# Patient Record
Sex: Female | Born: 1955
Health system: Southern US, Community
[De-identification: ages and names within clinical notes are randomized; demographics above are authoritative.]

## PROBLEM LIST (undated history)

## (undated) DIAGNOSIS — K219 Gastro-esophageal reflux disease without esophagitis: Secondary | ICD-10-CM

## (undated) DIAGNOSIS — I10 Essential (primary) hypertension: Secondary | ICD-10-CM

## (undated) DIAGNOSIS — H9193 Unspecified hearing loss, bilateral: Secondary | ICD-10-CM

## (undated) DIAGNOSIS — G629 Polyneuropathy, unspecified: Secondary | ICD-10-CM

## (undated) DIAGNOSIS — H539 Unspecified visual disturbance: Secondary | ICD-10-CM

## (undated) DIAGNOSIS — O24419 Gestational diabetes mellitus in pregnancy, unspecified control: Secondary | ICD-10-CM

## (undated) DIAGNOSIS — M25552 Pain in left hip: Secondary | ICD-10-CM

## (undated) DIAGNOSIS — G8929 Other chronic pain: Secondary | ICD-10-CM

## (undated) HISTORY — DX: Gestational diabetes mellitus in pregnancy, unspecified control: O24.419

## (undated) HISTORY — DX: Unspecified hearing loss, bilateral: H91.93

## (undated) HISTORY — DX: Essential (primary) hypertension: I10

## (undated) HISTORY — DX: Unspecified visual disturbance: H53.9

## (undated) HISTORY — DX: Polyneuropathy, unspecified: G62.9

## (undated) HISTORY — DX: Pain in left hip: M25.552

## (undated) HISTORY — DX: Other chronic pain: G89.29

## (undated) HISTORY — DX: Gastro-esophageal reflux disease without esophagitis: K21.9

---

## 1990-11-15 HISTORY — PX: LAPAROSCOPIC CHOLECYSTECTOMY: SUR755

## 1999-12-01 ENCOUNTER — Encounter: Admission: RE | Admit: 1999-12-01 | Discharge: 1999-12-01 | Payer: Self-pay | Admitting: Obstetrics and Gynecology

## 1999-12-01 ENCOUNTER — Encounter: Payer: Self-pay | Admitting: Obstetrics and Gynecology

## 1999-12-10 ENCOUNTER — Encounter: Payer: Self-pay | Admitting: Obstetrics and Gynecology

## 1999-12-10 ENCOUNTER — Encounter: Admission: RE | Admit: 1999-12-10 | Discharge: 1999-12-10 | Payer: Self-pay | Admitting: Obstetrics and Gynecology

## 2000-06-16 ENCOUNTER — Encounter: Payer: Self-pay | Admitting: Obstetrics and Gynecology

## 2000-06-16 ENCOUNTER — Encounter: Admission: RE | Admit: 2000-06-16 | Discharge: 2000-06-16 | Payer: Self-pay | Admitting: Obstetrics and Gynecology

## 2001-02-15 ENCOUNTER — Encounter: Payer: Self-pay | Admitting: Obstetrics and Gynecology

## 2001-02-15 ENCOUNTER — Encounter: Admission: RE | Admit: 2001-02-15 | Discharge: 2001-02-15 | Payer: Self-pay | Admitting: Obstetrics and Gynecology

## 2002-10-15 ENCOUNTER — Encounter: Payer: Self-pay | Admitting: Emergency Medicine

## 2002-10-15 ENCOUNTER — Emergency Department (HOSPITAL_COMMUNITY): Admission: EM | Admit: 2002-10-15 | Discharge: 2002-10-15 | Payer: Self-pay | Admitting: *Deleted

## 2002-10-19 ENCOUNTER — Encounter: Payer: Self-pay | Admitting: Obstetrics and Gynecology

## 2002-10-19 ENCOUNTER — Encounter: Admission: RE | Admit: 2002-10-19 | Discharge: 2002-10-19 | Payer: Self-pay | Admitting: Obstetrics and Gynecology

## 2002-11-15 HISTORY — PX: REFRACTIVE SURGERY: SHX103

## 2006-06-07 ENCOUNTER — Encounter: Admission: RE | Admit: 2006-06-07 | Discharge: 2006-06-07 | Payer: Self-pay | Admitting: Obstetrics and Gynecology

## 2007-06-26 ENCOUNTER — Encounter: Admission: RE | Admit: 2007-06-26 | Discharge: 2007-06-26 | Payer: Self-pay | Admitting: Orthopaedic Surgery

## 2009-01-30 ENCOUNTER — Other Ambulatory Visit: Admission: RE | Admit: 2009-01-30 | Discharge: 2009-01-30 | Payer: Self-pay | Admitting: Family Medicine

## 2010-12-06 ENCOUNTER — Encounter: Payer: Self-pay | Admitting: Family Medicine

## 2013-01-04 ENCOUNTER — Other Ambulatory Visit (HOSPITAL_COMMUNITY)
Admission: RE | Admit: 2013-01-04 | Discharge: 2013-01-04 | Disposition: A | Discharge status: Home / Self Care | Payer: BC Managed Care – PPO | Source: Ambulatory Visit | Attending: Family Medicine | Admitting: Family Medicine

## 2013-01-04 ENCOUNTER — Other Ambulatory Visit: Payer: Self-pay | Admitting: Family Medicine

## 2013-01-04 DIAGNOSIS — Z0000 Encounter for general adult medical examination without abnormal findings: Secondary | ICD-10-CM | POA: Insufficient documentation

## 2014-07-29 ENCOUNTER — Other Ambulatory Visit: Payer: Self-pay

## 2014-07-29 DIAGNOSIS — Z1231 Encounter for screening mammogram for malignant neoplasm of breast: Secondary | ICD-10-CM

## 2014-08-13 ENCOUNTER — Ambulatory Visit
Admission: RE | Admit: 2014-08-13 | Discharge: 2014-08-13 | Disposition: A | Discharge status: Home / Self Care | Payer: PRIVATE HEALTH INSURANCE | Source: Ambulatory Visit

## 2014-08-13 DIAGNOSIS — Z1231 Encounter for screening mammogram for malignant neoplasm of breast: Secondary | ICD-10-CM

## 2016-05-02 ENCOUNTER — Encounter: Payer: Self-pay | Admitting: Family Medicine

## 2016-08-02 ENCOUNTER — Other Ambulatory Visit (HOSPITAL_COMMUNITY)
Admission: RE | Admit: 2016-08-02 | Discharge: 2016-08-02 | Disposition: A | Discharge status: Home / Self Care | Payer: 59 | Source: Ambulatory Visit | Attending: Family Medicine | Admitting: Family Medicine

## 2016-08-02 ENCOUNTER — Other Ambulatory Visit: Payer: Self-pay | Admitting: Family Medicine

## 2016-08-02 DIAGNOSIS — Z01419 Encounter for gynecological examination (general) (routine) without abnormal findings: Secondary | ICD-10-CM | POA: Diagnosis not present

## 2016-08-03 LAB — CYTOLOGY - PAP

## 2017-03-09 DIAGNOSIS — J019 Acute sinusitis, unspecified: Secondary | ICD-10-CM | POA: Diagnosis not present

## 2017-08-16 ENCOUNTER — Other Ambulatory Visit: Payer: Self-pay | Admitting: Family Medicine

## 2017-08-16 DIAGNOSIS — Z1231 Encounter for screening mammogram for malignant neoplasm of breast: Secondary | ICD-10-CM

## 2017-08-29 ENCOUNTER — Ambulatory Visit
Admission: RE | Admit: 2017-08-29 | Discharge: 2017-08-29 | Disposition: A | Discharge status: Home / Self Care | Payer: 59 | Source: Ambulatory Visit | Attending: Family Medicine | Admitting: Family Medicine

## 2017-08-29 DIAGNOSIS — Z1231 Encounter for screening mammogram for malignant neoplasm of breast: Secondary | ICD-10-CM

## 2017-08-31 DIAGNOSIS — Z0000 Encounter for general adult medical examination without abnormal findings: Secondary | ICD-10-CM | POA: Diagnosis not present

## 2017-08-31 DIAGNOSIS — E782 Mixed hyperlipidemia: Secondary | ICD-10-CM | POA: Diagnosis not present

## 2017-08-31 DIAGNOSIS — K219 Gastro-esophageal reflux disease without esophagitis: Secondary | ICD-10-CM | POA: Diagnosis not present

## 2017-08-31 DIAGNOSIS — I10 Essential (primary) hypertension: Secondary | ICD-10-CM | POA: Diagnosis not present

## 2017-08-31 DIAGNOSIS — Z23 Encounter for immunization: Secondary | ICD-10-CM | POA: Diagnosis not present

## 2017-11-22 DIAGNOSIS — J208 Acute bronchitis due to other specified organisms: Secondary | ICD-10-CM | POA: Diagnosis not present

## 2017-12-11 NOTE — Telephone Encounter (Signed)
This encounter was created in error - please disregard.

## 2017-12-19 DIAGNOSIS — M5416 Radiculopathy, lumbar region: Secondary | ICD-10-CM | POA: Diagnosis not present

## 2017-12-19 DIAGNOSIS — M9905 Segmental and somatic dysfunction of pelvic region: Secondary | ICD-10-CM | POA: Diagnosis not present

## 2017-12-19 DIAGNOSIS — M9903 Segmental and somatic dysfunction of lumbar region: Secondary | ICD-10-CM | POA: Diagnosis not present

## 2017-12-21 DIAGNOSIS — M9905 Segmental and somatic dysfunction of pelvic region: Secondary | ICD-10-CM | POA: Diagnosis not present

## 2017-12-21 DIAGNOSIS — M9903 Segmental and somatic dysfunction of lumbar region: Secondary | ICD-10-CM | POA: Diagnosis not present

## 2017-12-21 DIAGNOSIS — M5416 Radiculopathy, lumbar region: Secondary | ICD-10-CM | POA: Diagnosis not present

## 2018-01-11 DIAGNOSIS — M9905 Segmental and somatic dysfunction of pelvic region: Secondary | ICD-10-CM | POA: Diagnosis not present

## 2018-01-11 DIAGNOSIS — M9903 Segmental and somatic dysfunction of lumbar region: Secondary | ICD-10-CM | POA: Diagnosis not present

## 2018-01-11 DIAGNOSIS — M5416 Radiculopathy, lumbar region: Secondary | ICD-10-CM | POA: Diagnosis not present

## 2018-01-17 DIAGNOSIS — M9905 Segmental and somatic dysfunction of pelvic region: Secondary | ICD-10-CM | POA: Diagnosis not present

## 2018-01-17 DIAGNOSIS — M5416 Radiculopathy, lumbar region: Secondary | ICD-10-CM | POA: Diagnosis not present

## 2018-01-17 DIAGNOSIS — M9903 Segmental and somatic dysfunction of lumbar region: Secondary | ICD-10-CM | POA: Diagnosis not present

## 2018-01-19 DIAGNOSIS — M9903 Segmental and somatic dysfunction of lumbar region: Secondary | ICD-10-CM | POA: Diagnosis not present

## 2018-01-19 DIAGNOSIS — M5416 Radiculopathy, lumbar region: Secondary | ICD-10-CM | POA: Diagnosis not present

## 2018-01-19 DIAGNOSIS — M9905 Segmental and somatic dysfunction of pelvic region: Secondary | ICD-10-CM | POA: Diagnosis not present

## 2018-01-26 DIAGNOSIS — M5416 Radiculopathy, lumbar region: Secondary | ICD-10-CM | POA: Diagnosis not present

## 2018-01-26 DIAGNOSIS — M9905 Segmental and somatic dysfunction of pelvic region: Secondary | ICD-10-CM | POA: Diagnosis not present

## 2018-01-26 DIAGNOSIS — M9903 Segmental and somatic dysfunction of lumbar region: Secondary | ICD-10-CM | POA: Diagnosis not present

## 2018-01-26 DIAGNOSIS — M47816 Spondylosis without myelopathy or radiculopathy, lumbar region: Secondary | ICD-10-CM | POA: Diagnosis not present

## 2018-01-30 DIAGNOSIS — M9903 Segmental and somatic dysfunction of lumbar region: Secondary | ICD-10-CM | POA: Diagnosis not present

## 2018-01-30 DIAGNOSIS — M9905 Segmental and somatic dysfunction of pelvic region: Secondary | ICD-10-CM | POA: Diagnosis not present

## 2018-01-30 DIAGNOSIS — M5416 Radiculopathy, lumbar region: Secondary | ICD-10-CM | POA: Diagnosis not present

## 2018-01-31 DIAGNOSIS — M9905 Segmental and somatic dysfunction of pelvic region: Secondary | ICD-10-CM | POA: Diagnosis not present

## 2018-01-31 DIAGNOSIS — M9903 Segmental and somatic dysfunction of lumbar region: Secondary | ICD-10-CM | POA: Diagnosis not present

## 2018-01-31 DIAGNOSIS — M5416 Radiculopathy, lumbar region: Secondary | ICD-10-CM | POA: Diagnosis not present

## 2018-02-02 DIAGNOSIS — M5416 Radiculopathy, lumbar region: Secondary | ICD-10-CM | POA: Diagnosis not present

## 2018-02-02 DIAGNOSIS — M9903 Segmental and somatic dysfunction of lumbar region: Secondary | ICD-10-CM | POA: Diagnosis not present

## 2018-02-02 DIAGNOSIS — M9905 Segmental and somatic dysfunction of pelvic region: Secondary | ICD-10-CM | POA: Diagnosis not present

## 2018-02-06 DIAGNOSIS — M9903 Segmental and somatic dysfunction of lumbar region: Secondary | ICD-10-CM | POA: Diagnosis not present

## 2018-02-06 DIAGNOSIS — M9905 Segmental and somatic dysfunction of pelvic region: Secondary | ICD-10-CM | POA: Diagnosis not present

## 2018-02-06 DIAGNOSIS — M5416 Radiculopathy, lumbar region: Secondary | ICD-10-CM | POA: Diagnosis not present

## 2018-02-07 DIAGNOSIS — M9905 Segmental and somatic dysfunction of pelvic region: Secondary | ICD-10-CM | POA: Diagnosis not present

## 2018-02-07 DIAGNOSIS — M9903 Segmental and somatic dysfunction of lumbar region: Secondary | ICD-10-CM | POA: Diagnosis not present

## 2018-02-07 DIAGNOSIS — M5416 Radiculopathy, lumbar region: Secondary | ICD-10-CM | POA: Diagnosis not present

## 2018-02-09 DIAGNOSIS — M5416 Radiculopathy, lumbar region: Secondary | ICD-10-CM | POA: Diagnosis not present

## 2018-02-09 DIAGNOSIS — M9905 Segmental and somatic dysfunction of pelvic region: Secondary | ICD-10-CM | POA: Diagnosis not present

## 2018-02-09 DIAGNOSIS — M9903 Segmental and somatic dysfunction of lumbar region: Secondary | ICD-10-CM | POA: Diagnosis not present

## 2018-02-13 DIAGNOSIS — R29898 Other symptoms and signs involving the musculoskeletal system: Secondary | ICD-10-CM | POA: Diagnosis not present

## 2018-02-13 DIAGNOSIS — R202 Paresthesia of skin: Secondary | ICD-10-CM | POA: Diagnosis not present

## 2018-02-15 ENCOUNTER — Ambulatory Visit: Payer: 59 | Admitting: Neurology

## 2018-02-15 ENCOUNTER — Encounter: Payer: Self-pay | Admitting: Neurology

## 2018-02-15 VITALS — BP 131/92 | HR 89 | Resp 18 | Ht 64.0 in | Wt 207.5 lb

## 2018-02-15 DIAGNOSIS — R159 Full incontinence of feces: Secondary | ICD-10-CM | POA: Diagnosis not present

## 2018-02-15 DIAGNOSIS — R29898 Other symptoms and signs involving the musculoskeletal system: Secondary | ICD-10-CM | POA: Diagnosis not present

## 2018-02-15 DIAGNOSIS — R5383 Other fatigue: Secondary | ICD-10-CM | POA: Diagnosis not present

## 2018-02-15 DIAGNOSIS — R260 Ataxic gait: Secondary | ICD-10-CM | POA: Insufficient documentation

## 2018-02-15 DIAGNOSIS — R200 Anesthesia of skin: Secondary | ICD-10-CM | POA: Insufficient documentation

## 2018-02-15 DIAGNOSIS — G3281 Cerebellar ataxia in diseases classified elsewhere: Secondary | ICD-10-CM

## 2018-02-15 DIAGNOSIS — R32 Unspecified urinary incontinence: Secondary | ICD-10-CM | POA: Insufficient documentation

## 2018-02-15 MED ORDER — OXYBUTYNIN CHLORIDE 5 MG PO TABS: 5.0000 mg | ORAL_TABLET | Freq: Two times a day (BID) | ORAL | 5 refills | Status: DC

## 2018-02-15 NOTE — Progress Notes (Signed)
[facility]  PATIENT: Hannah Jefferson DOB: Feb 16, 1956  REFERRING DOCTOR OR PCP:  Carol Ada, MD SOURCE: patient, notes from Dr. Tamala Julian  _________________________________   HISTORICAL  CHIEF COMPLAINT:  Chief Complaint  Patient presents with  . Numbness    Numbess from the waist down, left leg worse than right leg.  Onset November 2018.  Left hip pain with prolonged standing. Has seen a chiropractor (Dr. Jola Baptist).  Sts. Dr. Jimmye Norman did x-rays and told her she has worsening scoliosis which is causing hip pain and numbness.     HISTORY OF PRESENT ILLNESS:  I had the pleasure of seeing your patient, Hannah Jefferson, and [facility] for neurologic consultation regarding her numbness.    She is a 62 year old woman who reports a long history of numbness in her left thigh.  She began to notice numbness lower in the left leg during November 2018.   She also would have pain in the left hip when she stood for long time.  She saw her chiropractor and had x-rays performed and was told that she had worsening scoliosis that was causing her hip pain and numbness.  Symptoms have progressed and she now has numbness from the waist down bilaterally but in the left leg worse than the right leg.   Besides the numbness, she also has noted a heavy sensation in her left leg with some weakness.  She is now having more trouble going up and down the stairs for the past 2 months.    The left has been worse but she has has lesser numbness and weakness in the right leg more recently as well.   She has numbness in the waist as well and feels there is a short transition zone from normal to numb at that level.   She is stumbling and has had a couple falls, once tripping and other times due to poor balance.   She has been using a cane off and on the past couple months.    She can't walk on grass without using the cane.  She notes fluctuating numbness in her hands if she uses  the computer a lot.     She has had hip pain which has been felt due to her scoliosis.   She has had this off/on x many years, since at least 2007  She has had urinary frequency, urgency and some incontinence the past year.    This can fluctuate a lot.   She has had nocturia the past year.     She has a history of diarrhea and has had fecal incontinence.  This started in 1992 after a cholecystectomy.   She denies any change in vision.    She reports a lot more fatigue the past few months.    She feels in a cognitive fog the past few months.    She feels down the last month as she is getting worse.       She denies any significant neurologic symptom lasting a few days to a few weeks in the past.    However, she has felt clumsier the past year.     She has a histoy of a left ankle injury and fracture at age 51 and notes some stiffness there.   She has felt some difficulty climbing stairs since then.     She saw Dr. Tamala Julian last week and had laboratory testing.  She reports that they lab tests were fine that she was able  to see in the portal and that the B12 was low normal.    REVIEW OF SYSTEMS: Constitutional: No fevers, chills, sweats, or change in appetite.   She notes fatigue. Eyes: No visual changes, double vision, eye pain Ear, nose and throat: No hearing loss, ear pain, nasal congestion, sore throat Cardiovascular: No chest pain, palpitations Respiratory: No shortness of breath at rest or with exertion.   No wheezes GastrointestinaI: She has had some diarrhea and occasional fecal incontinence Genitourinary:She notes frequency, nocturia and rare incontinence.. Musculoskeletal: No neck pain, back pain Integumentary: No rash, pruritus, skin lesions Neurological: as above Psychiatric: No depression at this time.  No anxiety Endocrine: No palpitations, diaphoresis, change in appetite, change in weigh or increased thirst Hematologic/Lymphatic: No anemia, purpura,  petechiae. Allergic/Immunologic: No itchy/runny eyes, nasal congestion, recent allergic reactions, rashes  ALLERGIES: No Known Allergies  HOME MEDICATIONS:  Current Outpatient Medications:  .  lisinopril-hydrochlorothiazide (PRINZIDE,ZESTORETIC) 20-12.5 MG tablet, TK 1 T PO QD, Disp: , Rfl: 0 .  omeprazole (PRILOSEC) 20 MG capsule, Take 20 mg by mouth 2 (two) times daily before a meal., Disp: , Rfl:  .  oxybutynin (DITROPAN) 5 MG tablet, Take 1 tablet (5 mg total) by mouth 2 (two) times daily., Disp: 60 tablet, Rfl: 5  PAST MEDICAL HISTORY: Past Medical History:  Diagnosis Date  . Hypertension   . Vision abnormalities     PAST SURGICAL HISTORY: Past Surgical History:  Procedure Laterality Date  . LAPAROSCOPIC CHOLECYSTECTOMY  1992  . REFRACTIVE SURGERY Bilateral 2004    FAMILY HISTORY: Family History  Problem Relation Age of Onset  . Dementia Mother   . COPD Mother   . Heart disease Father   . Thyroid nodules Sister   . Diabetes Mellitus II Sister   . Heart disease Brother   . Diabetes Mellitus II Brother   . Diabetes Mellitus II Brother   . Heart disease Brother     SOCIAL HISTORY:  Social History   Socioeconomic History  . Marital status: Married    Spouse name: Not on file  . Number of children: Not on file  . Years of education: Not on file  . Highest education level: Not on file  Occupational History  . Not on file  Social Needs  . Financial resource strain: Not on file  . Food insecurity:    Worry: Not on file    Inability: Not on file  . Transportation needs:    Medical: Not on file    Non-medical: Not on file  Tobacco Use  . Smoking status: Never Smoker  . Smokeless tobacco: Never Used  Substance and Sexual Activity  . Alcohol use: Never    Frequency: Never  . Drug use: Never  . Sexual activity: Not on file  Lifestyle  . Physical activity:    Days per week: Not on file    Minutes per session: Not on file  . Stress: Not on file   Relationships  . Social connections:    Talks on phone: Not on file    Gets together: Not on file    Attends religious service: Not on file    Active member of club or organization: Not on file    Attends meetings of clubs or organizations: Not on file    Relationship status: Not on file  . Intimate partner violence:    Fear of current or ex partner: Not on file    Emotionally abused: Not on file    Physically  abused: Not on file    Forced sexual activity: Not on file  Other Topics Concern  . Not on file  Social History Narrative  . Not on file     PHYSICAL EXAM  Vitals:   02/15/18 1456  BP: (!) 131/92  Pulse: 89  Resp: 18  Weight: 207 lb 8 oz (94.1 kg)  Height: 5\' 4"  (1.626 m)    Body mass index is 35.62 kg/m.   General: The patient is well-developed and well-nourished and in no acute distress  Eyes:  Funduscopic exam shows normal optic discs and retinal vessels.  Neck: The neck is supple, no carotid bruits are noted.  The neck is nontender.  Cardiovascular: The heart has a regular rate and rhythm with a normal S1 and S2. There were no murmurs, gallops or rubs. Lungs are clear to auscultation.  Skin: Extremities are without significant edema.  Musculoskeletal:  Back is nontender  Neurologic Exam  Mental status: The patient is alert and oriented x 3 at the time of the examination. The patient has apparent normal recent and remote memory, with an apparently normal attention span and concentration ability.   Speech is normal.  Cranial nerves: Extraocular movements are full. Pupils are equal, round, and reactive to light and accomodation.  Visual fields are full.  Facial symmetry is present. There is good facial sensation to soft touch bilaterally.Facial strength is normal.  Trapezius and sternocleidomastoid strength is normal. No dysarthria is noted.  The tongue is midline, and the patient has symmetric elevation of the soft palate. No obvious hearing deficits are  noted.  Motor:  Muscle bulk is normal.   There was slight increased muscle tone in the left leg.  Strength was 4/5 in the toe extensors on the left and 4+/5 in the ankle extensors.  Sensory: She has intact sensation to touch temperature and vibration in the arms.  She has reduced touch sensation in the legs, more so on the left than the right.  She has increased temperature sensation on the left versus the right.  Vibration sensation is slightly reduced at the left knee versus the right and more moderately reduced at the left great toe versus the right.   SHe has a sensory level around T10 -T11 in her back.  Coordination: Cerebellar testing reveals good finger-nose-finger and heel-to-shin bilaterally.  Gait and station: Station is normal.   Gait is mildly wide.  The tandem gait requires her to hold on in order to do.  The Romberg sign is negative..   Reflexes: Deep tendon reflexes slightly increased in the left arm relative to the right arm.  Deep tendon reflexes in the legs are increased bilaterally, left greater than right.  She has spread at the left knee.  She has 2 beats of nonsustained clonus at the left ankle.   Plantar responses are flexor.    DIAGNOSTIC DATA (LABS, IMAGING, TESTING) - I reviewed patient records, labs, notes, testing and imaging myself where available.      ASSESSMENT AND PLAN  Numbness - Plan: Copper, serum  Cerebellar ataxia in diseases classified elsewhere ([place]) - Plan: Copper, serum, MR BRAIN W WO CONTRAST, MR CERVICAL SPINE W WO CONTRAST, MR THORACIC SPINE W WO CONTRAST  Ataxic gait  Left leg weakness  Other fatigue  Urinary incontinence, unspecified type  Incontinence of feces, unspecified fecal incontinence type    In summary, Hannah Jefferson is a 62 year old woman who has had left greater than right leg numbness up to her  waist that has progressed over the past 5-6 months.  She also has had gait disturbance and left leg weakness.  Additionally over  the last year she has had worsening bladder and bowel function.  On her exam, she has a sensory level around T10 or T11, left leg weakness and an ataxic gait with hyperreflexia.  I am concerned about a spinal cord process such as transverse myelitis, multiple sclerosis or compressive myelopathy.  Her symptoms have progressed over time, rather than rapidly progressing over hours to days, we need to also be concerned about tumor.  We will check an MRI of the cervical and thoracic spine as well as the brain to rule out demyelination and compressive myelopathy.   This will also allow [place] to rule out ischemic and neoplastic etiology.  I will also check a copper level as copper deficiency can also lead to numbness and gait issues.  She has had B12 checked within the past week and she tells me that the level was low normal but not low.  Therefore, it is unlikely to be playing a role.  I will add oxybutynin to see if that helps the bladder and bowel issues.  She will return to see me in 2-3 weeks and call sooner if significant new or worsening symptoms.  Further evaluation will be considered based on the results of the imaging and laboratory studies.  Thank you for asking me to see Hannah Jefferson.  Please let me know if I can be of further assistance with her or other patients in the future.  Cira Deyoe A. Felecia Shelling, MD, PhD, FAAN Certified in Neurology, Clinical Neurophysiology, Sleep Medicine, Pain Medicine and Neuroimaging Director, [facility] [address], [place] [employer] [place], [place] [address] [phone]

## 2018-02-16 ENCOUNTER — Telehealth: Payer: Self-pay | Admitting: Neurology

## 2018-02-16 NOTE — Telephone Encounter (Signed)
MRI Brain w/wo MRI Cervical w/wo and Thoracic w/wo Dr. Felecia Shelling [facility] [phone], [phone], and [phone] (04/02/18). Patient is scheduled for 02/21/18 on [facility] mobile unit.

## 2018-02-17 LAB — COPPER, SERUM: Copper: 125 ug/dL (ref 72–166)

## 2018-02-20 ENCOUNTER — Telehealth: Payer: Self-pay | Admitting: *Deleted

## 2018-02-20 NOTE — Telephone Encounter (Signed)
Spoke with Judeen Hammans and reviewed below lab results.  She verbalized understanding of same, aware we will call with MRI results once it is done/fim

## 2018-02-20 NOTE — Telephone Encounter (Signed)
-----   Message from Britt Bottom, MD sent at 02/17/2018  4:58 PM EDT ----- Please let her know that the copper blood test was fine.  We will call with the results of the MRI after they were done.

## 2018-02-21 ENCOUNTER — Ambulatory Visit: Payer: 59

## 2018-02-21 ENCOUNTER — Telehealth: Payer: Self-pay | Admitting: Neurology

## 2018-02-21 DIAGNOSIS — R260 Ataxic gait: Secondary | ICD-10-CM | POA: Diagnosis not present

## 2018-02-21 DIAGNOSIS — G3281 Cerebellar ataxia in diseases classified elsewhere: Secondary | ICD-10-CM

## 2018-02-21 DIAGNOSIS — R200 Anesthesia of skin: Secondary | ICD-10-CM | POA: Diagnosis not present

## 2018-02-21 MED ORDER — DEXAMETHASONE 4 MG PO TABS: ORAL_TABLET | ORAL | 1 refills | Status: DC

## 2018-02-21 MED ORDER — GADOPENTETATE DIMEGLUMINE 469.01 MG/ML IV SOLN
19.0000 mL | Freq: Once | INTRAVENOUS | Status: AC | PRN
  Administered 2018-02-21: 19 mL via INTRAVENOUS

## 2018-02-21 NOTE — Telephone Encounter (Signed)
I spoke with Hannah Jefferson about the MRI of the thoracic spine showing a mass consistent with a meningioma at T7 and T8 causing spinal cord compression consistent with her symptoms.     She feels she is doing the same now as she was couple weeks ago.  I will call in a prescription for Decadron and try to get her into see neurosurgery as soon as they can

## 2018-02-22 NOTE — Telephone Encounter (Signed)
Received a phone call from [place] at [facility].  Pt. is scheduled for NS consult on 03/06/18 at 4pm/fim

## 2018-02-22 NOTE — Telephone Encounter (Signed)
Dr. Felecia Shelling spoke with Dr. Sherley Bounds at [facility] this morning.  Dr. Ronnald Ramp feels pt. can be seen in the next 2 wks.  I spoke with his new pt. coordinator Denny Peon) and faxed pt's. info, including demo and ins. info, ov note and MRI reports for brain, cervical and thoracic spine. to fax# [phone].  Leah sts. she will call pt. to schedule./fim

## 2018-02-22 NOTE — Telephone Encounter (Signed)
Pt. is aware of appt/fim

## 2018-03-01 ENCOUNTER — Ambulatory Visit: Payer: 59 | Admitting: Neurology

## 2018-03-06 DIAGNOSIS — I10 Essential (primary) hypertension: Secondary | ICD-10-CM | POA: Diagnosis not present

## 2018-03-06 DIAGNOSIS — D321 Benign neoplasm of spinal meninges: Secondary | ICD-10-CM | POA: Diagnosis not present

## 2018-03-07 DIAGNOSIS — S52532A Colles' fracture of left radius, initial encounter for closed fracture: Secondary | ICD-10-CM | POA: Diagnosis not present

## 2018-03-08 DIAGNOSIS — S52532A Colles' fracture of left radius, initial encounter for closed fracture: Secondary | ICD-10-CM | POA: Diagnosis not present

## 2018-03-08 DIAGNOSIS — S52572A Other intraarticular fracture of lower end of left radius, initial encounter for closed fracture: Secondary | ICD-10-CM | POA: Diagnosis not present

## 2018-03-08 DIAGNOSIS — M24532 Contracture, left wrist: Secondary | ICD-10-CM | POA: Diagnosis not present

## 2018-03-08 DIAGNOSIS — G8918 Other acute postprocedural pain: Secondary | ICD-10-CM | POA: Diagnosis not present

## 2018-03-09 DIAGNOSIS — I10 Essential (primary) hypertension: Secondary | ICD-10-CM | POA: Diagnosis not present

## 2018-03-09 DIAGNOSIS — D321 Benign neoplasm of spinal meninges: Secondary | ICD-10-CM | POA: Diagnosis not present

## 2018-03-09 DIAGNOSIS — R9431 Abnormal electrocardiogram [ECG] [EKG]: Secondary | ICD-10-CM | POA: Diagnosis not present

## 2018-03-09 DIAGNOSIS — I444 Left anterior fascicular block: Secondary | ICD-10-CM | POA: Diagnosis not present

## 2018-03-09 DIAGNOSIS — Z01818 Encounter for other preprocedural examination: Secondary | ICD-10-CM | POA: Diagnosis not present

## 2018-03-13 DIAGNOSIS — K449 Diaphragmatic hernia without obstruction or gangrene: Secondary | ICD-10-CM | POA: Diagnosis not present

## 2018-03-13 DIAGNOSIS — I82409 Acute embolism and thrombosis of unspecified deep veins of unspecified lower extremity: Secondary | ICD-10-CM | POA: Diagnosis not present

## 2018-03-13 DIAGNOSIS — Z431 Encounter for attention to gastrostomy: Secondary | ICD-10-CM | POA: Diagnosis not present

## 2018-03-13 DIAGNOSIS — M4714 Other spondylosis with myelopathy, thoracic region: Secondary | ICD-10-CM | POA: Diagnosis not present

## 2018-03-13 DIAGNOSIS — E876 Hypokalemia: Secondary | ICD-10-CM | POA: Diagnosis not present

## 2018-03-13 DIAGNOSIS — D321 Benign neoplasm of spinal meninges: Secondary | ICD-10-CM | POA: Diagnosis not present

## 2018-03-13 DIAGNOSIS — N319 Neuromuscular dysfunction of bladder, unspecified: Secondary | ICD-10-CM | POA: Diagnosis not present

## 2018-03-13 DIAGNOSIS — C720 Malignant neoplasm of spinal cord: Secondary | ICD-10-CM | POA: Diagnosis not present

## 2018-03-13 DIAGNOSIS — G9520 Unspecified cord compression: Secondary | ICD-10-CM | POA: Diagnosis not present

## 2018-03-13 DIAGNOSIS — A499 Bacterial infection, unspecified: Secondary | ICD-10-CM | POA: Diagnosis not present

## 2018-03-13 DIAGNOSIS — K9189 Other postprocedural complications and disorders of digestive system: Secondary | ICD-10-CM | POA: Diagnosis not present

## 2018-03-13 DIAGNOSIS — N390 Urinary tract infection, site not specified: Secondary | ICD-10-CM | POA: Diagnosis not present

## 2018-03-13 DIAGNOSIS — R140 Abdominal distension (gaseous): Secondary | ICD-10-CM | POA: Diagnosis not present

## 2018-03-13 DIAGNOSIS — Z743 Need for continuous supervision: Secondary | ICD-10-CM | POA: Diagnosis not present

## 2018-03-13 DIAGNOSIS — I82492 Acute embolism and thrombosis of other specified deep vein of left lower extremity: Secondary | ICD-10-CM | POA: Diagnosis not present

## 2018-03-13 DIAGNOSIS — D62 Acute posthemorrhagic anemia: Secondary | ICD-10-CM | POA: Diagnosis not present

## 2018-03-13 DIAGNOSIS — R2689 Other abnormalities of gait and mobility: Secondary | ICD-10-CM | POA: Diagnosis not present

## 2018-03-13 DIAGNOSIS — K219 Gastro-esophageal reflux disease without esophagitis: Secondary | ICD-10-CM | POA: Diagnosis not present

## 2018-03-13 DIAGNOSIS — G8220 Paraplegia, unspecified: Secondary | ICD-10-CM | POA: Diagnosis not present

## 2018-03-13 DIAGNOSIS — E871 Hypo-osmolality and hyponatremia: Secondary | ICD-10-CM | POA: Diagnosis not present

## 2018-03-13 DIAGNOSIS — R279 Unspecified lack of coordination: Secondary | ICD-10-CM | POA: Diagnosis not present

## 2018-03-13 DIAGNOSIS — K567 Ileus, unspecified: Secondary | ICD-10-CM | POA: Diagnosis not present

## 2018-03-13 DIAGNOSIS — K560 Paralytic ileus: Secondary | ICD-10-CM | POA: Diagnosis not present

## 2018-03-13 DIAGNOSIS — I82491 Acute embolism and thrombosis of other specified deep vein of right lower extremity: Secondary | ICD-10-CM | POA: Diagnosis not present

## 2018-03-13 DIAGNOSIS — G959 Disease of spinal cord, unspecified: Secondary | ICD-10-CM | POA: Diagnosis not present

## 2018-03-13 DIAGNOSIS — K592 Neurogenic bowel, not elsewhere classified: Secondary | ICD-10-CM | POA: Diagnosis not present

## 2018-03-13 DIAGNOSIS — G9519 Other vascular myelopathies: Secondary | ICD-10-CM | POA: Diagnosis not present

## 2018-03-13 DIAGNOSIS — S6292XA Unspecified fracture of left wrist and hand, initial encounter for closed fracture: Secondary | ICD-10-CM | POA: Diagnosis not present

## 2018-03-13 DIAGNOSIS — D497 Neoplasm of unspecified behavior of endocrine glands and other parts of nervous system: Secondary | ICD-10-CM | POA: Diagnosis not present

## 2018-03-13 DIAGNOSIS — I82411 Acute embolism and thrombosis of right femoral vein: Secondary | ICD-10-CM | POA: Diagnosis not present

## 2018-03-13 DIAGNOSIS — I82441 Acute embolism and thrombosis of right tibial vein: Secondary | ICD-10-CM | POA: Diagnosis not present

## 2018-03-13 DIAGNOSIS — S62102S Fracture of unspecified carpal bone, left wrist, sequela: Secondary | ICD-10-CM | POA: Diagnosis not present

## 2018-03-13 DIAGNOSIS — Z86718 Personal history of other venous thrombosis and embolism: Secondary | ICD-10-CM | POA: Diagnosis not present

## 2018-03-20 ENCOUNTER — Encounter: Payer: Self-pay | Admitting: *Deleted

## 2018-03-20 NOTE — PMR Pre-admission (Signed)
[facility] PMR Admission Coordinator Pre-Admission Assessment  Patient: Hannah Jefferson is an 62 y.o., female MRN: [id number] DOB: October 11, 1956 Height: 5' 4" (162.6 cm) Weight: 93.4 kg (206 lb)  Insurance Information HMO:     PPO:       PCP:       IPA:       80/20:       OTHER:  [employer] Plus PRIMARY: UHC      Policy#: [id number]      Subscriber:  [name] CM Name: Vevelyn Royals      Phone#: [phone]     Fax#: [phone] Pre-Cert#: [id number] with update on 7th day     Employer:  Office manager Benefits:  Phone #: [phone]     Name:  On line Eff. Date: 11/15/17     Deduct:  $3500 (met $3500)      Out of Pocket Max: $5000 (met $3900.25)      Life Max: N/A CIR: $500 per admission and then 80%      SNF: 80% with 60 days max Outpatient: 20 visit limit     Co-Pay: $25/visit Home Health: 80%      Co-Pay: 20% DME: 80%     Co-Pay: 20% Providers: in network  Medicaid Application Date:       Case Manager:  Disability Application Date:       Case Worker:   Emergency Contact Information Contact Information    Name Relation Home Work Mobile   [place] J Spouse   Faith, Onley Relative [phone]  [phone]      Current Medical History  Patient Admitting Diagnosis: 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 Sherley Bounds.  Patient reported numbness in lower extremities since 10/18 worse on the left.  She reported that numbness from waist down has gotten worse; legs felt heavy; difficulty walking; balance issues and weakness.  She fell 03/05/18 and broke her wrist; underwent ORIF by Dr. Caralyn Guile.  She underwent T7-8 laminectomies for removal of intradural extramedullary tumor on 03/13/18.  She developed a post op ileus and had an NG tube.  Peroneal DVT was noted on 03/16/18 and Lovenox was  started.  She failed voiding trial and required replacement of foley catheter.  PT/OT evaluations were completed with ongoing therapies and recommendations for inpatient rehab admission.  Patient to be admitted today for acute inpatient rehab program.  Patient's medical record from  [facility] has been reviewed by the rehabilitation admission coordinator and physician.  Past Medical History  Past Medical History:  Diagnosis Date  . Hypertension   . Vision abnormalities     Family History   family history includes COPD in her mother; Dementia in her mother; Diabetes Mellitus II in her brother, brother, and sister; Heart disease in her brother, brother, and father; Thyroid nodules in her sister.  Prior Rehab/Hospitalizations Has the patient had major surgery during 100 days prior to admission? Yes.  Patient had a fall resulting in a left wrist fracture and underwent ORIF to left wrist recently.   Current Medications See MAR from [facility]  Patients Current Diet:  Regular diet, thin liquids  Precautions / Restrictions Precautions Precautions: Fall Precautions/Special Needs: Weight Bearing Status Precaution Comments: LUE NWB in splint Restrictions Weight Bearing Restrictions: Yes LUE Weight Bearing: Non weight bearing  Has the patient had 2 or more falls or a fall with injury in the past year?Yes Reports at least five falls with most recent fall resulting in left wrist fracture.   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  Went out daily.  Prior Functional Level Self Care: Did the patient need help bathing, dressing, using the toilet or eating?  Independent  Indoor Mobility: Did the patient need assistance with walking from room to room (with or without device)? Independent  Stairs: Did the patient need assistance with internal or external stairs (with or without device)? Independent  Functional  Cognition: Did the patient need help planning regular tasks such as shopping or remembering to take medications? [place] Devices/Equipment: Cane (specify quad or straight), Wheelchair, Other (Comment)(Shower chair.)  Prior Device Use: Indicate devices/aids used by the patient prior to current illness, exacerbation or injury? Manual wheelchair and Cane   Prior Functional Level Current Functional Level  Bed Mobility  Independent  Max assist(+2 assist)   Transfers  Independent  Max assist(+2 assist)   Mobility - Walk/Wheelchair  Independent  Max assist(+2 assist.)   Upper Body Dressing  Independent  Mod assist   Lower Body Dressing  Independent  Total assist   Grooming  Independent  Min assist   Eating/Drinking  Independent  Other(Set up)   Toilet Transfer  Independent  Total assist(+2 assist)   Bladder Continence   Incontinence at times  Urinary retention, incontinence, catheter removed 03/20/18   Bowel Management  WDL  Loose stools all night 03/19/18 into am 03/20/18   Stair Climbing     Other(Not tried.)   Communication  WDL  Verbal   Memory  WDL  WDL   Cooking/Meal Prep  Independent      Housework  Independent    Money Management  Independent    Driving         Special needs/care consideration BiPAP/CPAP No CPM No Continuous Drip IV No Dialysis No     Life Vest No Oxygen No Special Bed No Trach Size No Wound Vac (area) No     Skin Has a cast from left hand to above left elbow; incision with dressing posterior thoracic post op area; has 2 skin tears on back with are dressed                             Bowel mgmt: Last BM loose on 03/20/18 Bladder mgmt: Urinary retention, catheter out today, may need in and out caths Diabetic mgmt No  Previous Home Environment Living Arrangements: Spouse/significant other  Lives With: Spouse Available Help at Discharge: Family, Friend(s),  Available 24 hours/day Type of Home: House Home Layout: Two level, Able to live on main level with bedroom/bathroom Alternate Level Stairs-Number of Steps: Flight to lower level apartment where nephew lives Home Access: Ramped entrance [employer] Shower/Tub: Gaffer, Door [employer] Toilet: Standard Bathroom Accessibility: Yes How Accessible: Accessible via walker  Discharge Living Setting Plans for Discharge Living Setting: House, Lives with (comment)(Lives with husband.) Type of Home at Discharge: House Discharge Home Layout: Two level, Able to live on main level with bedroom/bathroom Alternate Level Stairs-Number of Steps: Flight to apartment on lower level where nephew lives. Discharge Home Access: Ramped entrance Discharge Bathroom Shower/Tub: Walk-in shower, Door Discharge Bathroom Toilet: Standard Discharge Bathroom Accessibility: Yes How Accessible: Accessible via walker Does the patient have any  problems obtaining your medications?: No  Social/Family/Support Systems Patient Roles: Spouse, Other (Comment)(Has husband and sister-in-law.) Contact Information: Ulice Brilliant - sister in law Anticipated Caregiver: Husband and sister in law Anticipated Caregiver's Contact Information: [name] - sister in law - (c) [phone] Ability/Limitations of Caregiver: Husband works days.  [name] can assist during the day as she is retired.  Has friends who can also assist while husband works. Caregiver Availability: 24/7 Discharge Plan Discussed with Primary Caregiver: Yes Is Caregiver In Agreement with Plan?: Yes Does Caregiver/Family have Issues with Lodging/Transportation while Pt is in Rehab?: No  Goals/Additional Needs Patient/Family Goal for Rehab: PT/OT supervision to min assist goals Expected length of stay: 15-19 days Cultural Considerations: [place] Dietary Needs: Regular diet, thin liquids Equipment Needs: TBD Pt/Family Agrees to Admission and willing to participate: Yes Program  Orientation Provided & Reviewed with Pt/Caregiver Including Roles  & Responsibilities: Yes  Barriers to Discharge: Decreased caregiver support, Neurogenic Bowel & Bladder, Weight bearing restrictions  Patient Condition: I have reviewed all clinical records [facility] and I have talked with patient by way of her sister-in-law.  Patient can tolerate and benefit from 3 hours of therapy a day.  Patient needs the coordiated approach of the rehab team in order to regain loss of function and regain her independence.  I have shared all information with rehab MD and have approval for acute inpatient rehab admission for today.  Preadmission Screen Completed By:  Retta Diones, 03/20/2018 12:17 PM ______________________________________________________________________   Discussed status with Dr. Naaman Plummer on 03/21/18 at 1210 and received telephone approval for admission today.  Admission Coordinator:  Retta Diones, time 1210/Date 03/21/18   Assessment/Plan: Diagnosis:T7-8 tumor, wrist fracture 1. Does the need for close, 24 hr/day  Medical supervision in concert with the patient's rehab needs make it unreasonable for this patient to be served in a less intensive setting? Yes 2. Co-Morbidities requiring supervision/potential complications: pain, wound care 3. Due to bladder management, bowel management, safety, skin/wound care, disease management, medication administration, pain management and patient education, does the patient require 24 hr/day rehab nursing? Yes 4. Does the patient require coordinated care of a physician, rehab nurse, PT (1-2 hrs/day, 5 days/week) and OT (1-2 hrs/day, 5 days/week) to address physical and functional deficits in the context of the above medical diagnosis(es)? Yes Addressing deficits in the following areas: balance, endurance, locomotion, strength, transferring, bowel/bladder control, bathing, dressing, feeding, grooming, toileting and psychosocial support 5. Can the patient actively  participate in an intensive therapy program of at least 3 hrs of therapy 5 days a week? Yes 6. The potential for patient to make measurable gains while on inpatient rehab is excellent 7. Anticipated functional outcomes upon discharge from inpatients are: supervision and min assist PT, supervision and min assist OT, n/a SLP 8. Estimated rehab length of stay to reach the above functional goals is: 15-19 days 9. Does the patient have adequate social supports to accommodate these discharge functional goals? Yes 10. Anticipated D/C setting: Home 11. Anticipated post D/C treatments: HH therapy and Outpatient therapy 12. Overall Rehab/Functional Prognosis: excellent    RECOMMENDATIONS: This patient's condition is appropriate for continued rehabilitative care in the following setting: CIR Patient has agreed to participate in recommended program. Yes Note that insurance prior authorization may be required for reimbursement for recommended care.  Comment: Admit to inpatient rehab today  Meredith Staggers, MD, [facility] 03/21/2018   Retta Diones 03/20/2018

## 2018-03-21 ENCOUNTER — Inpatient Hospital Stay (HOSPITAL_COMMUNITY): Payer: 59

## 2018-03-21 ENCOUNTER — Other Ambulatory Visit (HOSPITAL_COMMUNITY): Payer: Self-pay | Admitting: Physical Medicine and Rehabilitation

## 2018-03-21 ENCOUNTER — Inpatient Hospital Stay (HOSPITAL_COMMUNITY)
Admission: RE | Admit: 2018-03-21 | Discharge: 2018-04-28 | DRG: 041 | Disposition: A | Discharge status: Skilled Nursing Facility | Payer: 59 | Source: Other Acute Inpatient Hospital | Attending: Physical Medicine & Rehabilitation | Admitting: Physical Medicine & Rehabilitation

## 2018-03-21 ENCOUNTER — Encounter (HOSPITAL_COMMUNITY): Payer: Self-pay | Admitting: Physical Medicine and Rehabilitation

## 2018-03-21 DIAGNOSIS — R1084 Generalized abdominal pain: Secondary | ICD-10-CM | POA: Diagnosis not present

## 2018-03-21 DIAGNOSIS — R112 Nausea with vomiting, unspecified: Secondary | ICD-10-CM | POA: Diagnosis not present

## 2018-03-21 DIAGNOSIS — B9620 Unspecified Escherichia coli [E. coli] as the cause of diseases classified elsewhere: Secondary | ICD-10-CM | POA: Diagnosis present

## 2018-03-21 DIAGNOSIS — K56609 Unspecified intestinal obstruction, unspecified as to partial versus complete obstruction: Secondary | ICD-10-CM

## 2018-03-21 DIAGNOSIS — R933 Abnormal findings on diagnostic imaging of other parts of digestive tract: Secondary | ICD-10-CM | POA: Diagnosis not present

## 2018-03-21 DIAGNOSIS — I82491 Acute embolism and thrombosis of other specified deep vein of right lower extremity: Secondary | ICD-10-CM | POA: Diagnosis present

## 2018-03-21 DIAGNOSIS — D62 Acute posthemorrhagic anemia: Secondary | ICD-10-CM

## 2018-03-21 DIAGNOSIS — K560 Paralytic ileus: Secondary | ICD-10-CM | POA: Diagnosis present

## 2018-03-21 DIAGNOSIS — R5383 Other fatigue: Secondary | ICD-10-CM | POA: Diagnosis present

## 2018-03-21 DIAGNOSIS — H9193 Unspecified hearing loss, bilateral: Secondary | ICD-10-CM | POA: Diagnosis present

## 2018-03-21 DIAGNOSIS — E876 Hypokalemia: Secondary | ICD-10-CM | POA: Diagnosis not present

## 2018-03-21 DIAGNOSIS — R2689 Other abnormalities of gait and mobility: Secondary | ICD-10-CM | POA: Diagnosis not present

## 2018-03-21 DIAGNOSIS — I10 Essential (primary) hypertension: Secondary | ICD-10-CM | POA: Diagnosis present

## 2018-03-21 DIAGNOSIS — M4714 Other spondylosis with myelopathy, thoracic region: Secondary | ICD-10-CM | POA: Diagnosis not present

## 2018-03-21 DIAGNOSIS — K219 Gastro-esophageal reflux disease without esophagitis: Secondary | ICD-10-CM | POA: Diagnosis present

## 2018-03-21 DIAGNOSIS — R279 Unspecified lack of coordination: Secondary | ICD-10-CM | POA: Diagnosis not present

## 2018-03-21 DIAGNOSIS — I82409 Acute embolism and thrombosis of unspecified deep veins of unspecified lower extremity: Secondary | ICD-10-CM | POA: Diagnosis not present

## 2018-03-21 DIAGNOSIS — S62102S Fracture of unspecified carpal bone, left wrist, sequela: Secondary | ICD-10-CM

## 2018-03-21 DIAGNOSIS — R159 Full incontinence of feces: Secondary | ICD-10-CM | POA: Diagnosis not present

## 2018-03-21 DIAGNOSIS — G992 Myelopathy in diseases classified elsewhere: Secondary | ICD-10-CM | POA: Diagnosis not present

## 2018-03-21 DIAGNOSIS — G959 Disease of spinal cord, unspecified: Secondary | ICD-10-CM | POA: Diagnosis present

## 2018-03-21 DIAGNOSIS — M2550 Pain in unspecified joint: Secondary | ICD-10-CM | POA: Diagnosis not present

## 2018-03-21 DIAGNOSIS — S52612K Displaced fracture of left ulna styloid process, subsequent encounter for closed fracture with nonunion: Secondary | ICD-10-CM | POA: Diagnosis not present

## 2018-03-21 DIAGNOSIS — D696 Thrombocytopenia, unspecified: Secondary | ICD-10-CM | POA: Diagnosis not present

## 2018-03-21 DIAGNOSIS — G8220 Paraplegia, unspecified: Secondary | ICD-10-CM | POA: Diagnosis present

## 2018-03-21 DIAGNOSIS — N390 Urinary tract infection, site not specified: Secondary | ICD-10-CM | POA: Diagnosis not present

## 2018-03-21 DIAGNOSIS — Z833 Family history of diabetes mellitus: Secondary | ICD-10-CM | POA: Diagnosis not present

## 2018-03-21 DIAGNOSIS — E871 Hypo-osmolality and hyponatremia: Secondary | ICD-10-CM | POA: Diagnosis not present

## 2018-03-21 DIAGNOSIS — R52 Pain, unspecified: Secondary | ICD-10-CM

## 2018-03-21 DIAGNOSIS — Z86718 Personal history of other venous thrombosis and embolism: Secondary | ICD-10-CM

## 2018-03-21 DIAGNOSIS — Z743 Need for continuous supervision: Secondary | ICD-10-CM | POA: Diagnosis not present

## 2018-03-21 DIAGNOSIS — R079 Chest pain, unspecified: Secondary | ICD-10-CM

## 2018-03-21 DIAGNOSIS — K592 Neurogenic bowel, not elsewhere classified: Secondary | ICD-10-CM

## 2018-03-21 DIAGNOSIS — K449 Diaphragmatic hernia without obstruction or gangrene: Secondary | ICD-10-CM | POA: Diagnosis not present

## 2018-03-21 DIAGNOSIS — N319 Neuromuscular dysfunction of bladder, unspecified: Secondary | ICD-10-CM | POA: Diagnosis not present

## 2018-03-21 DIAGNOSIS — E1142 Type 2 diabetes mellitus with diabetic polyneuropathy: Secondary | ICD-10-CM | POA: Diagnosis present

## 2018-03-21 DIAGNOSIS — K9189 Other postprocedural complications and disorders of digestive system: Secondary | ICD-10-CM | POA: Diagnosis not present

## 2018-03-21 DIAGNOSIS — S52352D Displaced comminuted fracture of shaft of radius, left arm, subsequent encounter for closed fracture with routine healing: Secondary | ICD-10-CM | POA: Diagnosis not present

## 2018-03-21 DIAGNOSIS — I82441 Acute embolism and thrombosis of right tibial vein: Secondary | ICD-10-CM | POA: Diagnosis present

## 2018-03-21 DIAGNOSIS — R140 Abdominal distension (gaseous): Secondary | ICD-10-CM | POA: Diagnosis not present

## 2018-03-21 DIAGNOSIS — Z7901 Long term (current) use of anticoagulants: Secondary | ICD-10-CM

## 2018-03-21 DIAGNOSIS — S52352S Displaced comminuted fracture of shaft of radius, left arm, sequela: Secondary | ICD-10-CM | POA: Diagnosis not present

## 2018-03-21 DIAGNOSIS — K567 Ileus, unspecified: Secondary | ICD-10-CM

## 2018-03-21 DIAGNOSIS — F329 Major depressive disorder, single episode, unspecified: Secondary | ICD-10-CM | POA: Diagnosis present

## 2018-03-21 DIAGNOSIS — R55 Syncope and collapse: Secondary | ICD-10-CM | POA: Diagnosis not present

## 2018-03-21 DIAGNOSIS — I82492 Acute embolism and thrombosis of other specified deep vein of left lower extremity: Secondary | ICD-10-CM

## 2018-03-21 DIAGNOSIS — Z452 Encounter for adjustment and management of vascular access device: Secondary | ICD-10-CM | POA: Diagnosis not present

## 2018-03-21 DIAGNOSIS — Z9049 Acquired absence of other specified parts of digestive tract: Secondary | ICD-10-CM

## 2018-03-21 DIAGNOSIS — Z09 Encounter for follow-up examination after completed treatment for conditions other than malignant neoplasm: Secondary | ICD-10-CM

## 2018-03-21 DIAGNOSIS — I739 Peripheral vascular disease, unspecified: Secondary | ICD-10-CM | POA: Diagnosis not present

## 2018-03-21 DIAGNOSIS — R109 Unspecified abdominal pain: Secondary | ICD-10-CM | POA: Diagnosis not present

## 2018-03-21 DIAGNOSIS — R143 Flatulence: Secondary | ICD-10-CM | POA: Diagnosis not present

## 2018-03-21 DIAGNOSIS — S52502A Unspecified fracture of the lower end of left radius, initial encounter for closed fracture: Secondary | ICD-10-CM

## 2018-03-21 DIAGNOSIS — O2230 Deep phlebothrombosis in pregnancy, unspecified trimester: Secondary | ICD-10-CM

## 2018-03-21 DIAGNOSIS — Z4682 Encounter for fitting and adjustment of non-vascular catheter: Secondary | ICD-10-CM | POA: Diagnosis not present

## 2018-03-21 DIAGNOSIS — S52592A Other fractures of lower end of left radius, initial encounter for closed fracture: Secondary | ICD-10-CM | POA: Diagnosis not present

## 2018-03-21 DIAGNOSIS — Z7401 Bed confinement status: Secondary | ICD-10-CM | POA: Diagnosis not present

## 2018-03-21 DIAGNOSIS — I82411 Acute embolism and thrombosis of right femoral vein: Secondary | ICD-10-CM | POA: Diagnosis not present

## 2018-03-21 DIAGNOSIS — I2699 Other pulmonary embolism without acute cor pulmonale: Secondary | ICD-10-CM | POA: Diagnosis not present

## 2018-03-21 DIAGNOSIS — A499 Bacterial infection, unspecified: Secondary | ICD-10-CM | POA: Diagnosis not present

## 2018-03-21 DIAGNOSIS — F419 Anxiety disorder, unspecified: Secondary | ICD-10-CM | POA: Diagnosis present

## 2018-03-21 LAB — URINALYSIS, ROUTINE W REFLEX MICROSCOPIC
Bilirubin Urine: NEGATIVE
Glucose, UA: NEGATIVE mg/dL
Ketones, ur: NEGATIVE mg/dL
Nitrite: POSITIVE — AB
Protein, ur: NEGATIVE mg/dL
Specific Gravity, Urine: 1.017 (ref 1.005–1.030)
pH: 5 (ref 5.0–8.0)

## 2018-03-21 LAB — MRSA PCR SCREENING: MRSA by PCR: NEGATIVE

## 2018-03-21 MED ORDER — HYDROCODONE-ACETAMINOPHEN 5-325 MG PO TABS
1.00 | ORAL_TABLET | ORAL | Status: DC
Start: ? — End: 2018-03-21

## 2018-03-21 MED ORDER — GENERIC EXTERNAL MEDICATION
Status: DC
Start: 2018-03-22 — End: 2018-03-21

## 2018-03-21 MED ORDER — DIPHENHYDRAMINE HCL 12.5 MG/5ML PO ELIX: 12.5000 mg | ORAL_SOLUTION | Freq: Four times a day (QID) | ORAL | Status: DC | PRN

## 2018-03-21 MED ORDER — ACETAMINOPHEN 325 MG PO TABS
325.0000 mg | ORAL_TABLET | ORAL | Status: DC | PRN
  Administered 2018-03-23 – 2018-04-26 (×15): 650 mg via ORAL
  Filled 2018-03-21 (×21): qty 2
  Filled 2018-03-21: qty 1

## 2018-03-21 MED ORDER — GENERIC EXTERNAL MEDICATION
Status: DC
Start: ? — End: 2018-03-21

## 2018-03-21 MED ORDER — DEXAMETHASONE 2 MG PO TABS
1.0000 mg | ORAL_TABLET | Freq: Two times a day (BID) | ORAL | Status: DC
  Administered 2018-03-24: 1 mg via ORAL
  Filled 2018-03-21: qty 1

## 2018-03-21 MED ORDER — GUAIFENESIN-DM 100-10 MG/5ML PO SYRP: 5.0000 mL | ORAL_SOLUTION | Freq: Four times a day (QID) | ORAL | Status: DC | PRN

## 2018-03-21 MED ORDER — HYDROCHLOROTHIAZIDE 12.5 MG PO CAPS
12.5000 mg | ORAL_CAPSULE | Freq: Every day | ORAL | Status: DC
  Administered 2018-03-23: 12.5 mg via ORAL
  Filled 2018-03-21 (×2): qty 1

## 2018-03-21 MED ORDER — TAMSULOSIN HCL 0.4 MG PO CAPS
0.40 | ORAL_CAPSULE | ORAL | Status: DC
Start: 2018-03-22 — End: 2018-03-21

## 2018-03-21 MED ORDER — PHENOL 1.4 % MT LIQD
1.00 | OROMUCOSAL | Status: DC
Start: ? — End: 2018-03-21

## 2018-03-21 MED ORDER — ALUMINUM-MAGNESIUM-SIMETHICONE 200-200-20 MG/5ML PO SUSP
30.00 | ORAL | Status: DC
Start: ? — End: 2018-03-21

## 2018-03-21 MED ORDER — VITAMIN B-6 100 MG PO TABS
200.0000 mg | ORAL_TABLET | Freq: Every day | ORAL | Status: DC
  Administered 2018-03-22 – 2018-04-20 (×18): 200 mg via ORAL
  Filled 2018-03-21 (×31): qty 2

## 2018-03-21 MED ORDER — ACETAMINOPHEN 650 MG/20.3ML PO SOLN
500.00 | ORAL | Status: DC
Start: ? — End: 2018-03-21

## 2018-03-21 MED ORDER — DEXAMETHASONE 2 MG PO TABS
2.0000 mg | ORAL_TABLET | Freq: Three times a day (TID) | ORAL | Status: AC
  Administered 2018-03-22 (×3): 2 mg via ORAL
  Filled 2018-03-21 (×3): qty 1

## 2018-03-21 MED ORDER — LISINOPRIL 20 MG PO TABS
20.0000 mg | ORAL_TABLET | Freq: Every day | ORAL | Status: DC
  Administered 2018-03-22 – 2018-04-25 (×21): 20 mg via ORAL
  Filled 2018-03-21 (×27): qty 1

## 2018-03-21 MED ORDER — LIDOCAINE HCL URETHRAL/MUCOSAL 2 % EX GEL
CUTANEOUS | Status: DC | PRN
  Filled 2018-03-21: qty 30

## 2018-03-21 MED ORDER — PANTOPRAZOLE SODIUM 20 MG PO TBEC
20.0000 mg | DELAYED_RELEASE_TABLET | Freq: Two times a day (BID) | ORAL | Status: DC
  Administered 2018-03-21 – 2018-03-24 (×6): 20 mg via ORAL
  Filled 2018-03-21 (×6): qty 1

## 2018-03-21 MED ORDER — GABAPENTIN 300 MG PO CAPS
300.00 | ORAL_CAPSULE | ORAL | Status: DC
Start: 2018-03-21 — End: 2018-03-21

## 2018-03-21 MED ORDER — ALUM & MAG HYDROXIDE-SIMETH 200-200-20 MG/5ML PO SUSP
30.0000 mL | ORAL | Status: DC | PRN
  Administered 2018-03-22: 30 mL via ORAL
  Filled 2018-03-21 (×2): qty 30

## 2018-03-21 MED ORDER — ENOXAPARIN SODIUM 40 MG/0.4ML ~~LOC~~ SOLN
40.00 | SUBCUTANEOUS | Status: DC
Start: 2018-03-22 — End: 2018-03-21

## 2018-03-21 MED ORDER — HYDROCODONE-ACETAMINOPHEN 5-325 MG PO TABS
2.00 | ORAL_TABLET | ORAL | Status: DC
Start: ? — End: 2018-03-21

## 2018-03-21 MED ORDER — METHOCARBAMOL 500 MG PO TABS
500.00 | ORAL_TABLET | ORAL | Status: DC
Start: 2018-03-21 — End: 2018-03-21

## 2018-03-21 MED ORDER — DEXAMETHASONE 2 MG PO TABS
3.0000 mg | ORAL_TABLET | Freq: Two times a day (BID) | ORAL | Status: AC
  Administered 2018-03-21: 3 mg via ORAL
  Filled 2018-03-21: qty 2

## 2018-03-21 MED ORDER — DEXTROSE 10 % IV SOLN
125.00 | INTRAVENOUS | Status: DC
Start: ? — End: 2018-03-21

## 2018-03-21 MED ORDER — GENERIC EXTERNAL MEDICATION
500.00 | Status: DC
Start: ? — End: 2018-03-21

## 2018-03-21 MED ORDER — BENZOCAINE-MENTHOL 6-10 MG MT LOZG
1.00 | LOZENGE | OROMUCOSAL | Status: DC
Start: ? — End: 2018-03-21

## 2018-03-21 MED ORDER — POLYETHYLENE GLYCOL 3350 17 G PO PACK
17.0000 g | PACK | Freq: Every day | ORAL | Status: DC | PRN
  Administered 2018-04-13 – 2018-04-27 (×4): 17 g via ORAL
  Filled 2018-03-21 (×3): qty 1

## 2018-03-21 MED ORDER — VITAMIN C 500 MG PO TABS
500.0000 mg | ORAL_TABLET | Freq: Two times a day (BID) | ORAL | Status: DC
  Administered 2018-03-21 – 2018-04-20 (×39): 500 mg via ORAL
  Filled 2018-03-21 (×46): qty 1

## 2018-03-21 MED ORDER — INSULIN LISPRO 100 UNIT/ML ~~LOC~~ SOLN
2.00 | SUBCUTANEOUS | Status: DC
Start: 2018-03-21 — End: 2018-03-21

## 2018-03-21 MED ORDER — BISACODYL 10 MG RE SUPP
10.0000 mg | Freq: Every day | RECTAL | Status: DC | PRN
  Administered 2018-04-14: 10 mg via RECTAL
  Filled 2018-03-21: qty 1

## 2018-03-21 MED ORDER — METHOCARBAMOL 500 MG PO TABS
500.0000 mg | ORAL_TABLET | Freq: Two times a day (BID) | ORAL | Status: DC
  Administered 2018-03-21 – 2018-04-25 (×50): 500 mg via ORAL
  Filled 2018-03-21 (×53): qty 1

## 2018-03-21 MED ORDER — PROCHLORPERAZINE EDISYLATE 10 MG/2ML IJ SOLN
5.0000 mg | Freq: Four times a day (QID) | INTRAMUSCULAR | Status: DC | PRN
  Administered 2018-04-14: 10 mg via INTRAMUSCULAR
  Filled 2018-03-21 (×2): qty 2

## 2018-03-21 MED ORDER — TAMSULOSIN HCL 0.4 MG PO CAPS
0.4000 mg | ORAL_CAPSULE | Freq: Every day | ORAL | Status: DC
  Administered 2018-03-21 – 2018-04-27 (×24): 0.4 mg via ORAL
  Filled 2018-03-21 (×26): qty 1

## 2018-03-21 MED ORDER — DEXAMETHASONE 2 MG PO TABS
1.0000 mg | ORAL_TABLET | Freq: Three times a day (TID) | ORAL | Status: AC
  Administered 2018-03-23 (×3): 1 mg via ORAL
  Filled 2018-03-21 (×3): qty 1

## 2018-03-21 MED ORDER — ENOXAPARIN SODIUM 40 MG/0.4ML ~~LOC~~ SOLN
40.0000 mg | SUBCUTANEOUS | Status: DC
  Administered 2018-03-21 – 2018-03-24 (×4): 40 mg via SUBCUTANEOUS
  Filled 2018-03-21 (×4): qty 0.4

## 2018-03-21 MED ORDER — SENNOSIDES 8.6 MG PO TABS
17.20 | ORAL_TABLET | ORAL | Status: DC
Start: 2018-03-21 — End: 2018-03-21

## 2018-03-21 MED ORDER — PROCHLORPERAZINE 25 MG RE SUPP
12.5000 mg | Freq: Four times a day (QID) | RECTAL | Status: DC | PRN
  Administered 2018-03-30 – 2018-04-16 (×3): 12.5 mg via RECTAL
  Filled 2018-03-21 (×3): qty 1

## 2018-03-21 MED ORDER — PROCHLORPERAZINE MALEATE 5 MG PO TABS
5.0000 mg | ORAL_TABLET | Freq: Four times a day (QID) | ORAL | Status: DC | PRN
  Administered 2018-04-09: 5 mg via ORAL
  Administered 2018-04-20 (×2): 10 mg via ORAL
  Administered 2018-04-28: 5 mg via ORAL
  Filled 2018-03-21 (×3): qty 2
  Filled 2018-03-21: qty 1
  Filled 2018-03-21: qty 2

## 2018-03-21 MED ORDER — TRAZODONE HCL 50 MG PO TABS: 25.0000 mg | ORAL_TABLET | Freq: Every evening | ORAL | Status: DC | PRN

## 2018-03-21 MED ORDER — ONDANSETRON 4 MG PO TBDP
4.00 | ORAL_TABLET | ORAL | Status: DC
Start: ? — End: 2018-03-21

## 2018-03-21 MED ORDER — PANTOPRAZOLE SODIUM 40 MG PO TBEC
40.00 | DELAYED_RELEASE_TABLET | ORAL | Status: DC
Start: 2018-03-22 — End: 2018-03-21

## 2018-03-21 MED ORDER — FLEET ENEMA 7-19 GM/118ML RE ENEM: 1.0000 | ENEMA | Freq: Once | RECTAL | Status: DC | PRN

## 2018-03-21 NOTE — Progress Notes (Signed)
Orthopedic Tech Progress Note Patient Details:  Hannah Jefferson 1955/12/16 [id number]  Ortho Devices Type of Ortho Device: Prafo boot/shoe Ortho Device/Splint Location: bilateral Ortho Device/Splint Interventions: Application   Post Interventions Patient Tolerated: Well Instructions Provided: Care of device   Hildred Priest 03/21/2018, 3:57 PM

## 2018-03-21 NOTE — Progress Notes (Signed)
Discussed bowel management with Algis Liming, PA with no new orders at this time until KUB is complete.

## 2018-03-21 NOTE — Progress Notes (Signed)
Physical Medicine and Rehabilitation Admission H&P    CC: Thoracic myelopathy   HPI: Hannah Jefferson is a 62 year old female with history of hypertension, gait disorder with falls weakness and decreased sensation left lower extremity greater than right lower extremity since October 2018.  She has had decline in mobility requiring cane and was continent of bowel and bladder. Work-up revealed T7-T8 mass with marked cord compression and large enhancing tail emanating from dural based mass extending around the cord.  Mass felt to be a meningioma and she was started on decadron by Dr. Felecia Shelling and referred to neurosurgery for input.  She did sustain a fall prior to her evaluation and required ORIF left wrist on 03/04/2018 by Dr. Caralyn Guile.   She was evaluated by [facility] and underwent T7-T8 tumor resection by Dr. Redmond Pulling.  Postop course significant for abdominal pain due to adynamic ileus requiring NG tube for decompression.  This was removed on 5 /5 and diet advanced to regular's but reported to have a lot of diarrhea last night.  She has also had issues with urinary retention probably requiring foley placement on 5/1 for bladder rest and voiding trial of 5/5 was unsuccessful therefore Foley was replaced.  Hyponatremia treated with normal saline.  Surveillance Doppler showed acute right peroneal DVT on 5 2 and repeat Dopplers recommended in 1 week for follow-up. Currently patient continues to have deficits in mobility and self-care and CIR was recommended due to thoracic myelopathy.    Review of Systems  Constitutional: Positive for malaise/fatigue (two bouts of flu this wiinter). Negative for diaphoresis.  HENT: Negative for hearing loss and tinnitus.   Eyes: Negative for blurred vision and double vision.  Respiratory: Negative for cough and shortness of breath.   Cardiovascular: Negative for chest pain and palpitations.  Gastrointestinal: Positive for diarrhea. Negative for heartburn  and nausea.       Bowel incontinence since surgery  Genitourinary: Negative for dysuria and urgency.  Musculoskeletal: Negative for back pain, joint pain and myalgias.  Skin: Negative for itching and rash.  Neurological: Positive for sensory change and focal weakness. Negative for dizziness and headaches.  Psychiatric/Behavioral: The patient is not nervous/anxious and does not have insomnia.       Past Medical History:  Diagnosis Date  . Chronic left hip pain   . GERD (gastroesophageal reflux disease)   . Gestational diabetes   . Hearing loss of both ears   . Hypertension   . Neuropathy    numbness/tingling from waist down  . Vision abnormalities     Past Surgical History:  Procedure Laterality Date  . LAPAROSCOPIC CHOLECYSTECTOMY  1992  . REFRACTIVE SURGERY Bilateral 2004    Family History  Problem Relation Age of Onset  . Dementia Mother   . COPD Mother   . Heart disease Father   . Thyroid nodules Sister   . Diabetes Mellitus II Sister   . Heart disease Brother   . Diabetes Mellitus II Brother   . Diabetes Mellitus II Brother   . Heart disease Brother     Social History:   Married. Independent and working PTA. Used cane for walking. She does not use alcohol, illicit drugs or tobacco.    Allergies: No Known Allergies    (Not in a hospital admission)  Drug Regimen Review--per records 1. Decadron 4 mg 2. Prinizide 20/12.5 mg daily 3. Robaxin 500 mg bid 4. Omeprezole 20 mg bid 5. Vitamin B6 100 mg one daily. 6. Vitamin  C 500 mg bid. 7. Stool softner 100 mb   Home: Home Living Living Arrangements: Spouse/significant other Available Help at Discharge: Family, Friend(s), Available 24 hours/day Type of Home: House Home Access: Ramped entrance Home Layout: Two level, Able to live on main level with bedroom/bathroom Alternate Level Stairs-Number of Steps: Flight to lower level apartment where nephew lives Bathroom Shower/Tub: Gaffer, Clinical research associate: Standard Bathroom Accessibility: Yes  Lives With: Spouse   Functional History: Prior Function Level of Independence: Independent with assistive device(s) Comments: Was using a cane.  Functional Status:  Mobility: Needs stand by to supervision to sit at EOB     ADL: Set up assist for grooming Max assist to roll in bed to change depends. Total assist for toileting.   Cognition:      Height 5\' 4"  (1.626 m), weight 93.4 kg (206 lb). Physical Exam  Constitutional: She appears well-developed and well-nourished.  Obese female  HENT:  Head: Normocephalic and atraumatic.  Eyes: Pupils are equal, round, and reactive to light. EOM are normal.  Neck: Normal range of motion. No thyromegaly present.  Cardiovascular: Normal rate and regular rhythm. Exam reveals no gallop and no friction rub.  No murmur heard. Respiratory: Effort normal. No respiratory distress. She has no wheezes. She has no rales.  GI: Bowel sounds are normal. She exhibits distension. There is no tenderness.  Abdominal distension with high pitched noises.   Genitourinary:  Genitourinary Comments: Foley in place. Incontinent of liquid stool. Decreased rectal tone.   Musculoskeletal: She exhibits no edema.  Left arm in splint. Fingers bruised  Neurological:  Motor RUE 5/5. LUE limited by splint can lift arm and wiggle all fingers. Decreased sensation to LT and proprioception, left greater than right brelow T8 dermatome. LE motor 2+ to 3/5 HF, KE and 3/5 ADF/PF. DTR's 1+. No resting tone.   Skin:  Back incision CDI. No signfs of skin breakdown.   Psychiatric: She has a normal mood and affect. Her behavior is normal. Judgment and thought content normal.     Recent labs: 03/20/2018 CBC: WBC- 5.9   HGB- 12.8   HCT- 36.7   Platelets_ 200 BMET: Na- 132    K+3.5   CL- 100    CO2- 22     BUN- 22    SCr- 0.46     Medical Problem List and Plan: 1.  Paraplegia and funtional deficits  secondary to T8 meningioma  with myeopathy  -recent left wrist fracture  -admit to inpatient rehab 2.  DVT Prophylaxis/right peroneal DVT: Pharmaceutical: Lovenox   -repeat dopplers next week.  3. Pain Management: Hydrocodone prn 4. Mood: LCSW to follow for evaluation and support.  5. Neuropsych: This patient is  capable of making decisions on her own behalf. 6. Skin/Wound Care: Routine pressure relief measures.  7. Fluids/Electrolytes/Nutrition: Monitor I/O. Check lytes in am.  8. Post op ileus/neurogenic bowel: Will check follow up KUB as still appears distended with high pitched sounds and liquid stools.  Pt states she's had multiple stools on acute however.  9.Neurogenic bladder: Check UA/UCS. Remove foley in am and start bladder training. 10. GERD: managed with  PPI bid.  11. Hyponatremia: Recheck labs in am.  12. Left wrist fracture: NWB.     Post Admission Physician Evaluation: 1. Functional deficits secondary  to T8 myelopathy. 2. Patient is admitted to receive collaborative, interdisciplinary care between the physiatrist, rehab nursing staff, and therapy team. 3. Patient's level of medical complexity and substantial therapy needs  in context of that medical necessity cannot be provided at a lesser intensity of care such as a SNF. 4. Patient has experienced substantial functional loss from his/her baseline which was documented above under the "Functional History" and "Functional Status" headings.  Judging by the patient's diagnosis, physical exam, and functional history, the patient has potential for functional progress which will result in measurable gains while on inpatient rehab.  These gains will be of substantial and practical use upon discharge  in facilitating mobility and self-care at the household level. 5. Physiatrist will provide 24 hour management of medical needs as well as oversight of the therapy plan/treatment and provide guidance as appropriate regarding the interaction of the two. 6. The  Preadmission Screening has been reviewed and patient status is unchanged unless otherwise stated above. 7. 24 hour rehab nursing will assist with bladder management, bowel management, safety, skin/wound care, disease management, medication administration, pain management and patient education  and help integrate therapy concepts, techniques,education, etc. 8. PT will assess and treat for/with: Lower extremity strength, range of motion, stamina, balance, functional mobility, safety, adaptive techniques and equipment, NMR, pain control.   Goals are: supervision to min assist. 9. OT will assess and treat for/with: ADL's, functional mobility, safety, upper extremity strength, adaptive techniques and equipment, ortho precautions, pain control.   Goals are: supervision to min assist. Therapy may proceed with showering this patient. 10. SLP will assess and treat for/with: n/a.  Goals are: n/a. 11. Case Management and Social Worker will assess and treat for psychological issues and discharge planning. 12. Team conference will be held weekly to assess progress toward goals and to determine barriers to discharge. 13. Patient will receive at least 3 hours of therapy per day at least 5 days per week. 14. ELOS: 18-24 days       15. Prognosis:  excellent    I have personally performed a face to face diagnostic evaluation of this patient and formulated the key components of the plan.  Additionally, I have personally reviewed laboratory data, imaging studies, as well as relevant notes and concur with the physician assistant's documentation above.  Meredith Staggers, MD, [place], PA-C 03/21/2018

## 2018-03-21 NOTE — H&P (Signed)
Physical Medicine and Rehabilitation Admission H&P    CC: Thoracic myelopathy   HPI: Hannah Jefferson is a 62 year old female with history of hypertension, gait disorder with falls weakness and decreased sensation left lower extremity greater than right lower extremity since October 2018.  She has had decline in mobility requiring cane and was continent of bowel and bladder. Work-up revealed T7-T8 mass with marked cord compression and large enhancing tail emanating from dural based mass extending around the cord.  Mass felt to be a meningioma and she was started on decadron by Dr. Felecia Shelling and referred to neurosurgery for input.  She did sustain a fall prior to her evaluation and required ORIF left wrist on 03/04/2018 by Dr. Caralyn Guile.   She was evaluated by [facility] and underwent T7-T8 tumor resection by Dr. Redmond Pulling.  Postop course significant for abdominal pain due to adynamic ileus requiring NG tube for decompression.  This was removed on 5 /5 and diet advanced to regular's but reported to have a lot of diarrhea last night.  She has also had issues with urinary retention probably requiring foley placement on 5/1 for bladder rest and voiding trial of 5/5 was unsuccessful therefore Foley was replaced.  Hyponatremia treated with normal saline.  Surveillance Doppler showed acute right peroneal DVT on 5 2 and repeat Dopplers recommended in 1 week for follow-up. Currently patient continues to have deficits in mobility and self-care and CIR was recommended due to thoracic myelopathy.    Review of Systems  Constitutional: Positive for malaise/fatigue (two bouts of flu this wiinter). Negative for diaphoresis.  HENT: Negative for hearing loss and tinnitus.   Eyes: Negative for blurred vision and double vision.  Respiratory: Negative for cough and shortness of breath.   Cardiovascular: Negative for chest pain and palpitations.  Gastrointestinal: Positive for diarrhea. Negative for  heartburn and nausea.       Bowel incontinence since surgery  Genitourinary: Negative for dysuria and urgency.  Musculoskeletal: Negative for back pain, joint pain and myalgias.  Skin: Negative for itching and rash.  Neurological: Positive for sensory change and focal weakness. Negative for dizziness and headaches.  Psychiatric/Behavioral: The patient is not nervous/anxious and does not have insomnia.           Past Medical History:  Diagnosis Date  . Chronic left hip pain   . GERD (gastroesophageal reflux disease)   . Gestational diabetes   . Hearing loss of both ears   . Hypertension   . Neuropathy    numbness/tingling from waist down  . Vision abnormalities          Past Surgical History:  Procedure Laterality Date  . LAPAROSCOPIC CHOLECYSTECTOMY  1992  . REFRACTIVE SURGERY Bilateral 2004         Family History  Problem Relation Age of Onset  . Dementia Mother   . COPD Mother   . Heart disease Father   . Thyroid nodules Sister   . Diabetes Mellitus II Sister   . Heart disease Brother   . Diabetes Mellitus II Brother   . Diabetes Mellitus II Brother   . Heart disease Brother     Social History:   Married. Independent and working PTA. Used cane for walking. She does not use alcohol, illicit drugs or tobacco.    Allergies: No Known Allergies    (Not in a hospital admission)  Drug Regimen Review--per records 1. Decadron 4 mg 2. Prinizide 20/12.5 mg daily 3. Robaxin 500 mg bid  4. Omeprezole 20 mg bid 5. Vitamin B6 100 mg one daily. 6. Vitamin C 500 mg bid. 7. Stool softner 100 mb   Home: Home Living Living Arrangements: Spouse/significant other Available Help at Discharge: Family, Friend(s), Available 24 hours/day Type of Home: House Home Access: Ramped entrance Home Layout: Two level, Able to live on main level with bedroom/bathroom Alternate Level Stairs-Number of Steps: Flight to lower level apartment where nephew  lives Bathroom Shower/Tub: Gaffer, Charity fundraiser: Standard Bathroom Accessibility: Yes  Lives With: Spouse   Functional History: Prior Function Level of Independence: Independent with assistive device(s) Comments: Was using a cane.  Functional Status:  Mobility: Needs stand by to supervision to sit at EOB     ADL: Set up assist for grooming Max assist to roll in bed to change depends. Total assist for toileting.   Cognition:    Height 5\' 4"  (1.626 m), weight 93.4 kg (206 lb). Physical Exam  Constitutional: She appears well-developed and well-nourished.  Obese female  HENT:  Head: Normocephalic and atraumatic.  Eyes: Pupils are equal, round, and reactive to light. EOM are normal.  Neck: Normal range of motion. No thyromegaly present.  Cardiovascular: Normal rate and regular rhythm. Exam reveals no gallop and no friction rub.  No murmur heard. Respiratory: Effort normal. No respiratory distress. She has no wheezes. She has no rales.  GI: Bowel sounds are normal. She exhibits distension. There is no tenderness.  Abdominal distension with high pitched noises.   Genitourinary:  Genitourinary Comments: Foley in place. Incontinent of liquid stool. Decreased rectal tone.   Musculoskeletal: She exhibits no edema.  Left arm in splint. Fingers bruised  Neurological:  Motor RUE 5/5. LUE limited by splint can lift arm and wiggle all fingers. Decreased sensation to LT and proprioception, left greater than right brelow T8 dermatome. LE motor 2+ to 3/5 HF, KE and 3/5 ADF/PF. DTR's 1+. No resting tone.   Skin:  Back incision CDI. No signfs of skin breakdown.   Psychiatric: She has a normal mood and affect. Her behavior is normal. Judgment and thought content normal.     Recent labs: 03/20/2018 CBC: WBC- 5.9   HGB- 12.8   HCT- 36.7   Platelets_ 200 BMET: Na- 132    K+3.5   CL- 100    CO2- 22     BUN- 22    SCr- 0.46     Medical Problem List and  Plan: 1.  Paraplegia and funtional deficits  secondary to T8 meningioma with myeopathy             -recent left wrist fracture             -admit to inpatient rehab 2.  DVT Prophylaxis/right peroneal DVT: Pharmaceutical: Lovenox              -repeat dopplers next week.  3. Pain Management: Hydrocodone prn 4. Mood: LCSW to follow for evaluation and support.  5. Neuropsych: This patient is  capable of making decisions on her own behalf. 6. Skin/Wound Care: Routine pressure relief measures.  7. Fluids/Electrolytes/Nutrition: Monitor I/O. Check lytes in am.  8. Post op ileus/neurogenic bowel: Will check follow up KUB as still appears distended with high pitched sounds and liquid stools.  Pt states she's had multiple stools on acute however.  9.Neurogenic bladder: Check UA/UCS. Remove foley in am and start bladder training. 10. GERD: managed with  PPI bid.  11. Hyponatremia: Recheck labs in am.  12. Left wrist fracture:  NWB.     Post Admission Physician Evaluation: 1. Functional deficits secondary  to T8 myelopathy. 2. Patient is admitted to receive collaborative, interdisciplinary care between the physiatrist, rehab nursing staff, and therapy team. 3. Patient's level of medical complexity and substantial therapy needs in context of that medical necessity cannot be provided at a lesser intensity of care such as a SNF. 4. Patient has experienced substantial functional loss from his/her baseline which was documented above under the "Functional History" and "Functional Status" headings.  Judging by the patient's diagnosis, physical exam, and functional history, the patient has potential for functional progress which will result in measurable gains while on inpatient rehab.  These gains will be of substantial and practical use upon discharge  in facilitating mobility and self-care at the household level. 5. Physiatrist will provide 24 hour management of medical needs as well as oversight of the  therapy plan/treatment and provide guidance as appropriate regarding the interaction of the two. 6. The Preadmission Screening has been reviewed and patient status is unchanged unless otherwise stated above. 7. 24 hour rehab nursing will assist with bladder management, bowel management, safety, skin/wound care, disease management, medication administration, pain management and patient education  and help integrate therapy concepts, techniques,education, etc. 8. PT will assess and treat for/with: Lower extremity strength, range of motion, stamina, balance, functional mobility, safety, adaptive techniques and equipment, NMR, pain control.   Goals are: supervision to min assist. 9. OT will assess and treat for/with: ADL's, functional mobility, safety, upper extremity strength, adaptive techniques and equipment, ortho precautions, pain control.   Goals are: supervision to min assist. Therapy may proceed with showering this patient. 10. SLP will assess and treat for/with: n/a.  Goals are: n/a. 11. Case Management and Social Worker will assess and treat for psychological issues and discharge planning. 12. Team conference will be held weekly to assess progress toward goals and to determine barriers to discharge. 13. Patient will receive at least 3 hours of therapy per day at least 5 days per week. 14. ELOS: 18-24 days       15. Prognosis:  excellent    I have personally performed a face to face diagnostic evaluation of this patient and formulated the key components of the plan.  Additionally, I have personally reviewed laboratory data, imaging studies, as well as relevant notes and concur with the physician assistant's documentation above.  Meredith Staggers, MD, [place], PA-C 03/21/2018

## 2018-03-21 NOTE — Progress Notes (Signed)
Pt admitted to unit at 1340 from [facility] with family friend at bedside. RN educated pt on safety plan and rehab process and what to anticipate with therapies with verbal understanding. RN started education on bowel and bladder programs and management. Rn discussed importance of initiating a bowel program and what to anticipate throughout stay and possible needs/care at home. RN also educated on removal of foley with plan of care in place for bladder. Discussed bladder scans and needs for I&O caths. Bladder program will depend on amount of urine and times to follow for cathing. Pt understanding needs for bowel and bladder management and engaged in conversation. Pt eagar to start rehab process and has a great attitude and outlook on IP Rehab. Call bell with in reach, family friend at bedside.

## 2018-03-22 ENCOUNTER — Inpatient Hospital Stay (HOSPITAL_COMMUNITY): Payer: 59 | Admitting: Occupational Therapy

## 2018-03-22 ENCOUNTER — Inpatient Hospital Stay (HOSPITAL_COMMUNITY): Payer: 59

## 2018-03-22 ENCOUNTER — Inpatient Hospital Stay (HOSPITAL_COMMUNITY): Payer: 59 | Admitting: Physical Therapy

## 2018-03-22 DIAGNOSIS — K567 Ileus, unspecified: Secondary | ICD-10-CM

## 2018-03-22 DIAGNOSIS — K9189 Other postprocedural complications and disorders of digestive system: Secondary | ICD-10-CM

## 2018-03-22 LAB — COMPREHENSIVE METABOLIC PANEL
ALT: 52 U/L (ref 14–54)
AST: 29 U/L (ref 15–41)
Albumin: 2.6 g/dL — ABNORMAL LOW (ref 3.5–5.0)
Alkaline Phosphatase: 45 U/L (ref 38–126)
Anion gap: 6 (ref 5–15)
BUN: 13 mg/dL (ref 6–20)
CO2: 31 mmol/L (ref 22–32)
Calcium: 8.6 mg/dL — ABNORMAL LOW (ref 8.9–10.3)
Chloride: 97 mmol/L — ABNORMAL LOW (ref 101–111)
Creatinine, Ser: 0.66 mg/dL (ref 0.44–1.00)
GFR calc Af Amer: >60 mL/min (ref >60)
GFR calc non Af Amer: >60 mL/min (ref >60)
Glucose, Bld: 105 mg/dL — ABNORMAL HIGH (ref 65–99)
Potassium: 3.1 mmol/L — ABNORMAL LOW (ref 3.5–5.1)
Sodium: 134 mmol/L — ABNORMAL LOW (ref 135–145)
Total Bilirubin: 0.9 mg/dL (ref 0.3–1.2)
Total Protein: 4.6 g/dL — ABNORMAL LOW (ref 6.5–8.1)

## 2018-03-22 LAB — CBC WITH DIFFERENTIAL/PLATELET
Basophils Absolute: 0 10*3/uL (ref 0.0–0.1)
Basophils Relative: 0 %
Eosinophils Absolute: 0 10*3/uL (ref 0.0–0.7)
Eosinophils Relative: 1 %
HCT: 37.1 % (ref 36.0–46.0)
Hemoglobin: 12.8 g/dL (ref 12.0–15.0)
Lymphocytes Relative: 21 %
Lymphs Abs: 1.3 10*3/uL (ref 0.7–4.0)
MCH: 29.3 pg (ref 26.0–34.0)
MCHC: 34.5 g/dL (ref 30.0–36.0)
MCV: 84.9 fL (ref 78.0–100.0)
Monocytes Absolute: 0.4 10*3/uL (ref 0.1–1.0)
Monocytes Relative: 7 %
Neutro Abs: 4.6 10*3/uL (ref 1.7–7.7)
Neutrophils Relative %: 71 %
Platelets: 227 10*3/uL (ref 150–400)
RBC: 4.37 MIL/uL (ref 3.87–5.11)
RDW: 13.4 % (ref 11.5–15.5)
WBC: 6.4 10*3/uL (ref 4.0–10.5)

## 2018-03-22 MED ORDER — GENERIC EXTERNAL MEDICATION
Status: DC
Start: ? — End: 2018-03-22

## 2018-03-22 MED ORDER — METOCLOPRAMIDE HCL 5 MG PO TABS
10.0000 mg | ORAL_TABLET | Freq: Three times a day (TID) | ORAL | Status: DC
  Administered 2018-03-22 – 2018-03-23 (×4): 10 mg via ORAL
  Filled 2018-03-22 (×4): qty 2

## 2018-03-22 MED ORDER — POTASSIUM CHLORIDE CRYS ER 20 MEQ PO TBCR
20.0000 meq | EXTENDED_RELEASE_TABLET | Freq: Every day | ORAL | Status: DC
  Administered 2018-03-22 – 2018-03-23 (×2): 20 meq via ORAL
  Filled 2018-03-22 (×2): qty 1

## 2018-03-22 MED ORDER — METOCLOPRAMIDE HCL 5 MG PO TABS: 5.0000 mg | ORAL_TABLET | Freq: Three times a day (TID) | ORAL | Status: DC

## 2018-03-22 NOTE — Evaluation (Signed)
Occupational Therapy Assessment and Plan  Patient Details  Name: MALALA TRENKAMP MRN: [id number] Date of Birth: 09-27-1956  OT Diagnosis: acute pain, muscle weakness (generalized) and paraparesis at level T7-8 Rehab Potential: Rehab Potential (ACUTE ONLY): Good ELOS: 26-28 days   Today's Date: 03/22/2018 OT Individual Time: 7209-4709 OT Individual Time Calculation (min): 60 min     Problem List:  Patient Active Problem List   Diagnosis Date Noted  . Thoracic myelopathy 03/21/2018  . Neurogenic bowel 03/21/2018  . Neurogenic bladder 03/21/2018  . Left wrist fracture, sequela 03/21/2018  . Numbness 02/15/2018  . Ataxic gait 02/15/2018  . Left leg weakness 02/15/2018  . Other fatigue 02/15/2018  . Urinary incontinence 02/15/2018  . Bowel incontinence 02/15/2018    Past Medical History:  Past Medical History:  Diagnosis Date  . Chronic left hip pain   . GERD (gastroesophageal reflux disease)   . Gestational diabetes   . Hearing loss of both ears   . Hypertension   . Neuropathy    numbness/tingling from waist down  . Vision abnormalities    Past Surgical History:  Past Surgical History:  Procedure Laterality Date  . LAPAROSCOPIC CHOLECYSTECTOMY  1992  . REFRACTIVE SURGERY Bilateral 2004    Assessment & Plan Clinical Impression:  Khila Papp is a 62 year old female with history of hypertension, gait disorder with falls weakness and decreased sensation left lower extremity greater than right lower extremity since October 2018.  She has had decline in mobility requiring cane and was continent of bowel and bladder. Work-up revealed T7-T8 mass with marked cord compression and large enhancing tail emanating from dural based mass extending around the cord.  Mass felt to be a meningioma and she was started on decadron by Dr. Felecia Shelling and referred to neurosurgery for input.  She did sustain a fall prior to her evaluation and required ORIF left wrist on 03/04/2018 by Dr. Caralyn Guile.   She  was evaluated by [facility] and underwent T7-T8 tumor resection by Dr. Redmond Pulling.   Postop course significant for abdominal pain due to adynamic ileus requiring NG tube for decompression.  This was removed on 5 /5 and diet advanced to regular's but reported to have a lot of diarrhea last night.  She has also had issues with urinary retention probably requiring foley placement on 5/1 for bladder rest and voiding trial of 5/5 was unsuccessful therefore Foley was replaced.  Hyponatremia treated with normal saline.  Surveillance Doppler showed acute right peroneal DVT on 5 2 and repeat Dopplers recommended in 1 week for follow-up. Currently patient continues to have deficits in mobility and self-care and CIR was recommended due to thoracic myelopathy.  Patient transferred to CIR on 03/21/2018 .    Patient currently requires total with basic self-care skills secondary to muscle weakness and muscle paralysis, decreased cardiorespiratoy endurance, unbalanced muscle activation and decreased coordination and decreased sitting balance, decreased standing balance, decreased postural control and decreased balance strategies.  Prior to hospitalization, patient could ambulate with a cane, was I with self care and was working full time.   Patient will benefit from skilled intervention to increase independence with basic self-care skills prior to discharge home with care partner.  Anticipate patient will require minimal physical assistance and follow up outpatient.  OT - End of Session Activity Tolerance: Tolerates < 10 min activity with changes in vital signs Endurance Deficit: Yes Endurance Deficit Description: low pressure in semi stand in stedy lift OT Assessment Rehab Potential (ACUTE ONLY): Good  OT Patient demonstrates impairments in the following area(s): Balance;Endurance;Motor;Pain;Sensory OT Basic ADL's Functional Problem(s): Bathing;Dressing;Toileting OT Transfers Functional Problem(s):  Toilet;Tub/Shower OT Additional Impairment(s): None OT Plan OT Intensity: Minimum of 1-2 x/day, 45 to 90 minutes OT Frequency: 5 out of 7 days OT Duration/Estimated Length of Stay: 26-28 days OT Treatment/Interventions: Balance/vestibular training;Discharge planning;DME/adaptive equipment instruction;Functional mobility training;Neuromuscular re-education;Psychosocial support;Patient/family education;Self Care/advanced ADL retraining;Therapeutic Activities;UE/LE Strength taining/ROM;Therapeutic Exercise;UE/LE Coordination activities OT Self Feeding Anticipated Outcome(s): Independent OT Basic Self-Care Anticipated Outcome(s): min A LB self care OT Toileting Anticipated Outcome(s): min A OT Bathroom Transfers Anticipated Outcome(s): min A OT Recommendation Patient destination: Home Follow Up Recommendations: Outpatient OT Equipment Recommended: Tub/shower bench;3 in 1 bedside comode   Skilled Therapeutic Intervention Pt seen for initial evaluation and ADL training with a focus on activity tolerance and trunk control.  Pt received in bed eager to participate, her sister in law who will be her main caregiver was present.  Pt stated she had only been sat on the bed and been transferred via a sliding chair at the previous hospital.  Pt worked on rolling and bed mobility to follow back precautions. LB b/d completed from bed level, pt sat to EOB with max A and then maintained balance with min A as she bathed and dressed UB with mod A. Pt initially fearful of falling.   Pt willing to try [facility] lift and felt safer when she realized she would have knee support. With her sister in law providing steadying A pt rose to stand with MAX A from therapist. She sat in stedy lift for 2-3 min to prepare for a practice transfer to [facility].  Pt suddenly felt nauseated, clammy, less verbal.  Quickly moved pt back to bed with stedy into supine with feet elevated and head down with assist from her sister.  Blood pressure  checked and it was 127/87.  Pt was feeling better. Discussed that it was her first time getting up and her body needed time to adjust, Discussed ELOS, OT goals, pt goals. OT POC.  Pt resting in bed with family in room with pt.   OT Evaluation Precautions/Restrictions  Precautions Precautions: Fall;Back Precaution Comments: LUE NWB in splint Restrictions LUE Weight Bearing: Non weight bearing     Vital Signs Therapy Vitals BP: 128/76 Pain Pain Assessment Pain Score: 4 (mild stomach pain) Home Living/Prior Functioning Home Living Available Help at Discharge: Family, Friend(s), Available 24 hours/day Type of Home: House Home Access: Ramped entrance Home Layout: Two level, Able to live on main level with bedroom/bathroom Alternate Level Stairs-Number of Steps: Flight to lower level apartment where nephew lives Bathroom Shower/Tub: Gaffer, Door, Chiropodist: Standard  Lives With: Spouse Prior Function Level of Independence: Independent with basic ADLs, Independent with homemaking with ambulation, Requires assistive device for independence, Independent with gait, Independent with transfers, Other (comment)(used cane recently)  Able to Take Stairs?: Yes Driving: Yes Vocation: Full time employment ADL ADL ADL Comments: refer to functional navigator Vision Baseline Vision/History: No visual deficits;Wears glasses Wears Glasses: Reading only Patient Visual Report: No change from baseline Vision Assessment?: No apparent visual deficits Perception  Perception: Within Functional Limits Praxis Praxis: Intact Cognition Overall Cognitive Status: Within Functional Limits for tasks assessed Arousal/Alertness: Awake/alert Orientation Level: Person;Place;Situation Person: Oriented Place: Oriented Situation: Oriented Year: 2019 Month: May Day of Week: Correct Memory: Appears intact Immediate Memory Recall: Sock;Blue;Bed Memory Recall:  Sock;Blue;Bed Memory Recall Sock: Without Cue Memory Recall Blue: Without Cue Memory Recall Bed: Without Cue Sensation  Sensation Light Touch: Impaired by gross assessment(LE - impaired light touch and localization, can feel deep pressure) Stereognosis: Appears Intact Hot/Cold: Appears Intact Proprioception: Appears Intact Coordination Gross Motor Movements are Fluid and Coordinated: No Fine Motor Movements are Fluid and Coordinated: Yes Coordination and Movement Description: UE WFL except for L wrist in long cast, very limited coordination of LEs due to impaired AROM Finger Nose Finger Test: not tested Motor  Motor Motor: Paraplegia Motor - Skilled Clinical Observations: T7-8 level Mobility    refer to functional navigator Trunk/Postural Assessment    in cervical collar, steadying A with static balance.  Mod with dynamic trunk lateral leans Balance Static Sitting Balance Static Sitting - Level of Assistance: 5: Stand by assistance Dynamic Sitting Balance Dynamic Sitting - Level of Assistance: 3: Mod assist Extremity/Trunk Assessment RUE Assessment RUE Assessment: Within Functional Limits LUE Assessment LUE Assessment: Within Functional Limits(shoulder WFL, elbow to hand in cast)   See Function Navigator for Current Functional Status.   Refer to Care Plan for Long Term Goals  Recommendations for other services: None    Discharge Criteria: Patient will be discharged from OT if patient refuses treatment 3 consecutive times without medical reason, if treatment goals not met, if there is a change in medical status, if patient makes no progress towards goals or if patient is discharged from hospital.  The above assessment, treatment plan, treatment alternatives and goals were discussed and mutually agreed upon: by patient and by family  Addison 03/22/2018, 12:59 PM

## 2018-03-22 NOTE — Evaluation (Signed)
Physical Therapy Assessment and Plan  Patient Details  Name: Hannah Jefferson MRN: [id number] Date of Birth: 06/02/1956  PT Diagnosis: Abnormality of gait, Impaired sensation, Muscle weakness and paraplegia Rehab Potential: Good ELOS: 21-24 days   Today's Date: 03/22/2018 PT Individual Time: 6269-4854 PT Individual Time Calculation (min): 60 min    Problem List:  Patient Active Problem List   Diagnosis Date Noted  . Thoracic myelopathy 03/21/2018  . Neurogenic bowel 03/21/2018  . Neurogenic bladder 03/21/2018  . Left wrist fracture, sequela 03/21/2018  . Numbness 02/15/2018  . Ataxic gait 02/15/2018  . Left leg weakness 02/15/2018  . Other fatigue 02/15/2018  . Urinary incontinence 02/15/2018  . Bowel incontinence 02/15/2018    Past Medical History:  Past Medical History:  Diagnosis Date  . Chronic left hip pain   . GERD (gastroesophageal reflux disease)   . Gestational diabetes   . Hearing loss of both ears   . Hypertension   . Neuropathy    numbness/tingling from waist down  . Vision abnormalities    Past Surgical History:  Past Surgical History:  Procedure Laterality Date  . LAPAROSCOPIC CHOLECYSTECTOMY  1992  . REFRACTIVE SURGERY Bilateral 2004    Assessment & Plan Clinical Impression: a 62 yo female who presented to [facility] for initial neurosurgical evaluation of a T7-8 meningioma referred by Dr. Sherley Bounds. Patient reported numbness in lower extremities since 10/18 worse on the left. She reported that numbness from waist down has gotten worse; legs felt heavy; difficulty walking; balance issues and weakness. She fell 03/05/18 and broke her wrist; underwent ORIF by Dr. Caralyn Guile. She underwent T7-8 laminectomies for removal of intradural extramedullary tumor on 03/13/18. She developed a post op ileus and had an NG tube. Peroneal DVT was noted on 03/16/18 and Lovenox was started. She failed voiding trial and required replacement of foley catheter. PT/OT  evaluations were completed with ongoing therapies and recommendations for inpatient rehab admission. Patient to be admitted today for acute inpatient rehab program.  Patient transferred to CIR on 03/21/2018 .   Patient currently requires max to total +2 with mobility secondary to muscle weakness and muscle paralysis, decreased cardiorespiratoy endurance, impaired timing and sequencing, abnormal tone, unbalanced muscle activation and decreased coordination and decreased sitting balance, decreased standing balance, decreased postural control, decreased balance strategies and paraplegia.  Prior to hospitalization, patient was independent  with mobility and lived with Spouse in a House home.  Home access is  Ramped entrance.  Patient will benefit from skilled PT intervention to maximize safe functional mobility, minimize fall risk and decrease caregiver burden for planned discharge home with 24 hour assist.  Anticipate patient will benefit from follow up [facility] at discharge.  PT - End of Session Activity Tolerance: Tolerates < 10 min activity, no significant change in vital signs Endurance Deficit: Yes PT Assessment Rehab Potential (ACUTE/IP ONLY): Good PT Patient demonstrates impairments in the following area(s): Balance;Pain;Endurance;Sensory;Motor;Safety PT Transfers Functional Problem(s): Bed Mobility;Bed to Chair;Car;Furniture PT Locomotion Functional Problem(s): Ambulation;Wheelchair Mobility;Stairs PT Plan PT Intensity: Minimum of 1-2 x/day ,45 to 90 minutes PT Frequency: 5 out of 7 days PT Duration Estimated Length of Stay: 21-24 days PT Treatment/Interventions: Ambulation/gait training;Discharge planning;DME/adaptive equipment instruction;Functional mobility training;Pain management;Psychosocial support;Splinting/orthotics;Therapeutic Activities;UE/LE Strength taining/ROM;Wheelchair propulsion/positioning;UE/LE Coordination activities;Therapeutic Exercise;Stair training;Patient/family  education;Neuromuscular re-education;Functional electrical stimulation;Disease management/prevention;Community reintegration;Balance/vestibular training PT Transfers Anticipated Outcome(s): min assist PT Locomotion Anticipated Outcome(s): supervision w/c level, will plan to address ambulation when safe PT Recommendation Recommendations for Other Services: Neuropsych consult;Therapeutic Recreation  consult Therapeutic Recreation Interventions: Pet therapy;Outing/community reintergration Follow Up Recommendations: Home health PT;24 hour supervision/assistance Patient destination: Home  Skilled Therapeutic Intervention No c/o pain at rest.  Session focus on initial PT evaluation and pt education in role of PT, rehab process, ELOS, goals, and plan of care.  PT instructed pt in basic transfers, as below, using stedy, and w/c positioning.  Pt highly motivated throughout session, but fatigued by end and requesting to return to bed.  +2 for sit>supine 2/2 pt fatigue, but pt able to assist with positioning in the bed.  Call bell in reach and needs met.   PT Evaluation Precautions/Restrictions Precautions Precautions: Fall;Back Precaution Comments: LUE NWB in splint Restrictions LUE Weight Bearing: Non weight bearing Home Living/Prior Functioning Home Living Available Help at Discharge: Family;Friend(s);Available 24 hours/day Type of Home: House Home Access: Ramped entrance Home Layout: Two level;Able to live on main level with bedroom/bathroom  Lives With: Spouse Prior Function Level of Independence: Independent with transfers;Independent with gait  Able to Take Stairs?: Yes Driving: Yes Vocation: Full time employment Comments: Was using a cane most immediately PTA Vision/Perception  Perception Perception: Within Functional Limits Praxis Praxis: Intact  Cognition Overall Cognitive Status: Within Functional Limits for tasks assessed Arousal/Alertness: Awake/alert Orientation Level:  Oriented X4 Memory: Appears intact Awareness: Appears intact Sensation Sensation Light Touch: Impaired Detail Light Touch Impaired Details: Impaired LLE(absent sensation to LT on LLE, intact to deep pressure above ankle) Proprioception: Appears Intact Coordination Gross Motor Movements are Fluid and Coordinated: No Fine Motor Movements are Fluid and Coordinated: Yes Heel Shin Test: not tested Motor  Motor Motor: Paraplegia;Abnormal postural alignment and control;Abnormal tone Motor - Skilled Clinical Observations: T7-8 level SCI  Mobility Bed Mobility Bed Mobility: Sit to Supine Sit to Supine: 1: +2 Total assist(pt fatigued at end of session) Sit to Supine: Patient Percentage: 20% Transfers Transfers: Yes Sit to Stand: 1: +2 Total assist;From chair/3-in-1 Sit to Stand Details (indicate cue type and reason): best attempt from elevated stedy seat with min assist Locomotion  Ambulation Ambulation: No Gait Gait: No Stairs / Additional Locomotion Stairs: No Wheelchair Mobility Wheelchair Mobility: No  Trunk/Postural Assessment  Cervical Assessment Cervical Assessment: Within Functional Limits Thoracic Assessment Thoracic Assessment: Exceptions to WFL(back precautions, mild kyphosis) Lumbar Assessment Lumbar Assessment: (preference for posterior pelvic tilt) Postural Control Postural Control: Deficits on evaluation(to be further assessed)  Balance Static Sitting Balance Static Sitting - Level of Assistance: 5: Stand by assistance Extremity Assessment      RLE Assessment RLE Assessment: (to be further assessed) LLE Assessment LLE Assessment: (to be further assessed)   See Function Navigator for Current Functional Status.   Refer to Care Plan for Long Term Goals  Recommendations for other services: Neuropsych and Therapeutic Recreation  Pet therapy, Kitchen group and Outing/community reintegration  Discharge Criteria: Patient will be discharged from PT if patient  refuses treatment 3 consecutive times without medical reason, if treatment goals not met, if there is a change in medical status, if patient makes no progress towards goals or if patient is discharged from hospital.  The above assessment, treatment plan, treatment alternatives and goals were discussed and mutually agreed upon: by patient and by family  Michel Santee 03/22/2018, 7:32 PM

## 2018-03-22 NOTE — Progress Notes (Signed)
[place] PHYSICAL MEDICINE & REHABILITATION     PROGRESS NOTE    Subjective/Complaints: Had a reasonable night. Large amounts of liquid stool last night  ROS: Patient denies fever, rash, sore throat, blurred vision, nausea, vomiting,  cough, shortness of breath or chest pain,   headache, or mood change.   Objective: Vital Signs: Blood pressure 120/76, pulse 77, temperature 98.1 F (36.7 C), temperature source Oral, resp. rate 17, height 5\' 4"  (1.626 m), weight 101.7 kg (224 lb 3.3 oz), SpO2 96 %. Dg Abd Portable 1v  Result Date: 03/21/2018 CLINICAL DATA:  Follow-up examination. Recent ileus with colonic distention. EXAM: PORTABLE ABDOMEN - 1 VIEW COMPARISON:  I have no priors for comparison. FINDINGS: Due to body habitus, the entire abdomen is not visible on the portable supine radiographs. There is colonic distention. Rectal gas is present suggesting ileus. Due to geometric magnification, measurements of cecal diameter are inaccurate. Surgical clips RIGHT upper quadrant status post cholecystectomy. IMPRESSION: Findings most consistent with colonic ileus. No priors are available to assess for interval improvement or worsening. Electronically Signed   By: Staci Righter M.D.   On: 03/21/2018 20:11   Recent Labs    03/22/18 0549  WBC 6.4  HGB 12.8  HCT 37.1  PLT 227   Recent Labs    03/22/18 0549  NA 134*  K 3.1*  CL 97*  GLUCOSE 105*  BUN 13  CREATININE 0.66  CALCIUM 8.6*   CBG (last 3)  No results for input(s): GLUCAP in the last 72 hours.  Wt Readings from Last 3 Encounters:  03/21/18 101.7 kg (224 lb 3.3 oz)  03/20/18 93.4 kg (206 lb)  02/15/18 94.1 kg (207 lb 8 oz)    Physical Exam:  Constitutional: No distress . Vital signs reviewed. HEENT: EOMI, oral membranes moist Neck: supple Cardiovascular: RRR without murmur. No JVD    Respiratory: CTA Bilaterally without wheezes or rales. Normal effort    GI: BS still rare. Less distended.  Genitourinary  Comments:Foley still in place.     Musculoskeletal: She exhibits noedema. Left arm in splint. Fingers bruised, non-tender Neurological: Motor RUE 5/5. LUE limited by splint can lift arm and wiggle all fingers. Decreased sensation to LT and proprioception, left greater than right brelow T8 dermatome. LE motor 2+ to 3/5 HF, KE and 3/5---no changes.  ADF/PF. DTR's 1+. No resting tone. Skin: Back incision CDI.   Psychiatric: She has anormal mood and affect. Herbehavior is normal.Judgmentand thought contentnormal.   Assessment/Plan: 1. Paraplegia and functional deficits secondary to T8 myelopathy, left wrist fracture which require 3+ hours per day of interdisciplinary therapy in a comprehensive inpatient rehab setting. Physiatrist is providing close team supervision and 24 hour management of active medical problems listed below. Physiatrist and rehab team continue to assess barriers to discharge/monitor patient progress toward functional and medical goals.  Function:  Bathing Bathing position      Bathing parts      Bathing assist        Upper Body Dressing/Undressing Upper body dressing                    Upper body assist        Lower Body Dressing/Undressing Lower body dressing                                  Lower body assist        Toileting  Toileting          Toileting assist     Transfers Chair/bed Clinical biochemist          Cognition Comprehension Comprehension assist level: Follows complex conversation/direction with no assist  Expression Expression assist level: Expresses complex ideas: With no assist  Social Interaction Social Interaction assist level: Interacts appropriately with others - No medications needed.  Problem Solving Problem solving assist level: Solves complex problems: Recognizes & self-corrects  Memory Memory assist level: Complete Independence: No  helper   Medical Problem List and Plan: 1.Paraplegia and funtional deficitssecondary to T8 meningioma with myeopathy -recent left wrist fracture -beginning therapies today 2. DVT Prophylaxis/right peroneal URK:[id number]:VOHYWVP -repeat dopplers next week. 3. Pain Management:Hydrocodone prn 4. Mood:LCSW to follow for evaluation and support. 5. Neuropsych: This patientiscapable of making decisions on herown behalf. 6. Skin/Wound Care:Routine pressure relief measures. 7. Fluids/Electrolytes/Nutrition:Monitor I/O.    -I personally reviewed the patient's labs today.    -replace potassium  8. Post op ileus/neurogenic bowel: KUB last night with colonic ileus. Repeat KUB appears improved. Abdomen softer today  -pt will stay with low residue foods  -encourage liquids  -will increase reglan to 10mg  qid ac/hs 9.Neurogenic bladder:foley out today, voiding trial/caths  -ua +, ucx pending---will not begin treatment unless culture + 10. GERD:managed withPPIbid. 11. Hyponatremia: 134 today.  12.Leftwrist fracture: NWB.    LOS (Days) 1 A FACE TO FACE EVALUATION WAS PERFORMED  Meredith Staggers, MD 03/22/2018 8:38 AM

## 2018-03-22 NOTE — Progress Notes (Signed)
Occupational Therapy Note  Patient Details  Name: Hannah Jefferson MRN: [id number] Date of Birth: 1956-02-23  Today's Date: 03/22/2018 OT Individual Time: 1100-1200 OT Individual Time Calculation (min): 60 min   Pt denies pain but c/o abdominal discomfort; RN aware Individual Therapy  OT intervention with focus on bed mobility and sitting balance while washing hair while seated EOB.  Shampoo cap utilized to wash hair.  Pt initiated washing hair but required assistance for completion.  Pt assisted with drying/brushing hair with dryer and hair brush.  Pt tolerated sitting EOB at supervision level for approx 40 mins with no s/s of lightheadness.  Pt required max A for bed mobility tasks. Pt remained in bed with all needs within reach and sister-in-law present.    Leotis Shames [facility] 03/22/2018, 3:27 PM

## 2018-03-22 NOTE — Progress Notes (Signed)
Hannah Diones, RN  Rehab Admission Coordinator  Physical Medicine and Rehabilitation  PMR Pre-admission  Signed  Encounter Date:  03/20/2018       Related encounter: Documentation from 03/20/2018 in [place] PMR Admission Coordinator Pre-Admission Assessment  Patient: Hannah Jefferson is an 62 y.o., female MRN: [id number] DOB: 19-Feb-1956 Height: _0  (162.6 cm) Weight: 93.4 kg (206 lb)  Insurance Information HMO:     PPO:       PCP:       IPA:       80/20:       OTHER:  [employer] Plus PRIMARY: UHC      Policy#: [id number]      Subscriber:  Hannah Jefferson CM Name: Hannah Jefferson      Phone#: [phone]     Fax#: [phone] Pre-Cert#: [id number] with update on 7th day     Employer:  Office manager Benefits:  Phone #: [phone]     Name:  On line Eff. Date: 11/15/17     Deduct:  $3500 (met $3500)      Out of Pocket Max: $5000 (met $3900.25)      Life Max: N/A CIR: $500 per admission and then 80%      SNF: 80% with 60 days max Outpatient: 20 visit limit     Co-Pay: $25/visit Home Health: 80%      Co-Pay: 20% DME: 80%     Co-Pay: 20% Providers: in network  Medicaid Application Date:       Case Manager:  Disability Application Date:       Case Worker:   Emergency Contact Information         Contact Information    Name Relation Home Work Mobile   Hannah Jefferson Spouse   [place], [place] Relative [phone]  [phone]      Current Medical History  Patient Admitting Diagnosis: 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 Sherley Bounds.  Patient reported numbness in lower extremities since 10/18 worse on the left.  She reported that numbness from waist down has gotten worse; legs felt heavy; difficulty  walking; balance issues and weakness.  She fell 03/05/18 and broke her wrist; underwent ORIF by Dr. Caralyn Guile.  She underwent T7-8 laminectomies for removal of intradural extramedullary tumor on 03/13/18.  She developed a post op ileus and had an NG tube.  Peroneal DVT was noted on 03/16/18 and Lovenox was started.  She failed voiding trial and required replacement of foley catheter.  PT/OT evaluations were completed with ongoing therapies and recommendations for inpatient rehab admission.  Patient to be admitted today for acute inpatient rehab program.  Patient's medical record from  [facility] has been reviewed by the rehabilitation admission coordinator and physician.  Past Medical History  Past Medical History:  Diagnosis Date  . Hypertension   . Vision abnormalities     Family History   family history includes COPD in her mother; Dementia in her mother; Diabetes Mellitus II in her brother, brother, and sister; Heart disease in her brother, brother, and father; Thyroid nodules in her sister.  Prior Rehab/Hospitalizations Has the patient had major surgery during 100 days prior to admission? Yes.  Patient  had a fall resulting in a left wrist fracture and underwent ORIF to left wrist recently.       Current Medications See MAR from [facility]  Patients Current Diet:  Regular diet, thin liquids  Precautions / Restrictions Precautions Precautions: Fall Precautions/Special Needs: Weight Bearing Status Precaution Comments: LUE NWB in splint Restrictions Weight Bearing Restrictions: Yes LUE Weight Bearing: Non weight bearing   Has the patient had 2 or more falls or a fall with injury in the past year?Yes Reports at least five falls with most recent fall resulting in left wrist fracture.   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  Went out daily.  Prior Functional Level Self Care: Did the patient need  help bathing, dressing, using the toilet or eating?  Independent  Indoor Mobility: Did the patient need assistance with walking from room to room (with or without device)? Independent  Stairs: Did the patient need assistance with internal or external stairs (with or without device)? Independent  Functional Cognition: Did the patient need help planning regular tasks such as shopping or remembering to take medications? [place] / [place] Devices/Equipment: Cane (specify quad or straight), Wheelchair, Other (Comment)(Shower chair.)  Prior Device Use: Indicate devices/aids used by the patient prior to current illness, exacerbation or injury? Manual wheelchair and Cane   Prior Functional Level Current Functional Level  Bed Mobility  Independent  Max assist(+2 assist)   Transfers  Independent  Max assist(+2 assist)   Mobility - Walk/Wheelchair  Independent  Max assist(+2 assist.)   Upper Body Dressing  Independent  Mod assist   Lower Body Dressing  Independent  Total assist   Grooming  Independent  Min assist   Eating/Drinking  Independent  Other(Set up)   Toilet Transfer  Independent  Total assist(+2 assist)   Bladder Continence   Incontinence at times  Urinary retention, incontinence, catheter removed 03/20/18   Bowel Management  WDL  Loose stools all night 03/19/18 into am 03/20/18   Stair Climbing   Other(Not tried.)   Communication  WDL  Verbal   Memory  WDL  WDL   Cooking/Meal Prep  Independent      Housework  Independent    Money Management  Independent    Driving       Special needs/care consideration BiPAP/CPAP No CPM No Continuous Drip IV No Dialysis No     Life Vest No Oxygen No Special Bed No Trach Size No Wound Vac (area) No     Skin Has a cast from left hand to above left elbow; incision with dressing posterior thoracic post  op area; has 2 skin tears on back with are dressed                             Bowel mgmt: Last BM loose on 03/20/18 Bladder mgmt: Urinary retention, catheter out today, may need in and out caths Diabetic mgmt No  Previous Home Environment Living Arrangements: Spouse/significant other  Lives With: Spouse Available Help at Discharge: Family, Friend(s), Available 24 hours/day Type of Home: House Home Layout: Two level, Able to live on main level with bedroom/bathroom Alternate Level Stairs-Number of Steps: Flight to lower level apartment where nephew lives Home Access: Ramped entrance [employer] Shower/Tub: Gaffer, Door Bathroom Toilet: Standard Bathroom Accessibility: Yes How Accessible: Accessible via walker  Discharge Living Setting Plans for Discharge Living Setting: House, Lives with (comment)(Lives with husband.) Type of Home at Discharge: House Discharge Home Layout: Two level, Able to live on main level with bedroom/bathroom Alternate Level Stairs-Number of Steps: Flight to apartment on lower level where nephew lives. Discharge Home Access: Ramped entrance Discharge Bathroom Shower/Tub: Walk-in shower, Door Discharge Bathroom Toilet: Standard Discharge Bathroom Accessibility: Yes How Accessible: Accessible via walker Does the patient have any problems obtaining your medications?: No  Social/Family/Support Systems Patient Roles: Spouse, Other (Comment)(Has husband and sister-in-law.) Contact Information: Ulice Brilliant - sister in law Anticipated Caregiver: Husband and sister in law Anticipated Caregiver's Contact Information: [name] - sister in law - (c) [phone] Ability/Limitations of Caregiver: Husband works days.  [name] can assist during the day as she is retired.  Has friends who can also assist while husband works. Caregiver Availability: 24/7 Discharge Plan Discussed with Primary Caregiver: Yes Is Caregiver In Agreement with Plan?: Yes Does  Caregiver/Family have Issues with Lodging/Transportation while Pt is in Rehab?: No  Goals/Additional Needs Patient/Family Goal for Rehab: PT/OT supervision to min assist goals Expected length of stay: 15-19 days Cultural Considerations: [employer] Dietary Needs: Regular diet, thin liquids Equipment Needs: TBD Pt/Family Agrees to Admission and willing to participate: Yes Program Orientation Provided & Reviewed with Pt/Caregiver Including Roles  & Responsibilities: Yes  Barriers to Discharge: Decreased caregiver support, Neurogenic Bowel & Bladder, Weight bearing restrictions  Patient Condition: I have reviewed all clinical records [facility] and I have talked with patient by way of her sister-in-law.  Patient can tolerate and benefit from 3 hours of therapy a day.  Patient needs the coordiated approach of the rehab team in order to regain loss of function and regain her independence.  I have shared all information with rehab MD and have approval for acute inpatient rehab admission for today.  Preadmission Screen Completed By:  Hannah Jefferson, 03/20/2018 12:17 PM ______________________________________________________________________   Discussed status with Dr. Naaman Plummer on 03/21/18 at 1210 and received telephone approval for admission today.  Admission Coordinator:  Hannah Jefferson, time 1210/Date 03/21/18   Assessment/Plan: Diagnosis:T7-8 tumor, wrist fracture 1. Does the need for close, 24 hr/day  Medical supervision in concert with the patient's rehab needs make it unreasonable for this patient to be served in a less intensive setting? Yes 2. Co-Morbidities requiring supervision/potential complications: pain, wound care 3. Due to bladder management, bowel management, safety, skin/wound care, disease management, medication administration, pain management and patient education, does the patient require 24 hr/day rehab nursing? Yes 4. Does the patient require coordinated care of a physician, rehab  nurse, PT (1-2 hrs/day, 5 days/week) and OT (1-2 hrs/day, 5 days/week) to address physical and functional deficits in the context of the above medical diagnosis(es)? Yes Addressing deficits in the following areas: balance, endurance, locomotion, strength, transferring, bowel/bladder control, bathing, dressing, feeding, grooming, toileting and psychosocial support 5. Can the patient actively participate in an intensive therapy program of at least 3 hrs of therapy 5 days a week? Yes 6. The potential for patient to make measurable gains while on inpatient rehab is excellent 7. Anticipated functional outcomes upon discharge from inpatients are: supervision and min assist PT, supervision and min assist OT, n/a SLP 8. Estimated rehab length of stay to reach the above functional goals is: 15-19 days 9. Does the patient have adequate social supports to accommodate these discharge functional goals? Yes 10. Anticipated D/C setting: Home 11. Anticipated post D/C treatments: HH therapy and Outpatient therapy 12. Overall  Rehab/Functional Prognosis: excellent    RECOMMENDATIONS: This patient's condition is appropriate for continued rehabilitative care in the following setting: CIR Patient has agreed to participate in recommended program. Yes Note that insurance prior authorization may be required for reimbursement for recommended care.  Comment: Admit to inpatient rehab today  Meredith Staggers, MD, [place] [facility] 03/21/2018   Hannah Jefferson 03/20/2018          Cosigned by: Meredith Staggers, MD at 03/21/2018 3:40 PM  Revision History

## 2018-03-22 NOTE — Progress Notes (Signed)
Occupational Therapy Session Note  Patient Details  Name: Hannah Jefferson MRN: [id number] Date of Birth: 04-17-1956  Today's Date: 03/22/2018 OT Individual Time: 1330-1400 OT Individual Time Calculation (min): 30 min    Short Term Goals: Week 1:  OT Short Term Goal 1 (Week 1): Pt will be able to sit to stand with max A of 1 to prepare for LB self care. OT Short Term Goal 2 (Week 1): Pt will be able to squat pivot to w/c with max A of 1.  OT Short Term Goal 3 (Week 1): Pt will don shirt with min A. OT Short Term Goal 4 (Week 1): Pt will use AE to don pants over feet with mod A.  Skilled Therapeutic Interventions/Progress Updates:    Pt seen for OT session focusing on upright tolerance and functional transfers. Pt received in supine, having just finished being cathed by nursing. Pt agreeable to tx session and denying pain. She transferred to EOB with max A, extensor tone noted when attempting to move LEs off EOB. Upon sitting EOB, BP 124/80, no complaints of dizziness. Required CGA- close supervision for static sitting balance EOB. Stood from elevated EOB into STEDY with +2 assist, perched in STEDY, BP 129/84, no dizziness.  Used STEDY to transfer to [facility] for simulated toilet transfer. Pt able to sit on [facility] with supervision. Upon straining to come into standing from [facility], pt voiced feeling urge for BM. Therefore, removed clothing total A and returned to sitting on BSC. Pt desiring to stay sitting on BSC until next therapist in 15 minutes. Pt's sister-in-law present to provide supervision and use of call bell, RN made aware of pt's position. Education provdied throughout session regarding return of function, WBing pre-cautions, POC, and d/c planning,   Therapy Documentation Precautions:  Restrictions Weight Bearing Restrictions: Yes LUE Weight Bearing: Non weight bearing Pain:   No/denies pain  See Function Navigator for Current Functional Status.   Therapy/Group: Individual  Therapy  Arbadella Kimbler L 03/22/2018, 7:23 AM

## 2018-03-23 ENCOUNTER — Inpatient Hospital Stay (HOSPITAL_COMMUNITY): Payer: 59

## 2018-03-23 ENCOUNTER — Encounter (HOSPITAL_COMMUNITY): Payer: 59

## 2018-03-23 ENCOUNTER — Inpatient Hospital Stay (HOSPITAL_COMMUNITY): Payer: 59 | Admitting: Physical Therapy

## 2018-03-23 LAB — BASIC METABOLIC PANEL
Anion gap: 8 (ref 5–15)
BUN: 12 mg/dL (ref 6–20)
CO2: 29 mmol/L (ref 22–32)
Calcium: 8.6 mg/dL — ABNORMAL LOW (ref 8.9–10.3)
Chloride: 97 mmol/L — ABNORMAL LOW (ref 101–111)
Creatinine, Ser: 0.55 mg/dL (ref 0.44–1.00)
GFR calc Af Amer: >60 mL/min (ref >60)
GFR calc non Af Amer: >60 mL/min (ref >60)
Glucose, Bld: 99 mg/dL (ref 65–99)
Potassium: 2.5 mmol/L — CL (ref 3.5–5.1)
Sodium: 134 mmol/L — ABNORMAL LOW (ref 135–145)

## 2018-03-23 LAB — URINE CULTURE: Culture: >=100000 — AB

## 2018-03-23 MED ORDER — SODIUM CHLORIDE 0.45 % IV SOLN: INTRAVENOUS | Status: DC

## 2018-03-23 MED ORDER — GENERIC EXTERNAL MEDICATION
Status: DC
Start: ? — End: 2018-03-23

## 2018-03-23 MED ORDER — CEPHALEXIN 250 MG/5ML PO SUSR
500.0000 mg | Freq: Three times a day (TID) | ORAL | Status: DC
  Administered 2018-03-23 – 2018-03-24 (×3): 500 mg via ORAL
  Filled 2018-03-23 (×4): qty 10

## 2018-03-23 MED ORDER — POTASSIUM CHLORIDE 2 MEQ/ML IV SOLN
INTRAVENOUS | Status: DC
  Administered 2018-03-23 (×2): via INTRAVENOUS
  Filled 2018-03-23 (×3): qty 1000

## 2018-03-23 MED ORDER — METOCLOPRAMIDE HCL 5 MG/ML IJ SOLN
10.0000 mg | Freq: Four times a day (QID) | INTRAMUSCULAR | Status: DC
  Administered 2018-03-23 – 2018-04-10 (×70): 10 mg via INTRAVENOUS
  Filled 2018-03-23 (×70): qty 2

## 2018-03-23 MED ORDER — POTASSIUM CHLORIDE CRYS ER 20 MEQ PO TBCR
40.0000 meq | EXTENDED_RELEASE_TABLET | Freq: Three times a day (TID) | ORAL | Status: DC
  Administered 2018-03-23 (×3): 40 meq via ORAL
  Filled 2018-03-23 (×3): qty 2

## 2018-03-23 NOTE — Progress Notes (Signed)
Responded to [facility] to assist patient with AD.  Patient was been seem by staff and not available handling personal matters. AD was left for patient review. Will page chaplain when paitent is ready.  Chaplain available as needed.  Jaclynn Major, [place], [facility], Pager [phone]

## 2018-03-23 NOTE — Progress Notes (Signed)
[place] PHYSICAL MEDICINE & REHABILITATION     PROGRESS NOTE    Subjective/Complaints: Had a reasonable night. Large amounts of liquid stool last night  ROS: Patient denies fever, rash, sore throat, blurred vision, nausea, vomiting,  cough, shortness of breath or chest pain,   headache, or mood change.   Objective: Vital Signs: Blood pressure 124/76, pulse 64, temperature 98.1 F (36.7 C), resp. rate 16, height 5\' 4"  (1.626 m), weight 101.7 kg (224 lb 3.3 oz), SpO2 98 %. Dg Abd 1 View  Result Date: 03/22/2018 CLINICAL DATA:  Follow-up generalized ileus.  LOWER abdominal pain. EXAM: ABDOMEN - 1 VIEW COMPARISON:  03/21/2018. FINDINGS: Persistent gaseous distension of the colon and several loops of small bowel throughout the abdomen. The cecum and proximal ascending colon are the most distended segment, with the cecum measuring approximately 13 cm diameter, unchanged. No evidence of free intraperitoneal air on the supine image. Surgical clips in the RIGHT UPPER quadrant from prior cholecystectomy. Phleboliths in the LEFT side of the low pelvis. Degenerative changes involving the symphysis pubis. Thoracolumbar scoliosis with degenerative changes throughout the lumbar spine. IMPRESSION: Stable severe ileus, with marked gaseous distension of the cecum up to approximately 13 cm (unchanged since yesterday). Electronically Signed   By: Evangeline Dakin M.D.   On: 03/22/2018 10:06   Dg Abd Portable 1v  Result Date: 03/21/2018 CLINICAL DATA:  Follow-up examination. Recent ileus with colonic distention. EXAM: PORTABLE ABDOMEN - 1 VIEW COMPARISON:  I have no priors for comparison. FINDINGS: Due to body habitus, the entire abdomen is not visible on the portable supine radiographs. There is colonic distention. Rectal gas is present suggesting ileus. Due to geometric magnification, measurements of cecal diameter are inaccurate. Surgical clips RIGHT upper quadrant status post cholecystectomy. IMPRESSION:  Findings most consistent with colonic ileus. No priors are available to assess for interval improvement or worsening. Electronically Signed   By: Staci Righter M.D.   On: 03/21/2018 20:11   Recent Labs    03/22/18 0549  WBC 6.4  HGB 12.8  HCT 37.1  PLT 227   Recent Labs    03/22/18 0549 03/23/18 0529  NA 134* 134*  K 3.1* 2.5*  CL 97* 97*  GLUCOSE 105* 99  BUN 13 12  CREATININE 0.66 0.55  CALCIUM 8.6* 8.6*   CBG (last 3)  No results for input(s): GLUCAP in the last 72 hours.  Wt Readings from Last 3 Encounters:  03/21/18 101.7 kg (224 lb 3.3 oz)  03/20/18 93.4 kg (206 lb)  02/15/18 94.1 kg (207 lb 8 oz)    Physical Exam:  Constitutional: No distress . Vital signs reviewed. HEENT: EOMI, oral membranes moist Neck: supple Cardiovascular: RRR without murmur. No JVD    Respiratory: CTA Bilaterally without wheezes or rales. Normal effort    GI: BS still rare. Less distended.  Genitourinary Comments:Foley still in place.     Musculoskeletal: She exhibits noedema. Left arm in splint. Fingers bruised, non-tender Neurological: Motor RUE 5/5. LUE limited by splint can lift arm and wiggle all fingers. Decreased sensation to LT and proprioception, left greater than right brelow T8 dermatome. LE motor 2+ to 3/5 HF, KE and 3/5---no changes.  ADF/PF. DTR's 1+. No resting tone. Skin: Back incision CDI.   Psychiatric: She has anormal mood and affect. Herbehavior is normal.Judgmentand thought contentnormal.   Assessment/Plan: 1. Paraplegia and functional deficits secondary to T8 myelopathy, left wrist fracture which require 3+ hours per day of interdisciplinary therapy in a comprehensive inpatient  rehab setting. Physiatrist is providing close team supervision and 24 hour management of active medical problems listed below. Physiatrist and rehab team continue to assess barriers to discharge/monitor patient progress toward functional and medical  goals.  Function:  Bathing Bathing position   Position: Bed(LB, UB EOB)  Bathing parts Body parts bathed by patient: Chest, Abdomen, Left arm Body parts bathed by helper: Right arm, Front perineal area, Buttocks, Right upper leg, Left upper leg, Right lower leg, Left lower leg, Back  Bathing assist        Upper Body Dressing/Undressing Upper body dressing   What is the patient wearing?: Bra, Pull over shirt/dress Bra - Perfomed by patient: Thread/unthread left bra strap, Thread/unthread right bra strap Bra - Perfomed by helper: Hook/unhook bra (pull down sports bra) Pull over shirt/dress - Perfomed by patient: Thread/unthread right sleeve, Thread/unthread left sleeve Pull over shirt/dress - Perfomed by helper: Put head through opening, Pull shirt over trunk        Upper body assist        Lower Body Dressing/Undressing Lower body dressing   What is the patient wearing?: Pants, Non-skid slipper socks       Pants- Performed by helper: Thread/unthread right pants leg, Thread/unthread left pants leg, Pull pants up/down   Non-skid slipper socks- Performed by helper: Don/doff right sock, Don/doff left sock                  Lower body assist        Toileting Toileting     Toileting steps completed by helper: Adjust clothing prior to toileting, Performs perineal hygiene, Adjust clothing after toileting    Toileting assist Assist level: Two helpers   Transfers Chair/bed transfer   Chair/bed transfer method: Other Chair/bed transfer assist level: dependent (Pt equals 0%) Chair/bed transfer assistive device: Mechanical lift Mechanical lift: Stedy   Locomotion Ambulation Ambulation activity did not occur: Safety/medical concerns         Wheelchair   Type: Manual Max wheelchair distance: 150 Assist Level: Dependent (Pt equals 0%)  Cognition Comprehension Comprehension assist level: Follows complex conversation/direction with no assist  Expression Expression  assist level: Expresses complex ideas: With no assist  Social Interaction Social Interaction assist level: Interacts appropriately with others - No medications needed.  Problem Solving Problem solving assist level: Solves complex 90% of the time/cues < 10% of the time  Memory Memory assist level: Complete Independence: No helper   Medical Problem List and Plan: 1.Paraplegia and funtional deficitssecondary to T8 meningioma with myeopathy -recent left wrist fracture -continue PT, OT 2. DVT Prophylaxis/right peroneal ZTI:[id number] -repeat dopplers next week. 3. Pain Management:Hydrocodone prn 4. Mood:LCSW to follow for evaluation and support. 5. Neuropsych: This patientiscapable of making decisions on herown behalf. 6. Skin/Wound Care:Routine pressure relief measures. 7. Fluids/Electrolytes/Nutrition:Monitor I/O.    -continued hypokalemia---aggressive replacement today 8. Post op ileus/neurogenic bowel:  -belly softer, pt states she's feeling better but KUB without much change today   -change to liquid diet  -begin IV reglan  -SSE 9.Neurogenic bladder:foley out today, voiding trial/caths  -ua +, ucx pending---will not begin treatment unless culture + 10. GERD:managed withPPIbid. 11. Hyponatremia: 134 again today.  12.Leftwrist fracture: NWB.    LOS (Days) 2 A FACE TO FACE EVALUATION WAS PERFORMED  Meredith Staggers, MD 03/23/2018 8:14 AM

## 2018-03-23 NOTE — Progress Notes (Signed)
Physical Therapy Session Note  Patient Details  Name: Hannah Jefferson MRN: [id number] Date of Birth: 1956/10/16  Today's Date: 03/23/2018 PT Individual Time: 1100-1200 PT Individual Time Calculation (min): 60 min   Short Term Goals: Week 1:  PT Short Term Goal 1 (Week 1): Pt will verbalize 2 methods of pressure relief in w/c with min cues PT Short Term Goal 2 (Week 1): Pt will tolerate supported standing x1 minute for decreased burden of care with ADLs PT Short Term Goal 3 (Week 1): Pt will initiate w/c propulsion for UE strengthening and cardiovascular endurance PT Short Term Goal 4 (Week 1): Pt will utilize LEs and RUE to assist with bed mobility in 100% of opportunities with min cues  Skilled Therapeutic Interventions/Progress Updates:    no c/o pain.  Session focus on NMR via functional mobility retraining.    Pt completes bridging for lower body dressing at bed level.  Supine>sit via log roll with min assist and tactile cues for maintaining back precautions to roll, and max assist to come to sitting EOB with HOB flat.  Sit<>stand in stedy from elevated bed with mod assist, verbal cues for forward gaze and pushing through LEs.  Pt noted L ankle rolling in stedy (asked for aircast order).  Attempted sit<>stand in // bars but pt unable to power up despite +2 assist.  Transition to focus on forward weight shift and activation of glutes for sit<>squat.  Pt able to complete 5 reps with progressively improving clearance of bottom.  Returned to room at end of session and positioned back to bed for nursing care.  Call bell in reach and needs met.     Therapy Documentation Precautions:  Precautions Precautions: Fall, Back Precaution Comments: LUE NWB in splint Restrictions Weight Bearing Restrictions: Yes LUE Weight Bearing: Non weight bearing   See Function Navigator for Current Functional Status.   Therapy/Group: Individual Therapy  Michel Santee 03/23/2018, 12:31 PM

## 2018-03-23 NOTE — Progress Notes (Signed)
Physical Therapy Note  Patient Details  Name: Hannah Jefferson MRN: [id number] Date of Birth: [birth year] Today's Date: 03/23/2018  [id number], 45 min individual tx Pain: none per pt Deidre Ala, RN with pt putting in EV for low K+. She asked that pt be seen bedside for now.  neuromuscular re-education via multimodal cues and demo for:  Supine: gentle foot/ankle PROM, including instruction for sister in law in this bil glut sets, bil hip internal rotation,  cervical flexion; active assistive hip circles, heel slides, and R/LLE resisted mass extension  Hook lying: bil bridging, bil adductor squeezes against resistance of towel roll, and R isolated hip adduction/abduction  Pt demonstrated activation in all planes of hip, knee and ankle, bil; L>R.   Instructed sister in law in donning PRAFOs  Pt left resting in bed with all needs at hand, and alarm set.  See function navigator for current status.  Aydrian Halpin 03/23/2018, 8:53 AM

## 2018-03-23 NOTE — Progress Notes (Signed)
Occupational Therapy Session Note  Patient Details  Name: Hannah Jefferson MRN: [id number] Date of Birth: [birth year]  Today's Date: 03/23/2018 OT Individual Time: 1401-1502 OT Individual Time Calculation (min): 61 min    Short Term Goals: Week 1:  OT Short Term Goal 1 (Week 1): Pt will be able to sit to stand with max A of 1 to prepare for LB self care. OT Short Term Goal 2 (Week 1): Pt will be able to squat pivot to w/c with max A of 1.  OT Short Term Goal 3 (Week 1): Pt will don shirt with min A. OT Short Term Goal 4 (Week 1): Pt will use AE to don pants over feet with mod A.  Skilled Therapeutic Interventions/Progress Updates:    Pt supine in bed agreeable to therapy. Total A provided to don air splint on LLE and shoes. Mod Instructional cues provided re log rolling technique to transition supine to EOB, with max A required and HOB elevated. Vc provided for adherence to L UE NWB status. Pt sat EOB with CGA and vc provided to self- correct balance. Pt completed sit to stand transfer in stedy with mod A +2 for safety. Mod A required to safely/slowly lower to [facility] from stedy, and max A for standing level brief management. Pt required increased time to attempt and have BM. While sitting on BSC pt completed overhead hair care, shampooing, brushing, and blow drying hair with set up. Pt completed sit to stand from [facility] with max A+2 A. Max A required for peri-hygiene and donning brief in standing. Pt unable to stand for more than ~20 sec. Pt returned to bed with bed alarm set and all needs met.   Therapy Documentation Precautions:  Precautions Precautions: Fall, Back Precaution Comments: LUE NWB in splint Restrictions Weight Bearing Restrictions: Yes LUE Weight Bearing: Non weight bearing   Vital Signs: Therapy Vitals Temp: 98.1 F (36.7 C) Pulse Rate: 85 Resp: 18 BP: 127/85 Patient Position (if appropriate): Lying Oxygen Therapy SpO2: 98 % O2 Device: Room Air Pain: Pain  Assessment Pain Scale: 0-10 Pain Score: 0-No pain ADL: ADL ADL Comments: refer to functional navigator  See Function Navigator for Current Functional Status.   Therapy/Group: Individual Therapy  Curtis Sites 03/23/2018, 3:41 PM

## 2018-03-23 NOTE — Plan of Care (Signed)
Soap suds enema not effective. PA notified, ordered to give time before next intervention, continue IV reglan.

## 2018-03-23 NOTE — Progress Notes (Signed)
Orthopedic Tech Progress Note Patient Details:  Hannah Jefferson 07-17-1956 [id number]  Ortho Devices Type of Ortho Device: Ankle Air splint Ortho Device/Splint Location: bilateral Ortho Device/Splint Interventions: Application   Post Interventions Patient Tolerated: Well Instructions Provided: Care of device   [place] Pink 03/23/2018, 1:03 PM

## 2018-03-23 NOTE — Progress Notes (Signed)
Physical Therapy Session Note  Patient Details  Name: Hannah Jefferson MRN: [id number] Date of Birth: 09-19-1956  Today's Date: 03/23/2018 PT Individual Time: 7741-2878 PT Individual Time Calculation (min): 30 min   Short Term Goals: Week 1:  PT Short Term Goal 1 (Week 1): Pt will verbalize 2 methods of pressure relief in w/c with min cues PT Short Term Goal 2 (Week 1): Pt will tolerate supported standing x1 minute for decreased burden of care with ADLs PT Short Term Goal 3 (Week 1): Pt will initiate w/c propulsion for UE strengthening and cardiovascular endurance PT Short Term Goal 4 (Week 1): Pt will utilize LEs and RUE to assist with bed mobility in 100% of opportunities with min cues  Skilled Therapeutic Interventions/Progress Updates:    Pt presents on bedpan after receiving enema. Functional rolling for removal of bedpan, hygiene, and donning of new brief with min assist for placement of UE and pt using rails for support. Pt requires assist to sustain knee in flexed position to push through during rolling (unable to bridge enough for clearance of bottom). Due to fatigue, pt request to stay in bed at this time. Supine NMR to address motor control and coordination for heel slides, hip abduction/adduction, and adduction squeezes in bridged position (with assist to maintain bridged knee position) with cues for slowed movement and to count during exercises due to pt tendency for holding breath x 10 reps each.  Therapy Documentation Precautions:  Precautions Precautions: Fall, Back Precaution Comments: LUE NWB in splint Restrictions Weight Bearing Restrictions: Yes LUE Weight Bearing: Non weight bearing    Pain: No current complaints except stomach feeling "rolling".  See Function Navigator for Current Functional Status.   Therapy/Group: Individual Therapy  Canary Brim Ivory Broad, PT, DPT  03/23/2018, 2:05 PM

## 2018-03-23 NOTE — Care Management (Signed)
[place] Individual Statement of Services  Patient Name:  Hannah Jefferson  Date:  03/23/2018  Welcome to the [facility].  Our goal is to provide you with an individualized program based on your diagnosis and situation, designed to meet your specific needs.  With this comprehensive rehabilitation program, you will be expected to participate in at least 3 hours of rehabilitation therapies Monday-Friday, with modified therapy programming on the weekends.  Your rehabilitation program will include the following services:  Physical Therapy (PT), Occupational Therapy (OT), 24 hour per day rehabilitation nursing, Therapeutic Recreaction (TR), Neuropsychology, Case Management (Social Worker), Rehabilitation Medicine, Nutrition Services and Pharmacy Services  Weekly team conferences will be held on Tuesdays to discuss your progress.  Your Social Worker will talk with you frequently to get your input and to update you on team discussions.  Team conferences with you and your family in attendance may also be held.  Expected length of stay: 21-24 days    Overall anticipated outcome: minimal assistance  Depending on your progress and recovery, your program may change. Your Social Worker will coordinate services and will keep you informed of any changes. Your Social Worker's name and contact numbers are listed  below.  The following services may also be recommended but are not provided by the [facility] will be made to provide these services after discharge if needed.  Arrangements include referral to agencies that provide these services.  Your insurance has been verified to be:  [employer] Your primary doctor is:  Engineering geologist  Pertinent information will be shared with your doctor and your insurance  company.  Social Worker:  [place], [place] or ([phone]   Information discussed with and copy given to patient by: Lennart Pall, 03/23/2018, 3:33 PM

## 2018-03-24 ENCOUNTER — Inpatient Hospital Stay (HOSPITAL_COMMUNITY): Payer: 59 | Admitting: Occupational Therapy

## 2018-03-24 ENCOUNTER — Inpatient Hospital Stay (HOSPITAL_COMMUNITY): Payer: 59

## 2018-03-24 ENCOUNTER — Ambulatory Visit (HOSPITAL_COMMUNITY): Payer: 59

## 2018-03-24 ENCOUNTER — Inpatient Hospital Stay (HOSPITAL_COMMUNITY): Payer: 59 | Admitting: Physical Therapy

## 2018-03-24 LAB — CBC
HCT: 44.5 % (ref 36.0–46.0)
Hemoglobin: 15.3 g/dL — ABNORMAL HIGH (ref 12.0–15.0)
MCH: 29.8 pg (ref 26.0–34.0)
MCHC: 34.4 g/dL (ref 30.0–36.0)
MCV: 86.6 fL (ref 78.0–100.0)
Platelets: 355 10*3/uL (ref 150–400)
RBC: 5.14 MIL/uL — ABNORMAL HIGH (ref 3.87–5.11)
RDW: 14.2 % (ref 11.5–15.5)
WBC: 15.5 10*3/uL — ABNORMAL HIGH (ref 4.0–10.5)

## 2018-03-24 LAB — BASIC METABOLIC PANEL
Anion gap: 10 (ref 5–15)
Anion gap: 11 (ref 5–15)
BUN: 12 mg/dL (ref 6–20)
BUN: 14 mg/dL (ref 6–20)
CO2: 21 mmol/L — ABNORMAL LOW (ref 22–32)
CO2: 22 mmol/L (ref 22–32)
Calcium: 9 mg/dL (ref 8.9–10.3)
Calcium: 9.1 mg/dL (ref 8.9–10.3)
Chloride: 98 mmol/L — ABNORMAL LOW (ref 101–111)
Chloride: 98 mmol/L — ABNORMAL LOW (ref 101–111)
Creatinine, Ser: 0.62 mg/dL (ref 0.44–1.00)
Creatinine, Ser: 0.66 mg/dL (ref 0.44–1.00)
GFR calc Af Amer: >60 mL/min (ref >60)
GFR calc Af Amer: >60 mL/min (ref >60)
GFR calc non Af Amer: >60 mL/min (ref >60)
GFR calc non Af Amer: >60 mL/min (ref >60)
Glucose, Bld: 111 mg/dL — ABNORMAL HIGH (ref 65–99)
Glucose, Bld: 233 mg/dL — ABNORMAL HIGH (ref 65–99)
Potassium: 4 mmol/L (ref 3.5–5.1)
Potassium: 3.6 mmol/L (ref 3.5–5.1)
Sodium: 129 mmol/L — ABNORMAL LOW (ref 135–145)
Sodium: 131 mmol/L — ABNORMAL LOW (ref 135–145)

## 2018-03-24 LAB — MAGNESIUM: Magnesium: 2 mg/dL (ref 1.7–2.4)

## 2018-03-24 MED ORDER — GENERIC EXTERNAL MEDICATION
Status: DC
Start: ? — End: 2018-03-24

## 2018-03-24 MED ORDER — SODIUM CHLORIDE 0.9 % IV SOLN
1.0000 g | INTRAVENOUS | Status: AC
  Administered 2018-03-24 – 2018-03-30 (×7): 1 g via INTRAVENOUS
  Filled 2018-03-24 (×8): qty 10

## 2018-03-24 MED ORDER — LIDOCAINE VISCOUS 2 % MT SOLN
15.0000 mL | Freq: Once | OROMUCOSAL | Status: AC
  Administered 2018-03-24: 3 mL via OROMUCOSAL
  Filled 2018-03-24: qty 15

## 2018-03-24 MED ORDER — DEXAMETHASONE SODIUM PHOSPHATE 10 MG/ML IJ SOLN
1.0000 mg | Freq: Two times a day (BID) | INTRAMUSCULAR | Status: AC
  Administered 2018-03-24: 1 mg via INTRAVENOUS
  Filled 2018-03-24 (×2): qty 0.1

## 2018-03-24 MED ORDER — LIDOCAINE VISCOUS 2 % MT SOLN
OROMUCOSAL | Status: AC
  Filled 2018-03-24: qty 15

## 2018-03-24 MED ORDER — KCL IN DEXTROSE-NACL 40-5-0.9 MEQ/L-%-% IV SOLN
INTRAVENOUS | Status: DC
  Administered 2018-03-24 – 2018-04-04 (×16): via INTRAVENOUS
  Administered 2018-04-05: 75 mL/h via INTRAVENOUS
  Administered 2018-04-06 – 2018-04-09 (×5): via INTRAVENOUS
  Filled 2018-03-24 (×31): qty 1000

## 2018-03-24 MED ORDER — SORBITOL 70 % SOLN
960.0000 mL | TOPICAL_OIL | Freq: Once | ORAL | Status: AC
  Administered 2018-03-24: 960 mL via RECTAL
  Filled 2018-03-24: qty 473

## 2018-03-24 MED ORDER — FAMOTIDINE IN NACL 20-0.9 MG/50ML-% IV SOLN
20.0000 mg | Freq: Two times a day (BID) | INTRAVENOUS | Status: DC
  Administered 2018-03-24 – 2018-03-29 (×11): 20 mg via INTRAVENOUS
  Filled 2018-03-24 (×11): qty 50

## 2018-03-24 NOTE — Progress Notes (Signed)
Occupational Therapy Session Note  Patient Details  Name: Hannah Jefferson MRN: [id number] Date of Birth: 19-Jan-1956  Today's Date: 03/24/2018 OT Individual Time: 9470-9628 OT Individual Time Calculation (min): 75 min    Short Term Goals: Week 1:  OT Short Term Goal 1 (Week 1): Pt will be able to sit to stand with max A of 1 to prepare for LB self care. OT Short Term Goal 2 (Week 1): Pt will be able to squat pivot to w/c with max A of 1.  OT Short Term Goal 3 (Week 1): Pt will don shirt with min A. OT Short Term Goal 4 (Week 1): Pt will use AE to don pants over feet with mod A.  Skilled Therapeutic Interventions/Progress Updates:    Pt resting in bed upon arrival.  Pt stated she had not been able to rest/sleep all night secondary to abdominal pain.  Pt agreeable to get OOB onto [facility] with Stedy.  Pt c/o n/v throughout time OOB.  Pt with watery stool while seated on BSC and incontinent of bowel when straining to stand from [facility].  Pt completed UB bathing/dressing while seated on BSC. Pt brushed teeth while seated BSC.  Pt returned to bed with Stedy and laid on L side to facilitate nursing care at end of session.  Pt c/o ongoing abdominal pain throughout session.  Focus on activity tolerance, sitting balance, and safety awareness to increase independence with BADLs.   Therapy Documentation Precautions:  Precautions Precautions: Fall, Back Precaution Comments: LUE NWB in splint Restrictions Weight Bearing Restrictions: Yes LUE Weight Bearing: Non weight bearing   Pain:  Pt c/o abdominal pain (10/10; RN and MD aware  See Function Navigator for Current Functional Status.   Therapy/Group: Individual Therapy  [name] 03/24/2018, 12:51 PM

## 2018-03-24 NOTE — Consult Note (Signed)
[facility] Consult Note  Hannah Jefferson 11/15/1956  [id number].    Requesting MD: Alger Simons Chief Complaint/Reason for Consult: ileus  HPI:  Hannah Jefferson is a 62yo female found to have a T7-T8 mass with marked cord compression felt to be a meningioma s/p T7-T8 tumor resection by Dr. Redmond Pulling at [facility]. Postop course complicated by adynamic ileus requiring NG tube. This was removed on 5/5 and diet advanced as tolerated. Patient was admitted to CIR at [facility] on 5/7. Initially doing well and last night she developed increased abdominal pain and bloating. States that that pain is crampy and intermittent.  Worse with any PO intake or thinking about food. She reports some mild nausea, and emesis x1 when trying to replace NG tube. She had a very loose BM earlier today and is passing minimal gas. She received an enema with no benefit. Abdominal xray today showed gaseous distension small bowel and colon with the cecum measuring up to 13 cm. General surgery asked to see.  Abdominal surgical history: laparoscopic cholecystectomy  ROS: Review of Systems  Constitutional: Negative.   HENT: Negative.   Eyes: Negative.   Respiratory: Negative.   Cardiovascular: Negative.   Gastrointestinal: Positive for abdominal pain, constipation, diarrhea, nausea and vomiting.  Genitourinary: Negative.   Musculoskeletal: Negative.   Skin: Negative.   Neurological: Positive for sensory change and weakness.   All systems reviewed and otherwise negative except for as above  Family History  Problem Relation Age of Onset  . Dementia Mother   . COPD Mother   . Heart disease Father   . Thyroid nodules Sister   . Diabetes Mellitus II Sister   . Heart disease Brother   . Diabetes Mellitus II Brother   . Diabetes Mellitus II Brother   . Heart disease Brother     Past Medical History:  Diagnosis Date  . Chronic left hip pain   . GERD (gastroesophageal reflux disease)   . Gestational  diabetes   . Hearing loss of both ears   . Hypertension   . Neuropathy    numbness/tingling from waist down  . Vision abnormalities     Past Surgical History:  Procedure Laterality Date  . LAPAROSCOPIC CHOLECYSTECTOMY  1992  . REFRACTIVE SURGERY Bilateral 2004    Social History:  reports that she has never smoked. She has never used smokeless tobacco. She reports that she does not drink alcohol or use drugs.  Allergies: No Known Allergies  Medications Prior to Admission  Medication Sig Dispense Refill  . acetaminophen (TYLENOL) 500 MG tablet Take 1,000 mg by mouth daily as needed for mild pain.    . bisacodyl (DULCOLAX) 10 MG suppository Place 10 mg rectally as needed for moderate constipation.    . calcium carbonate (TUMS - DOSED IN MG ELEMENTAL CALCIUM) 500 MG chewable tablet Chew 1 tablet by mouth 3 (three) times daily as needed for indigestion or heartburn.    . dexamethasone (DECADRON) 4 MG tablet Take 3 po qd (Patient taking differently: Take 1-3 mg by mouth See admin instructions. Take three tablets three times daily with meals for three days through Tuesday 03-21-18. Then take two tablets three times daily with meals on 03-22-18. Then take one tablet three times daily with meals on 03-23-18. Then take one tablet twice daily with meals on 03-24-18. Then STOP.) 100 tablet 1  . gabapentin (NEURONTIN) 300 MG capsule Take 300 mg by mouth 3 (three) times daily.    Marland Kitchen HYDROcodone-acetaminophen (NORCO/VICODIN)  5-325 MG tablet Take 1-2 tablets by mouth every 4 (four) hours as needed for moderate pain (one tablet for moderate pain and two tablets for severe pain).    . lisinopril-hydrochlorothiazide (PRINZIDE,ZESTORETIC) 20-12.5 MG tablet Take one tablet by mouth once daily.  0  . methocarbamol (ROBAXIN) 500 MG tablet Take 500 mg by mouth 2 (two) times daily. For ten days    . omeprazole (PRILOSEC) 20 MG capsule Take 20 mg by mouth 2 (two) times daily before a meal.    . polyethylene glycol  (MIRALAX / GLYCOLAX) packet Take 17 g by mouth daily as needed for mild constipation.    . pyridoxine (B-6) 200 MG tablet Take 200 mg by mouth daily.    . senna (SENOKOT) 8.6 MG TABS tablet Take 2 tablets by mouth See admin instructions. Twice daily while taking Norco to avoid constipation.    . tamsulosin (FLOMAX) 0.4 MG CAPS capsule Take 0.4 mg by mouth daily after supper.    . vitamin C (ASCORBIC ACID) 500 MG tablet Take 500 mg by mouth 2 (two) times daily.  0  . oxybutynin (DITROPAN) 5 MG tablet Take 1 tablet (5 mg total) by mouth 2 (two) times daily. (Patient not taking: Reported on 03/21/2018) 60 tablet 5    Prior to Admission medications   Medication Sig Start Date End Date Taking? Authorizing Provider  acetaminophen (TYLENOL) 500 MG tablet Take 1,000 mg by mouth daily as needed for mild pain.   Yes [provider]  bisacodyl (DULCOLAX) 10 MG suppository Place 10 mg rectally as needed for moderate constipation.   Yes [provider]  calcium carbonate (TUMS - DOSED IN MG ELEMENTAL CALCIUM) 500 MG chewable tablet Chew 1 tablet by mouth 3 (three) times daily as needed for indigestion or heartburn.   Yes [provider]  dexamethasone (DECADRON) 4 MG tablet Take 3 po qd Patient taking differently: Take 1-3 mg by mouth See admin instructions. Take three tablets three times daily with meals for three days through Tuesday 03-21-18. Then take two tablets three times daily with meals on 03-22-18. Then take one tablet three times daily with meals on 03-23-18. Then take one tablet twice daily with meals on 03-24-18. Then STOP. 02/21/18  Yes Sater, Richard A, MD  gabapentin (NEURONTIN) 300 MG capsule Take 300 mg by mouth 3 (three) times daily.   Yes [provider]  HYDROcodone-acetaminophen (NORCO/VICODIN) 5-325 MG tablet Take 1-2 tablets by mouth every 4 (four) hours as needed for moderate pain (one tablet for moderate pain and two tablets for severe pain).   Yes [provider]  lisinopril-hydrochlorothiazide (PRINZIDE,ZESTORETIC) 20-12.5 MG tablet Take one tablet by mouth once daily. 02/06/18  Yes [provider]  methocarbamol (ROBAXIN) 500 MG tablet Take 500 mg by mouth 2 (two) times daily. For ten days   Yes [provider]  omeprazole (PRILOSEC) 20 MG capsule Take 20 mg by mouth 2 (two) times daily before a meal.   Yes [provider]  polyethylene glycol (MIRALAX / GLYCOLAX) packet Take 17 g by mouth daily as needed for mild constipation.   Yes [provider]  pyridoxine (B-6) 200 MG tablet Take 200 mg by mouth daily.   Yes [provider]  senna (SENOKOT) 8.6 MG TABS tablet Take 2 tablets by mouth See admin instructions. Twice daily while taking Norco to avoid constipation.   Yes [provider]  tamsulosin (FLOMAX) 0.4 MG CAPS capsule Take 0.4 mg by mouth daily   after supper.   Yes [provider]  vitamin C (ASCORBIC ACID) 500 MG tablet Take 500 mg by mouth 2 (two) times daily. 03/08/18  Yes [provider]  oxybutynin (DITROPAN) 5 MG tablet Take 1 tablet (5 mg total) by mouth 2 (two) times daily. Patient not taking: Reported on 03/21/2018 02/15/18   Sater, Richard A, MD    Blood pressure 126/81, pulse 70, temperature 98.1 F (36.7 C), resp. rate 18, height 5' 4" (1.626 m), weight 101.7 kg (224 lb 3.3 oz), SpO2 98 %. Physical Exam: General: pleasant, WD/WN white female who is laying in bed in NAD HEENT: head is normocephalic, atraumatic.  Sclera are noninjected.  Pupils equal and round.  Ears and nose without any masses or lesions.  Mouth is pink and moist. Dentition fair Heart: regular, rate, and rhythm.  No obvious murmurs, gallops, or rubs noted.  Palpable pedal pulses bilaterally Lungs: CTAB, no wheezes, rhonchi, or rales noted.  Respiratory effort nonlabored Abd: multiple well healed lap incisions, distended but soft, hypoactive BS, no masses, hernias, or organomegaly. Mild  TTP lower abdominal quadrants MS: calves soft and nontender Skin: warm and dry with no masses, lesions, or rashes Psych: A&Ox3 with an appropriate affect. Neuro: cranial nerves grossly intact, BLE weakness and sensory deficit, normal speech  Results for orders placed or performed during the hospital encounter of 03/21/18 (from the past 48 hour(s))  Basic metabolic panel     Status: Abnormal   Collection Time: 03/23/18  5:29 AM  Result Value Ref Range   Sodium 134 (L) 135 - 145 mmol/L   Potassium 2.5 (LL) 3.5 - 5.1 mmol/L    Comment: CRITICAL RESULT CALLED TO, READ BACK BY AND VERIFIED WITH: A CHRISTIAN RN [phone] 03/23/2018 BY A BENNETT    Chloride 97 (L) 101 - 111 mmol/L   CO2 29 22 - 32 mmol/L   Glucose, Bld 99 65 - 99 mg/dL   BUN 12 6 - 20 mg/dL   Creatinine, Ser 0.55 0.44 - 1.00 mg/dL   Calcium 8.6 (L) 8.9 - 10.3 mg/dL   GFR calc non Af Amer >60 >60 mL/min   GFR calc Af Amer >60 >60 mL/min    Comment: (NOTE) The eGFR has been calculated using the CKD EPI equation. This calculation has not been validated in all clinical situations. eGFR's persistently <60 mL/min signify possible Chronic Kidney Disease.    Anion gap 8 5 - 15    Comment: Performed at [facility], [address] [place], [place] [address]  Basic metabolic panel     Status: Abnormal   Collection Time: 03/24/18  5:17 AM  Result Value Ref Range   Sodium 129 (L) 135 - 145 mmol/L   Potassium 4.0 3.5 - 5.1 mmol/L   Chloride 98 (L) 101 - 111 mmol/L   CO2 21 (L) 22 - 32 mmol/L   Glucose, Bld 111 (H) 65 - 99 mg/dL   BUN 12 6 - 20 mg/dL   Creatinine, Ser 0.62 0.44 - 1.00 mg/dL   Calcium 9.0 8.9 - 10.3 mg/dL   GFR calc non Af Amer >60 >60 mL/min   GFR calc Af Amer >60 >60 mL/min    Comment: (NOTE) The eGFR has been calculated using the CKD EPI equation. This calculation has not been validated in all clinical situations. eGFR's persistently <60 mL/min signify possible Chronic Kidney Disease.    Anion gap 10  5 - 15    Comment: Performed at [facility], [address] [place], [place] [address]   Dg Abd 1 View  Result Date: 03/24/2018 CLINICAL DATA:  Abdominal distension and pain. EXAM: ABDOMEN - 1 VIEW COMPARISON:  03/23/2018 and prior radiograph FINDINGS: Gaseous distension small bowel and colon again identified with the cecum measuring up to 13 cm. Slightly increased small bowel distension noted. No other changes identified. IMPRESSION: Slightly increased bowel distension/ileus. Electronically Signed   By: Jeffrey  Hu M.D.   On: 03/24/2018 12:09   Dg Abd 1 View  Result Date: 03/23/2018 CLINICAL DATA:  Ileus. EXAM: ABDOMEN - 1 VIEW COMPARISON:  Radiograph of Mar 22, 2018. FINDINGS: Stable dilated large and small bowel loops are noted most consistent with ileus. Cecum currently measures 12.3 cm which is not significantly changed compared to prior exam. Status post cholecystectomy. Phleboliths are noted in the pelvis. IMPRESSION: Stable ileus as described above. Electronically Signed   By: James  Green Jr, M.D.   On: 03/23/2018 10:49    Anti-infectives (From admission, onward)   Start     Dose/Rate Route Frequency Ordered Stop   03/24/18 1000  cefTRIAXone (ROCEPHIN) 1 g in sodium chloride 0.9 % 100 mL IVPB     1 g 200 mL/hr over 30 Minutes Intravenous Every 24 hours 03/24/18 0855     03/23/18 1400  cephALEXin (KEFLEX) 250 MG/5ML suspension 500 mg  Status:  Discontinued     500 mg Oral Every 8 hours 03/23/18 1134 03/24/18 0855       Assessment/Plan Thoracic myelopathy s/p T7-T8 tumor resection by Dr. Wilson at [facility] 03/13/18 Paraplegia Left wrist fracture s/p ORIF  Adynamic ileus of the small bowel and colon - Patient with persistent ileus and cecum measuring 13cm on xray. Clinically stable with no signs of bowel perforation. She does not need any urgent surgical intervention. Recommend GI consult for possible decompression or neostigmine. Check BMP, mag, and CBC. Recommend trying to keep  potassium >4 and magnesium >2. limit narcotics. Patient waiting to go to radiology to retry placing NG tube. Continue NPO. General surgery will follow.  ID - keflex 5/9>>5/10, rocephin 5/10>> VTE - SCDs FEN - IVF, NPO  Brooke A Meuth, PA-C [facility] 03/24/2018, 1:36 PM Pager: [phone] Consults: [phone] Mon-Fri 7:00 am-4:30 pm Sat-Sun 7:00 am-11:30 am  

## 2018-03-24 NOTE — IPOC Note (Signed)
Overall Plan of Care [facility]) Patient Details Name: LANGLEY FLATLEY MRN: [id number] DOB: 08/22/1956  Admitting Diagnosis: Thoracic myelopathy  Hospital Problems: Principal Problem:   Thoracic myelopathy Active Problems:   Neurogenic bowel   Neurogenic bladder   Left wrist fracture, sequela     Functional Problem List: Nursing Bladder, Bowel, Edema, Endurance, Medication Management, Safety, Skin Integrity, Sensory  PT Balance, Pain, Endurance, Sensory, Motor, Safety  OT Balance, Endurance, Motor, Pain, Sensory  SLP    TR         Basic ADL's: OT Bathing, Dressing, Toileting     Advanced  ADL's: OT       Transfers: PT Bed Mobility, Bed to Chair, Car, Manufacturing systems engineer, Metallurgist: PT Ambulation, Emergency planning/management officer, Stairs     Additional Impairments: OT None  SLP        TR      Anticipated Outcomes Item Anticipated Outcome  Self Feeding Independent  Swallowing      Basic self-care  min A LB self care  Toileting  min A   Bathroom Transfers min A  Bowel/Bladder  Mod assist  Transfers  min assist  Locomotion  supervision w/c level, will plan to address ambulation when safe  Communication     Cognition     Pain  3 or less  Safety/Judgment  Min assist   Therapy Plan: PT Intensity: Minimum of 1-2 x/day ,45 to 90 minutes PT Frequency: 5 out of 7 days PT Duration Estimated Length of Stay: 21-24 days OT Intensity: Minimum of 1-2 x/day, 45 to 90 minutes OT Frequency: 5 out of 7 days OT Duration/Estimated Length of Stay: 26-28 days      Team Interventions: Nursing Interventions Disease Management/Prevention, Patient/Family Education, Skin Care/Wound Management, Bladder Management, Bowel Management, Medication Management, Discharge Planning  PT interventions Ambulation/gait training, Discharge planning, DME/adaptive equipment instruction, Functional mobility training, Pain management, Psychosocial support, Splinting/orthotics,  Therapeutic Activities, UE/LE Strength taining/ROM, Wheelchair propulsion/positioning, UE/LE Coordination activities, Therapeutic Exercise, Stair training, Patient/family education, Neuromuscular re-education, Functional electrical stimulation, Disease management/prevention, Academic librarian, Training and development officer  OT Interventions Training and development officer, Discharge planning, DME/adaptive equipment instruction, Functional mobility training, Neuromuscular re-education, Psychosocial support, Patient/family education, Self Care/advanced ADL retraining, Therapeutic Activities, UE/LE Strength taining/ROM, Therapeutic Exercise, UE/LE Coordination activities  SLP Interventions    TR Interventions    SW/CM Interventions Discharge Planning, Psychosocial Support, Patient/Family Education   Barriers to Discharge MD  Medical stability, current ileus  Nursing      PT      OT      SLP      SW       Team Discharge Planning: Destination: PT-Home ,OT- Home , SLP-  Projected Follow-up: PT-Home health PT, 24 hour supervision/assistance, OT-  Outpatient OT, SLP-  Projected Equipment Needs: PT- , OT- Tub/shower bench, 3 in 1 bedside comode, SLP-  Equipment Details: PT- , OT-  Patient/family involved in discharge planning: PT- Patient, Family member/caregiver,  OT-Family member/caregiver, Patient, SLP-   MD ELOS: 22-26 days Medical Rehab Prognosis:  Excellent Assessment: The patient has been admitted for CIR therapies with the diagnosis of thoracic myelopathy. The team will be addressing functional mobility, strength, stamina, balance, safety, adaptive techniques and equipment, self-care, bowel and bladder mgt, patient and caregiver education, NMR, orthotics, pain mgt, back precautions, wound care. Goals have been set at min assist for self-care and transfers, supervision for w/c mobility.      Meredith Staggers, MD, Mellody Drown  See Team Conference Notes for weekly updates to the plan  of care

## 2018-03-24 NOTE — Progress Notes (Signed)
[facility] PHYSICAL MEDICINE & REHABILITATION     PROGRESS NOTE    Subjective/Complaints: Had a rough night. Stomach tight, crampy. Had a small bm last night and during the day.   ROS: Patient denies fever, rash, sore throat, blurred vision,  cough, shortness of breath or chest pain, joint  pain, headache, or mood change.    Objective: Vital Signs: Blood pressure 126/81, pulse 70, temperature 98.1 F (36.7 C), resp. rate 18, height 5\' 4"  (1.626 m), weight 101.7 kg (224 lb 3.3 oz), SpO2 98 %. Dg Abd 1 View  Result Date: 03/23/2018 CLINICAL DATA:  Ileus. EXAM: ABDOMEN - 1 VIEW COMPARISON:  Radiograph of Mar 22, 2018. FINDINGS: Stable dilated large and small bowel loops are noted most consistent with ileus. Cecum currently measures 12.3 cm which is not significantly changed compared to prior exam. Status post cholecystectomy. Phleboliths are noted in the pelvis. IMPRESSION: Stable ileus as described above. Electronically Signed   By: Marijo Conception, M.D.   On: 03/23/2018 10:49   Recent Labs    03/22/18 0549  WBC 6.4  HGB 12.8  HCT 37.1  PLT 227   Recent Labs    03/23/18 0529 03/24/18 0517  NA 134* 129*  K 2.5* 4.0  CL 97* 98*  GLUCOSE 99 111*  BUN 12 12  CREATININE 0.55 0.62  CALCIUM 8.6* 9.0   CBG (last 3)  No results for input(s): GLUCAP in the last 72 hours.  Wt Readings from Last 3 Encounters:  03/21/18 101.7 kg (224 lb 3.3 oz)  03/20/18 93.4 kg (206 lb)  02/15/18 94.1 kg (207 lb 8 oz)    Physical Exam:  Constitutional: No distress . Vital signs reviewed. HEENT: EOMI, oral membranes moist Neck: supple Cardiovascular: RRR without murmur. No JVD    Respiratory: CTA Bilaterally without wheezes or rales. Normal effort    GI: high pitched, scarce bowels sounds, distended. Sl tender Musculoskeletal: She exhibits noedema. Left arm in splint. Fingers bruised, no swelling Neurological: Motor RUE 5/5. LUE limited by splint can lift arm and wiggle all fingers.  Decreased sensation to LT and proprioception, left greater than right brelow T8 dermatome. LE motor 2+ to 3/5 HF, KE and 3/5---motor and sensory exam stable.  ADF/PF. DTR's 1+. No resting tone. Skin: Back incision CDI.   Psychiatric:  Pleasant and cooperative   Assessment/Plan: 1. Paraplegia and functional deficits secondary to T8 myelopathy, left wrist fracture which require 3+ hours per day of interdisciplinary therapy in a comprehensive inpatient rehab setting. Physiatrist is providing close team supervision and 24 hour management of active medical problems listed below. Physiatrist and rehab team continue to assess barriers to discharge/monitor patient progress toward functional and medical goals.  Function:  Bathing Bathing position Bathing activity did not occur: N/A Position: Bed(LB, UB EOB)  Bathing parts Body parts bathed by patient: Chest, Abdomen, Left arm Body parts bathed by helper: Right arm, Front perineal area, Buttocks, Right upper leg, Left upper leg, Right lower leg, Left lower leg, Back  Bathing assist        Upper Body Dressing/Undressing Upper body dressing   What is the patient wearing?: Bra, Pull over shirt/dress Bra - Perfomed by patient: Thread/unthread left bra strap, Thread/unthread right bra strap Bra - Perfomed by helper: Hook/unhook bra (pull down sports bra) Pull over shirt/dress - Perfomed by patient: Thread/unthread right sleeve, Thread/unthread left sleeve Pull over shirt/dress - Perfomed by helper: Put head through opening, Pull shirt over trunk  Upper body assist        Lower Body Dressing/Undressing Lower body dressing   What is the patient wearing?: Pants, Non-skid slipper socks       Pants- Performed by helper: Thread/unthread right pants leg, Thread/unthread left pants leg, Pull pants up/down   Non-skid slipper socks- Performed by helper: Don/doff right sock, Don/doff left sock                  Lower body assist         Toileting Toileting     Toileting steps completed by helper: Adjust clothing prior to toileting    Toileting assist Assist level: Two helpers   Transfers Chair/bed transfer   Chair/bed transfer method: Other Chair/bed transfer assist level: dependent (Pt equals 0%) Chair/bed transfer assistive device: Mechanical lift Mechanical lift: Stedy   Locomotion Ambulation Ambulation activity did not occur: Safety/medical concerns         Wheelchair   Type: Manual Max wheelchair distance: 150 Assist Level: Dependent (Pt equals 0%)  Cognition Comprehension Comprehension assist level: Follows complex conversation/direction with no assist  Expression Expression assist level: Expresses complex ideas: With no assist  Social Interaction Social Interaction assist level: Interacts appropriately with others - No medications needed.  Problem Solving Problem solving assist level: Solves complex 90% of the time/cues < 10% of the time  Memory Memory assist level: Complete Independence: No helper   Medical Problem List and Plan: 1.Paraplegia and funtional deficitssecondary to T8 meningioma with myeopathy -recent left wrist fracture -continue PT, OT 2. DVT Prophylaxis/right peroneal QIW:LNLGXQJJHERDEY:CXKGYJE -repeat dopplers next week. 3. Pain Management:Hydrocodone prn 4. Mood:LCSW to follow for evaluation and support. 5. Neuropsych: This patientiscapable of making decisions on herown behalf. 6. Skin/Wound Care:Routine pressure relief measures. 7. Fluids/Electrolytes/Nutrition:Monitor I/O.    -potassium 4 today  -continue k+ in IVF, dc po k+ 8. Post op ileus/neurogenic bowel:  -kub without substantial change from yesterday  -belly appears more distended today   -make NPO  -begin IV reglan  -SMOG enema today 9.Neurogenic bladder:foley out today, voiding trial/caths  -E coli UTI--change to ctx until po advanced.  10.  GERD:managed withPPIbid. 11. Hyponatremia: down to 129 today.   -hctz stopped  -begin D5NS IV  12.Leftwrist fracture: NWB.    LOS (Days) 3 A FACE TO FACE EVALUATION WAS PERFORMED  Meredith Staggers, MD 03/24/2018 8:47 AM

## 2018-03-24 NOTE — Plan of Care (Signed)
  Problem: Consults Goal: RH SPINAL CORD INJURY PATIENT EDUCATION Description:  See Patient Education module for education specifics.  Outcome: Progressing   

## 2018-03-24 NOTE — Progress Notes (Signed)
Subjective:    16 days PO left distal radius ORIF  Patient reports pain as 3 on 0-10 scale.   Hannah Jefferson is doing well. She is laying in her bed after having the NG tube placed about an hour ago. The wrist is causing some discomfort but she states that it is tolerable. The pain is alleviated with elevation and ice. She has swelling and ecchymosis throughout the hand and wrist.  She is denies numbness, tingling, chest pain, or shortness of breath.   Objective: Vital signs in last 24 hours: Temp:  [99.7 F (37.6 C)] 99.7 F (37.6 C) (05/10 1524) Pulse Rate:  [70-96] 96 (05/10 1524) Resp:  [20] 20 (05/10 1524) BP: (109-129)/(78-81) 109/80 (05/10 1524) SpO2:  [95 %] 95 % (05/10 1524)  Intake/Output from previous day: 05/09 0701 - 05/10 0700 In: 903.8 [P.O.:360; I.V.:543.8] Out: 80 [Urine:80] Intake/Output this shift: Total I/O In: -  Out: 330 [Emesis/NG output:300; Other:30]  Recent Labs    03/22/18 0549 03/24/18 1415  HGB 12.8 15.3*   Recent Labs    03/22/18 0549 03/24/18 1415  WBC 6.4 15.5*  RBC 4.37 5.14*  HCT 37.1 44.5  PLT 227 355   Recent Labs    03/24/18 0517 03/24/18 1313  NA 129* 131*  K 4.0 3.6  CL 98* 98*  CO2 21* 22  BUN 12 14  CREATININE 0.62 0.66  GLUCOSE 111* 233*  CALCIUM 9.0 9.1   No results for input(s): LABPT, INR in the last 72 hours.   A&O x 4 Sugar tong splint C/D/I Neurovascular intact Sensation intact distally Intact pulses distally  Moderate ecchymosis and swelling throughout the hand and wrist.  Sutures intact and incision is appropriately aligned. No erythema, warmth, or purulent drainage. Able to make a partial fist, cross fingers, and abduct thumb.     Assessment/Plan:    Left distal radius open reduction and internal fixation  The sugar tong splint was removed. Sutures removed and steri-strips were applied.  Hannah Jefferson was put into a short arm cast today. Cast care protocol was reviewed.  Encouraged her to use her fingers  and work on making a fist and touching her thumb to each of her finger tips.  Continue with pain management as needed.  PA/Lat views of the left wrist to be done in the cast ordered.  We will plan on seeing the patient back in about 2 or 3 weeks for reassessment. If she is out of the hospital at that point, then she will follow-up in our office.     Hannah Jefferson 03/24/2018, 4:25 PM

## 2018-03-24 NOTE — Progress Notes (Signed)
Social Work  Social Work Assessment and Plan  Patient Details  Name: Hannah Jefferson MRN: [id number] Date of Birth: 11/24/1955  Today's Date: 03/24/2018  Problem List:  Patient Active Problem List   Diagnosis Date Noted  . Thoracic myelopathy 03/21/2018  . Neurogenic bowel 03/21/2018  . Neurogenic bladder 03/21/2018  . Left wrist fracture, sequela 03/21/2018  . Numbness 02/15/2018  . Ataxic gait 02/15/2018  . Left leg weakness 02/15/2018  . Other fatigue 02/15/2018  . Urinary incontinence 02/15/2018  . Bowel incontinence 02/15/2018   Past Medical History:  Past Medical History:  Diagnosis Date  . Chronic left hip pain   . GERD (gastroesophageal reflux disease)   . Gestational diabetes   . Hearing loss of both ears   . Hypertension   . Neuropathy    numbness/tingling from waist down  . Vision abnormalities    Past Surgical History:  Past Surgical History:  Procedure Laterality Date  . LAPAROSCOPIC CHOLECYSTECTOMY  1992  . REFRACTIVE SURGERY Bilateral 2004   Social History:  reports that she has never smoked. She has never used smokeless tobacco. She reports that she does not drink alcohol or use drugs.  Family / Support Systems Marital Status: Married How Long?: 35 yrs Patient Roles: Spouse, Parent Spouse/Significant Other: spouse, Caela Huot @ (C) [phone] Children: son, [name] and son, Thedore Mins both living locally and involved Other Supports: sister-in-law, Ulice Brilliant @ (H) [phone] or [phone] Anticipated Caregiver: Husband and sister in law Ability/Limitations of Caregiver: Husband works days.  [name] can assist during the day as she is retired.  Has friends who can also assist while husband works. Caregiver Availability: 24/7 Family Dynamics: Pt describes multiple family members who are all very supportive.  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   Abuse/Neglect Abuse/Neglect Assessment Can Be Completed: Yes Physical Abuse: Denies Verbal Abuse: Denies Sexual Abuse: Denies Exploitation of patient/patient's resources: Denies Self-Neglect: Denies  Emotional Status Pt's affect, behavior adn adjustment status: Pt lying in bed and notes ongoing discomfort with her belly but is extremely pleasant and completes interview without any difficulty.  She denies any significant emotional distress and sister-in-law describes her as "the most positive person you'll ever meet." Pt does acknowledge that she has "a lot to deal with right now" but she is hopeful to make good progress on CIR.  May benefit from neuropsychology support - will monitor. Recent Psychosocial Issues: Has been losing strength and function since October. Pyschiatric History: None Substance Abuse History: None  Patient / Family Perceptions, Expectations & Goals Pt/Family understanding of illness & functional limitations: Pt and family with good understanding of her meningioma and hopeful that path will confirm it is benign as suspected by MD.    Good understanding of her current functional limitations/ need for CIR. Premorbid pt/family roles/activities: As noted, pt has been losing strength/ sensation in LE since October. Anticipated changes in roles/activities/participation: Per goals of min/ mod assist, spouse and family are prepared to provide all caregiver support needed. Pt/family expectations/goals: "I would really like to be able to get around in my  home."  [employer]: None Premorbid Home Care/DME Agencies: None Transportation available at discharge: yes Resource referrals recommended: Neuropsychology  Discharge Planning Living Arrangements: Spouse/significant other Support Systems: Spouse/significant other, Children, Other relatives, Water engineer, Social worker community Type of Residence: Private residence Insurance Resources: Multimedia programmer (specify)([employer]) Financial Resources: Employment Financial Screen Referred: No Living Expenses: Higher education careers adviser Management: Spouse Does the patient have any problems obtaining your medications?: No Home Management: pt and spouse Patient/Family Preliminary Plans: Pt to return to her own home which is already ramped. Social Work Anticipated Follow Up Needs: HH/OP Expected length of stay: 21-24 days  Clinical Impression Very pleasant, optimistic woman here following surgery for thoracic meningioma and recent wrist fx.  She denies any significant emotional distress and family notes she is "the most positive person you'll ever meet."  Family prepared and able to provide 24/7 assistance.  Will follow for support and d/c planning needs.  Will monitor emotional adjustment and refer for neuropsychology if indicated.  [name] 03/24/2018, 11:30 AM

## 2018-03-24 NOTE — Progress Notes (Signed)
Physical Therapy Note  Patient Details  Name: NAVY ROTHSCHILD MRN: [id number] Date of Birth: 10-27-1956 Today's Date: 03/24/2018    Pt preparing to go off unit to IR for NG tube placement.  Has been (+) for nausea/vomitting throughout the day. Will f/u per POC.   Shann Medal, PT, DPT    03/24/2018, 1:57 PM

## 2018-03-24 NOTE — Progress Notes (Signed)
Physical Therapy Note  Patient Details  Name: Hannah Jefferson MRN: [id number] Date of Birth: April 30, 1956 Today's Date: 03/24/2018   Attempted to see pt for scheduled therapy session, pt declines to participate secondary to pain and stomach cramping from ongoing constipation. RN and medical team aware of pt's constipation and are addressing it. Continue per POC. Pt missed 60 minutes of scheduled therapy time due to pain and illness.   Excell Seltzer, PT, DPT  03/24/2018, 10:48 AM

## 2018-03-24 NOTE — Progress Notes (Addendum)
Addendum:   Pt with persistent ileus. No results with SMOG enema. KUB unchanged. Pt uncomfortable. Will pursue placement of NGT to suction. Have made her completely NPO. Needed meds changed to IV form for now.    Update: Nursing unable to pass NGT as pt vomited up what appears to be stool during insertion. Will request NGT to be placed by radiology. Have contacted general surgery as well.  Meredith Staggers, MD, Mellody Drown

## 2018-03-24 NOTE — Consult Note (Signed)
Referring Provider:  Dr. Naaman Plummer Primary Care Physician:  Carol Ada, MD Primary Gastroenterologist:  unassigned  Reason for Consultation:  Ileus  HPI: Hannah Jefferson is a 62 y.o. female With past medical history of T7 -T 8  Mass with marked cord compression. possible meningioma. Underwent tumor resection at [facility]. Patient developed adynamic ileus postoperatively. treated conservatively with NG suction which was removed on 05/05/  Patient was subsequently transferred to [facility] for rehabilitation on 03/21/2018. Patient continues to have a abdominal distention.repeat x-ray showed distention of the small bowel as well as colon with cecum measuring up to 13 cm.surgery and GI consulted for further management.  Patient seen and examined at bedside. She just came back from interventional radiology after NG tube placement. She is already feeling better. Abdominal distention is improving. Abdominal pain is also better. Discussed with the nursing staff. She is currently having loose stools with last bowel movement this morning.Patient did had an episode of vomiting while she was getting an NG tube placement. No evidence of bleeding.     Past Medical History:  Diagnosis Date  . Chronic left hip pain   . GERD (gastroesophageal reflux disease)   . Gestational diabetes   . Hearing loss of both ears   . Hypertension   . Neuropathy    numbness/tingling from waist down  . Vision abnormalities     Past Surgical History:  Procedure Laterality Date  . LAPAROSCOPIC CHOLECYSTECTOMY  1992  . REFRACTIVE SURGERY Bilateral 2004    Prior to Admission medications   Medication Sig Start Date End Date Taking? Authorizing Provider  acetaminophen (TYLENOL) 500 MG tablet Take 1,000 mg by mouth daily as needed for mild pain.   Yes [provider]  bisacodyl (DULCOLAX) 10 MG suppository Place 10 mg rectally as needed for moderate constipation.   Yes [provider]  calcium carbonate  (TUMS - DOSED IN MG ELEMENTAL CALCIUM) 500 MG chewable tablet Chew 1 tablet by mouth 3 (three) times daily as needed for indigestion or heartburn.   Yes [provider]  dexamethasone (DECADRON) 4 MG tablet Take 3 po qd Patient taking differently: Take 1-3 mg by mouth See admin instructions. Take three tablets three times daily with meals for three days through Tuesday 03-21-18. Then take two tablets three times daily with meals on 03-22-18. Then take one tablet three times daily with meals on 03-23-18. Then take one tablet twice daily with meals on 03-24-18. Then STOP. 02/21/18  Yes Sater, Nanine Means, MD  gabapentin (NEURONTIN) 300 MG capsule Take 300 mg by mouth 3 (three) times daily.   Yes [provider]  HYDROcodone-acetaminophen (NORCO/VICODIN) 5-325 MG tablet Take 1-2 tablets by mouth every 4 (four) hours as needed for moderate pain (one tablet for moderate pain and two tablets for severe pain).   Yes [provider]  lisinopril-hydrochlorothiazide (PRINZIDE,ZESTORETIC) 20-12.5 MG tablet Take one tablet by mouth once daily. 02/06/18  Yes [provider]  methocarbamol (ROBAXIN) 500 MG tablet Take 500 mg by mouth 2 (two) times daily. For ten days   Yes [provider]  omeprazole (PRILOSEC) 20 MG capsule Take 20 mg by mouth 2 (two) times daily before a meal.   Yes [provider]  polyethylene glycol (MIRALAX / GLYCOLAX) packet Take 17 g by mouth daily as needed for mild constipation.   Yes [provider]  pyridoxine (B-6) 200 MG tablet Take 200 mg by mouth daily.   Yes [provider]  senna (  SENOKOT) 8.6 MG TABS tablet Take 2 tablets by mouth See admin instructions. Twice daily while taking Norco to avoid constipation.   Yes [provider]  tamsulosin (FLOMAX) 0.4 MG CAPS capsule Take 0.4 mg by mouth daily after supper.   Yes [provider]  vitamin C (ASCORBIC ACID) 500 MG tablet Take 500 mg by mouth 2 (two)  times daily. 03/08/18  Yes [provider]  oxybutynin (DITROPAN) 5 MG tablet Take 1 tablet (5 mg total) by mouth 2 (two) times daily. Patient not taking: Reported on 03/21/2018 02/15/18   Britt Bottom, MD    Scheduled Meds: . dexamethasone  1 mg Intravenous BID  . enoxaparin (LOVENOX) injection  40 mg Subcutaneous Q24H  . lidocaine      . lisinopril  20 mg Oral Daily  . methocarbamol  500 mg Oral BID  . metoCLOPramide (REGLAN) injection  10 mg Intravenous Q6H  . pantoprazole  20 mg Oral BID  . vitamin B-6  200 mg Oral Daily  . tamsulosin  0.4 mg Oral QPC supper  . vitamin C  500 mg Oral BID   Continuous Infusions: . cefTRIAXone (ROCEPHIN)  IV Stopped (03/24/18 1200)  . dextrose 5 % and 0.9 % NaCl with KCl 40 mEq/L 75 mL/hr at 03/24/18 1022   PRN Meds:.acetaminophen, alum & mag hydroxide-simeth, bisacodyl, diphenhydrAMINE, guaiFENesin-dextromethorphan, lidocaine, polyethylene glycol, prochlorperazine **OR** prochlorperazine **OR** prochlorperazine, traZODone  Allergies as of 03/21/2018  . (No Known Allergies)    Family History  Problem Relation Age of Onset  . Dementia Mother   . COPD Mother   . Heart disease Father   . Thyroid nodules Sister   . Diabetes Mellitus II Sister   . Heart disease Brother   . Diabetes Mellitus II Brother   . Diabetes Mellitus II Brother   . Heart disease Brother     Social History   Socioeconomic History  . Marital status: Married    Spouse name: Not on file  . Number of children: Not on file  . Years of education: Not on file  . Highest education level: Not on file  Occupational History  . Not on file  Social Needs  . Financial resource strain: Not on file  . Food insecurity:    Worry: Not on file    Inability: Not on file  . Transportation needs:    Medical: Not on file    Non-medical: Not on file  Tobacco Use  . Smoking status: Never Smoker  . Smokeless tobacco: Never Used  Substance and Sexual Activity  . Alcohol  use: Never    Frequency: Never  . Drug use: Never  . Sexual activity: Not on file  Lifestyle  . Physical activity:    Days per week: Not on file    Minutes per session: Not on file  . Stress: Not on file  Relationships  . Social connections:    Talks on phone: Not on file    Gets together: Not on file    Attends religious service: Not on file    Active member of club or organization: Not on file    Attends meetings of clubs or organizations: Not on file    Relationship status: Not on file  . Intimate partner violence:    Fear of current or ex partner: Not on file    Emotionally abused: Not on file    Physically abused: Not on file    Forced sexual activity: Not on file  Other Topics Concern  . Not on file  Social History Narrative  . Not on file    Review of Systems: limited ROS per HPI  Physical Exam: Vital signs: Vitals:   03/23/18 2051 03/24/18 0612  BP: 129/78 126/81  Pulse: 90 70  Resp:    Temp:    SpO2:     Last BM Date: 03/24/18 General:   Alert oriented 3. Not in acute distress. NG tube in place. HEENT : NS, AT, EOMI Oral mucosa moist Heart : RRR Lungs: CTAB ABD : hyperactive bowel sounds, abdomen distended but soft, generalized discomfort without any tenderness. No peritoneal signs. LE: no edema Psych : mood and affect normal.  GI:  Lab Results: Recent Labs    03/22/18 0549 03/24/18 1415  WBC 6.4 15.5*  HGB 12.8 15.3*  HCT 37.1 44.5  PLT 227 355   BMET Recent Labs    03/23/18 0529 03/24/18 0517 03/24/18 1313  NA 134* 129* 131*  K 2.5* 4.0 3.6  CL 97* 98* 98*  CO2 29 21* 22  GLUCOSE 99 111* 233*  BUN 12 12 14   CREATININE 0.55 0.62 0.66  CALCIUM 8.6* 9.0 9.1   LFT Recent Labs    03/22/18 0549  PROT 4.6*  ALBUMIN 2.6*  AST 29  ALT 52  ALKPHOS 45  BILITOT 0.9   PT/INR No results for input(s): LABPROT, INR in the last 72 hours.   Studies/Results: Dg Abd 1 View  Result Date: 03/24/2018 CLINICAL DATA:  Abdominal  distension and pain. EXAM: ABDOMEN - 1 VIEW COMPARISON:  03/23/2018 and prior radiograph FINDINGS: Gaseous distension small bowel and colon again identified with the cecum measuring up to 13 cm. Slightly increased small bowel distension noted. No other changes identified. IMPRESSION: Slightly increased bowel distension/ileus. Electronically Signed   By: Margarette Canada M.D.   On: 03/24/2018 12:09   Dg Abd 1 View  Result Date: 03/23/2018 CLINICAL DATA:  Ileus. EXAM: ABDOMEN - 1 VIEW COMPARISON:  Radiograph of Mar 22, 2018. FINDINGS: Stable dilated large and small bowel loops are noted most consistent with ileus. Cecum currently measures 12.3 cm which is not significantly changed compared to prior exam. Status post cholecystectomy. Phleboliths are noted in the pelvis. IMPRESSION: Stable ileus as described above. Electronically Signed   By: Marijo Conception, M.D.   On: 03/23/2018 10:49    Impression/Plan: - postoperative ileus. Symptoms improving after NG tube placement.  Recommendations -------------------------- - Continue supportive care for now. Keep potassium more than 4. Patient is already feeling better with NG tube placement. Repeat x-ray in the morning. If continues to have significant cecal distention, consider flexible sigmoidoscopy for decompression. Keep nothing by mouth for now. GI will follow.    LOS: 3 days   Otis Brace  MD, FACP 03/24/2018, 3:09 PM  Contact #  [phone]

## 2018-03-24 NOTE — Progress Notes (Signed)
Attempted to place NGT twice. Pt had episode of emesis, vomiting up bowel movement and vomited first NGT. Second NGT was coughed through pt's mouth. MD notified, radiology notified.

## 2018-03-25 ENCOUNTER — Inpatient Hospital Stay: Payer: Self-pay

## 2018-03-25 ENCOUNTER — Inpatient Hospital Stay (HOSPITAL_COMMUNITY): Payer: 59

## 2018-03-25 ENCOUNTER — Inpatient Hospital Stay (HOSPITAL_COMMUNITY): Payer: 59 | Admitting: Occupational Therapy

## 2018-03-25 ENCOUNTER — Inpatient Hospital Stay (HOSPITAL_COMMUNITY): Payer: 59 | Admitting: Physical Therapy

## 2018-03-25 DIAGNOSIS — Z86718 Personal history of other venous thrombosis and embolism: Secondary | ICD-10-CM

## 2018-03-25 LAB — BASIC METABOLIC PANEL
Anion gap: 6 (ref 5–15)
BUN: 13 mg/dL (ref 6–20)
CO2: 23 mmol/L (ref 22–32)
Calcium: 8.7 mg/dL — ABNORMAL LOW (ref 8.9–10.3)
Chloride: 106 mmol/L (ref 101–111)
Creatinine, Ser: 0.55 mg/dL (ref 0.44–1.00)
GFR calc Af Amer: >60 mL/min (ref >60)
GFR calc non Af Amer: >60 mL/min (ref >60)
Glucose, Bld: 147 mg/dL — ABNORMAL HIGH (ref 65–99)
Potassium: 3.3 mmol/L — ABNORMAL LOW (ref 3.5–5.1)
Sodium: 135 mmol/L (ref 135–145)

## 2018-03-25 LAB — MAGNESIUM: Magnesium: 2.1 mg/dL (ref 1.7–2.4)

## 2018-03-25 LAB — CBC
HCT: 40.9 % (ref 36.0–46.0)
Hemoglobin: 13.8 g/dL (ref 12.0–15.0)
MCH: 29.5 pg (ref 26.0–34.0)
MCHC: 33.7 g/dL (ref 30.0–36.0)
MCV: 87.4 fL (ref 78.0–100.0)
Platelets: 290 10*3/uL (ref 150–400)
RBC: 4.68 MIL/uL (ref 3.87–5.11)
RDW: 14.6 % (ref 11.5–15.5)
WBC: 10.4 10*3/uL (ref 4.0–10.5)

## 2018-03-25 MED ORDER — GENERIC EXTERNAL MEDICATION
Status: DC
Start: ? — End: 2018-03-25

## 2018-03-25 MED ORDER — POTASSIUM CHLORIDE 10 MEQ/100ML IV SOLN
10.0000 meq | INTRAVENOUS | Status: AC
  Administered 2018-03-25 (×2): 10 meq via INTRAVENOUS
  Filled 2018-03-25 (×3): qty 100

## 2018-03-25 MED ORDER — HEPARIN BOLUS VIA INFUSION
3900.0000 [IU] | Freq: Once | INTRAVENOUS | Status: AC
  Administered 2018-03-25: 3900 [IU] via INTRAVENOUS
  Filled 2018-03-25: qty 3900

## 2018-03-25 MED ORDER — HEPARIN (PORCINE) IN NACL 100-0.45 UNIT/ML-% IJ SOLN
1200.0000 [IU]/h | INTRAMUSCULAR | Status: DC
  Administered 2018-03-25: 1200 [IU]/h via INTRAVENOUS
  Filled 2018-03-25: qty 250

## 2018-03-25 MED ORDER — POTASSIUM CHLORIDE 10 MEQ/100ML IV SOLN
10.0000 meq | Freq: Once | INTRAVENOUS | Status: AC
  Administered 2018-03-25: 10 meq via INTRAVENOUS
  Filled 2018-03-25: qty 100

## 2018-03-25 MED ORDER — SODIUM CHLORIDE 0.9% FLUSH
10.0000 mL | INTRAVENOUS | Status: DC | PRN
  Administered 2018-03-28: 10 mL
  Filled 2018-03-25: qty 40

## 2018-03-25 MED ORDER — SODIUM CHLORIDE 0.9% FLUSH
10.0000 mL | Freq: Two times a day (BID) | INTRAVENOUS | Status: DC
  Administered 2018-03-27 – 2018-04-17 (×7): 10 mL

## 2018-03-25 NOTE — Progress Notes (Signed)
Received report from radiology regarding highly mobile thrombus to R groin, calf, perineal. Radiology to notify MD, patient is not to be OOB or receive therapy until provider approval. Dr. Naaman Plummer MD aware.

## 2018-03-25 NOTE — Progress Notes (Signed)
PHYSICAL MEDICINE & REHABILITATION     PROGRESS NOTE    Subjective/Complaints: Feels much better today. Abdomen still distended. Able to sleep.   ROS: Patient denies fever, rash, sore throat, blurred vision,  diarrhea, cough, shortness of breath or chest pain, joint or back pain, headache, or mood change.    Objective: Vital Signs: Blood pressure 113/77, pulse 97, temperature 99.4 F (37.4 C), temperature source Oral, resp. rate 18, height 5\' 4"  (1.626 m), weight 101.7 kg (224 lb 3.3 oz), SpO2 97 %. Dg Wrist 2 Views Left  Result Date: 03/24/2018 CLINICAL DATA:  Left wrist fracture. EXAM: LEFT WRIST - 2 VIEW COMPARISON:  None. FINDINGS: Frontal and lateral views of the left wrist were obtained with the wrist in a fiberglass cast. Screw and plate fixation of the distal radius, bridging a poorly visualized probable fracture of the proximal metaphysis. There is also a probable fracture of the ulnar styloid with distal end radial displacement of distal fragments. IMPRESSION: Hardware fixation of a distal radius fracture with anatomic position and alignment and poorly visualized ulnar styloid fracture. Electronically Signed   By: Claudie Revering M.D.   On: 03/24/2018 20:13   Dg Abd 1 View  Result Date: 03/24/2018 CLINICAL DATA:  Ileus. EXAM: ABDOMEN - 1 VIEW; DG NASO G TUBE PLC W/FL-NO RAD CONTRAST:  None. FLUOROSCOPY TIME:  Fluoroscopy Time:  6 seconds. Radiation Exposure Index (if provided by the fluoroscopic device): 2.3 mGy. Number of Acquired Spot Images: 0 COMPARISON:  Abdominal x-ray from same day at 7:44 a.m. FINDINGS: Nasogastric tube within a large hiatal hernia. Diffuse gaseous distention of small bowel and colon is unchanged. IMPRESSION: Nasogastric tube placed within a large hiatal hernia. Electronically Signed   By: Titus Dubin M.D.   On: 03/24/2018 15:40   Dg Abd 1 View  Result Date: 03/24/2018 CLINICAL DATA:  Abdominal distension and pain. EXAM: ABDOMEN - 1 VIEW  COMPARISON:  03/23/2018 and prior radiograph FINDINGS: Gaseous distension small bowel and colon again identified with the cecum measuring up to 13 cm. Slightly increased small bowel distension noted. No other changes identified. IMPRESSION: Slightly increased bowel distension/ileus. Electronically Signed   By: Margarette Canada M.D.   On: 03/24/2018 12:09   Dg Addison Bailey G Tube Plc W/fl-no Rad  Result Date: 03/24/2018 CLINICAL DATA:  Ileus. EXAM: ABDOMEN - 1 VIEW; DG NASO G TUBE PLC W/FL-NO RAD CONTRAST:  None. FLUOROSCOPY TIME:  Fluoroscopy Time:  6 seconds. Radiation Exposure Index (if provided by the fluoroscopic device): 2.3 mGy. Number of Acquired Spot Images: 0 COMPARISON:  Abdominal x-ray from same day at 7:44 a.m. FINDINGS: Nasogastric tube within a large hiatal hernia. Diffuse gaseous distention of small bowel and colon is unchanged. IMPRESSION: Nasogastric tube placed within a large hiatal hernia. Electronically Signed   By: Titus Dubin M.D.   On: 03/24/2018 15:40   Recent Labs    03/24/18 1415 03/25/18 0502  WBC 15.5* 10.4  HGB 15.3* 13.8  HCT 44.5 40.9  PLT 355 290   Recent Labs    03/24/18 1313 03/25/18 0502  NA 131* 135  K 3.6 3.3*  CL 98* 106  GLUCOSE 233* 147*  BUN 14 13  CREATININE 0.66 0.55  CALCIUM 9.1 8.7*   CBG (last 3)  No results for input(s): GLUCAP in the last 72 hours.  Wt Readings from Last 3 Encounters:  03/21/18 101.7 kg (224 lb 3.3 oz)  03/20/18 93.4 kg (206 lb)  02/15/18 94.1 kg (207 lb 8  oz)    Physical Exam:  Constitutional: No distress . Vital signs reviewed. HEENT: EOMI, oral membranes moist Neck: supple Cardiovascular: RRR without murmur. No JVD    Respiratory: CTA Bilaterally without wheezes or rales. Normal effort    GI: minimal bowel sounds. Sl less distended. Non-tender.  Musculoskeletal: She exhibits noedema. Left arm in [facility]. Full movement of fingers Neurological: Motor RUE 5/5. LUE limited by splint can lift arm and wiggle all  fingers. Decreased sensation to LT and proprioception, left greater than right brelow T8 dermatome. LE motor 2+ to 3/5 HF, KE and 3/5---motor and sensory exam stable.  ADF/PF. DTR's 1+. No resting tone. Skin: Back incision CDI.   Psychiatric:  Pleasant and cooperative   Assessment/Plan: 1. Paraplegia and functional deficits secondary to T8 myelopathy, left wrist fracture which require 3+ hours per day of interdisciplinary therapy in a comprehensive inpatient rehab setting. Physiatrist is providing close team supervision and 24 hour management of active medical problems listed below. Physiatrist and rehab team continue to assess barriers to discharge/monitor patient progress toward functional and medical goals.  Function:  Bathing Bathing position Bathing activity did not occur: N/A Position: (sitting BSC-UB only)  Bathing parts Body parts bathed by patient: Right arm, Left arm, Chest, Abdomen Body parts bathed by helper: Right arm, Front perineal area, Buttocks, Right upper leg, Left upper leg, Right lower leg, Left lower leg, Back  Bathing assist        Upper Body Dressing/Undressing Upper body dressing   What is the patient wearing?: Pull over shirt/dress Bra - Perfomed by patient: Thread/unthread left bra strap, Thread/unthread right bra strap Bra - Perfomed by helper: Hook/unhook bra (pull down sports bra) Pull over shirt/dress - Perfomed by patient: Thread/unthread right sleeve, Thread/unthread left sleeve, Put head through opening Pull over shirt/dress - Perfomed by helper: Put head through opening, Pull shirt over trunk        Upper body assist        Lower Body Dressing/Undressing Lower body dressing Lower body dressing/undressing activity did not occur: N/A What is the patient wearing?: Pants, Non-skid slipper socks       Pants- Performed by helper: Thread/unthread right pants leg, Thread/unthread left pants leg, Pull pants up/down   Non-skid slipper socks-  Performed by helper: Don/doff right sock, Don/doff left sock                  Lower body assist        Toileting Toileting     Toileting steps completed by helper: Performs perineal hygiene(wears bried)    Toileting assist Assist level: Touching or steadying assistance (Pt.75%)   Transfers Chair/bed transfer   Chair/bed transfer method: Other Chair/bed transfer assist level: dependent (Pt equals 0%) Chair/bed transfer assistive device: Mechanical lift Mechanical lift: Stedy   Locomotion Ambulation Ambulation activity did not occur: Safety/medical concerns         Wheelchair   Type: Manual Max wheelchair distance: 150 Assist Level: Dependent (Pt equals 0%)  Cognition Comprehension Comprehension assist level: Follows complex conversation/direction with no assist  Expression Expression assist level: Expresses complex ideas: With no assist  Social Interaction Social Interaction assist level: Interacts appropriately with others - No medications needed.  Problem Solving Problem solving assist level: Solves complex 90% of the time/cues < 10% of the time  Memory Memory assist level: Complete Independence: No helper   Medical Problem List and Plan: 1.Paraplegia and funtional deficitssecondary to T8 meningioma with myeopathy -recent left wrist fracture -continue  PT, OT 2. DVT Prophylaxis/right peroneal RXY:[id number]:OTRRNHA -repeat dopplers next week. 3. Pain Management:Hydrocodone prn 4. Mood:LCSW to follow for evaluation and support. 5. Neuropsych: This patientiscapable of making decisions on herown behalf. 6. Skin/Wound Care:Routine pressure relief measures. 7. Fluids/Electrolytes/Nutrition:Monitor I/O.    -potassium 3.3 today  -continue k+ in IVF  -add runs of vitamin k x 3 8. Post op ileus/neurogenic bowel:  -pt feeling better  -not much drainage from NGT at present (only 150cc total?)  -KUB with  perhaps some improvement  -belly appears less distended today   -continue NPO  -continue IV reglan  -await GI recs. May need to be decompressed 9.Neurogenic bladder:foley out today, voiding trial/caths  -E coli UTI--change to ctx until po advanced.  10. GERD:managed withPPIbid. 11. Hyponatremia: back to 135 today  -hctz stopped  -continue D5NS IV  12.Leftwrist fracture: NWB. [place]   LOS (Days) [place] EVALUATION WAS PERFORMED  Meredith Staggers, MD 03/25/2018 8:44 AM

## 2018-03-25 NOTE — Progress Notes (Signed)
Subjective/Chief Complaint: Feels less distended since NG was placed Plain films - look better than yesterday; final read pending Small amount of diarrhea this morning   Objective: Vital signs in last 24 hours: Temp:  [97.6 F (36.4 C)-99.7 F (37.6 C)] 99.4 F (37.4 C) (05/11 0515) Pulse Rate:  [95-97] 97 (05/11 0515) Resp:  [18-20] 18 (05/11 0515) BP: (109-119)/(77-80) 113/77 (05/11 0515) SpO2:  [95 %-97 %] 97 % (05/11 0515) Last BM Date: 03/24/18  Intake/Output from previous day: 05/10 0701 - 05/11 0700 In: 1478.8 [I.V.:1428.8; IV Piggyback:50] Out: 1180 [Urine:700; Emesis/NG output:450] Intake/Output this shift: No intake/output data recorded.  Abd - mildly distended; non-tender  Lab Results:  Recent Labs    03/24/18 1415 03/25/18 0502  WBC 15.5* 10.4  HGB 15.3* 13.8  HCT 44.5 40.9  PLT 355 290   BMET Recent Labs    03/24/18 1313 03/25/18 0502  NA 131* 135  K 3.6 3.3*  CL 98* 106  CO2 22 23  GLUCOSE 233* 147*  BUN 14 13  CREATININE 0.66 0.55  CALCIUM 9.1 8.7*   PT/INR No results for input(s): LABPROT, INR in the last 72 hours. ABG No results for input(s): PHART, HCO3 in the last 72 hours.  Invalid input(s): PCO2, PO2  Studies/Results: Dg Wrist 2 Views Left  Result Date: 03/24/2018 CLINICAL DATA:  Left wrist fracture. EXAM: LEFT WRIST - 2 VIEW COMPARISON:  None. FINDINGS: Frontal and lateral views of the left wrist were obtained with the wrist in a fiberglass cast. Screw and plate fixation of the distal radius, bridging a poorly visualized probable fracture of the proximal metaphysis. There is also a probable fracture of the ulnar styloid with distal end radial displacement of distal fragments. IMPRESSION: Hardware fixation of a distal radius fracture with anatomic position and alignment and poorly visualized ulnar styloid fracture. Electronically Signed   By: Claudie Revering M.D.   On: 03/24/2018 20:13   Dg Abd 1 View  Result Date:  03/24/2018 CLINICAL DATA:  Ileus. EXAM: ABDOMEN - 1 VIEW; DG NASO G TUBE PLC W/FL-NO RAD CONTRAST:  None. FLUOROSCOPY TIME:  Fluoroscopy Time:  6 seconds. Radiation Exposure Index (if provided by the fluoroscopic device): 2.3 mGy. Number of Acquired Spot Images: 0 COMPARISON:  Abdominal x-ray from same day at 7:44 a.m. FINDINGS: Nasogastric tube within a large hiatal hernia. Diffuse gaseous distention of small bowel and colon is unchanged. IMPRESSION: Nasogastric tube placed within a large hiatal hernia. Electronically Signed   By: Titus Dubin M.D.   On: 03/24/2018 15:40   Dg Abd 1 View  Result Date: 03/24/2018 CLINICAL DATA:  Abdominal distension and pain. EXAM: ABDOMEN - 1 VIEW COMPARISON:  03/23/2018 and prior radiograph FINDINGS: Gaseous distension small bowel and colon again identified with the cecum measuring up to 13 cm. Slightly increased small bowel distension noted. No other changes identified. IMPRESSION: Slightly increased bowel distension/ileus. Electronically Signed   By: Margarette Canada M.D.   On: 03/24/2018 12:09   Dg Addison Bailey G Tube Plc W/fl-no Rad  Result Date: 03/24/2018 CLINICAL DATA:  Ileus. EXAM: ABDOMEN - 1 VIEW; DG NASO G TUBE PLC W/FL-NO RAD CONTRAST:  None. FLUOROSCOPY TIME:  Fluoroscopy Time:  6 seconds. Radiation Exposure Index (if provided by the fluoroscopic device): 2.3 mGy. Number of Acquired Spot Images: 0 COMPARISON:  Abdominal x-ray from same day at 7:44 a.m. FINDINGS: Nasogastric tube within a large hiatal hernia. Diffuse gaseous distention of small bowel and colon is unchanged. IMPRESSION: Nasogastric tube placed  within a large hiatal hernia. Electronically Signed   By: Titus Dubin M.D.   On: 03/24/2018 15:40    Anti-infectives: Anti-infectives (From admission, onward)   Start     Dose/Rate Route Frequency Ordered Stop   03/24/18 1000  cefTRIAXone (ROCEPHIN) 1 g in sodium chloride 0.9 % 100 mL IVPB     1 g 200 mL/hr over 30 Minutes Intravenous Every 24 hours  03/24/18 0855     03/23/18 1400  cephALEXin (KEFLEX) 250 MG/5ML suspension 500 mg  Status:  Discontinued     500 mg Oral Every 8 hours 03/23/18 1134 03/24/18 0855      Assessment/Plan: Thoracic myelopathy s/p T7-T8 tumor resection by Dr. Redmond Pulling [facility] 03/13/18 Paraplegia Left wrist fracture s/p ORIF  Adynamic ileus of the small bowel and colon - Patient with persistent ileus and cecum.  Clinically stable with no signs of bowel perforation. No indications for any urgent surgical intervention. Recommend trying to keep potassium >4 and magnesium >2. limit narcotics.Continue NPO. General surgery will follow.  Replete K  ID - keflex 5/9>>5/10, rocephin 5/10>> VTE - SCDs FEN - IVF, NPO     LOS: 4 days    Maia Petties 03/25/2018

## 2018-03-25 NOTE — Progress Notes (Signed)
ANTICOAGULATION CONSULT NOTE - Initial Consult  Pharmacy Consult for heparin Indication: acute mobile DVT  No Known Allergies  Patient Measurements: Height: 5\' 4"  (162.6 cm) Weight: 224 lb 3.3 oz (101.7 kg) IBW/kg (Calculated) : 54.7 Heparin Dosing Weight: 78 kg  Vital Signs: Temp: 99.4 F (37.4 C) (05/11 0515) Temp Source: Oral (05/11 0515) BP: 113/77 (05/11 0515) Pulse Rate: 97 (05/11 0515)  Labs: Recent Labs    03/24/18 0517 03/24/18 1313 03/24/18 1415 03/25/18 0502  HGB  --   --  15.3* 13.8  HCT  --   --  44.5 40.9  PLT  --   --  355 290  CREATININE 0.62 0.66  --  0.55    Estimated Creatinine Clearance: 87.9 mL/min (by C-G formula based on SCr of 0.55 mg/dL).   Medical History: Past Medical History:  Diagnosis Date  . Chronic left hip pain   . GERD (gastroesophageal reflux disease)   . Gestational diabetes   . Hearing loss of both ears   . Hypertension   . Neuropathy    numbness/tingling from waist down  . Vision abnormalities     Medications:  Scheduled:  . dexamethasone  1 mg Intravenous BID  . heparin  3,900 Units Intravenous Once  . lisinopril  20 mg Oral Daily  . methocarbamol  500 mg Oral BID  . metoCLOPramide (REGLAN) injection  10 mg Intravenous Q6H  . vitamin B-6  200 mg Oral Daily  . tamsulosin  0.4 mg Oral QPC supper  . vitamin C  500 mg Oral BID   Infusions:  . cefTRIAXone (ROCEPHIN)  IV Stopped (03/24/18 1200)  . dextrose 5 % and 0.9 % NaCl with KCl 40 mEq/L 75 mL/hr at 03/25/18 0214  . famotidine (PEPCID) IV 20 mg (03/25/18 1041)  . heparin      Assessment: 29 YOF admitted with thoracic myelopathy s/p T7-T8 tumor resection, currently NPO due to persistent ileus and cecum. RLE dopplers today revealed acute mobile DVT of R femoral, posterior and peroneal veins, pharmacy consulted to dose heparin for treatment of VTE. Of note patient was receiving prophylactic lovenox, last dose charted 5/10 at 1817. CBC is stable, no bleeding issues  noted.  Goal of Therapy:  Heparin level 0.3-0.7 units/ml Monitor platelets by anticoagulation protocol: Yes   Plan:  D/C lovenox  Heparin bolus 3900 units followed by heparin gtt @ 1200 units/hr 6-hour heparin level Daily heparin level and CBC  Monitor s/sx of bleeding  Consider switch to therapeutic Lovenox if no further procedures planned   Charlene Brooke, PharmD PGY1 Pharmacy Resident Phone: [phone] After 3:30PM please call [place] [phone] 03/25/2018,12:09 PM

## 2018-03-25 NOTE — Progress Notes (Signed)
Occupational Therapy Session Note  Patient Details  Name: Hannah Jefferson MRN: [id number] Date of Birth: 1956/02/14  Today's Date: 03/25/2018 OT Individual Time: 4967-5916 OT Individual Time Calculation (min): 72 min    Short Term Goals: Week 1:  OT Short Term Goal 1 (Week 1): Pt will be able to sit to stand with max A of 1 to prepare for LB self care. OT Short Term Goal 2 (Week 1): Pt will be able to squat pivot to w/c with max A of 1.  OT Short Term Goal 3 (Week 1): Pt will don shirt with min A. OT Short Term Goal 4 (Week 1): Pt will use AE to don pants over feet with mod A.  Skilled Therapeutic Interventions/Progress Updates:    Treatment session with focus on ADL retraining with sit <> stand and education on AE to increase independence with bathing and dressing tasks.  Pt received supine in bed reporting sleeping well overnight.  Completed bed mobility with mod-max assist to come to sitting EOB.  Utilized Stedy +2 for transfer to [facility].  Pt required mod assist +2 for sit > stand in [place] from bed and BSC surfaces, however was able to complete sit > stand from [place] seat pads with min assist/tactile cues for weight shift of one person.  Engaged in bathing while seated on toilet.  Educated on use of long handled sponge and reacher when washing lower legs and attempting to thread pant legs.  Pt required assistance with reacher due to lack of sensation in feet and difficulty lifting legs.  Pt motivated by use of AE to continue to increase independence. Pt donned button up shirt with assist to pull shirt around back.  Pt transferred [facility] to recliner with use of Stedy and completed grooming tasks from recliner.  Pt left in recliner to increase OOB tolerance while awaiting PT session.    Therapy Documentation Precautions:  Precautions Precautions: Fall, Back Precaution Comments: LUE NWB in splint Restrictions Weight Bearing Restrictions: Yes LUE Weight Bearing: Non weight bearing General:    Vital Signs: Therapy Vitals Temp: 99.4 F (37.4 C) Temp Source: Oral Pulse Rate: 97 Resp: 18 BP: 113/77 Patient Position (if appropriate): Lying Oxygen Therapy SpO2: 97 % O2 Device: Room Air Pain:  Pt with no c/o pain  See Function Navigator for Current Functional Status.   Therapy/Group: Individual Therapy  Simonne Come 03/25/2018, 9:06 AM

## 2018-03-25 NOTE — Progress Notes (Signed)
Occupational Therapy Note  Patient Details  Name: OMAR ORREGO MRN: [id number] Date of Birth: 10/01/1956  Today's Date: 03/25/2018 OT Missed Time: 13 Minutes Missed Time Reason: MD hold (comment)  PT currently on hold from out of bed / therapies.  Missed 75 min.    Willeen Cass [facility] 03/25/2018, 2:31 PM

## 2018-03-25 NOTE — Progress Notes (Signed)
Pt now has a mobile DVT in the right common femoral vein per dopplers today. Put a call in to GI to discuss anticoagulation given possibility of a colonoscopy but never received a cal back.   Plan: 1. IV heparin which can be stopped quickly if any bleeding complications or need for procedure. 2. Pt placed on strict bedrest 3. Follow up KUB, labs in the AM 4. PICC line 6. Plan has been reviewed with nursing. 7. General surgery is following. GI did not see patient today    Meredith Staggers, MD, [facility] 03/25/2018

## 2018-03-25 NOTE — Progress Notes (Signed)
Physical Therapy Session Note  Patient Details  Name: Hannah Jefferson MRN: [id number] Date of Birth: Nov 17, 1955  Today's Date: 03/25/2018 PT Individual Time: 0915-1010 PT Individual Time Calculation (min): 55 min   Short Term Goals: Week 1:  PT Short Term Goal 1 (Week 1): Pt will verbalize 2 methods of pressure relief in w/c with min cues PT Short Term Goal 2 (Week 1): Pt will tolerate supported standing x1 minute for decreased burden of care with ADLs PT Short Term Goal 3 (Week 1): Pt will initiate w/c propulsion for UE strengthening and cardiovascular endurance PT Short Term Goal 4 (Week 1): Pt will utilize LEs and RUE to assist with bed mobility in 100% of opportunities with min cues  Skilled Therapeutic Interventions/Progress Updates:   Pt in recliner and agreeable to therapy, denies pain at rest but intermittent pain w/ stomach cramps that come and go. She reports she feels much better today than yesterday. Session focused on functional transfers and sit<>stands in stedy. Performed 6-7 stands in stedy total ranging max assist +1 to max assist +2 w/ fatigue. Verbal and tactile cues for technique. Able to tolerate very brief periods of standing 2/2 increased L ankle pain in WB. Performed 2 bed<>chair transfers w/ stedy, max assist +2. Increased time required for all mobility 2/2 occasionally making multiple attempts at standing and 2/2 intermittent stomach cramps. Ended session in supine, call bell within reach and all needs met.   Therapy Documentation Precautions:  Precautions Precautions: Fall, Back Precaution Comments: LUE NWB in splint Restrictions Weight Bearing Restrictions: Yes LUE Weight Bearing: Non weight bearing  See Function Navigator for Current Functional Status.   Therapy/Group: Individual Therapy  Kostantinos Tallman K Arnette 03/25/2018, 10:10 AM

## 2018-03-25 NOTE — Progress Notes (Signed)
Patient not in room at time when I came to see her; per surgery patient is improving; will revisit tomorrow.

## 2018-03-25 NOTE — Progress Notes (Signed)
*  Preliminary Results* Right lower extremity venous duplex completed. Right lower extremity is positive for acute mobile deep vein thrombosis involving the right common femoral vein. Additionally, there is acute deep vein thrombosis involving the right posterior tibial and peroneal veins. There is no evidence of right Baker's cyst.  Preliminary results discussed with Chrys Racer, RN, and Dr. Naaman Plummer.  03/25/2018 11:24 AM  Maudry Mayhew, BS, RVT, RDCS, RDMS

## 2018-03-25 NOTE — Progress Notes (Signed)
Attempt to place PICC in right arm.  Left restricted due to cast.  Gained access to cephalic vein without difficulty.  Unable to thread PICC centrally meeting some type of obstruction preventing PICC from dropping down into SVC.   Multiple attempts with various methods  including using a radiology wire.  Attempted by two PICC nurses without success.  Midline place for now so to be able to start heparin.  Recommend referral to Interventional Radiology to place PICC if still desired.

## 2018-03-26 ENCOUNTER — Inpatient Hospital Stay (HOSPITAL_COMMUNITY): Payer: 59

## 2018-03-26 ENCOUNTER — Inpatient Hospital Stay (HOSPITAL_COMMUNITY): Payer: 59 | Admitting: Occupational Therapy

## 2018-03-26 LAB — BASIC METABOLIC PANEL
Anion gap: 8 (ref 5–15)
BUN: 17 mg/dL (ref 6–20)
CO2: 23 mmol/L (ref 22–32)
Calcium: 8.7 mg/dL — ABNORMAL LOW (ref 8.9–10.3)
Chloride: 106 mmol/L (ref 101–111)
Creatinine, Ser: 0.56 mg/dL (ref 0.44–1.00)
GFR calc Af Amer: >60 mL/min (ref >60)
GFR calc non Af Amer: >60 mL/min (ref >60)
Glucose, Bld: 126 mg/dL — ABNORMAL HIGH (ref 65–99)
Potassium: 3.6 mmol/L (ref 3.5–5.1)
Sodium: 137 mmol/L (ref 135–145)

## 2018-03-26 LAB — HEPARIN LEVEL (UNFRACTIONATED)
Heparin Unfractionated: 1.1 IU/mL — ABNORMAL HIGH (ref 0.30–0.70)
Heparin Unfractionated: 0.85 IU/mL — ABNORMAL HIGH (ref 0.30–0.70)
Heparin Unfractionated: 0.67 IU/mL (ref 0.30–0.70)

## 2018-03-26 LAB — MAGNESIUM: Magnesium: 2.1 mg/dL (ref 1.7–2.4)

## 2018-03-26 MED ORDER — HEPARIN (PORCINE) IN NACL 100-0.45 UNIT/ML-% IJ SOLN
800.0000 [IU]/h | INTRAMUSCULAR | Status: DC
  Administered 2018-03-26 – 2018-03-27 (×2): 850 [IU]/h via INTRAVENOUS
  Administered 2018-03-29 – 2018-04-03 (×9): 1050 [IU]/h via INTRAVENOUS
  Administered 2018-04-04: 900 [IU]/h via INTRAVENOUS
  Filled 2018-03-26 (×10): qty 250

## 2018-03-26 MED ORDER — POTASSIUM CHLORIDE 10 MEQ/100ML IV SOLN
10.0000 meq | INTRAVENOUS | Status: AC
  Administered 2018-03-26 (×3): 10 meq via INTRAVENOUS
  Filled 2018-03-26 (×3): qty 100

## 2018-03-26 MED ORDER — POTASSIUM CHLORIDE 10 MEQ/100ML IV SOLN
10.0000 meq | INTRAVENOUS | Status: DC
  Filled 2018-03-26 (×4): qty 100

## 2018-03-26 NOTE — Progress Notes (Signed)
ANTICOAGULATION CONSULT NOTE - Initial Consult  Pharmacy Consult for heparin Indication: acute mobile DVT  No Known Allergies  Patient Measurements: Height: 5\' 4"  (162.6 cm) Weight: 224 lb 3.3 oz (101.7 kg) IBW/kg (Calculated) : 54.7 Heparin Dosing Weight: 78 kg  Vital Signs: Temp: 97.7 F (36.5 C) (05/12 1502) Temp Source: Oral (05/12 1502) BP: 132/78 (05/12 1502) Pulse Rate: 79 (05/12 1502)  Labs: Recent Labs    03/24/18 1313 03/24/18 1415 03/25/18 0502 03/26/18 0236 03/26/18 1147 03/26/18 2010  HGB  --  15.3* 13.8  --   --   --   HCT  --  44.5 40.9  --   --   --   PLT  --  355 290  --   --   --   HEPARINUNFRC  --   --   --  1.10* 0.85* 0.67  CREATININE 0.66  --  0.55 0.56  --   --     Estimated Creatinine Clearance: 87.9 mL/min (by C-G formula based on SCr of 0.56 mg/dL).   Medical History: Past Medical History:  Diagnosis Date  . Chronic left hip pain   . GERD (gastroesophageal reflux disease)   . Gestational diabetes   . Hearing loss of both ears   . Hypertension   . Neuropathy    numbness/tingling from waist down  . Vision abnormalities     Medications:  Scheduled:  . lisinopril  20 mg Oral Daily  . methocarbamol  500 mg Oral BID  . metoCLOPramide (REGLAN) injection  10 mg Intravenous Q6H  . vitamin B-6  200 mg Oral Daily  . sodium chloride flush  10-40 mL Intracatheter Q12H  . tamsulosin  0.4 mg Oral QPC supper  . vitamin C  500 mg Oral BID   Infusions:  . cefTRIAXone (ROCEPHIN)  IV Stopped (03/26/18 1524)  . dextrose 5 % and 0.9 % NaCl with KCl 40 mEq/L 75 mL/hr at 03/26/18 1930  . famotidine (PEPCID) IV Stopped (03/26/18 5909)  . heparin 850 Units/hr (03/26/18 1823)    Assessment: 59 YOF admitted with thoracic myelopathy s/p T7-T8 tumor resection, currently NPO due to persistent ileus and cecum, found with acute DVT now on IV heparin in case of eventual GI procedure. Of note patient was receiving prophylactic lovenox, last dose charted  5/10 at 1817.   Heparin level now therapeutic at 0.67 after rate reduction. CBC is stable, no bleeding issues documented.   Goal of Therapy:  Heparin level 0.3-0.7 units/ml Monitor platelets by anticoagulation protocol: Yes   Plan:  Continue heparin at 850 units/hr Check 6-hour heparin level to confirm Daily heparin level and CBC  Monitor s/sx of bleeding  Elicia Lamp, PharmD, BCPS Clinical Pharmacist 03/26/2018 9:37 PM

## 2018-03-26 NOTE — Progress Notes (Signed)
Subjective: Feeling a little better. Some flatus.  Objective: Vital signs in last 24 hours: Temp:  [97.6 F (36.4 C)-98.5 F (36.9 C)] 97.7 F (36.5 C) (05/12 0422) Pulse Rate:  [78-90] 78 (05/12 0422) Resp:  [16-20] 20 (05/12 0422) BP: (112-130)/(77-93) 130/91 (05/12 0422) SpO2:  [97 %-100 %] 98 % (05/12 0422) Weight change:  Last BM Date: 03/25/18  PE: GEN:  NAD, NGT in place ABD:  Distended and protuberant/tympanic; non-tender; no peritonitis  Lab Results: CBC    Component Value Date/Time   WBC 10.4 03/25/2018 0502   RBC 4.68 03/25/2018 0502   HGB 13.8 03/25/2018 0502   HCT 40.9 03/25/2018 0502   PLT 290 03/25/2018 0502   MCV 87.4 03/25/2018 0502   MCH 29.5 03/25/2018 0502   MCHC 33.7 03/25/2018 0502   RDW 14.6 03/25/2018 0502   LYMPHSABS 1.3 03/22/2018 0549   MONOABS 0.4 03/22/2018 0549   EOSABS 0.0 03/22/2018 0549   BASOSABS 0.0 03/22/2018 0549   CMP     Component Value Date/Time   NA 137 03/26/2018 0236   K 3.6 03/26/2018 0236   CL 106 03/26/2018 0236   CO2 23 03/26/2018 0236   GLUCOSE 126 (H) 03/26/2018 0236   BUN 17 03/26/2018 0236   CREATININE 0.56 03/26/2018 0236   CALCIUM 8.7 (L) 03/26/2018 0236   PROT 4.6 (L) 03/22/2018 0549   ALBUMIN 2.6 (L) 03/22/2018 0549   AST 29 03/22/2018 0549   ALT 52 03/22/2018 0549   ALKPHOS 45 03/22/2018 0549   BILITOT 0.9 03/22/2018 0549   GFRNONAA >60 03/26/2018 0236   GFRAA >60 03/26/2018 0236   Studies/Results: Xray:  Improvement of colonic distention; slight interval worsening of small bowel distention  Assessment:  1.  Ileus, small bowel and colon, post-operative spinal surgery. 2.  New DVT.  Plan:  1.  Continue NGT. 2.  Follow daily abdominal xrays; today's xray was improved; if worsening in colonic distention, would consider rectal foley as next step in management. 3.  On heparin for DVT; no obvious contraindication for this, and could be stopped with short half-life should endoscopic decompression  be ultimately necessary. 4.  Continue to monitor and treat any hypokalemia and hypomagnesemia. 5.  Minimize narcotics as possible. 6.  Mobility is now limited due to new diagnosis of DVT, unfortunately this may prolong recovery from her ileus. 7.  [facility] will follow-up tomorrow.   As a point of clarification:  I went to see patient yesterday and she was out of the room; this was documented in an Epic note.  I received a message from [facility] answering service yesterday 11:45 am with a message to call Dr. Naaman Plummer; phone records can confirm that I called him back at 11:47 am; there was no answer after letting the phone ring multiple times; ultimately, there was a message tone, and I then left a message.  I was never called back, either directly by Dr. Naaman Plummer or by the answering service, after this phone call.   Landry Dyke 03/26/2018, 11:22 AM

## 2018-03-26 NOTE — Progress Notes (Signed)
  Progress Note: General Surgery Service   Assessment/Plan: Patient Active Problem List   Diagnosis Date Noted  . Thoracic myelopathy 03/21/2018  . Neurogenic bowel 03/21/2018  . Neurogenic bladder 03/21/2018  . Left wrist fracture, sequela 03/21/2018  . Numbness 02/15/2018  . Ataxic gait 02/15/2018  . Left leg weakness 02/15/2018  . Other fatigue 02/15/2018  . Urinary incontinence 02/15/2018  . Bowel incontinence 02/15/2018   Ileus at rehabilitation from back surgery -having flatus -XR with profound dilation, cecum less dilated than yesterday -continue NG tube, continue bowel rest -would encourage ambulation but will defer to primary    LOS: 5 days  Chief Complaint/Subjective: Pain improved, no nausea, some flatus overnight  Objective: Vital signs in last 24 hours: Temp:  [97.6 F (36.4 C)-98.5 F (36.9 C)] 97.7 F (36.5 C) (05/12 0422) Pulse Rate:  [78-90] 78 (05/12 0422) Resp:  [16-20] 20 (05/12 0422) BP: (112-130)/(77-93) 130/91 (05/12 0422) SpO2:  [97 %-100 %] 98 % (05/12 0422) Last BM Date: 03/25/18  Intake/Output from previous day: 05/11 0701 - 05/12 0700 In: 904.1 [I.V.:604.1; IV Piggyback:300] Out: 750 [Urine:475; Emesis/NG output:275] Intake/Output this shift: No intake/output data recorded.  Lungs: CTAb  Cardiovascular: RRR  Abd: soft, NT, moderate distension  Neuro: AOx4  Lab Results: CBC  Recent Labs    03/24/18 1415 03/25/18 0502  WBC 15.5* 10.4  HGB 15.3* 13.8  HCT 44.5 40.9  PLT 355 290   BMET Recent Labs    03/25/18 0502 03/26/18 0236  NA 135 137  K 3.3* 3.6  CL 106 106  CO2 23 23  GLUCOSE 147* 126*  BUN 13 17  CREATININE 0.55 0.56  CALCIUM 8.7* 8.7*   PT/INR No results for input(s): LABPROT, INR in the last 72 hours. ABG No results for input(s): PHART, HCO3 in the last 72 hours.  Invalid input(s): PCO2, PO2  Studies/Results:  Anti-infectives: Anti-infectives (From admission, onward)   Start     Dose/Rate Route  Frequency Ordered Stop   03/24/18 1000  cefTRIAXone (ROCEPHIN) 1 g in sodium chloride 0.9 % 100 mL IVPB     1 g 200 mL/hr over 30 Minutes Intravenous Every 24 hours 03/24/18 0855     03/23/18 1400  cephALEXin (KEFLEX) 250 MG/5ML suspension 500 mg  Status:  Discontinued     500 mg Oral Every 8 hours 03/23/18 1134 03/24/18 0855      Medications: Scheduled Meds: . lisinopril  20 mg Oral Daily  . methocarbamol  500 mg Oral BID  . metoCLOPramide (REGLAN) injection  10 mg Intravenous Q6H  . vitamin B-6  200 mg Oral Daily  . sodium chloride flush  10-40 mL Intracatheter Q12H  . tamsulosin  0.4 mg Oral QPC supper  . vitamin C  500 mg Oral BID   Continuous Infusions: . cefTRIAXone (ROCEPHIN)  IV Stopped (03/25/18 2130)  . dextrose 5 % and 0.9 % NaCl with KCl 40 mEq/L 75 mL/hr at 03/25/18 0214  . famotidine (PEPCID) IV Stopped (03/26/18 3734)  . heparin 1,000 Units/hr (03/26/18 0416)   PRN Meds:.acetaminophen, alum & mag hydroxide-simeth, bisacodyl, diphenhydrAMINE, guaiFENesin-dextromethorphan, lidocaine, polyethylene glycol, prochlorperazine **OR** prochlorperazine **OR** prochlorperazine, sodium chloride flush, traZODone  Mickeal Skinner, MD Pg# [phone] [facility]

## 2018-03-26 NOTE — Progress Notes (Addendum)
[facility]     PROGRESS NOTE    Subjective/Complaints: Feeling somewhat better today.  More results with suction after NG tube was moved.  Would like something to drink  ROS: Patient denies fever, rash, sore throat, blurred vision, nausea, vomiting, diarrhea, cough, shortness of breath or chest pain, joint or back pain, headache, or mood change.   Objective: Vital Signs: Blood pressure (!) 130/91, pulse 78, temperature 97.7 F (36.5 C), temperature source Oral, resp. rate 20, height 5\' 4"  (1.626 m), weight 101.7 kg (224 lb 3.3 oz), SpO2 98 %. Dg Wrist 2 Views Left  Result Date: 03/24/2018 CLINICAL DATA:  Left wrist fracture. EXAM: LEFT WRIST - 2 VIEW COMPARISON:  None. FINDINGS: Frontal and lateral views of the left wrist were obtained with the wrist in a fiberglass cast. Screw and plate fixation of the distal radius, bridging a poorly visualized probable fracture of the proximal metaphysis. There is also a probable fracture of the ulnar styloid with distal end radial displacement of distal fragments. IMPRESSION: Hardware fixation of a distal radius fracture with anatomic position and alignment and poorly visualized ulnar styloid fracture. Electronically Signed   By: Claudie Revering M.D.   On: 03/24/2018 20:13   Dg Abd 1 View  Result Date: 03/24/2018 CLINICAL DATA:  Ileus. EXAM: ABDOMEN - 1 VIEW; DG NASO G TUBE PLC W/FL-NO RAD CONTRAST:  None. FLUOROSCOPY TIME:  Fluoroscopy Time:  6 seconds. Radiation Exposure Index (if provided by the fluoroscopic device): 2.3 mGy. Number of Acquired Spot Images: 0 COMPARISON:  Abdominal x-ray from same day at 7:44 a.m. FINDINGS: Nasogastric tube within a large hiatal hernia. Diffuse gaseous distention of small bowel and colon is unchanged. IMPRESSION: Nasogastric tube placed within a large hiatal hernia. Electronically Signed   By: Titus Dubin M.D.   On: 03/24/2018 15:40   Dg Abd Portable 1v  Result Date:  03/25/2018 CLINICAL DATA:  Ileus EXAM: PORTABLE ABDOMEN - 1 VIEW COMPARISON:  Portable exam 0643 hours compared to 03/24/2018 FINDINGS: Gaseous distention of colon and small bowel loops. Cecum remains distended, up to 14.8 cm diameter, unchanged. Degree of bowel distention appears grossly similar to prior exam. No definite bowel wall thickening. Lung bases clear. Tip of nasogastric tube at distal esophagus, recommend advancing tube 12 cm. Lung bases clear. No gastric distention identified. IMPRESSION: Persistent gaseous distension of large and small bowel loops similar to previous exam question ileus. Recommend advancing nasogastric tube 12 cm into stomach. Electronically Signed   By: Lavonia Dana M.D.   On: 03/25/2018 09:20   Dg Addison Bailey G Tube Plc W/fl-no Rad  Result Date: 03/24/2018 CLINICAL DATA:  Ileus. EXAM: ABDOMEN - 1 VIEW; DG NASO G TUBE PLC W/FL-NO RAD CONTRAST:  None. FLUOROSCOPY TIME:  Fluoroscopy Time:  6 seconds. Radiation Exposure Index (if provided by the fluoroscopic device): 2.3 mGy. Number of Acquired Spot Images: 0 COMPARISON:  Abdominal x-ray from same day at 7:44 a.m. FINDINGS: Nasogastric tube within a large hiatal hernia. Diffuse gaseous distention of small bowel and colon is unchanged. IMPRESSION: Nasogastric tube placed within a large hiatal hernia. Electronically Signed   By: Titus Dubin M.D.   On: 03/24/2018 15:40   [place] Ekg Site Rite  Result Date: 03/25/2018 If Site Rite image not attached, placement could not be confirmed due to current cardiac rhythm.  Recent Labs    03/24/18 1415 03/25/18 0502  WBC 15.5* 10.4  HGB 15.3* 13.8  HCT 44.5 40.9  PLT 355  290   Recent Labs    03/25/18 0502 03/26/18 0236  NA 135 137  K 3.3* 3.6  CL 106 106  GLUCOSE 147* 126*  BUN 13 17  CREATININE 0.55 0.56  CALCIUM 8.7* 8.7*   CBG (last 3)  No results for input(s): GLUCAP in the last 72 hours.  Wt Readings from Last 3 Encounters:  03/21/18 101.7 kg (224 lb 3.3 oz)  03/20/18  93.4 kg (206 lb)  02/15/18 94.1 kg (207 lb 8 oz)    Physical Exam:  Constitutional: No distress . Vital signs reviewed. HEENT: EOMI, oral membranes moist. NGT to suction. Container half full, brown colored fluids Neck: supple Cardiovascular: RRR without murmur. No JVD    Respiratory: CTA Bilaterally without wheezes or rales. Normal effort    GI: a few scattered high pitched bowel sounds. Less tense, non-tender  Musculoskeletal: She exhibits noedema. Left arm in [facility]. Full movement of fingers Neurological: Motor RUE 5/5. LUE limited by splint can lift arm and wiggle all fingers. Decreased sensation to LT and proprioception, left greater than right brelow T8 dermatome. LE motor 2+ to 3/5 HF, KE and 3/5---motor and sensory exam stable.  ADF/PF. DTR's 1+. No resting tone.neuro exam stable Skin: Back incision CDI.   Psychiatric:  Pleasant and cooperative   Assessment/Plan: 1. Paraplegia and functional deficits secondary to T8 myelopathy, left wrist fracture which require 3+ hours per day of interdisciplinary therapy in a comprehensive inpatient rehab setting. Physiatrist is providing close team supervision and 24 hour management of active medical problems listed below. Physiatrist and rehab team continue to assess barriers to discharge/monitor patient progress toward functional and medical goals.  Function:  Bathing Bathing position Bathing activity did not occur: N/A Position: (sitting on BSC)  Bathing parts Body parts bathed by patient: Right arm, Left arm, Chest, Abdomen, Right upper leg, Left upper leg Body parts bathed by helper: Front perineal area, Buttocks, Back  Bathing assist Assist Level: Assistive device, 2 helpers Assistive Device Comment: Geologist, engineering Dressing/Undressing Upper body dressing   What is the patient wearing?: Button up shirt Bra - Perfomed by patient: Thread/unthread left bra strap, Thread/unthread right bra strap Bra - Perfomed by helper:  Hook/unhook bra (pull down sports bra) Pull over shirt/dress - Perfomed by patient: Thread/unthread right sleeve, Thread/unthread left sleeve, Put head through opening Pull over shirt/dress - Perfomed by helper: Put head through opening, Pull shirt over trunk Button up shirt - Perfomed by patient: Thread/unthread right sleeve, Thread/unthread left sleeve, Button/unbutton shirt Button up shirt - Perfomed by helper: Pull shirt around back    Upper body assist Assist Level: Touching or steadying assistance(Pt > 75%)      Lower Body Dressing/Undressing Lower body dressing Lower body dressing/undressing activity did not occur: N/A What is the patient wearing?: Pants, Non-skid slipper socks       Pants- Performed by helper: Thread/unthread right pants leg, Thread/unthread left pants leg, Pull pants up/down   Non-skid slipper socks- Performed by helper: Don/doff right sock, Don/doff left sock                  Lower body assist Assist for lower body dressing: 2 Helpers      Toileting Toileting     Toileting steps completed by helper: Performs perineal hygiene(wears brief)    Toileting assist Assist level: Touching or steadying assistance (Pt.75%)   Transfers Chair/bed transfer   Chair/bed transfer method: Other Chair/bed transfer assist level: 2 helpers Chair/bed transfer  assistive device: Mechanical lift Mechanical lift: Stedy   Locomotion Ambulation Ambulation activity did not occur: Safety/medical concerns         Wheelchair   Type: Manual Max wheelchair distance: 150 Assist Level: Touching or steadying assistance (Pt > 75%)  Cognition Comprehension Comprehension assist level: Follows complex conversation/direction with no assist  Expression Expression assist level: Expresses complex ideas: With no assist  Social Interaction Social Interaction assist level: Interacts appropriately with others - No medications needed.  Problem Solving Problem solving assist level:  Solves complex 90% of the time/cues < 10% of the time  Memory Memory assist level: Complete Independence: No helper   Medical Problem List and Plan: 1.Paraplegia and funtional deficitssecondary to T8 meningioma with myeopathy -recent left wrist fracture -continue PT, OT 2. DVT  Right common femoral vein (mobile), right posterior tibial and peroneal vein thrombi:  -IV heparin  -pt continues on strict bedrest 3. Pain Management:Hydrocodone prn 4. Mood:LCSW to follow for evaluation and support. 5. Neuropsych: This patientiscapable of making decisions on herown behalf. 6. Skin/Wound Care:Routine pressure relief measures. 7. Fluids/Electrolytes/Nutrition:Monitor I/O.    -potassium 3.6 today  -continue k+ in IVF whether this is they are pink  -repeat runs of vitamin k x 3 today 8. Post op ileus/neurogenic bowel:  -feels better.  -275 cc outpt over night, stool/fluids  -KUB showing gradual improvement  -small pieces of stool noted by RN per rectum----will repeat SSE today to see if we can further aid decompression from below   -continue NPO  -continue IV reglan  -GI has not seen the patient this weekend.   -surgery following  -IR unable to place PICC today. Will use midline for now 9.Neurogenic bladder:foley out today, voiding trial/caths  -E coli UTI--continue rocephin q24 10. GERD:managed withPPIbid. 11. Hyponatremia: 137 today  -hctz stopped  -continue D5NS IV  12.Leftwrist fracture: NWB. [place]   LOS (Days) [place] EVALUATION WAS PERFORMED  Meredith Staggers, MD 03/26/2018 8:09 AM

## 2018-03-26 NOTE — Progress Notes (Signed)
   Patient Status: [facility] - In-pt  Assessment and Plan: Patient in need of venous access.   Peripherally inserted central catheter placement   ______________________________________________________________________   History of Present Illness: Hannah Jefferson is a 62 y.o. female    Post op spinal surgery Now with new mobile peroneal vein dvt Being treated with Heparin drip Need for additional lumens for Potassium; fluids; meds; heparin IV team unsuccessful with bedside PICC Allergies and medications reviewed.   Review of Systems: A 12 point ROS discussed and pertinent positives are indicated in the HPI above.  All other systems are negative.   Vital Signs: BP (!) 130/91 (BP Location: Right Arm)   Pulse 78   Temp 97.7 F (36.5 C) (Oral)   Resp 20   Ht 5\' 4"  (1.626 m)   Wt 224 lb 3.3 oz (101.7 kg)   SpO2 98%   BMI 38.49 kg/m   Physical Exam  Constitutional: She is oriented to person, place, and time.  Pulmonary/Chest: Effort normal. She has wheezes.  Abdominal: She exhibits distension.  Neurological: She is alert and oriented to person, place, and time.  Skin: Skin is warm and dry.  Psychiatric: She has a normal mood and affect. Her behavior is normal. Judgment and thought content normal.  Vitals reviewed.    Imaging reviewed.   Labs:  COAGS: No results for input(s): INR, APTT in the last 8760 hours.  BMP: Recent Labs    03/24/18 0517 03/24/18 1313 03/25/18 0502 03/26/18 0236  NA 129* 131* 135 137  K 4.0 3.6 3.3* 3.6  CL 98* 98* 106 106  CO2 21* 22 23 23   GLUCOSE 111* 233* 147* 126*  BUN 12 14 13 17   CALCIUM 9.0 9.1 8.7* 8.7*  CREATININE 0.62 0.66 0.55 0.56  GFRNONAA >60 >60 >60 >60  GFRAA >60 >60 >60 >60       Electronically Signed: Nandi Tonnesen A, PA-C 03/26/2018, 11:49 AM   I spent a total of 15 minutes in face to face in clinical consultation, greater than 50% of which was counseling/coordinating care for venous access. Patient  ID: Hannah Jefferson, female   DOB: February 13, 1956, 62 y.o.   MRN: [id number]

## 2018-03-26 NOTE — Progress Notes (Signed)
Occupational Therapy Session Note  Patient Details  Name: Hannah Jefferson MRN: [id number] Date of Birth: [birth year]  Today's Date: 03/26/2018 OT Missed Time: 67 Minutes Missed Time Reason: MD hold (comment)(mobile DVT)  Short Term Goals: Week 1:  OT Short Term Goal 1 (Week 1): Pt will be able to sit to stand with max A of 1 to prepare for LB self care. OT Short Term Goal 2 (Week 1): Pt will be able to squat pivot to w/c with max A of 1.  OT Short Term Goal 3 (Week 1): Pt will don shirt with min A. OT Short Term Goal 4 (Week 1): Pt will use AE to don pants over feet with mod A.  Skilled Therapeutic Interventions/Progress Updates:    Per RN staff, pt not to be seen for therapies due to mobile DVT. Will make up therapy time once pt is cleared by medical team to participate.   Therapy Documentation Precautions:  Precautions Precautions: Fall, Back Precaution Comments: LUE NWB in splint Restrictions Weight Bearing Restrictions: Yes LUE Weight Bearing: Non weight bearing General: General OT Amount of Missed Time: 120 Minutes ADL: ADL ADL Comments: refer to functional navigator     See Function Navigator for Current Functional Status.   Therapy/Group: Individual Therapy  Vasilios Ottaway A Sena Hoopingarner 03/26/2018, 12:32 PM

## 2018-03-26 NOTE — Progress Notes (Signed)
Resting in bed with prafo boots on, reports feeling much better but also reports increased frequency in stomach cramping. No results produced with soap suds enema, encouraged pt to use incentive spirometer and lay on left side, alternating between left side and supine q2.

## 2018-03-26 NOTE — Progress Notes (Signed)
ANTICOAGULATION CONSULT NOTE - Initial Consult  Pharmacy Consult for heparin Indication: acute mobile DVT  No Known Allergies  Patient Measurements: Height: 5\' 4"  (162.6 cm) Weight: 224 lb 3.3 oz (101.7 kg) IBW/kg (Calculated) : 54.7 Heparin Dosing Weight: 78 kg  Vital Signs: Temp: 97.7 F (36.5 C) (05/12 0422) Temp Source: Oral (05/12 0422) BP: 130/91 (05/12 0422) Pulse Rate: 78 (05/12 0422)  Labs: Recent Labs    03/24/18 1313 03/24/18 1415 03/25/18 0502 03/26/18 0236 03/26/18 1147  HGB  --  15.3* 13.8  --   --   HCT  --  44.5 40.9  --   --   PLT  --  355 290  --   --   HEPARINUNFRC  --   --   --  1.10* 0.85*  CREATININE 0.66  --  0.55 0.56  --     Estimated Creatinine Clearance: 87.9 mL/min (by C-G formula based on SCr of 0.56 mg/dL).   Medical History: Past Medical History:  Diagnosis Date  . Chronic left hip pain   . GERD (gastroesophageal reflux disease)   . Gestational diabetes   . Hearing loss of both ears   . Hypertension   . Neuropathy    numbness/tingling from waist down  . Vision abnormalities     Medications:  Scheduled:  . lisinopril  20 mg Oral Daily  . methocarbamol  500 mg Oral BID  . metoCLOPramide (REGLAN) injection  10 mg Intravenous Q6H  . vitamin B-6  200 mg Oral Daily  . sodium chloride flush  10-40 mL Intracatheter Q12H  . tamsulosin  0.4 mg Oral QPC supper  . vitamin C  500 mg Oral BID   Infusions:  . cefTRIAXone (ROCEPHIN)  IV Stopped (03/25/18 2130)  . dextrose 5 % and 0.9 % NaCl with KCl 40 mEq/L 75 mL/hr at 03/25/18 0214  . famotidine (PEPCID) IV Stopped (03/26/18 9675)  . heparin 1,000 Units/hr (03/26/18 0416)    Assessment: 41 YOF admitted with thoracic myelopathy s/p T7-T8 tumor resection, currently NPO due to persistent ileus and cecum, found with acute DVT now on IV heparin in case of eventual GI procedure. Of note patient was receiving prophylactic lovenox, last dose charted 5/10 at 1817.   Heparin level is still  above goal at 0.85 after holding gtt x 30 min and reducing rate overnight. CBC is stable, no bleeding issues noted.   Goal of Therapy:  Heparin level 0.3-0.7 units/ml Monitor platelets by anticoagulation protocol: Yes   Plan:  Decrease heparin to 850 units/hr Check 6-hour heparin level Daily heparin level and CBC  Monitor s/sx of bleeding   Charlene Brooke, PharmD PGY1 Pharmacy Resident Phone: [phone] After 3:30PM please call [place] [phone] 03/26/2018,1:02 PM

## 2018-03-26 NOTE — Progress Notes (Signed)
ANTICOAGULATION CONSULT NOTE   Pharmacy Consult for Heparin Indication: DVT  No Known Allergies  Patient Measurements: Height: 5\' 4"  (162.6 cm) Weight: 224 lb 3.3 oz (101.7 kg) IBW/kg (Calculated) : 54.7 Heparin Dosing Weight: 78 kg   Labs: Recent Labs    03/24/18 1313 03/24/18 1415 03/25/18 0502 03/26/18 0236  HGB  --  15.3* 13.8  --   HCT  --  44.5 40.9  --   PLT  --  355 290  --   HEPARINUNFRC  --   --   --  1.10*  CREATININE 0.66  --  0.55 0.56    Estimated Creatinine Clearance: 87.9 mL/min (by C-G formula based on SCr of 0.56 mg/dL).   Medical History: Past Medical History:  Diagnosis Date  . Chronic left hip pain   . GERD (gastroesophageal reflux disease)   . Gestational diabetes   . Hearing loss of both ears   . Hypertension   . Neuropathy    numbness/tingling from waist down  . Vision abnormalities     Medications:  Scheduled:  . lisinopril  20 mg Oral Daily  . methocarbamol  500 mg Oral BID  . metoCLOPramide (REGLAN) injection  10 mg Intravenous Q6H  . vitamin B-6  200 mg Oral Daily  . sodium chloride flush  10-40 mL Intracatheter Q12H  . tamsulosin  0.4 mg Oral QPC supper  . vitamin C  500 mg Oral BID   Infusions:  . cefTRIAXone (ROCEPHIN)  IV Stopped (03/25/18 2130)  . dextrose 5 % and 0.9 % NaCl with KCl 40 mEq/L 75 mL/hr at 03/25/18 0214  . famotidine (PEPCID) IV Stopped (03/25/18 2208)  . heparin      Assessment: 71 YOF admitted with thoracic myelopathy s/p T7-T8 tumor resection, currently NPO due to persistent ileus and cecum. RLE dopplers today revealed acute mobile DVT of R femoral, posterior and peroneal veins, pharmacy consulted to dose heparin for treatment of VTE. Of note patient was receiving prophylactic lovenox, last dose charted 5/10 at 1817. CBC is stable, no bleeding issues noted.  5/12 AM update: initial heparin level is elevated, heparin running through midline and drawn peripherally, no issues per RN.   Goal of Therapy:   Heparin level 0.3-0.7 units/ml Monitor platelets by anticoagulation protocol: Yes   Plan:  Hold heparin x 30 mins Re-start heparin at 1000 units/hr at 0415 1200 HL Monitor for bleeding  Narda Bonds, PharmD, BCPS Clinical Pharmacist Phone: [phone]

## 2018-03-27 ENCOUNTER — Inpatient Hospital Stay (HOSPITAL_COMMUNITY): Payer: 59 | Admitting: Occupational Therapy

## 2018-03-27 ENCOUNTER — Inpatient Hospital Stay (HOSPITAL_COMMUNITY): Payer: 59 | Admitting: Physical Therapy

## 2018-03-27 ENCOUNTER — Inpatient Hospital Stay (HOSPITAL_COMMUNITY): Payer: 59

## 2018-03-27 LAB — BASIC METABOLIC PANEL
Anion gap: 6 (ref 5–15)
Anion gap: 8 (ref 5–15)
BUN: 16 mg/dL (ref 6–20)
BUN: 15 mg/dL (ref 6–20)
CO2: 24 mmol/L (ref 22–32)
CO2: 22 mmol/L (ref 22–32)
Calcium: 8.7 mg/dL — ABNORMAL LOW (ref 8.9–10.3)
Calcium: 9.1 mg/dL (ref 8.9–10.3)
Chloride: 107 mmol/L (ref 101–111)
Chloride: 106 mmol/L (ref 101–111)
Creatinine, Ser: 0.54 mg/dL (ref 0.44–1.00)
Creatinine, Ser: 0.59 mg/dL (ref 0.44–1.00)
GFR calc Af Amer: >60 mL/min (ref >60)
GFR calc Af Amer: >60 mL/min (ref >60)
GFR calc non Af Amer: >60 mL/min (ref >60)
GFR calc non Af Amer: >60 mL/min (ref >60)
Glucose, Bld: 112 mg/dL — ABNORMAL HIGH (ref 65–99)
Glucose, Bld: 91 mg/dL (ref 65–99)
Potassium: 3.7 mmol/L (ref 3.5–5.1)
Potassium: 3.7 mmol/L (ref 3.5–5.1)
Sodium: 137 mmol/L (ref 135–145)
Sodium: 136 mmol/L (ref 135–145)

## 2018-03-27 LAB — CBC
HCT: 39.2 % (ref 36.0–46.0)
Hemoglobin: 13.1 g/dL (ref 12.0–15.0)
MCH: 29.4 pg (ref 26.0–34.0)
MCHC: 33.4 g/dL (ref 30.0–36.0)
MCV: 88.1 fL (ref 78.0–100.0)
Platelets: 227 10*3/uL (ref 150–400)
RBC: 4.45 MIL/uL (ref 3.87–5.11)
RDW: 14.4 % (ref 11.5–15.5)
WBC: 7 10*3/uL (ref 4.0–10.5)

## 2018-03-27 LAB — HEPARIN LEVEL (UNFRACTIONATED): Heparin Unfractionated: 0.5 IU/mL (ref 0.30–0.70)

## 2018-03-27 MED ORDER — POTASSIUM CHLORIDE 10 MEQ/100ML IV SOLN
10.0000 meq | INTRAVENOUS | Status: AC
  Administered 2018-03-27 (×3): 10 meq via INTRAVENOUS
  Filled 2018-03-27 (×3): qty 100

## 2018-03-27 MED ORDER — GENERIC EXTERNAL MEDICATION
Status: DC
Start: ? — End: 2018-03-27

## 2018-03-27 MED ORDER — IOPAMIDOL (ISOVUE-300) INJECTION 61%
INTRAVENOUS | Status: AC
  Administered 2018-03-27: 15:00:00
  Filled 2018-03-27: qty 30

## 2018-03-27 NOTE — Progress Notes (Signed)
   Subjective/Chief Complaint: Unsure about flatus, had some bm in sleep last night, crampy abd pain   Objective: Vital signs in last 24 hours: Temp:  [97.7 F (36.5 C)-98.2 F (36.8 C)] 98.2 F (36.8 C) (05/13 0529) Pulse Rate:  [76-84] 84 (05/13 0529) Resp:  [16-17] 17 (05/13 0529) BP: (131-132)/(75-82) 131/82 (05/13 0529) SpO2:  [96 %-99 %] 96 % (05/13 0529) Last BM Date: 03/25/18  Intake/Output from previous day: 05/12 0701 - 05/13 0700 In: -  Out: 350 [Emesis/NG output:350] Intake/Output this shift: Total I/O In: -  Out: 100 [Emesis/NG output:100]  GI: few bs, nontender, distended  Lab Results:  Recent Labs    03/25/18 0502 03/27/18 0233  WBC 10.4 7.0  HGB 13.8 13.1  HCT 40.9 39.2  PLT 290 227   BMET Recent Labs    03/26/18 0236 03/27/18 0233  NA 137 137  K 3.6 3.7  CL 106 107  CO2 23 24  GLUCOSE 126* 112*  BUN 17 16  CREATININE 0.56 0.54  CALCIUM 8.7* 8.7*   PT/INR No results for input(s): LABPROT, INR in the last 72 hours. ABG No results for input(s): PHART, HCO3 in the last 72 hours.  Invalid input(s): PCO2, PO2  Studies/Results: Dg Abd 1 View  Result Date: 03/27/2018 CLINICAL DATA:  Ileus EXAM: ABDOMEN - 1 VIEW COMPARISON:  03/26/2018 FINDINGS: NG in the proximal stomach near the GE junction. Recommend advancing into the stomach. Surgical clips in the gallbladder fossa Progressive dilatation of large and small bowel. Lumbar scoliosis.  No renal calculi. IMPRESSION: Interval progression of large and small bowel dilatation most likely ileus versus distal small bowel obstruction. NG tube in the proximal stomach, recommend advancing NG tube. Electronically Signed   By: Franchot Gallo M.D.   On: 03/27/2018 07:17   Dg Abd 1 View  Result Date: 03/26/2018 CLINICAL DATA:  Ileus EXAM: ABDOMEN - 1 VIEW COMPARISON:  03/25/2018 FINDINGS: Tip of nasogastric tube projects over proximal stomach. Persisting gaseous distention of large and small bowel loops  throughout abdomen. No bowel wall thickening or free air. Decreased cecal distention and increased small bowel distention since previous exam. Lung bases clear. Bones demineralized with levoconvex thoracolumbar scoliosis. IMPRESSION: Persistent gaseous distention of large and small bowel loops in abdomen consistent with ileus, slightly increased. Electronically Signed   By: Lavonia Dana M.D.   On: 03/26/2018 08:21   [place] Ekg Site Rite  Result Date: 03/25/2018 If Site Rite image not attached, placement could not be confirmed due to current cardiac rhythm.   Anti-infectives: Anti-infectives (From admission, onward)   Start     Dose/Rate Route Frequency Ordered Stop   03/24/18 1000  cefTRIAXone (ROCEPHIN) 1 g in sodium chloride 0.9 % 100 mL IVPB     1 g 200 mL/hr over 30 Minutes Intravenous Every 24 hours 03/24/18 0855     03/23/18 1400  cephALEXin (KEFLEX) 250 MG/5ML suspension 500 mg  Status:  Discontinued     500 mg Oral Every 8 hours 03/23/18 1134 03/24/18 0855      Assessment/Plan: Ileus  -xray no better today, neither is exam -probably reasonable to get ct a/p if primary team in agreement  -GI to follow up, there is no surgical indication but after this timeframe might begin to consider tx -will continue to follow  Rolm Bookbinder 03/27/2018

## 2018-03-27 NOTE — Progress Notes (Signed)
ANTICOAGULATION CONSULT NOTE - Follow up [place] for heparin Indication: acute mobile DVT  No Known Allergies  Patient Measurements: Height: 5\' 4"  (162.6 cm) Weight: 224 lb 3.3 oz (101.7 kg) IBW/kg (Calculated) : 54.7 Heparin Dosing Weight: 78 kg  Vital Signs: Temp: 98.2 F (36.8 C) (05/13 0529) Temp Source: Oral (05/13 0529) BP: 131/82 (05/13 0529) Pulse Rate: 84 (05/13 0529)  Labs: Recent Labs    03/24/18 1415 03/25/18 0502  03/26/18 0236 03/26/18 1147 03/26/18 2010 03/27/18 0233  HGB 15.3* 13.8  --   --   --   --  13.1  HCT 44.5 40.9  --   --   --   --  39.2  PLT 355 290  --   --   --   --  227  HEPARINUNFRC  --   --    < > 1.10* 0.85* 0.67 0.50  CREATININE  --  0.55  --  0.56  --   --  0.54   < > = values in this interval not displayed.    Estimated Creatinine Clearance: 87.9 mL/min (by C-G formula based on SCr of 0.54 mg/dL).   Medical History: Past Medical History:  Diagnosis Date  . Chronic left hip pain   . GERD (gastroesophageal reflux disease)   . Gestational diabetes   . Hearing loss of both ears   . Hypertension   . Neuropathy    numbness/tingling from waist down  . Vision abnormalities     Medications:  Scheduled:  . lisinopril  20 mg Oral Daily  . methocarbamol  500 mg Oral BID  . metoCLOPramide (REGLAN) injection  10 mg Intravenous Q6H  . vitamin B-6  200 mg Oral Daily  . sodium chloride flush  10-40 mL Intracatheter Q12H  . tamsulosin  0.4 mg Oral QPC supper  . vitamin C  500 mg Oral BID   Infusions:  . cefTRIAXone (ROCEPHIN)  IV Stopped (03/27/18 1034)  . dextrose 5 % and 0.9 % NaCl with KCl 40 mEq/L 75 mL/hr at 03/27/18 1153  . famotidine (PEPCID) IV Stopped (03/27/18 0933)  . heparin 850 Units/hr (03/26/18 1823)    Assessment: 10 YOF admitted with thoracic myelopathy s/p T7-T8 tumor resection, currently NPO due to persistent ileus and cecum, found with acute DVT now on IV heparin in case of eventual GI  procedure. Of note patient was receiving prophylactic lovenox, last dose charted 5/10 at 1817.   Heparin level remains therapeutic at 0.5. CBC is stable and wnl, no bleeding issues documented.   Goal of Therapy:  Heparin level 0.3-0.7 units/ml Monitor platelets by anticoagulation protocol: Yes   Plan:  Continue heparin at 850 units/hr Daily heparin level and CBC  Monitor s/sx of bleeding   Thank you for allowing [place] to participate in this patients care.  Jens Som, PharmD Clinical phone for 03/27/2018 from 7a-3:30p: [phone] If after 3:30p, please call main pharmacy at: [phone] 03/27/2018 12:58 PM

## 2018-03-27 NOTE — Progress Notes (Signed)
Physical Therapy Note  Patient Details  Name: Hannah Jefferson MRN: [id number] Date of Birth: January 23, 1956 Today's Date: 03/27/2018   Pt on bedrest for acute DVTs and ileus.  Will continue to follow per POC.    Michel Santee 03/27/2018, 3:28 PM

## 2018-03-27 NOTE — Plan of Care (Signed)
  Problem: Consults Goal: RH SPINAL CORD INJURY PATIENT EDUCATION Description  See Patient Education module for education specifics.  Outcome: Progressing   Problem: RH SAFETY Goal: RH STG ADHERE TO SAFETY PRECAUTIONS W/ASSISTANCE/DEVICE Description STG Adhere to Safety Precautions With min Assistance/Device.  Outcome: Progressing Flowsheets (Taken 03/27/2018 1552) STG:Pt will adhere to safety precautions with assistance/device: 6-Modified independent   Problem: RH PAIN MANAGEMENT Goal: RH STG PAIN MANAGED AT OR BELOW PT'S PAIN GOAL Description 2 or less  Outcome: Progressing

## 2018-03-27 NOTE — Progress Notes (Addendum)
Occupational Therapy Session Note  Patient Details  Name: Hannah Jefferson MRN: [id number] Date of Birth: 06-Apr-1956  Today's Date: 03/27/2018 OT Missed Time: 75 Minutes Missed Time Reason: Patient on bedrest   Short Term Goals: Week 1:  OT Short Term Goal 1 (Week 1): Pt will be able to sit to stand with max Jefferson of 1 to prepare for LB self care. OT Short Term Goal 2 (Week 1): Pt will be able to squat pivot to w/c with max Jefferson of 1.  OT Short Term Goal 3 (Week 1): Pt will don shirt with min Jefferson. OT Short Term Goal 4 (Week 1): Pt will use AE to don pants over feet with mod Jefferson.  Skilled Therapeutic Interventions/Progress Updates:     Per RN staff, pt still on bedrest/MD hold due to mobile DVT. Will see pt for therapy when she is cleared by medical team to participate.   Therapy Documentation Precautions:  Precautions Precautions: Fall, Back Precaution Comments: LUE NWB in splint Restrictions Weight Bearing Restrictions: Yes LUE Weight Bearing: Non weight bearing General: General OT Amount of Missed Time: 75 Minutes Vital Signs: Therapy Vitals Temp: 98.2 F (36.8 C) Temp Source: Oral Pulse Rate: 84 Resp: 17 BP: 131/82 Patient Position (if appropriate): Lying Oxygen Therapy SpO2: 96 % O2 Device: Room Air ADL: ADL ADL Comments: refer to functional navigator     See Function Navigator for Current Functional Status.   Therapy/Group: Individual Therapy  Hannah Jefferson [name] 03/27/2018, 9:27 AM

## 2018-03-27 NOTE — Progress Notes (Signed)
[facility] PHYSICAL MEDICINE & REHABILITATION     PROGRESS NOTE    Subjective/Complaints: Had a reasonable night. Had a few soft bm's overnight including one large bm at 0125. Feeling better.   ROS: Patient denies fever, rash, sore throat, blurred vision,  cough, shortness of breath or chest pain, joint or back pain, headache, or mood change.    Objective: Vital Signs: Blood pressure 131/82, pulse 84, temperature 98.2 F (36.8 C), temperature source Oral, resp. rate 17, height 5\' 4"  (1.626 m), weight 101.7 kg (224 lb 3.3 oz), SpO2 96 %. Dg Abd 1 View  Result Date: 03/27/2018 CLINICAL DATA:  Ileus EXAM: ABDOMEN - 1 VIEW COMPARISON:  03/26/2018 FINDINGS: NG in the proximal stomach near the GE junction. Recommend advancing into the stomach. Surgical clips in the gallbladder fossa Progressive dilatation of large and small bowel. Lumbar scoliosis.  No renal calculi. IMPRESSION: Interval progression of large and small bowel dilatation most likely ileus versus distal small bowel obstruction. NG tube in the proximal stomach, recommend advancing NG tube. Electronically Signed   By: Franchot Gallo M.D.   On: 03/27/2018 07:17   Dg Abd 1 View  Result Date: 03/26/2018 CLINICAL DATA:  Ileus EXAM: ABDOMEN - 1 VIEW COMPARISON:  03/25/2018 FINDINGS: Tip of nasogastric tube projects over proximal stomach. Persisting gaseous distention of large and small bowel loops throughout abdomen. No bowel wall thickening or free air. Decreased cecal distention and increased small bowel distention since previous exam. Lung bases clear. Bones demineralized with levoconvex thoracolumbar scoliosis. IMPRESSION: Persistent gaseous distention of large and small bowel loops in abdomen consistent with ileus, slightly increased. Electronically Signed   By: Lavonia Dana M.D.   On: 03/26/2018 08:21   [place] Ekg Site Rite  Result Date: 03/25/2018 If Site Rite image not attached, placement could not be confirmed due to current cardiac  rhythm.  Recent Labs    03/25/18 0502 03/27/18 0233  WBC 10.4 7.0  HGB 13.8 13.1  HCT 40.9 39.2  PLT 290 227   Recent Labs    03/26/18 0236 03/27/18 0233  NA 137 137  K 3.6 3.7  CL 106 107  GLUCOSE 126* 112*  BUN 17 16  CREATININE 0.56 0.54  CALCIUM 8.7* 8.7*   CBG (last 3)  No results for input(s): GLUCAP in the last 72 hours.  Wt Readings from Last 3 Encounters:  03/21/18 101.7 kg (224 lb 3.3 oz)  03/20/18 93.4 kg (206 lb)  02/15/18 94.1 kg (207 lb 8 oz)    Physical Exam:  Constitutional: No distress . Vital signs reviewed. HEENT: EOMI, oral membranes moist Neck: supple Cardiovascular: RRR without murmur. No JVD    Respiratory: CTA Bilaterally without wheezes or rales. Normal effort    GI: infrequent BS. Abdomen less distended. Non-tender.  Musculoskeletal: She exhibits noedema. Left arm in [facility]. Full movement of fingers. Some surrounding bruising Neurological: Motor RUE 5/5. LUE limited by splint can lift arm and wiggle all fingers. Decreased sensation to LT and proprioception, left greater than right brelow T8 dermatome. LE motor 2+ to 3/5 HF, KE and 3/5---motor and sensory exam stable.  ADF/PF. DTR's 1+. No resting tone.motor/sensory exam stable.  Skin: Back incision CDI. Midline LUE Psychiatric:  Pleasant and in good spirits.    Assessment/Plan: 1. Paraplegia and functional deficits secondary to T8 myelopathy, left wrist fracture which require 3+ hours per day of interdisciplinary therapy in a comprehensive inpatient rehab setting. Physiatrist is providing close team supervision and 24 hour management  of active medical problems listed below. Physiatrist and rehab team continue to assess barriers to discharge/monitor patient progress toward functional and medical goals.  Function:  Bathing Bathing position Bathing activity did not occur: N/A Position: (sitting on BSC)  Bathing parts Body parts bathed by patient: Right arm, Left arm, Chest, Abdomen,  Right upper leg, Left upper leg Body parts bathed by helper: Front perineal area, Buttocks, Back  Bathing assist Assist Level: Assistive device, 2 helpers Assistive Device Comment: Geologist, engineering Dressing/Undressing Upper body dressing   What is the patient wearing?: Button up shirt Bra - Perfomed by patient: Thread/unthread left bra strap, Thread/unthread right bra strap Bra - Perfomed by helper: Hook/unhook bra (pull down sports bra) Pull over shirt/dress - Perfomed by patient: Thread/unthread right sleeve, Thread/unthread left sleeve, Put head through opening Pull over shirt/dress - Perfomed by helper: Put head through opening, Pull shirt over trunk Button up shirt - Perfomed by patient: Thread/unthread right sleeve, Thread/unthread left sleeve, Button/unbutton shirt Button up shirt - Perfomed by helper: Pull shirt around back    Upper body assist Assist Level: Touching or steadying assistance(Pt > 75%)      Lower Body Dressing/Undressing Lower body dressing Lower body dressing/undressing activity did not occur: N/A What is the patient wearing?: Pants, Non-skid slipper socks       Pants- Performed by helper: Thread/unthread right pants leg, Thread/unthread left pants leg, Pull pants up/down   Non-skid slipper socks- Performed by helper: Don/doff right sock, Don/doff left sock                  Lower body assist Assist for lower body dressing: 2 Helpers      Toileting Toileting     Toileting steps completed by helper: Performs perineal hygiene(wears brief)    Toileting assist Assist level: Touching or steadying assistance (Pt.75%)   Transfers Chair/bed transfer   Chair/bed transfer method: Other Chair/bed transfer assist level: 2 helpers Chair/bed transfer assistive device: Mechanical lift Mechanical lift: Ecologist Ambulation activity did not occur: Safety/medical concerns         Wheelchair   Type: Manual Max wheelchair  distance: 150 Assist Level: Touching or steadying assistance (Pt > 75%)  Cognition Comprehension Comprehension assist level: Follows complex conversation/direction with no assist  Expression Expression assist level: Expresses complex ideas: With no assist  Social Interaction Social Interaction assist level: Interacts appropriately with others - No medications needed.  Problem Solving Problem solving assist level: Solves complex 90% of the time/cues < 10% of the time  Memory Memory assist level: Complete Independence: No helper   Medical Problem List and Plan: 1.Paraplegia and funtional deficitssecondary to T8 meningioma with myeopathy -recent left wrist fracture -continue PT, OT 2. DVT  Right common femoral vein (mobile), right posterior tibial and peroneal vein thrombi:  -continue IV heparin  -given that little heparin was given on 5/11, I will keep her on bedrest again today.  3. Pain Management:Hydrocodone prn 4. Mood:LCSW to follow for evaluation and support. 5. Neuropsych: This patientiscapable of making decisions on herown behalf. 6. Skin/Wound Care:Routine pressure relief measures. 7. Fluids/Electrolytes/Nutrition:Monitor I/O.    -potassium 3.7 today  -continue k+ in IVF   -repeat runs of vitamin k x 3 today  -daily labs 8. Post op ileus/neurogenic bowel:  -bowels beginning to move.  -450cc out via suction over 24hr  -KUB still shows substantial cecal distention  - continue NPO  -continue IV reglan  -GI and surgery  following  -  place PICC today in place of midline 9.Neurogenic bladder:foley out today, voiding trial/caths  -E coli UTI--continue rocephin q24 10. GERD:managed withPPIbid. 11. Hyponatremia: 137 today  -hctz stopped  -continue D5NS IV  12.Leftwrist fracture: NWB. [place]   LOS (Days) [place] EVALUATION WAS PERFORMED  Meredith Staggers, MD 03/27/2018 8:43 AM

## 2018-03-27 NOTE — Progress Notes (Addendum)
Dr. Cristal Generous note reviewed, and I agree.  Pt does not seem to be making much net progress.  K on the low side at 3.7--being replaced, but especially w/ NG suction, I think she is going to need a lot more.  NG tube has been advanced (per pt), as advised on today's KUB.  From my review of KUB, it does NOT appear that she has fecal obstipation.  On exam, pt lying flat in bed, in intermittent pain b/o abd discomfort and thoracic spasms.  Sparse tinkling bowel sounds are present.  There is moderate distension (equiv to about 8 mos of pregnancy) and diffuse tympany, no overt tenderness.  IMPR:  I remain unclear as to whether this pt's bowel dysfunction is on an anatomic basis (doubt), a post-anesthetic ileus as is sometimes seen (esp after orthopedic procedures), or some sort of autonomic reflex as a consequence of her spine surgery.  RECOMMENDATIONS:  1. Agree w/ Dr. Chase Picket of abd/pelvis would be helpful if primary team ok w/ it 2. Would check K+ level TWICE daily and continue aggressive IV supplementation to try to keep level at least at 4.0, preferably around 4.5.  Call if questions; will follow.  Cleotis Nipper, M.D. Pager [phone] If no answer or after 5 PM call [phone]  Addendum:  Spoke w/ Dr. Percell Boston DOES see impairment of intestinal peristalsis w/ low thoracic spinal cord injuries, which makes me even more inclined to think that her ileus is functional rather than anatomic in character.  Going along with that is some evidence of some element of neurogenic bladder, with some urinary retention since her surgery (now voiding spontaneously).--RB

## 2018-03-27 NOTE — Progress Notes (Signed)
Occupational Therapy Session Note  Patient Details  Name: LAWSON ISABELL MRN: [id number] Date of Birth: 1956-11-14  Today's Date: 03/27/2018 OT Missed Time: 105 Minutes Missed Time Reason: Patient on bedrest   Pt cont to be on bed rest at this time. Will cont POC as pt able to return to activity with medical stability.    Owens Hara L 03/27/2018, 7:14 AM

## 2018-03-28 ENCOUNTER — Inpatient Hospital Stay (HOSPITAL_COMMUNITY): Payer: 59

## 2018-03-28 ENCOUNTER — Inpatient Hospital Stay (HOSPITAL_COMMUNITY): Payer: 59 | Admitting: Occupational Therapy

## 2018-03-28 ENCOUNTER — Inpatient Hospital Stay (HOSPITAL_COMMUNITY): Payer: 59 | Admitting: Physical Therapy

## 2018-03-28 LAB — BASIC METABOLIC PANEL
Anion gap: 9 (ref 5–15)
Anion gap: 9 (ref 5–15)
BUN: 11 mg/dL (ref 6–20)
BUN: 11 mg/dL (ref 6–20)
CO2: 19 mmol/L — ABNORMAL LOW (ref 22–32)
CO2: 20 mmol/L — ABNORMAL LOW (ref 22–32)
Calcium: 9 mg/dL (ref 8.9–10.3)
Calcium: 9.3 mg/dL (ref 8.9–10.3)
Chloride: 105 mmol/L (ref 101–111)
Chloride: 105 mmol/L (ref 101–111)
Creatinine, Ser: 0.55 mg/dL (ref 0.44–1.00)
Creatinine, Ser: 0.55 mg/dL (ref 0.44–1.00)
GFR calc Af Amer: >60 mL/min (ref >60)
GFR calc Af Amer: >60 mL/min (ref >60)
GFR calc non Af Amer: >60 mL/min (ref >60)
GFR calc non Af Amer: >60 mL/min (ref >60)
Glucose, Bld: 113 mg/dL — ABNORMAL HIGH (ref 65–99)
Glucose, Bld: 115 mg/dL — ABNORMAL HIGH (ref 65–99)
Potassium: 3.8 mmol/L (ref 3.5–5.1)
Potassium: 3.5 mmol/L (ref 3.5–5.1)
Sodium: 133 mmol/L — ABNORMAL LOW (ref 135–145)
Sodium: 134 mmol/L — ABNORMAL LOW (ref 135–145)

## 2018-03-28 LAB — CBC
HCT: 42.2 % (ref 36.0–46.0)
Hemoglobin: 14.3 g/dL (ref 12.0–15.0)
MCH: 29.5 pg (ref 26.0–34.0)
MCHC: 33.9 g/dL (ref 30.0–36.0)
MCV: 87 fL (ref 78.0–100.0)
Platelets: 266 10*3/uL (ref 150–400)
RBC: 4.85 MIL/uL (ref 3.87–5.11)
RDW: 14.2 % (ref 11.5–15.5)
WBC: 9.1 10*3/uL (ref 4.0–10.5)

## 2018-03-28 LAB — HEPARIN LEVEL (UNFRACTIONATED)
Heparin Unfractionated: 0.22 IU/mL — ABNORMAL LOW (ref 0.30–0.70)
Heparin Unfractionated: 0.4 IU/mL (ref 0.30–0.70)

## 2018-03-28 MED ORDER — LIDOCAINE HCL (PF) 1 % IJ SOLN
INTRAMUSCULAR | Status: AC | PRN
  Administered 2018-03-28: 2 mL

## 2018-03-28 MED ORDER — GENERIC EXTERNAL MEDICATION
Status: DC
Start: ? — End: 2018-03-28

## 2018-03-28 MED ORDER — LIDOCAINE HCL 1 % IJ SOLN
INTRAMUSCULAR | Status: AC
  Filled 2018-03-28: qty 20

## 2018-03-28 MED ORDER — POTASSIUM CHLORIDE 10 MEQ/100ML IV SOLN
10.0000 meq | INTRAVENOUS | Status: AC
  Administered 2018-03-28 (×3): 10 meq via INTRAVENOUS
  Filled 2018-03-28 (×3): qty 100

## 2018-03-28 NOTE — Procedures (Signed)
Poor peripheral access  S/p LUE DL POWER PICC  No comp Stable Tip svcra Ready for use Full report in pacs

## 2018-03-28 NOTE — Progress Notes (Signed)
[place] PHYSICAL MEDICINE & REHABILITATION     PROGRESS NOTE    Subjective/Complaints: Did fairly well overnight. Belly feels better. Denies pain. Still no PICC  ROS: Patient denies fever, rash, sore throat, blurred vision, nausea, vomiting, diarrhea, cough, shortness of breath or chest pain, joint or back pain, headache, or mood change.   Objective: Vital Signs: Blood pressure (!) 136/97, pulse 74, temperature 98.4 F (36.9 C), temperature source Oral, resp. rate 16, height 5\' 4"  (1.626 m), weight 101.7 kg (224 lb 3.3 oz), SpO2 97 %. Ct Abdomen Pelvis Wo Contrast  Result Date: 03/27/2018 CLINICAL DATA:  Chronic ileus postop back surgery. Abdominal discomfort and thoracic spasms. Moderate abdominal distension EXAM: CT ABDOMEN AND PELVIS WITHOUT CONTRAST TECHNIQUE: Multidetector CT imaging of the abdomen and pelvis was performed following the standard protocol without IV contrast. COMPARISON:  Plain film of the abdomen from earlier same day. FINDINGS: Lower chest: No acute abnormality.  Small hiatal hernia. Hepatobiliary: No focal liver abnormality is seen. Status post cholecystectomy. No biliary dilatation. Pancreas: Unremarkable. No pancreatic ductal dilatation or surrounding inflammatory changes. Spleen: Normal in size without focal abnormality. Adrenals/Urinary Tract: Adrenal glands appear normal. Kidneys are unremarkable without mass, stone or hydronephrosis. No ureteral or bladder calculi identified. Bladder is unremarkable. Small amount of air within the bladder is presumably related to recent catheterization. Stomach/Bowel: Fluid and gas throughout the colon, mildly to moderately distended throughout. Distal small bowel is upper normal in caliber, oral contrast present to the level of the ileum. Perhaps a small amount of oral contrast layering within the cecum. Enteric tube in place with tip adequately positioned at the level of the proximal duodenum. Vascular/Lymphatic: No significant  vascular findings are present. No enlarged abdominal or pelvic lymph nodes. Reproductive: Uterus and bilateral adnexa are unremarkable. Other: No free fluid or abscess collection. No free intraperitoneal air. Musculoskeletal: No acute appearing osseous abnormality. Moderate scoliosis of the thoracolumbar spine. Chronic bilateral pars interarticularis defects at L5-S1 with grade 1/2 spondylolisthesis of L5. IMPRESSION: 1. Findings are most compatible with postoperative ileus. No evidence of a mechanical bowel obstruction identified. Recommend follow-up plain film examinations to ensure resolution, and to visualize the eventual passage of the oral contrast to the large bowel (there is likely a small amount of layering oral contrast within the cecum). Enteric tube adequately positioned with tip at the level of the duodenal bulb. 2. Small hiatal hernia. Electronically Signed   By: Franki Cabot M.D.   On: 03/27/2018 19:29   Dg Abd 1 View  Result Date: 03/27/2018 CLINICAL DATA:  Ileus EXAM: ABDOMEN - 1 VIEW COMPARISON:  03/26/2018 FINDINGS: NG in the proximal stomach near the GE junction. Recommend advancing into the stomach. Surgical clips in the gallbladder fossa Progressive dilatation of large and small bowel. Lumbar scoliosis.  No renal calculi. IMPRESSION: Interval progression of large and small bowel dilatation most likely ileus versus distal small bowel obstruction. NG tube in the proximal stomach, recommend advancing NG tube. Electronically Signed   By: Franchot Gallo M.D.   On: 03/27/2018 07:17   Recent Labs    03/27/18 0233 03/28/18 0543  WBC 7.0 9.1  HGB 13.1 14.3  HCT 39.2 42.2  PLT 227 266   Recent Labs    03/27/18 1431 03/28/18 0543  NA 136 133*  K 3.7 3.8  CL 106 105  GLUCOSE 91 113*  BUN 15 11  CREATININE 0.59 0.55  CALCIUM 9.1 9.0   CBG (last 3)  No results for input(s):  GLUCAP in the last 72 hours.  Wt Readings from Last 3 Encounters:  03/21/18 101.7 kg (224 lb 3.3 oz)   03/20/18 93.4 kg (206 lb)  02/15/18 94.1 kg (207 lb 8 oz)    Physical Exam:  Constitutional: No distress . Vital signs reviewed. HEENT: EOMI, oral membranes moist Neck: supple Cardiovascular: RRR without murmur. No JVD    Respiratory: CTA Bilaterally without wheezes or rales. Normal effort    GI: scarce bowel sounds. Still distended. Non tender Musculoskeletal: She exhibits noedema. Left arm in [facility]. Full movement of fingers. Some surrounding bruising Neurological: Motor RUE 5/5. LUE limited by splint can lift arm and wiggle all fingers. Decreased sensation to LT and proprioception, left greater than right brelow T8 dermatome. LE motor 2+ to 3/5 HF, KE and 3/5---motor and sensory exam stable.  ADF/PF. DTR's 1+. No resting tone.motor stable.  Skin: Back incision CDI. Midline LUE Psychiatric:  Remains in good spirits.    Assessment/Plan: 1. Paraplegia and functional deficits secondary to T8 myelopathy, left wrist fracture which require 3+ hours per day of interdisciplinary therapy in a comprehensive inpatient rehab setting. Physiatrist is providing close team supervision and 24 hour management of active medical problems listed below. Physiatrist and rehab team continue to assess barriers to discharge/monitor patient progress toward functional and medical goals.  Function:  Bathing Bathing position Bathing activity did not occur: N/A Position: (sitting on BSC)  Bathing parts Body parts bathed by patient: Right arm, Left arm, Chest, Abdomen, Right upper leg, Left upper leg Body parts bathed by helper: Front perineal area, Buttocks, Back  Bathing assist Assist Level: Assistive device, 2 helpers Assistive Device Comment: Geologist, engineering Dressing/Undressing Upper body dressing   What is the patient wearing?: Button up shirt Bra - Perfomed by patient: Thread/unthread left bra strap, Thread/unthread right bra strap Bra - Perfomed by helper: Hook/unhook bra (pull down sports  bra) Pull over shirt/dress - Perfomed by patient: Thread/unthread right sleeve, Thread/unthread left sleeve, Put head through opening Pull over shirt/dress - Perfomed by helper: Put head through opening, Pull shirt over trunk Button up shirt - Perfomed by patient: Thread/unthread right sleeve, Thread/unthread left sleeve, Button/unbutton shirt Button up shirt - Perfomed by helper: Pull shirt around back    Upper body assist Assist Level: Touching or steadying assistance(Pt > 75%)      Lower Body Dressing/Undressing Lower body dressing Lower body dressing/undressing activity did not occur: N/A What is the patient wearing?: Pants, Non-skid slipper socks       Pants- Performed by helper: Thread/unthread right pants leg, Thread/unthread left pants leg, Pull pants up/down   Non-skid slipper socks- Performed by helper: Don/doff right sock, Don/doff left sock                  Lower body assist Assist for lower body dressing: 2 Helpers      Toileting Toileting     Toileting steps completed by helper: Performs perineal hygiene    Toileting assist Assist level: Touching or steadying assistance (Pt.75%)   Transfers Chair/bed transfer   Chair/bed transfer method: Other Chair/bed transfer assist level: 2 helpers Chair/bed transfer assistive device: Mechanical lift Mechanical lift: Ecologist Ambulation activity did not occur: Safety/medical concerns         Wheelchair   Type: Manual Max wheelchair distance: 150 Assist Level: Touching or steadying assistance (Pt > 75%)  Cognition Comprehension Comprehension assist level: Follows complex conversation/direction with no assist  Expression Expression  assist level: Expresses complex ideas: With no assist  Social Interaction Social Interaction assist level: Interacts appropriately with others - No medications needed.  Problem Solving Problem solving assist level: Solves complex 90% of the time/cues < 10% of the  time  Memory Memory assist level: Complete Independence: No helper   Medical Problem List and Plan: 1.Paraplegia and funtional deficitssecondary to T8 meningioma with myeopathy -recent left wrist fracture -continue PT, OT 2. DVT  Right common femoral vein (mobile), right posterior tibial and peroneal vein thrombi:  -continue IV heparin  -OOB today.  3. Pain Management:Hydrocodone prn 4. Mood:LCSW to follow for evaluation and support. 5. Neuropsych: This patientiscapable of making decisions on herown behalf. 6. Skin/Wound Care:Routine pressure relief measures. 7. Fluids/Electrolytes/Nutrition:Monitor I/O.    -potassium 3.8 today  -continue k+ in IVF   -repeat runs of vitamin k x 3 today  -recheck labs this afternoon. 8. Post op ileus/neurogenic bowel:  -several BM's this morning  -clamp NGT  -KUB beginning to show improvement. CT demonstrates ileus without obstruction.  - continue NPO  -continue IV reglan  -GI and surgery following---appreciate their help 9.Neurogenic bladder:foley out today, voiding trial/caths  -E coli UTI--continue rocephin q24 10. GERD:managed withPPIbid. 11. Hyponatremia: 137 today  -hctz stopped  -continue D5NS IV  12.Leftwrist fracture: NWB. [place]   LOS (Days) [place] EVALUATION WAS PERFORMED  Meredith Staggers, MD 03/28/2018 8:57 AM

## 2018-03-28 NOTE — Progress Notes (Signed)
Subjective/Chief Complaint: Some flatus and stool   Objective: Vital signs in last 24 hours: Temp:  [97.6 F (36.4 C)-98.4 F (36.9 C)] 98.4 F (36.9 C) (05/14 0551) Pulse Rate:  [74-85] 74 (05/14 0551) Resp:  [16-17] 16 (05/14 0551) BP: (132-141)/(83-97) 136/97 (05/14 0551) SpO2:  [97 %-99 %] 97 % (05/14 0551) Last BM Date: 03/28/18  Intake/Output from previous day: 05/13 0701 - 05/14 0700 In: 100 [NG/GT:100] Out: 700 [Emesis/NG output:700] Intake/Output this shift: Total I/O In: -  Out: 600 [Urine:600]  GI: some bs less distended nontender  Lab Results:  Recent Labs    03/27/18 0233 03/28/18 0543  WBC 7.0 9.1  HGB 13.1 14.3  HCT 39.2 42.2  PLT 227 266   BMET Recent Labs    03/27/18 1431 03/28/18 0543  NA 136 133*  K 3.7 3.8  CL 106 105  CO2 22 19*  GLUCOSE 91 113*  BUN 15 11  CREATININE 0.59 0.55  CALCIUM 9.1 9.0   PT/INR No results for input(s): LABPROT, INR in the last 72 hours. ABG No results for input(s): PHART, HCO3 in the last 72 hours.  Invalid input(s): PCO2, PO2  Studies/Results: Ct Abdomen Pelvis Wo Contrast  Result Date: 03/27/2018 CLINICAL DATA:  Chronic ileus postop back surgery. Abdominal discomfort and thoracic spasms. Moderate abdominal distension EXAM: CT ABDOMEN AND PELVIS WITHOUT CONTRAST TECHNIQUE: Multidetector CT imaging of the abdomen and pelvis was performed following the standard protocol without IV contrast. COMPARISON:  Plain film of the abdomen from earlier same day. FINDINGS: Lower chest: No acute abnormality.  Small hiatal hernia. Hepatobiliary: No focal liver abnormality is seen. Status post cholecystectomy. No biliary dilatation. Pancreas: Unremarkable. No pancreatic ductal dilatation or surrounding inflammatory changes. Spleen: Normal in size without focal abnormality. Adrenals/Urinary Tract: Adrenal glands appear normal. Kidneys are unremarkable without mass, stone or hydronephrosis. No ureteral or bladder calculi  identified. Bladder is unremarkable. Small amount of air within the bladder is presumably related to recent catheterization. Stomach/Bowel: Fluid and gas throughout the colon, mildly to moderately distended throughout. Distal small bowel is upper normal in caliber, oral contrast present to the level of the ileum. Perhaps a small amount of oral contrast layering within the cecum. Enteric tube in place with tip adequately positioned at the level of the proximal duodenum. Vascular/Lymphatic: No significant vascular findings are present. No enlarged abdominal or pelvic lymph nodes. Reproductive: Uterus and bilateral adnexa are unremarkable. Other: No free fluid or abscess collection. No free intraperitoneal air. Musculoskeletal: No acute appearing osseous abnormality. Moderate scoliosis of the thoracolumbar spine. Chronic bilateral pars interarticularis defects at L5-S1 with grade 1/2 spondylolisthesis of L5. IMPRESSION: 1. Findings are most compatible with postoperative ileus. No evidence of a mechanical bowel obstruction identified. Recommend follow-up plain film examinations to ensure resolution, and to visualize the eventual passage of the oral contrast to the large bowel (there is likely a small amount of layering oral contrast within the cecum). Enteric tube adequately positioned with tip at the level of the duodenal bulb. 2. Small hiatal hernia. Electronically Signed   By: Franki Cabot M.D.   On: 03/27/2018 19:29   Dg Abd 1 View  Result Date: 03/27/2018 CLINICAL DATA:  Ileus EXAM: ABDOMEN - 1 VIEW COMPARISON:  03/26/2018 FINDINGS: NG in the proximal stomach near the GE junction. Recommend advancing into the stomach. Surgical clips in the gallbladder fossa Progressive dilatation of large and small bowel. Lumbar scoliosis.  No renal calculi. IMPRESSION: Interval progression of large and small bowel  dilatation most likely ileus versus distal small bowel obstruction. NG tube in the proximal stomach, recommend  advancing NG tube. Electronically Signed   By: Franchot Gallo M.D.   On: 03/27/2018 07:17    Anti-infectives: Anti-infectives (From admission, onward)   Start     Dose/Rate Route Frequency Ordered Stop   03/24/18 1000  cefTRIAXone (ROCEPHIN) 1 g in sodium chloride 0.9 % 100 mL IVPB     1 g 200 mL/hr over 30 Minutes Intravenous Every 24 hours 03/24/18 0855     03/23/18 1400  cephALEXin (KEFLEX) 250 MG/5ML suspension 500 mg  Status:  Discontinued     500 mg Oral Every 8 hours 03/23/18 1134 03/24/18 0855      Assessment/Plan: Ileus  -ct appears to have ileus -clinically better today -clamp ng and see how she does  Rolm Bookbinder 03/28/2018

## 2018-03-28 NOTE — Progress Notes (Signed)
Occupational Therapy Session Note  Patient Details  Name: Hannah Jefferson MRN: [id number] Date of Birth: [birth year]  Today's Date: 03/28/2018 OT Individual Time: 1735-6701 OT Individual Time Calculation (min): 70 min    Short Term Goals: Week 1:  OT Short Term Goal 1 (Week 1): Pt will be able to sit to stand with max A of 1 to prepare for LB self care. OT Short Term Goal 2 (Week 1): Pt will be able to squat pivot to w/c with max A of 1.  OT Short Term Goal 3 (Week 1): Pt will don shirt with min A. OT Short Term Goal 4 (Week 1): Pt will use AE to don pants over feet with mod A. Week 2:     Skilled Therapeutic Interventions/Progress Updates:    1:1 Focus on slide board transfers with instructions for head hip relationship. Lateral leans for board placement. Max A transfers in and out of tilt in space w/c. On the mat continued to focus on sit to stands with SARA with walking sling for support. Focus on hip//core control and sustained hip and knee extension. Tolerate Sit to stands 4 times. Positioning in tilt and space with education to family member to perform pressure relief.   Therapy Documentation Precautions:  Precautions Precautions: Fall, Back Precaution Comments: LUE NWB in splint Restrictions Weight Bearing Restrictions: Yes LUE Weight Bearing: Non weight bearing   Pain:  soreness but able to continue; allowed for rest break as needed ADL: ADL ADL Comments: refer to functional navigator  See Function Navigator for Current Functional Status.   Therapy/Group: Individual Therapy  Willeen Cass [facility] 03/28/2018, 3:37 PM

## 2018-03-28 NOTE — Progress Notes (Signed)
Recreational Therapy Session Note  Patient Details  Name: Hannah Jefferson MRN: [id number] Date of Birth: 03/29/1956 Today's Date: 03/28/2018  TR eval deferred as pt is not yet appropriate for TR services.  Will continue to monitor through team for future participation. [place] 03/28/2018, 11:43 AM

## 2018-03-28 NOTE — Progress Notes (Signed)
Doing better: feels better.  No nausea, even w/ NG clamped.   CT yest c/w colonic ileus.  K+ this morning stable at 3.8.  KUB this morning (reviewed):  Mild/mod diffuse colonic distension, w/ elim of most contrast from yest's CT (small amount in rectum?)  On exam, appears more comfortable.  Mild distension but much less tympany than yesterday.  BS's quiet.  IMPR:  Improving intestinal ileus, now primarily colonic in location.  PLAN:  Continue aggressive K+ supplementation. Consider rectal tube if she doesn't continue to improve. Would have to get significantly more colonic distension to justify decompressive colonoscopy.  Cleotis Nipper, M.D. Pager [phone] If no answer or after 5 PM call [phone]

## 2018-03-28 NOTE — Progress Notes (Signed)
Physical Therapy Session Note  Patient Details  Name: Hannah Jefferson MRN: [id number] Date of Birth: 1956-01-29  Today's Date: 03/28/2018 PT Individual Time: 0900-1000 PT Individual Time Calculation (min): 60 min   Short Term Goals: Week 1:  PT Short Term Goal 1 (Week 1): Pt will verbalize 2 methods of pressure relief in w/c with min cues PT Short Term Goal 2 (Week 1): Pt will tolerate supported standing x1 minute for decreased burden of care with ADLs PT Short Term Goal 3 (Week 1): Pt will initiate w/c propulsion for UE strengthening and cardiovascular endurance PT Short Term Goal 4 (Week 1): Pt will utilize LEs and RUE to assist with bed mobility in 100% of opportunities with min cues  Skilled Therapeutic Interventions/Progress Updates:    Pt cleared for OOB today and off of bedrest. RN notified to clamp NG to allow for increased mobility. Educated pt and sister in law on slowing increasing tolerance to OOB today and awareness to possible symptoms of orthostasis from being on bedrest last several days. Pt able to tolerate well with time to adjust to each position. Pt requires encouragement as frustrated with increased difficulty with transfers today but does well with positive reinforcement. Mod to max assist to come to EOB using log roll technique to adhere to back precautions. Requires max assist at trunk. Reports mild dizziness with initial EOB but resolves in a few minutes. PT donned shoes and aircast with total assist while pt maintained functional dynamic sitting balance with min progressing to supervision. Multiple attempts with [facility] for sit <> stand from elevated bed height but then able to complete with max +2 assist and transferred to [facility] as pt reports need for BM. On BSC, pt able to have continent (liquid) BM, requires total +2 for hygiene and max/total +2 for sit <> stand from low surface, max assist from perched position. Transferred to TIS w/c to allow for ability for sister in law  to assist with pressure relief (demonstrated technique and recommendation for tilt every 30 min up in w/c) and increase tolerance for BLE elevated and ability to quickly change positions if pt unable to tolerate full upright position. Pt and sister in law receptive to all education provided and left set up at sink to finish hair and oral hygiene.   Therapy Documentation Precautions:  Precautions Precautions: Fall, Back Precaution Comments: LUE NWB in splint Restrictions Weight Bearing Restrictions: Yes LUE Weight Bearing: Non weight bearing   Pain:  premedicated for pain mainly in abdomen. Overall pt denies complaints.    See Function Navigator for Current Functional Status.   Therapy/Group: Individual Therapy  Canary Brim Ivory Broad, PT, DPT  03/28/2018, 11:12 AM

## 2018-03-28 NOTE — Progress Notes (Signed)
ANTICOAGULATION CONSULT NOTE - Follow up [place] for heparin Indication: acute mobile DVT  No Known Allergies  Patient Measurements: Height: 5\' 4"  (162.6 cm) Weight: 224 lb 3.3 oz (101.7 kg) IBW/kg (Calculated) : 54.7 Heparin Dosing Weight: 78 kg  Vital Signs: Temp: 98.4 F (36.9 C) (05/14 0551) Temp Source: Oral (05/14 0551) BP: 136/97 (05/14 0551) Pulse Rate: 74 (05/14 0551)  Labs: Recent Labs    03/27/18 0233 03/27/18 1431 03/28/18 0543 03/28/18 1243  HGB 13.1  --  14.3  --   HCT 39.2  --  42.2  --   PLT 227  --  266  --   HEPARINUNFRC 0.50  --  0.22* 0.40  CREATININE 0.54 0.59 0.55  --     Estimated Creatinine Clearance: 87.9 mL/min (by C-G formula based on SCr of 0.55 mg/dL).   Medical History: Past Medical History:  Diagnosis Date  . Chronic left hip pain   . GERD (gastroesophageal reflux disease)   . Gestational diabetes   . Hearing loss of both ears   . Hypertension   . Neuropathy    numbness/tingling from waist down  . Vision abnormalities     Medications:  Scheduled:  . lisinopril  20 mg Oral Daily  . methocarbamol  500 mg Oral BID  . metoCLOPramide (REGLAN) injection  10 mg Intravenous Q6H  . vitamin B-6  200 mg Oral Daily  . sodium chloride flush  10-40 mL Intracatheter Q12H  . tamsulosin  0.4 mg Oral QPC supper  . vitamin C  500 mg Oral BID   Infusions:  . cefTRIAXone (ROCEPHIN)  IV Stopped (03/28/18 1004)  . dextrose 5 % and 0.9 % NaCl with KCl 40 mEq/L 75 mL/hr at 03/28/18 0837  . famotidine (PEPCID) IV Stopped (03/28/18 0910)  . heparin 1,050 Units/hr (03/28/18 0720)  . potassium chloride 10 mEq (03/28/18 1238)    Assessment: 30 YOF admitted with thoracic myelopathy s/p T7-T8 tumor resection, currently NPO due to persistent ileus and cecum, found with acute DVT now on IV heparin in case of eventual GI procedure.   Heparin level now therapeutic at 0.4 after rate increase this morning. No bleeding noted, CBC  stable  Goal of Therapy:  Heparin level 0.3-0.7 units/ml Monitor platelets by anticoagulation protocol: Yes   Plan:  Continue heparin gtt at 1050 units/hr Confirmatory heparin level in 6 hours Daily heparin level and CBC  Monitor s/sx of bleeding   Thank you for allowing [place] to participate in this patients care.  Jens Som, PharmD Clinical phone for 03/28/2018 from 7a-3:30p: [phone] If after 3:30p, please call main pharmacy at: [phone] 03/28/2018 1:31 PM

## 2018-03-28 NOTE — Progress Notes (Signed)
Physical Therapy Session Note  Patient Details  Name: [name] MRN: [id number] Date of Birth: [birth year]  Today's Date: 03/28/2018 PT Individual Time: 1300-1353 PT Individual Time Calculation (min): 53 min   Short Term Goals: Week 1:  PT Short Term Goal 1 (Week 1): Pt will verbalize 2 methods of pressure relief in w/c with min cues PT Short Term Goal 2 (Week 1): Pt will tolerate supported standing x1 minute for decreased burden of care with ADLs PT Short Term Goal 3 (Week 1): Pt will initiate w/c propulsion for UE strengthening and cardiovascular endurance PT Short Term Goal 4 (Week 1): Pt will utilize LEs and RUE to assist with bed mobility in 100% of opportunities with min cues  Skilled Therapeutic Interventions/Progress Updates:    Pt up in TIS w/c upon arrival and agreeable to PT session. Reports that her backside is getting sore. Changed tilt for pressure relief. Transfers: sit<>stand performed from w/c using stedy and max assist at pelvis. Repeated X4 during session from [facility] with focus on posture and endurance. Sitting in stedy with hands in lap for increased trunk and balance control. Multiple breaks needed due to fatigue. Pt returned to bed after session and transport arriving to take pt to procedure. Pt left with all needs in reach and family present.   Therapy Documentation Precautions:  Precautions Precautions: Fall, Back Precaution Comments: LUE NWB in splint Restrictions Weight Bearing Restrictions: Yes LUE Weight Bearing: Non weight bearing General: PT Amount of Missed Time (min): 7 Minutes PT Missed Treatment Reason: Unavailable (Comment)(transport for procedure)  Pain:  Reports no pain currently.   See Function Navigator for Current Functional Status.   Therapy/Group: Individual Therapy  Linard Millers, PT 03/28/2018, 2:04 PM

## 2018-03-28 NOTE — Progress Notes (Addendum)
ANTICOAGULATION CONSULT NOTE - Follow up [place] for heparin Indication: acute mobile DVT  No Known Allergies  Patient Measurements: Height: 5\' 4"  (162.6 cm) Weight: 224 lb 3.3 oz (101.7 kg) IBW/kg (Calculated) : 54.7 Heparin Dosing Weight: 78 kg  Vital Signs: Temp: 98.4 F (36.9 C) (05/14 0551) Temp Source: Oral (05/14 0551) BP: 136/97 (05/14 0551) Pulse Rate: 74 (05/14 0551)  Labs: Recent Labs    03/26/18 2010 03/27/18 0233 03/27/18 1431 03/28/18 0543  HGB  --  13.1  --  14.3  HCT  --  39.2  --  42.2  PLT  --  227  --  266  HEPARINUNFRC 0.67 0.50  --  0.22*  CREATININE  --  0.54 0.59 0.55    Estimated Creatinine Clearance: 87.9 mL/min (by C-G formula based on SCr of 0.55 mg/dL).   Medical History: Past Medical History:  Diagnosis Date  . Chronic left hip pain   . GERD (gastroesophageal reflux disease)   . Gestational diabetes   . Hearing loss of both ears   . Hypertension   . Neuropathy    numbness/tingling from waist down  . Vision abnormalities     Medications:  Scheduled:  . lisinopril  20 mg Oral Daily  . methocarbamol  500 mg Oral BID  . metoCLOPramide (REGLAN) injection  10 mg Intravenous Q6H  . vitamin B-6  200 mg Oral Daily  . sodium chloride flush  10-40 mL Intracatheter Q12H  . tamsulosin  0.4 mg Oral QPC supper  . vitamin C  500 mg Oral BID   Infusions:  . cefTRIAXone (ROCEPHIN)  IV Stopped (03/27/18 1034)  . dextrose 5 % and 0.9 % NaCl with KCl 40 mEq/L 75 mL/hr at 03/27/18 1153  . famotidine (PEPCID) IV Stopped (03/27/18 2356)  . heparin 850 Units/hr (03/27/18 2123)    Assessment: 49 YOF admitted with thoracic myelopathy s/p T7-T8 tumor resection, currently NPO due to persistent ileus and cecum, found with acute DVT now on IV heparin in case of eventual GI procedure.   Heparin level subtherapeutic: 0.22, RN confirmed there was no issues with the infusion overnight. No overt bleeding, CBC stable  Goal of Therapy:   Heparin level 0.3-0.7 units/ml Monitor platelets by anticoagulation protocol: Yes   Plan:  Increase heparin gtt to 1050 units/hr (increase ~2 units/kg/hr) Heparin level in 6 hours Daily heparin level and CBC  Monitor s/sx of bleeding   Thank you for allowing [place] to participate in this patients care.  Georga Bora, PharmD Clinical Pharmacist 03/28/2018 6:36 AM

## 2018-03-29 ENCOUNTER — Inpatient Hospital Stay (HOSPITAL_COMMUNITY): Payer: 59

## 2018-03-29 ENCOUNTER — Inpatient Hospital Stay (HOSPITAL_COMMUNITY): Payer: 59 | Admitting: *Deleted

## 2018-03-29 ENCOUNTER — Inpatient Hospital Stay (HOSPITAL_COMMUNITY): Payer: 59 | Admitting: Physical Therapy

## 2018-03-29 ENCOUNTER — Inpatient Hospital Stay (HOSPITAL_COMMUNITY): Payer: 59 | Admitting: Occupational Therapy

## 2018-03-29 LAB — CBC
HCT: 41.5 % (ref 36.0–46.0)
Hemoglobin: 13.8 g/dL (ref 12.0–15.0)
MCH: 29.1 pg (ref 26.0–34.0)
MCHC: 33.3 g/dL (ref 30.0–36.0)
MCV: 87.6 fL (ref 78.0–100.0)
Platelets: 243 10*3/uL (ref 150–400)
RBC: 4.74 MIL/uL (ref 3.87–5.11)
RDW: 13.9 % (ref 11.5–15.5)
WBC: 6.6 10*3/uL (ref 4.0–10.5)

## 2018-03-29 LAB — BASIC METABOLIC PANEL
Anion gap: 8 (ref 5–15)
Anion gap: 10 (ref 5–15)
BUN: 9 mg/dL (ref 6–20)
BUN: 9 mg/dL (ref 6–20)
CO2: 18 mmol/L — ABNORMAL LOW (ref 22–32)
CO2: 18 mmol/L — ABNORMAL LOW (ref 22–32)
Calcium: 8.9 mg/dL (ref 8.9–10.3)
Calcium: 9.1 mg/dL (ref 8.9–10.3)
Chloride: 108 mmol/L (ref 101–111)
Chloride: 108 mmol/L (ref 101–111)
Creatinine, Ser: 0.52 mg/dL (ref 0.44–1.00)
Creatinine, Ser: 0.59 mg/dL (ref 0.44–1.00)
GFR calc Af Amer: >60 mL/min (ref >60)
GFR calc Af Amer: >60 mL/min (ref >60)
GFR calc non Af Amer: >60 mL/min (ref >60)
GFR calc non Af Amer: >60 mL/min (ref >60)
Glucose, Bld: 110 mg/dL — ABNORMAL HIGH (ref 65–99)
Glucose, Bld: 104 mg/dL — ABNORMAL HIGH (ref 65–99)
Potassium: 3.4 mmol/L — ABNORMAL LOW (ref 3.5–5.1)
Potassium: 3.5 mmol/L (ref 3.5–5.1)
Sodium: 134 mmol/L — ABNORMAL LOW (ref 135–145)
Sodium: 136 mmol/L (ref 135–145)

## 2018-03-29 LAB — HEPARIN LEVEL (UNFRACTIONATED): Heparin Unfractionated: 0.6 IU/mL (ref 0.30–0.70)

## 2018-03-29 MED ORDER — POTASSIUM CHLORIDE 20 MEQ/15ML (10%) PO SOLN: 40.0000 meq | Freq: Three times a day (TID) | ORAL | Status: DC

## 2018-03-29 MED ORDER — POTASSIUM CHLORIDE CRYS ER 20 MEQ PO TBCR
20.0000 meq | EXTENDED_RELEASE_TABLET | Freq: Two times a day (BID) | ORAL | Status: DC
  Administered 2018-03-29 – 2018-03-30 (×2): 20 meq via ORAL
  Filled 2018-03-29 (×2): qty 1

## 2018-03-29 MED ORDER — POTASSIUM CHLORIDE 10 MEQ/100ML IV SOLN
10.0000 meq | INTRAVENOUS | Status: AC
  Administered 2018-03-29: 10 meq via INTRAVENOUS
  Filled 2018-03-29 (×4): qty 100

## 2018-03-29 MED ORDER — POTASSIUM CHLORIDE 10 MEQ/100ML IV SOLN
10.0000 meq | INTRAVENOUS | Status: AC
  Administered 2018-03-29 (×2): 10 meq via INTRAVENOUS
  Filled 2018-03-29 (×3): qty 100

## 2018-03-29 MED ORDER — POTASSIUM CHLORIDE 20 MEQ/15ML (10%) PO SOLN
20.0000 meq | Freq: Two times a day (BID) | ORAL | Status: DC
  Administered 2018-03-29 (×2): 20 meq via ORAL
  Filled 2018-03-29 (×2): qty 15

## 2018-03-29 MED ORDER — BOOST / RESOURCE BREEZE PO LIQD CUSTOM
1.0000 | Freq: Three times a day (TID) | ORAL | Status: DC
  Administered 2018-03-29 – 2018-04-06 (×5): 1 via ORAL

## 2018-03-29 NOTE — Progress Notes (Signed)
Initial Nutrition Assessment  DOCUMENTATION CODES:   Obesity unspecified  INTERVENTION:  Provide Boost Breeze po TID, each supplement provides 250 kcal and 9 grams of protein.  NUTRITION DIAGNOSIS:   Inadequate oral intake related to altered GI function as evidenced by (post op ileus, liquid diet).  GOAL:   Patient will meet greater than or equal to 90% of their needs  MONITOR:   PO intake, Diet advancement, Weight trends, Supplement acceptance, Labs, Skin, I & O's  REASON FOR ASSESSMENT:   Low Braden    ASSESSMENT:   62 year old female with history of hypertension, gait disorder with falls weakness and decreased sensation left lower extremity greater than right lower extremity since October 2018. Work-up revealed T7-T8 mass with marked cord compression and large enhancing tail emanating from dural based mass extending around the cord.  She did sustain a fall prior to her evaluation and required ORIF left wrist on 03/04/2018 by Dr. Caralyn Guile.   She was evaluated by [facility] and underwent T7-T8 tumor resection   Pt with post op ileus/neurogenic bowel. KUB thi AM with some improvement. NGT removed this AM. Abdomen distended. Diet has been advanced to a clear liquid diet. Pt able to consume an [place] ice with no difficulties. Pt reports eating fine PTA with no other difficulties. Noted no new recent weight measurement recorded. Recommend obtaining new weight to full assess weight trends. RD to order Boost Breeze to aid in caloric and protein needs as pt on liquid diet.   Labs and medications reviewed.   NUTRITION - FOCUSED PHYSICAL EXAM:    Most Recent Value  Orbital Region  No depletion  Upper Arm Region  No depletion  Thoracic and Lumbar Region  No depletion  Buccal Region  No depletion  Temple Region  No depletion  Clavicle Bone Region  No depletion  Clavicle and Acromion Bone Region  No depletion  Scapular Bone Region  No depletion  Dorsal Hand  No  depletion  Patellar Region  No depletion  Anterior Thigh Region  No depletion  Posterior Calf Region  No depletion  Edema (RD Assessment)  Mild  Hair  Reviewed  Eyes  Reviewed  Mouth  Reviewed  Skin  Reviewed  Nails  Reviewed       Diet Order:   Diet Order           Diet clear liquid Room service appropriate? Yes; Fluid consistency: Thin  Diet effective now          EDUCATION NEEDS:   Not appropriate for education at this time  Skin:  Skin Assessment: Skin Integrity Issues: Skin Integrity Issues:: Incisions Incisions: back  Last BM:  5/15  Height:   Ht Readings from Last 1 Encounters:  03/21/18 5\' 4"  (1.626 m)    Weight:   Wt Readings from Last 1 Encounters:  03/21/18 224 lb 3.3 oz (101.7 kg)    Ideal Body Weight:  54.5 kg  BMI:  Body mass index is 38.49 kg/m.  Estimated Nutritional Needs:   Kcal:  1850-2000  Protein:  85-100 grams  Fluid:  1.8 - 2 L/day    Corrin Parker, MS, RD, LDN Pager # [phone] After hours/ weekend pager # [phone]

## 2018-03-29 NOTE — Progress Notes (Signed)
Patient ID: Hannah Jefferson, female   DOB: 1956-03-27, 62 y.o.   MRN: [id number]       Subjective: Patient feels well with NGT clamped.  Still having flatus and stools.  Feels slightly less bloated today.  Objective: Vital signs in last 24 hours: Temp:  [97.7 F (36.5 C)] 97.7 F (36.5 C) (05/15 0416) Pulse Rate:  [79-92] 79 (05/15 0416) Resp:  [17-20] 17 (05/15 0416) BP: (142-144)/(87-93) 144/87 (05/15 0416) SpO2:  [98 %-99 %] 98 % (05/15 0416) Last BM Date: 03/29/18  Intake/Output from previous day: 05/14 0701 - 05/15 0700 In: 854.4 [I.V.:554.4; IV Piggyback:300] Out: 950 [Urine:950] Intake/Output this shift: No intake/output data recorded.  PE: Abd: soft, still distended, some BS, NT  Lab Results:  Recent Labs    03/28/18 0543 03/29/18 0459  WBC 9.1 6.6  HGB 14.3 13.8  HCT 42.2 41.5  PLT 266 243   BMET Recent Labs    03/28/18 1640 03/29/18 0459  NA 134* 134*  K 3.5 3.4*  CL 105 108  CO2 20* 18*  GLUCOSE 115* 110*  BUN 11 9  CREATININE 0.55 0.52  CALCIUM 9.3 8.9   PT/INR No results for input(s): LABPROT, INR in the last 72 hours. CMP     Component Value Date/Time   NA 134 (L) 03/29/2018 0459   K 3.4 (L) 03/29/2018 0459   CL 108 03/29/2018 0459   CO2 18 (L) 03/29/2018 0459   GLUCOSE 110 (H) 03/29/2018 0459   BUN 9 03/29/2018 0459   CREATININE 0.52 03/29/2018 0459   CALCIUM 8.9 03/29/2018 0459   PROT 4.6 (L) 03/22/2018 0549   ALBUMIN 2.6 (L) 03/22/2018 0549   AST 29 03/22/2018 0549   ALT 52 03/22/2018 0549   ALKPHOS 45 03/22/2018 0549   BILITOT 0.9 03/22/2018 0549   GFRNONAA >60 03/29/2018 0459   GFRAA >60 03/29/2018 0459   Lipase  No results found for: LIPASE     Studies/Results: Ct Abdomen Pelvis Wo Contrast  Result Date: 03/27/2018 CLINICAL DATA:  Chronic ileus postop back surgery. Abdominal discomfort and thoracic spasms. Moderate abdominal distension EXAM: CT ABDOMEN AND PELVIS WITHOUT CONTRAST TECHNIQUE: Multidetector CT imaging  of the abdomen and pelvis was performed following the standard protocol without IV contrast. COMPARISON:  Plain film of the abdomen from earlier same day. FINDINGS: Lower chest: No acute abnormality.  Small hiatal hernia. Hepatobiliary: No focal liver abnormality is seen. Status post cholecystectomy. No biliary dilatation. Pancreas: Unremarkable. No pancreatic ductal dilatation or surrounding inflammatory changes. Spleen: Normal in size without focal abnormality. Adrenals/Urinary Tract: Adrenal glands appear normal. Kidneys are unremarkable without mass, stone or hydronephrosis. No ureteral or bladder calculi identified. Bladder is unremarkable. Small amount of air within the bladder is presumably related to recent catheterization. Stomach/Bowel: Fluid and gas throughout the colon, mildly to moderately distended throughout. Distal small bowel is upper normal in caliber, oral contrast present to the level of the ileum. Perhaps a small amount of oral contrast layering within the cecum. Enteric tube in place with tip adequately positioned at the level of the proximal duodenum. Vascular/Lymphatic: No significant vascular findings are present. No enlarged abdominal or pelvic lymph nodes. Reproductive: Uterus and bilateral adnexa are unremarkable. Other: No free fluid or abscess collection. No free intraperitoneal air. Musculoskeletal: No acute appearing osseous abnormality. Moderate scoliosis of the thoracolumbar spine. Chronic bilateral pars interarticularis defects at L5-S1 with grade 1/2 spondylolisthesis of L5. IMPRESSION: 1. Findings are most compatible with postoperative ileus. No evidence of a  mechanical bowel obstruction identified. Recommend follow-up plain film examinations to ensure resolution, and to visualize the eventual passage of the oral contrast to the large bowel (there is likely a small amount of layering oral contrast within the cecum). Enteric tube adequately positioned with tip at the level of the  duodenal bulb. 2. Small hiatal hernia. Electronically Signed   By: Franki Cabot M.D.   On: 03/27/2018 19:29   Dg Abd 1 View  Result Date: 03/28/2018 CLINICAL DATA:  Ileus, abdominal distention EXAM: ABDOMEN - 1 VIEW COMPARISON:  03/27/2018 FINDINGS: NG tube tip in the distal stomach or proximal duodenum. Prior cholecystectomy. Diffuse gaseous distention of bowel is stable compatible with ileus. No free air or organomegaly. IMPRESSION: Stable diffuse gaseous distention of bowel most compatible with ileus. Electronically Signed   By: Rolm Baptise M.D.   On: 03/28/2018 08:59   Ir Picc Placement Right >5 Yrs Inc Img Guide  Result Date: 03/28/2018 INDICATION: Right femoral DVT, thoracic myelopathy, poor peripheral access EXAM: ULTRASOUND AND FLUOROSCOPIC GUIDED PICC LINE INSERTION MEDICATIONS: 1% lidocaine local CONTRAST:  None FLUOROSCOPY TIME:  Twelve seconds (1 mGy) COMPLICATIONS: None immediate. TECHNIQUE: The procedure, risks, benefits, and alternatives were explained to the patient and informed written consent was obtained. A timeout was performed prior to the initiation of the procedure. The left upper extremity was prepped with chlorhexidine in a sterile fashion, and a sterile drape was applied covering the operative field. Maximum barrier sterile technique with sterile gowns and gloves were used for the procedure. A timeout was performed prior to the initiation of the procedure. Local anesthesia was provided with 1% lidocaine. Under direct ultrasound guidance, the patent left brachial vein was accessed with a micropuncture kit after the overlying soft tissues were anesthetized with 1% lidocaine. An ultrasound image was saved for documentation purposes. A guidewire was advanced to the level of the superior caval-atrial junction for measurement purposes and the PICC line was cut to length. A peel-away sheath was placed and a 46 cm, 5 [place], dual lumen was inserted to level of the superior caval-atrial  junction. A post procedure spot fluoroscopic was obtained. The catheter easily aspirated and flushed and was sutured in place. A dressing was placed. The patient tolerated the procedure well without immediate post procedural complication. FINDINGS: After catheter placement, the tip lies within the superior cavoatrial junction. The catheter aspirates and flushes normally and is ready for immediate use. IMPRESSION: Successful ultrasound and fluoroscopic guided placement of a left brachial vein approach, 46 cm, 5 French, dual lumen PICC with tip at the superior caval-atrial junction. The PICC line is ready for immediate use. Electronically Signed   By: Jerilynn Mages.  Shick M.D.   On: 03/28/2018 15:36    Anti-infectives: Anti-infectives (From admission, onward)   Start     Dose/Rate Route Frequency Ordered Stop   03/24/18 1000  cefTRIAXone (ROCEPHIN) 1 g in sodium chloride 0.9 % 100 mL IVPB     1 g 200 mL/hr over 30 Minutes Intravenous Every 24 hours 03/24/18 0855     03/23/18 1400  cephALEXin (KEFLEX) 250 MG/5ML suspension 500 mg  Status:  Discontinued     500 mg Oral Every 8 hours 03/23/18 1134 03/24/18 0855       Assessment/Plan  Thoracic myelopathy s/p T7-T8 tumor resection by Dr. Redmond Pulling at [facility] 03/13/18 Paraplegia Left wrist fracture s/p ORIF  Adynamic ileus of the small bowel and colon -DC NGT and adv to clear liquids today -cont mobilization to help with guy  Motility -K is 3.4.  This needs to be replaced to help with guy motility as well.   FEN - DC NGT, clear liquids VTE - heparin ID - Rocephin - UTI   LOS: 8 days    Henreitta Cea , [facility] 03/29/2018, 7:49 AM Pager: [phone]

## 2018-03-29 NOTE — Progress Notes (Signed)
Occupational Therapy Weekly Progress Note  Patient Details  Name: Hannah Jefferson MRN: [id number] Date of Birth: [birth year]  Beginning of progress report period: Mar 22, 2018 End of progress report period: Mar 29, 2018  Today's Date: 03/29/2018 OT Individual Time: 6720-9470 OT Individual Time Calculation (min): 60 min    Patient has met 1 of 4 short term goals.  Pt is progressing in all areas but has not fully met her goals. Pt had some medical complications and needed to be on bed rest, have a procedure for placement and removal of an NG tube so there have been interruptions in her ability to fully participate.  Pt missed 3 days of therapies so essentially she only was able to participate 5 out of 8 days.  She has improved with her sitting balance, ability to put weight through her legs and activity tolerance.    Patient continues to demonstrate the following deficits: muscle weakness, decreased cardiorespiratoy endurance, unbalanced muscle activation and decreased coordination and decreased sitting balance and decreased standing balance and therefore will continue to benefit from skilled OT intervention to enhance overall performance with BADL.  Patient progressing toward long term goals..  Continue plan of care.  OT Short Term Goals Week 1:  OT Short Term Goal 1 (Week 1): Pt will be able to sit to stand with max A of 1 to prepare for LB self care. OT Short Term Goal 1 - Progress (Week 1): Progressing toward goal OT Short Term Goal 2 (Week 1): Pt will be able to squat pivot to w/c with max A of 1.  OT Short Term Goal 2 - Progress (Week 1): Progressing toward goal OT Short Term Goal 3 (Week 1): Pt will Jefferson shirt with min A. OT Short Term Goal 3 - Progress (Week 1): Met OT Short Term Goal 4 (Week 1): Pt will use AE to Jefferson pants over feet with mod A. OT Short Term Goal 4 - Progress (Week 1): Progressing toward goal Week 2:  OT Short Term Goal 1 (Week 2): Pt will be able to sit to stand with  max A of 1 to prepare for LB self care. OT Short Term Goal 2 (Week 2): Pt will be able to squat pivot to w/c with max A of 1.  OT Short Term Goal 3 (Week 2): Pt will use AE to Jefferson pants over feet with mod A.  Skilled Therapeutic Interventions/Progress Updates:    Pt seen this session for therapy with a focus on balance, transfers and sit to stand. Pt received in w/c with c/o discomfort from the head rest in [place] chair.  Spent several minutes trying to adjust head rest and it would not go back far enough so eventually decided to remove it completely. When pt is tilted back a pillow can be used to support her head.  Pt placed at sink for bathing with min A for lower legs (bottom and perineal area bathed earlier).  Pt could only Jefferson a gown due to continuous IVs.   Pt worked on sit to stand at sink with support to L leg to maintain leg in neutral and support to knee. Max A of 2 (supported under arms) to rise to sink with pt using R arm. Pt able to push through legs and stood for 30 seconds.  Pt requested to return to bed to rest.  Used bed pad on slide board as pt did not have pants on.  Focused on scoots vs slide on board  to work towards a squat pivot.  Max A with scooting with cues for forward lean and using R arm for support. Pt adjusted in bed with max A. Pt in bed with sister in room and all needs met.  Therapy Documentation Precautions:  Precautions Precautions: Fall, Back Precaution Comments: LUE NWB in splint Restrictions Weight Bearing Restrictions: Yes LUE Weight Bearing: Non weight bearing  Pain: Pain Assessment Pain Scale: 0-10 Pain Score: 0-No pain ADL: ADL ADL Comments: refer to functional navigator  See Function Navigator for Current Functional Status.   Therapy/Group: Individual Therapy  [place] 03/29/2018, 1:07 PM

## 2018-03-29 NOTE — Progress Notes (Signed)
Social Work Patient ID: Hannah Jefferson, female   DOB: 22-Feb-1956, 62 y.o.   MRN: [id number]   Met with pt and sister-in-law this afternoon to review team conference.  Both very pleased with therapies today and pt feels "like I'm finally starting to get somewhere."  Pt remains very optimistic overall and family very encouraging.  They are aware and agreeable with targeted d/c date of 6/4 and min assist goals.  Will continue to follow.  Christoph Copelan, LCSW

## 2018-03-29 NOTE — Progress Notes (Signed)
Pt feels subjectively much better.  KUB this morning slightly improved.  On exam, abd appears to me to be more distended, but is not significantly tympanitic.  K+ level a bit lower at 3.4--NG is out, oral supplementation is ordered.  IMPR:  Stable to perhaps slightly improved colonic ileus.  RECOMM:  Pt may need more than 40 mEq of KCl per day to replete deficits and get her level up into the 4.5 range.   Cleotis Nipper, M.D. Pager [phone] If no answer or after 5 PM call [phone]

## 2018-03-29 NOTE — Progress Notes (Signed)
Physical Therapy Session Note  Patient Details  Name: Hannah Jefferson MRN: [id number] Date of Birth: [birth year]  Today's Date: 03/29/2018 PT Individual Time: 1600-1700 PT Individual Time Calculation (min): 60 min   Short Term Goals: Week 1:  PT Short Term Goal 1 (Week 1): Pt will verbalize 2 methods of pressure relief in w/c with min cues PT Short Term Goal 2 (Week 1): Pt will tolerate supported standing x1 minute for decreased burden of care with ADLs PT Short Term Goal 3 (Week 1): Pt will initiate w/c propulsion for UE strengthening and cardiovascular endurance PT Short Term Goal 4 (Week 1): Pt will utilize LEs and RUE to assist with bed mobility in 100% of opportunities with min cues  Skilled Therapeutic Interventions/Progress Updates:    Pt supine in bed, agreeable to PT. No complaints of pain. Pt requests to use the bathroom. Supine to sit mod A with v/c for log-rolling technique and use of bedrails. Sit to stand max A to stedy with bed elevated. Stedy transfer bed to elevated commode over toilet. Sit to stand from toilet with total A x 2 to stedy. Pt is dependent for pericare and clothing management and tolerates standing x 2 min at most during toileting tasks. Sit to stand with assist x 2 from stedy seat due to onset of pt fatigue. Stedy transfer back to bed. Sit to supine total assist for LE management and trunk control. Pt left semi-reclined in bed with needs in reach, BPRAFO boots in place, family present. Pt's sister present during therapy session and assists with toileting tasks.  Therapy Documentation Precautions:  Precautions Precautions: Fall, Back Precaution Comments: LUE NWB in splint Restrictions Weight Bearing Restrictions: Yes LUE Weight Bearing: Non weight bearing  See Function Navigator for Current Functional Status.   Therapy/Group: Individual Therapy  Excell Seltzer, PT, DPT  03/29/2018, 5:26 PM

## 2018-03-29 NOTE — Progress Notes (Signed)
Physical Therapy Session Note  Patient Details  Name: Hannah Jefferson MRN: [id number] Date of Birth: 16-Nov-1955  Today's Date: 03/29/2018 PT Individual Time: 0800-0900 PT Individual Time Calculation (min): 60 min   Short Term Goals: Week 1:  PT Short Term Goal 1 (Week 1): Pt will verbalize 2 methods of pressure relief in w/c with min cues PT Short Term Goal 2 (Week 1): Pt will tolerate supported standing x1 minute for decreased burden of care with ADLs PT Short Term Goal 3 (Week 1): Pt will initiate w/c propulsion for UE strengthening and cardiovascular endurance PT Short Term Goal 4 (Week 1): Pt will utilize LEs and RUE to assist with bed mobility in 100% of opportunities with min cues  Skilled Therapeutic Interventions/Progress Updates: Pt presented in bed with SIL present agreeable to therapy. Pt indicating need for urinary void, agreeable to attempt on toilet. Pt performed supine to sit with HOB elevated mod/maxA with assist for BLE placement and truncal support for log roll technique. PTA donned shoes and aircast total assist. Pt able to maintain fair seated balance for set up and use of Stedy. Performed sit to stand in stedy mon to maxA x 2 with manual facilitation of shifting hips forward. Pt transported to toilet with BSC placed over standard toilet (+void/BM). Pt performed sit to stand from toilet maxA x 2 and maintaining standing in Stedy approx 2 min to allow PTA to perform peri hygiene. Pt returned to [place] and practiced scooting back in [place]. Pt able to initiate and scoot partially back but requiring total assist for full scoot back in chair and repositioning. Pt brushed teeth and sink and left in TIS with call bell within reach and needs met.      Therapy Documentation Precautions:  Precautions Precautions: Fall, Back Precaution Comments: LUE NWB in splint Restrictions Weight Bearing Restrictions: Yes LUE Weight Bearing: Non weight bearing General:   Vital Signs: Therapy  Vitals Temp: 97.7 F (36.5 C) Temp Source: Oral Pulse Rate: 95 Resp: 17 BP: (!) 127/92 Patient Position (if appropriate): Sitting Oxygen Therapy SpO2: 97 % O2 Device: Room Air Pain:     See Function Navigator for Current Functional Status.   Therapy/Group: Individual Therapy  Judah Carchi  Mily Malecki, PTA  03/29/2018, 3:52 PM

## 2018-03-29 NOTE — Progress Notes (Signed)
Physical Therapy Note  Patient Details  Name: Hannah Jefferson MRN: [id number] Date of Birth: Feb 02, 1956 Today's Date: 03/29/2018  1300-1420, 80 min individual tx Pain: none per pt  Bed mobility for hygiene change. Rolling R after set-up in hook lying position, with RUE reaching and turning head L to facilitate activation of abdominals.   Pt sat EOB with close supervision.  Sit> stand in [place] for pulling pants up.  Stedy transfer to w/c, +2.  Pt is able to sit back further in her w/c from standing in the [place] if the lift legs are positioned in the narrow position, under w/c; PT added this to Safety Plan.  neuromuscular re-education via mulitmodal cues and positioning via strap around thighs, seated in wc for use of [place] at level 50 cm/sec x 20 cycles x 2, and bil hip adduction against towel roll x 25.  Sit> stand in //, with pt pulling up on R bar, +2.  Pt stood x 30 seconds, x 1 minute, with cues for r knee extension, upright trunk.  Pt left resting in w/c with alarm set, and all needs within reach. [name] with pt. PT urged pt to sit up in w/c another 20 minutes before getting back to bed, to address activity tolerance.  PT informed [name], RN that pt would call to get back in bed, and informed her of optimum use of [name] as above.  See function navigator for current status.   [name] 03/29/2018, 10:42 AM

## 2018-03-29 NOTE — Progress Notes (Addendum)
[facility] PHYSICAL MEDICINE & REHABILITATION     PROGRESS NOTE    Subjective/Complaints: Feeling better. Had numerous bm's yesterday and this morning. Having flatus  ROS: Patient denies fever, rash, sore throat, blurred vision, , cough, shortness of breath or chest pain,  headache, or mood change.   Objective: Vital Signs: Blood pressure (!) 144/87, pulse 79, temperature 97.7 F (36.5 C), temperature source Oral, resp. rate 17, height '5\' 4"'$  (1.626 m), weight 101.7 kg (224 lb 3.3 oz), SpO2 98 %. Ct Abdomen Pelvis Wo Contrast  Result Date: 03/27/2018 CLINICAL DATA:  Chronic ileus postop back surgery. Abdominal discomfort and thoracic spasms. Moderate abdominal distension EXAM: CT ABDOMEN AND PELVIS WITHOUT CONTRAST TECHNIQUE: Multidetector CT imaging of the abdomen and pelvis was performed following the standard protocol without IV contrast. COMPARISON:  Plain film of the abdomen from earlier same day. FINDINGS: Lower chest: No acute abnormality.  Small hiatal hernia. Hepatobiliary: No focal liver abnormality is seen. Status post cholecystectomy. No biliary dilatation. Pancreas: Unremarkable. No pancreatic ductal dilatation or surrounding inflammatory changes. Spleen: Normal in size without focal abnormality. Adrenals/Urinary Tract: Adrenal glands appear normal. Kidneys are unremarkable without mass, stone or hydronephrosis. No ureteral or bladder calculi identified. Bladder is unremarkable. Small amount of air within the bladder is presumably related to recent catheterization. Stomach/Bowel: Fluid and gas throughout the colon, mildly to moderately distended throughout. Distal small bowel is upper normal in caliber, oral contrast present to the level of the ileum. Perhaps a small amount of oral contrast layering within the cecum. Enteric tube in place with tip adequately positioned at the level of the proximal duodenum. Vascular/Lymphatic: No significant vascular findings are present. No enlarged  abdominal or pelvic lymph nodes. Reproductive: Uterus and bilateral adnexa are unremarkable. Other: No free fluid or abscess collection. No free intraperitoneal air. Musculoskeletal: No acute appearing osseous abnormality. Moderate scoliosis of the thoracolumbar spine. Chronic bilateral pars interarticularis defects at L5-S1 with grade 1/2 spondylolisthesis of L5. IMPRESSION: 1. Findings are most compatible with postoperative ileus. No evidence of a mechanical bowel obstruction identified. Recommend follow-up plain film examinations to ensure resolution, and to visualize the eventual passage of the oral contrast to the large bowel (there is likely a small amount of layering oral contrast within the cecum). Enteric tube adequately positioned with tip at the level of the duodenal bulb. 2. Small hiatal hernia. Electronically Signed   By: Franki Cabot M.D.   On: 03/27/2018 19:29   Dg Abd 1 View  Result Date: 03/28/2018 CLINICAL DATA:  Ileus, abdominal distention EXAM: ABDOMEN - 1 VIEW COMPARISON:  03/27/2018 FINDINGS: NG tube tip in the distal stomach or proximal duodenum. Prior cholecystectomy. Diffuse gaseous distention of bowel is stable compatible with ileus. No free air or organomegaly. IMPRESSION: Stable diffuse gaseous distention of bowel most compatible with ileus. Electronically Signed   By: Rolm Baptise M.D.   On: 03/28/2018 08:59   Ir Picc Placement Right >5 Yrs Inc Img Guide  Result Date: 03/28/2018 INDICATION: Right femoral DVT, thoracic myelopathy, poor peripheral access EXAM: ULTRASOUND AND FLUOROSCOPIC GUIDED PICC LINE INSERTION MEDICATIONS: 1% lidocaine local CONTRAST:  None FLUOROSCOPY TIME:  Twelve seconds (1 mGy) COMPLICATIONS: None immediate. TECHNIQUE: The procedure, risks, benefits, and alternatives were explained to the patient and informed written consent was obtained. A timeout was performed prior to the initiation of the procedure. The left upper extremity was prepped with  chlorhexidine in a sterile fashion, and a sterile drape was applied covering the operative field. Maximum  barrier sterile technique with sterile gowns and gloves were used for the procedure. A timeout was performed prior to the initiation of the procedure. Local anesthesia was provided with 1% lidocaine. Under direct ultrasound guidance, the patent left brachial vein was accessed with a micropuncture kit after the overlying soft tissues were anesthetized with 1% lidocaine. An ultrasound image was saved for documentation purposes. A guidewire was advanced to the level of the superior caval-atrial junction for measurement purposes and the PICC line was cut to length. A peel-away sheath was placed and a 46 cm, 5 [place], dual lumen was inserted to level of the superior caval-atrial junction. A post procedure spot fluoroscopic was obtained. The catheter easily aspirated and flushed and was sutured in place. A dressing was placed. The patient tolerated the procedure well without immediate post procedural complication. FINDINGS: After catheter placement, the tip lies within the superior cavoatrial junction. The catheter aspirates and flushes normally and is ready for immediate use. IMPRESSION: Successful ultrasound and fluoroscopic guided placement of a left brachial vein approach, 46 cm, 5 French, dual lumen PICC with tip at the superior caval-atrial junction. The PICC line is ready for immediate use. Electronically Signed   By: Jerilynn Mages.  Shick M.D.   On: 03/28/2018 15:36   Recent Labs    03/28/18 0543 03/29/18 0459  WBC 9.1 6.6  HGB 14.3 13.8  HCT 42.2 41.5  PLT 266 243   Recent Labs    03/28/18 1640 03/29/18 0459  NA 134* 134*  K 3.5 3.4*  CL 105 108  GLUCOSE 115* 110*  BUN 11 9  CREATININE 0.55 0.52  CALCIUM 9.3 8.9   CBG (last 3)  No results for input(s): GLUCAP in the last 72 hours.  Wt Readings from Last 3 Encounters:  03/21/18 101.7 kg (224 lb 3.3 oz)  03/20/18 93.4 kg (206 lb)  02/15/18 94.1  kg (207 lb 8 oz)    Physical Exam:  Constitutional: No distress . Vital signs reviewed. HEENT: EOMI, oral membranes moist Neck: supple Cardiovascular: RRR without murmur. No JVD    Respiratory: CTA Bilaterally without wheezes or rales. Normal effort    GI: scarce bowel sounds. Still distended but less  Musculoskeletal: She exhibits noedema. Left arm in [facility]. Full movement of fingers.   Neurological: Motor RUE 5/5. LUE limited by splint can lift arm and wiggle all fingers. Decreased sensation to LT and proprioception, left greater than right brelow T8 dermatome. LE motor 2+ to 3/5 HF, KE and 3/5---motor/sensory stable.  ADF/PF. DTR's 1+. No resting tone.motor stable.  Skin: Back incision CDI. Midline RUE, PICC LUE Psychiatric:  Remains in good spirits.    Assessment/Plan: 1. Paraplegia and functional deficits secondary to T8 myelopathy, left wrist fracture which require 3+ hours per day of interdisciplinary therapy in a comprehensive inpatient rehab setting. Physiatrist is providing close team supervision and 24 hour management of active medical problems listed below. Physiatrist and rehab team continue to assess barriers to discharge/monitor patient progress toward functional and medical goals.  Function:  Bathing Bathing position Bathing activity did not occur: N/A Position: (sitting on BSC)  Bathing parts Body parts bathed by patient: Right arm, Left arm, Chest, Abdomen, Right upper leg, Left upper leg Body parts bathed by helper: Front perineal area, Buttocks, Back  Bathing assist Assist Level: Assistive device, 2 helpers Assistive Device Comment: reacher    Upper Body Dressing/Undressing Upper body dressing   What is the patient wearing?: Button up shirt Bra - Perfomed by patient: Thread/unthread  left bra strap, Thread/unthread right bra strap Bra - Perfomed by helper: Hook/unhook bra (pull down sports bra) Pull over shirt/dress - Perfomed by patient: Thread/unthread  right sleeve, Thread/unthread left sleeve, Put head through opening Pull over shirt/dress - Perfomed by helper: Put head through opening, Pull shirt over trunk Button up shirt - Perfomed by patient: Thread/unthread right sleeve, Thread/unthread left sleeve, Button/unbutton shirt Button up shirt - Perfomed by helper: Pull shirt around back    Upper body assist Assist Level: Touching or steadying assistance(Pt > 75%)      Lower Body Dressing/Undressing Lower body dressing Lower body dressing/undressing activity did not occur: N/A What is the patient wearing?: Pants, Non-skid slipper socks       Pants- Performed by helper: Thread/unthread right pants leg, Thread/unthread left pants leg, Pull pants up/down   Non-skid slipper socks- Performed by helper: Don/doff right sock, Don/doff left sock                  Lower body assist Assist for lower body dressing: 2 Helpers      Toileting Toileting     Toileting steps completed by helper: Adjust clothing prior to toileting, Performs perineal hygiene, Adjust clothing after toileting    Toileting assist Assist level: Two helpers   Transfers Chair/bed transfer   Chair/bed transfer method: Other Chair/bed transfer assist level: Total assist (Pt < 25%) Chair/bed transfer assistive device: Mechanical lift(STEDY) Mechanical lift: Stedy   Locomotion Ambulation Ambulation activity did not occur: Safety/medical concerns         Wheelchair   Type: Manual Max wheelchair distance: 150 Assist Level: Touching or steadying assistance (Pt > 75%)  Cognition Comprehension Comprehension assist level: Follows complex conversation/direction with no assist  Expression Expression assist level: Expresses complex ideas: With no assist  Social Interaction Social Interaction assist level: Interacts appropriately with others - No medications needed.  Problem Solving Problem solving assist level: Solves complex problems: Recognizes & self-corrects   Memory Memory assist level: Complete Independence: No helper   Medical Problem List and Plan: 1.Paraplegia and funtional deficitssecondary to T8 meningioma with myeopathy -recent left wrist fracture -continue PT, OT 2. DVT  Right common femoral vein (mobile), right posterior tibial and peroneal vein thrombi:  -continue IV heparin until ileus improved further  -OOB today.  3. Pain Management:Hydrocodone prn 4. Mood:LCSW to follow for evaluation and support. 5. Neuropsych: This patientiscapable of making decisions on herown behalf. 6. Skin/Wound Care:Routine pressure relief measures. 7. Fluids/Electrolytes/Nutrition:Monitor I/O.    -potassium 3.4 today  -continue k+ in IVF   -begin oral k+ supplement in addition to IV supp  -recheck labs this afternoon. 8. Post op ileus/neurogenic bowel:  -moving bowels, having flatus  -KUB showing gradual improvement  - spoke with surgery---dc NGT, start liquid diet  -continue IV reglan 9.Neurogenic bladder:foley out today, voiding trial/caths  -E coli UTI--continue rocephin q24 10. GERD:managed withPPIbid. 11. Hyponatremia: 134 today  -hctz stopped  -continue D5NS IV  12.Leftwrist fracture: NWB. [place]   LOS (Days) [place] EVALUATION WAS PERFORMED  Meredith Staggers, MD 03/29/2018 8:55 AM

## 2018-03-29 NOTE — Progress Notes (Signed)
ANTICOAGULATION CONSULT NOTE - Follow up [facility] for heparin Indication: acute mobile DVT  No Known Allergies  Patient Measurements: Height: 5\' 4"  (162.6 cm) Weight: 224 lb 3.3 oz (101.7 kg) IBW/kg (Calculated) : 54.7 Heparin Dosing Weight: 78 kg  Vital Signs: Temp: 97.7 F (36.5 C) (05/15 0416) Temp Source: Oral (05/15 0416) BP: 144/87 (05/15 0416) Pulse Rate: 79 (05/15 0416)  Labs: Recent Labs    03/27/18 0233  03/28/18 0543 03/28/18 1243 03/28/18 1640 03/29/18 0459  HGB 13.1  --  14.3  --   --  13.8  HCT 39.2  --  42.2  --   --  41.5  PLT 227  --  266  --   --  243  HEPARINUNFRC 0.50  --  0.22* 0.40  --  0.60  CREATININE 0.54   < > 0.55  --  0.55 0.52   < > = values in this interval not displayed.    Estimated Creatinine Clearance: 87.9 mL/min (by C-G formula based on SCr of 0.52 mg/dL).   Medical History: Past Medical History:  Diagnosis Date  . Chronic left hip pain   . GERD (gastroesophageal reflux disease)   . Gestational diabetes   . Hearing loss of both ears   . Hypertension   . Neuropathy    numbness/tingling from waist down  . Vision abnormalities     Medications:  Scheduled:  . lisinopril  20 mg Oral Daily  . methocarbamol  500 mg Oral BID  . metoCLOPramide (REGLAN) injection  10 mg Intravenous Q6H  . vitamin B-6  200 mg Oral Daily  . sodium chloride flush  10-40 mL Intracatheter Q12H  . tamsulosin  0.4 mg Oral QPC supper  . vitamin C  500 mg Oral BID   Infusions:  . cefTRIAXone (ROCEPHIN)  IV Stopped (03/28/18 1004)  . dextrose 5 % and 0.9 % NaCl with KCl 40 mEq/L 75 mL/hr at 03/28/18 2148  . famotidine (PEPCID) IV Stopped (03/28/18 2034)  . heparin 1,050 Units/hr (03/29/18 0318)  . potassium chloride      Assessment: Hannah Jefferson admitted with thoracic myelopathy s/p T7-T8 tumor resection, currently NPO due to persistent ileus and cecum, found with acute DVT now on IV heparin in case of eventual GI procedure. Heparin  was held for IR PICC insertion 5/14 and was restarted at 1630.  Plan to attempt to advance diet to clear liquids today.  Heparin level remains therapeutic this morning at 0.6. No bleeding noted, CBC stable.  Goal of Therapy:  Heparin level 0.3-0.7 units/ml Monitor platelets by anticoagulation protocol: Yes   Plan:  Continue heparin gtt at 1050 units/hr Confirmatory heparin level in 6 hours Daily heparin level and CBC  Monitor s/sx of bleeding   Thank you for allowing [place] to participate in this patients care.  Jens Som, PharmD Clinical phone for 03/29/2018 from 7a-3:30p: [phone] If after 3:30p, please call main pharmacy at: [phone] 03/29/2018 8:22 AM

## 2018-03-29 NOTE — Patient Care Conference (Signed)
Inpatient RehabilitationTeam Conference and Plan of Care Update Date: 03/28/2018   Time: 2:15 PM    Patient Name: Hannah Jefferson      Medical Record Number: [id number]  Date of Birth: September 24, 1956 Sex: Female         Room/Bed: 4W11C/4W11C-01 Payor Info: Payor: Theme park manager / Plan: Theme park manager OTHER / Product Type: *No Product type* /    Admitting Diagnosis: T7 8 MENOGIONE  RESECTION  Admit Date/Time:  03/21/2018  1:32 PM Admission Comments: No comment available   Primary Diagnosis:  Thoracic myelopathy Principal Problem: Thoracic myelopathy  Patient Active Problem List   Diagnosis Date Noted  . Thoracic myelopathy 03/21/2018  . Neurogenic bowel 03/21/2018  . Neurogenic bladder 03/21/2018  . Left wrist fracture, sequela 03/21/2018  . Numbness 02/15/2018  . Ataxic gait 02/15/2018  . Left leg weakness 02/15/2018  . Other fatigue 02/15/2018  . Urinary incontinence 02/15/2018  . Bowel incontinence 02/15/2018    Expected Discharge Date: Expected Discharge Date: 04/18/18  Team Members Present: Physician leading conference: Dr. Alger Simons Social Worker Present: Lennart Pall, LCSW Nurse Present: Dorien Chihuahua, RN PT Present: Canary Brim, PT;Caitlin Penven-Crew, PT OT Present: Napoleon Form, OT PPS Coordinator present : Ileana Ladd, PT     Current Status/Progress Goal Weekly Team Focus  Medical   T8 myelopathy due to meningioma s/p resection. post-op severe ileus and right CFV dvt.   treat DVT ileus  dvt, ileus   Bowel/Bladder   incont b/b; PVRs; lbm 5/14; ileus  bowel program; bedpan  assess q shift and prn   Swallow/Nutrition/ Hydration             ADL's   Bed level total A due to medical instability this week  min A overall with LB self care and transfers, s/u UB self care  Activity tolerance, functional mobility, sit<> stand with lift ADL re-training   Mobility   resumed therapies 5/14 after being on bedrest several days; mod to max assist bed mobility; mod to  max +2 with stedy for transfers  min assist w/c level transfers; mod assist controlled environment gait goals; mod I w/c mobility  increasing OOB tolerance/endurance now that off of bedrest, functional transfers, strengthening, NMR, SCI education   Communication             Safety/Cognition/ Behavioral Observations            Pain   no c/o pain  <3  assess q shift and prn   Skin   see chart  free from further breakdown and from infection  assess q shift and prn    Rehab Goals Patient on target to meet rehab goals: Yes *See Care Plan and progress notes for long and short-term goals.     Barriers to Discharge  Current Status/Progress Possible Resolutions Date Resolved   Physician    Medical stability        see chart      Nursing                  PT                    OT                  SLP                SW                Discharge Planning/Teaching Needs:  Pt  to d/c home with spouse and sister-in-law providing 24/7 assistance.  Sister-in-law present for some sessions - teaching continues.   Team Discussion:  Unfortunately ileus and DVT worsened right at time of CIR which has affected what pt can do with therapies.  Today she was allowed OOB;  Picc placed finally.  Has sensory deficits due to thoracic injury and still req canth q 6hrs.  Max assist +2 overall with PT/OT and hoping for min assist goals.  Gait goals TBD.  Revisions to Treatment Plan:  None    Continued Need for Acute Rehabilitation Level of Care: The patient requires daily medical management by a physician with specialized training in physical medicine and rehabilitation for the following conditions: Daily direction of a multidisciplinary physical rehabilitation program to ensure safe treatment while eliciting the highest outcome that is of practical value to the patient.: Yes Daily medical management of patient stability for increased activity during participation in an intensive rehabilitation regime.:  Yes Daily analysis of laboratory values and/or radiology reports with any subsequent need for medication adjustment of medical intervention for : Post surgical problems;Other  Jahaziel Francois 03/29/2018, 4:42 PM

## 2018-03-30 ENCOUNTER — Inpatient Hospital Stay (HOSPITAL_COMMUNITY): Payer: 59 | Admitting: Physical Therapy

## 2018-03-30 ENCOUNTER — Inpatient Hospital Stay (HOSPITAL_COMMUNITY): Payer: 59

## 2018-03-30 ENCOUNTER — Inpatient Hospital Stay (HOSPITAL_COMMUNITY): Payer: 59 | Admitting: Occupational Therapy

## 2018-03-30 LAB — BASIC METABOLIC PANEL
Anion gap: 8 (ref 5–15)
Anion gap: 5 (ref 5–15)
BUN: 6 mg/dL (ref 6–20)
BUN: <5 mg/dL — ABNORMAL LOW (ref 6–20)
CO2: 19 mmol/L — ABNORMAL LOW (ref 22–32)
CO2: 20 mmol/L — ABNORMAL LOW (ref 22–32)
Calcium: 8.9 mg/dL (ref 8.9–10.3)
Calcium: 9.1 mg/dL (ref 8.9–10.3)
Chloride: 107 mmol/L (ref 101–111)
Chloride: 110 mmol/L (ref 101–111)
Creatinine, Ser: 0.53 mg/dL (ref 0.44–1.00)
Creatinine, Ser: 0.5 mg/dL (ref 0.44–1.00)
GFR calc Af Amer: >60 mL/min (ref >60)
GFR calc Af Amer: >60 mL/min (ref >60)
GFR calc non Af Amer: >60 mL/min (ref >60)
GFR calc non Af Amer: >60 mL/min (ref >60)
Glucose, Bld: 109 mg/dL — ABNORMAL HIGH (ref 65–99)
Glucose, Bld: 127 mg/dL — ABNORMAL HIGH (ref 65–99)
Potassium: 3.4 mmol/L — ABNORMAL LOW (ref 3.5–5.1)
Potassium: 3.6 mmol/L (ref 3.5–5.1)
Sodium: 134 mmol/L — ABNORMAL LOW (ref 135–145)
Sodium: 135 mmol/L (ref 135–145)

## 2018-03-30 LAB — CBC
HCT: 38.7 % (ref 36.0–46.0)
Hemoglobin: 12.9 g/dL (ref 12.0–15.0)
MCH: 29.1 pg (ref 26.0–34.0)
MCHC: 33.3 g/dL (ref 30.0–36.0)
MCV: 87.4 fL (ref 78.0–100.0)
Platelets: 221 10*3/uL (ref 150–400)
RBC: 4.43 MIL/uL (ref 3.87–5.11)
RDW: 14.2 % (ref 11.5–15.5)
WBC: 5.8 10*3/uL (ref 4.0–10.5)

## 2018-03-30 LAB — HEPARIN LEVEL (UNFRACTIONATED): Heparin Unfractionated: 0.57 IU/mL (ref 0.30–0.70)

## 2018-03-30 MED ORDER — GENERIC EXTERNAL MEDICATION
Status: DC
Start: ? — End: 2018-03-30

## 2018-03-30 MED ORDER — FAMOTIDINE 20 MG PO TABS
20.0000 mg | ORAL_TABLET | Freq: Two times a day (BID) | ORAL | Status: DC
Start: 2018-03-30 — End: 2018-03-31
  Administered 2018-03-30 (×2): 20 mg via ORAL
  Filled 2018-03-30 (×2): qty 1

## 2018-03-30 MED ORDER — SIMETHICONE 80 MG PO CHEW
80.0000 mg | CHEWABLE_TABLET | Freq: Four times a day (QID) | ORAL | Status: DC | PRN
  Administered 2018-03-30 – 2018-04-09 (×3): 80 mg via ORAL
  Filled 2018-03-30 (×4): qty 1

## 2018-03-30 MED ORDER — POTASSIUM CHLORIDE CRYS ER 20 MEQ PO TBCR
40.0000 meq | EXTENDED_RELEASE_TABLET | Freq: Two times a day (BID) | ORAL | Status: DC
  Administered 2018-03-30 – 2018-04-05 (×2): 40 meq via ORAL
  Filled 2018-03-30 (×5): qty 2

## 2018-03-30 MED ORDER — GERHARDT'S BUTT CREAM
TOPICAL_CREAM | Freq: Three times a day (TID) | CUTANEOUS | Status: DC | PRN
  Administered 2018-03-30 – 2018-04-23 (×8): via TOPICAL
  Filled 2018-03-30 (×4): qty 1

## 2018-03-30 MED ORDER — CALCIUM CARBONATE ANTACID 500 MG PO CHEW
1.0000 | CHEWABLE_TABLET | Freq: Three times a day (TID) | ORAL | Status: DC
  Administered 2018-03-30 – 2018-04-28 (×73): 200 mg via ORAL
  Filled 2018-03-30 (×75): qty 1

## 2018-03-30 MED ORDER — POTASSIUM CHLORIDE 10 MEQ/100ML IV SOLN
10.0000 meq | INTRAVENOUS | Status: AC
  Administered 2018-03-30 (×3): 10 meq via INTRAVENOUS
  Filled 2018-03-30 (×3): qty 100

## 2018-03-30 NOTE — Progress Notes (Signed)
Feels better. K+ remains a little low despite KCl 87mEq daily--I expect this will improve. On exam, no overt distension and no tympany.  No new suggestions.   Suspect that pt might be able to be advanced to solid food tomorrow if still doing well.  May also be able to stop Reglan in a day or two.  Cleotis Nipper, M.D. Pager [phone] If no answer or after 5 PM call [phone]

## 2018-03-30 NOTE — Progress Notes (Signed)
ANTICOAGULATION CONSULT NOTE - Follow up [facility] for heparin Indication: acute mobile DVT  No Known Allergies  Patient Measurements: Height: 5\' 4"  (162.6 cm) Weight: 224 lb 3.3 oz (101.7 kg) IBW/kg (Calculated) : 54.7 Heparin Dosing Weight: 78 kg  Vital Signs: Temp: 97.3 F (36.3 C) (05/16 0441) Temp Source: Oral (05/16 0441) BP: 91/52 (05/16 0441) Pulse Rate: 93 (05/16 0441)  Labs: Recent Labs    03/28/18 0543 03/28/18 1243  03/29/18 0459 03/29/18 1601 03/30/18 0514  HGB 14.3  --   --  13.8  --  12.9  HCT 42.2  --   --  41.5  --  38.7  PLT 266  --   --  243  --  221  HEPARINUNFRC 0.22* 0.40  --  0.60  --  0.57  CREATININE 0.55  --    < > 0.52 0.59 0.53   < > = values in this interval not displayed.    Estimated Creatinine Clearance: 87.9 mL/min (by C-G formula based on SCr of 0.53 mg/dL).   Medical History: Past Medical History:  Diagnosis Date  . Chronic left hip pain   . GERD (gastroesophageal reflux disease)   . Gestational diabetes   . Hearing loss of both ears   . Hypertension   . Neuropathy    numbness/tingling from waist down  . Vision abnormalities     Medications:  Scheduled:  . feeding supplement  1 Container Oral TID BM  . lisinopril  20 mg Oral Daily  . methocarbamol  500 mg Oral BID  . metoCLOPramide (REGLAN) injection  10 mg Intravenous Q6H  . potassium chloride  40 mEq Oral BID  . vitamin B-6  200 mg Oral Daily  . sodium chloride flush  10-40 mL Intracatheter Q12H  . tamsulosin  0.4 mg Oral QPC supper  . vitamin C  500 mg Oral BID   Infusions:  . dextrose 5 % and 0.9 % NaCl with KCl 40 mEq/L 75 mL/hr at 03/30/18 0947  . famotidine (PEPCID) IV Stopped (03/29/18 2231)  . heparin 1,050 Units/hr (03/30/18 0951)    Assessment: 72 YOF admitted with thoracic myelopathy s/p T7-T8 tumor resection, currently NPO due to persistent ileus and cecum, found with acute DVT now on IV heparin in case of eventual GI  procedure.  Heparin level (0.57) remains therapeutic this morning on 1050 units/hr Hgb 12.9, Pltc 221K. No bleeding noted. Post-op ileus has been improving, Xray still showed some distension, surgery signed off. Pending KUB, Advance diet as tolerated. Hopefully can transition to PO anticoagulation soon.   Goal of Therapy:  Heparin level 0.3-0.7 units/ml Monitor platelets by anticoagulation protocol: Yes   Plan:  Continue heparin gtt at 1050 units/hr Daily heparin level and CBC  Monitor s/sx of bleeding F/u plans for oral anticoagulation   Thank you for allowing [place] to participate in this patients care.  Maryanna Shape, PharmD, BCPS  Clinical Pharmacist  Pager: [phone]   03/30/2018 11:42 AM

## 2018-03-30 NOTE — Progress Notes (Signed)
Patient restful throughout shift, pleasant and cooperative, denies pain. Continue medical regime per MD's orders, IV Heparin and IVF continue, Consumed po liquids well. Redness to buttocks secondary to loose stools as noted , barrier cream applied. VSS,,made as comfortable as possible, monitor closely

## 2018-03-30 NOTE — Progress Notes (Signed)
Patient ID: Hannah Jefferson, female   DOB: 1956/09/25, 62 y.o.   MRN: [id number]     Subjective: States she feels dramatically better.  Has had multiple bowel movements.  Denies abdominal pain.  Tolerating full liquids.  No current abdominal or GI complaints.  Objective: Vital signs in last 24 hours: Temp:  [97.3 F (36.3 C)-97.7 F (36.5 C)] 97.3 F (36.3 C) (05/16 0441) Pulse Rate:  [82-95] 93 (05/16 0441) Resp:  [16-18] 16 (05/16 0441) BP: (91-127)/(52-92) 91/52 (05/16 0441) SpO2:  [97 %-99 %] 99 % (05/16 0441) Last BM Date: 03/29/18  Intake/Output from previous day: 05/15 0701 - 05/16 0700 In: 766 [P.O.:540; I.V.:126; IV Piggyback:100] Out: -  Intake/Output this shift: No intake/output data recorded.  General appearance: alert, cooperative and no distress GI: Mild to moderate distention and tympany but soft and nontender.  Lab Results:  Recent Labs    03/29/18 0459 03/30/18 0514  WBC 6.6 5.8  HGB 13.8 12.9  HCT 41.5 38.7  PLT 243 221   BMET Recent Labs    03/29/18 1601 03/30/18 0514  NA 136 134*  K 3.5 3.4*  CL 108 107  CO2 18* 19*  GLUCOSE 104* 109*  BUN 9 6  CREATININE 0.59 0.53  CALCIUM 9.1 8.9     Studies/Results: Dg Abd Portable 1v  Result Date: 03/29/2018 CLINICAL DATA:  Generalized abdominal pain. EXAM: PORTABLE ABDOMEN - 1 VIEW COMPARISON:  Radiographs of Mar 28, 2018. FINDINGS: Nasogastric tube tip is in expected position of distal stomach and unchanged. Status post cholecystectomy. Slightly improved diffuse large and small bowel dilatation is noted suggesting improving ileus. IMPRESSION: Slightly decreased diffuse large and small bowel dilatation is noted suggesting improving ileus. Continued radiographic follow-up is recommended. Electronically Signed   By: Marijo Conception, M.D.   On: 03/29/2018 09:54   Ir Picc Placement Right >5 Yrs Inc Img Guide  Result Date: 03/28/2018 INDICATION: Right femoral DVT, thoracic myelopathy, poor peripheral  access EXAM: ULTRASOUND AND FLUOROSCOPIC GUIDED PICC LINE INSERTION MEDICATIONS: 1% lidocaine local CONTRAST:  None FLUOROSCOPY TIME:  Twelve seconds (1 mGy) COMPLICATIONS: None immediate. TECHNIQUE: The procedure, risks, benefits, and alternatives were explained to the patient and informed written consent was obtained. A timeout was performed prior to the initiation of the procedure. The left upper extremity was prepped with chlorhexidine in a sterile fashion, and a sterile drape was applied covering the operative field. Maximum barrier sterile technique with sterile gowns and gloves were used for the procedure. A timeout was performed prior to the initiation of the procedure. Local anesthesia was provided with 1% lidocaine. Under direct ultrasound guidance, the patent left brachial vein was accessed with a micropuncture kit after the overlying soft tissues were anesthetized with 1% lidocaine. An ultrasound image was saved for documentation purposes. A guidewire was advanced to the level of the superior caval-atrial junction for measurement purposes and the PICC line was cut to length. A peel-away sheath was placed and a 46 cm, 5 [place], dual lumen was inserted to level of the superior caval-atrial junction. A post procedure spot fluoroscopic was obtained. The catheter easily aspirated and flushed and was sutured in place. A dressing was placed. The patient tolerated the procedure well without immediate post procedural complication. FINDINGS: After catheter placement, the tip lies within the superior cavoatrial junction. The catheter aspirates and flushes normally and is ready for immediate use. IMPRESSION: Successful ultrasound and fluoroscopic guided placement of a left brachial vein approach, 46 cm, 5 [place], dual  lumen PICC with tip at the superior caval-atrial junction. The PICC line is ready for immediate use. Electronically Signed   By: Jerilynn Mages.  Shick M.D.   On: 03/28/2018 15:36     Anti-infectives: Anti-infectives (From admission, onward)   Start     Dose/Rate Route Frequency Ordered Stop   03/24/18 1000  cefTRIAXone (ROCEPHIN) 1 g in sodium chloride 0.9 % 100 mL IVPB     1 g 200 mL/hr over 30 Minutes Intravenous Every 24 hours 03/24/18 0855     03/23/18 1400  cephALEXin (KEFLEX) 250 MG/5ML suspension 500 mg  Status:  Discontinued     500 mg Oral Every 8 hours 03/23/18 1134 03/24/18 0855      Assessment/Plan: Thoracic myelopathy s/pT7-T8 tumor resection by Dr. Gypsy Lore [facility]4/29/19 Paraplegia Left wrist fracture s/p ORIF  Adynamic ileus of the small bowel and colon Clinically this appears to be resolving quickly.  X-ray yesterday showed still some distention but improvement.  She has been more mobile with PT. Continue therapies and advance diet as tolerated.  We will sign off, please call if needed.      LOS: 9 days    Edward Jolly 03/30/2018

## 2018-03-30 NOTE — Progress Notes (Signed)
[place] PHYSICAL MEDICINE & REHABILITATION     PROGRESS NOTE    Subjective/Complaints: Feeling better. Had numerous bm's yesterday and this morning. Having flatus  ROS: Patient denies fever, rash, sore throat, blurred vision, , cough, shortness of breath or chest pain,  headache, or mood change.   Objective: Vital Signs: Blood pressure (!) 91/52, pulse 93, temperature (!) 97.3 F (36.3 C), temperature source Oral, resp. rate 16, height 5' 4" (1.626 m), weight 101.7 kg (224 lb 3.3 oz), SpO2 99 %. Dg Abd Portable 1v  Result Date: 03/29/2018 CLINICAL DATA:  Generalized abdominal pain. EXAM: PORTABLE ABDOMEN - 1 VIEW COMPARISON:  Radiographs of Mar 28, 2018. FINDINGS: Nasogastric tube tip is in expected position of distal stomach and unchanged. Status post cholecystectomy. Slightly improved diffuse large and small bowel dilatation is noted suggesting improving ileus. IMPRESSION: Slightly decreased diffuse large and small bowel dilatation is noted suggesting improving ileus. Continued radiographic follow-up is recommended. Electronically Signed   By: Marijo Conception, M.D.   On: 03/29/2018 09:54   Ir Picc Placement Right >5 Yrs Inc Img Guide  Result Date: 03/28/2018 INDICATION: Right femoral DVT, thoracic myelopathy, poor peripheral access EXAM: ULTRASOUND AND FLUOROSCOPIC GUIDED PICC LINE INSERTION MEDICATIONS: 1% lidocaine local CONTRAST:  None FLUOROSCOPY TIME:  Twelve seconds (1 mGy) COMPLICATIONS: None immediate. TECHNIQUE: The procedure, risks, benefits, and alternatives were explained to the patient and informed written consent was obtained. A timeout was performed prior to the initiation of the procedure. The left upper extremity was prepped with chlorhexidine in a sterile fashion, and a sterile drape was applied covering the operative field. Maximum barrier sterile technique with sterile gowns and gloves were used for the procedure. A timeout was performed prior to the initiation of the  procedure. Local anesthesia was provided with 1% lidocaine. Under direct ultrasound guidance, the patent left brachial vein was accessed with a micropuncture kit after the overlying soft tissues were anesthetized with 1% lidocaine. An ultrasound image was saved for documentation purposes. A guidewire was advanced to the level of the superior caval-atrial junction for measurement purposes and the PICC line was cut to length. A peel-away sheath was placed and a 46 cm, 5 [place], dual lumen was inserted to level of the superior caval-atrial junction. A post procedure spot fluoroscopic was obtained. The catheter easily aspirated and flushed and was sutured in place. A dressing was placed. The patient tolerated the procedure well without immediate post procedural complication. FINDINGS: After catheter placement, the tip lies within the superior cavoatrial junction. The catheter aspirates and flushes normally and is ready for immediate use. IMPRESSION: Successful ultrasound and fluoroscopic guided placement of a left brachial vein approach, 46 cm, 5 [place], dual lumen PICC with tip at the superior caval-atrial junction. The PICC line is ready for immediate use. Electronically Signed   By: Jerilynn Mages.  Shick M.D.   On: 03/28/2018 15:36   Recent Labs    03/29/18 0459 03/30/18 0514  WBC 6.6 5.8  HGB 13.8 12.9  HCT 41.5 38.7  PLT 243 221   Recent Labs    03/29/18 1601 03/30/18 0514  NA 136 134*  K 3.5 3.4*  CL 108 107  GLUCOSE 104* 109*  BUN 9 6  CREATININE 0.59 0.53  CALCIUM 9.1 8.9   CBG (last 3)  No results for input(s): GLUCAP in the last 72 hours.  Wt Readings from Last 3 Encounters:  03/21/18 101.7 kg (224 lb 3.3 oz)  03/20/18 93.4 kg (206 lb)  02/15/18 94.1 kg (207 lb 8 oz)    Physical Exam:  Constitutional: No distress . Vital signs reviewed. HEENT: EOMI, oral membranes moist Neck: supple Cardiovascular: RRR without murmur. No JVD    Respiratory: CTA Bilaterally without wheezes or rales.  Normal effort    GI: scarce bowel sounds. Still distended but less  Musculoskeletal: She exhibits noedema. Left arm in [facility]. Full movement of fingers.   Neurological: Motor RUE 5/5. LUE limited by splint can lift arm and wiggle all fingers. Decreased sensation to LT and proprioception, left greater than right brelow T8 dermatome. LE motor 2+ to 3/5 HF, KE and 3/5---motor/sensory stable.  ADF/PF. DTR's 1+. No resting tone.motor stable.  Skin: Back incision CDI. Midline RUE, PICC LUE Psychiatric:  Remains in good spirits.    Assessment/Plan: 1. Paraplegia and functional deficits secondary to T8 myelopathy, left wrist fracture which require 3+ hours per day of interdisciplinary therapy in a comprehensive inpatient rehab setting. Physiatrist is providing close team supervision and 24 hour management of active medical problems listed below. Physiatrist and rehab team continue to assess barriers to discharge/monitor patient progress toward functional and medical goals.  Function:  Bathing Bathing position Bathing activity did not occur: N/A Position: Wheelchair/chair at sink  Bathing parts Body parts bathed by patient: Right arm, Left arm, Chest, Abdomen, Right upper leg, Left upper leg Body parts bathed by helper: Back, Left lower leg, Right lower leg  Bathing assist Assist Level: Assistive device, 2 helpers Assistive Device Comment: reacher    Upper Body Dressing/Undressing Upper body dressing   What is the patient wearing?: Hospital gown(due to IV ) Bra - Perfomed by patient: Thread/unthread left bra strap, Thread/unthread right bra strap Bra - Perfomed by helper: Hook/unhook bra (pull down sports bra) Pull over shirt/dress - Perfomed by patient: Thread/unthread right sleeve, Thread/unthread left sleeve, Put head through opening Pull over shirt/dress - Perfomed by helper: Put head through opening, Pull shirt over trunk Button up shirt - Perfomed by patient: Thread/unthread right  sleeve, Thread/unthread left sleeve, Button/unbutton shirt Button up shirt - Perfomed by helper: Pull shirt around back    Upper body assist Assist Level: Touching or steadying assistance(Pt > 75%)      Lower Body Dressing/Undressing Lower body dressing Lower body dressing/undressing activity did not occur: N/A What is the patient wearing?: Hospital Gown, Shoes       Pants- Performed by helper: Thread/unthread right pants leg, Thread/unthread left pants leg, Pull pants up/down   Non-skid slipper socks- Performed by helper: Don/doff right sock, Don/doff left sock       Shoes - Performed by helper: Don/doff right shoe, Don/doff left shoe, Fasten right, Fasten left          Lower body assist Assist for lower body dressing: 2 Medical sales representative     Toileting steps completed by helper: Adjust clothing prior to toileting, Performs perineal hygiene, Adjust clothing after toileting Toileting Assistive Devices: Other (comment)(stedy)  Toileting assist Assist level: Two helpers   Transfers Chair/bed transfer   Chair/bed transfer method: Other Chair/bed transfer assist level: 2 helpers Chair/bed transfer assistive device: Mechanical lift Mechanical lift: Ecologist Ambulation activity did not occur: Safety/medical concerns         Wheelchair   Type: Manual Max wheelchair distance: 150 Assist Level: Touching or steadying assistance (Pt > 75%)  Cognition Comprehension Comprehension assist level: Follows complex conversation/direction with no assist  Expression Expression assist level: Expresses complex  ideas: With no assist  Social Interaction Social Interaction assist level: Interacts appropriately with others - No medications needed.  Problem Solving Problem solving assist level: Solves basic 75 - 89% of the time/requires cueing 10 - 24% of the time  Memory Memory assist level: Recognizes or recalls 50 - 74% of the time/requires cueing 25 -  49% of the time   Medical Problem List and Plan: 1.Paraplegia and funtional deficitssecondary to T8 meningioma/resection with myeopathy -recent left wrist fracture -continue PT, OT 2. DVT  Right common femoral vein (mobile), right posterior tibial and peroneal vein thrombi:  -continue IV heparin until ileus improves further  -OOB without issue.  3. Pain Management:Hydrocodone prn 4. Mood:LCSW to follow for evaluation and support. 5. Neuropsych: This patientiscapable of making decisions on herown behalf. 6. Skin/Wound Care:Routine pressure relief measures. 7. Fluids/Electrolytes/Nutrition:Monitor I/O.    -potassium 3.4 again today. Convert to oral potassium alone  --follow up pm labs.   -if potassium drops this afternoon, can give more IV K+  -KCl in IVF also 8. Post op ileus/neurogenic bowel:  -moving bowels, having flatus  -KUB showing gradual improvement. Today's KUB pending  -tolerating liquid diet. continue  -continue IV reglan  -encourage OOB 9.Neurogenic bladder:foley out today, voiding trial/caths  -E coli UTI--continue rocephin q24 x one more dose then stop 10. GERD:managed withPPIbid. 11. Hyponatremia: 134 today  -hctz stopped  -continue D5NS IV  12.Leftwrist fracture: NWB. [place]   LOS (Days) [place] EVALUATION WAS PERFORMED  Meredith Staggers, MD 03/30/2018 8:28 AM

## 2018-03-30 NOTE — Progress Notes (Signed)
Occupational Therapy Session Note  Patient Details  Name: Hannah Jefferson MRN: [id number] Date of Birth: 09-05-1956  Today's Date: 03/30/2018 OT Individual Time: 1030-1145 OT Individual Time Calculation (min): 75 min (missed last 15 min due to extreme fatigue)   Short Term Goals: Week 2:  OT Short Term Goal 1 (Week 2): Pt will be able to sit to stand with max A of 1 to prepare for LB self care. OT Short Term Goal 2 (Week 2): Pt will be able to squat pivot to w/c with max A of 1.  OT Short Term Goal 3 (Week 2): Pt will use AE to don pants over feet with mod A.  Skilled Therapeutic Interventions/Progress Updates:    Pt received in TIS w/c reclined back. Pt and sister stated she had already had UB bathed and perineal area.  Pt bathed upper legs, had A for lower legs.  Pt began use of a reacher to don pants over feet with max A.  Pt's wc adjacent to bed so bed rail raised to allow pt to use R hand to pull up on rail.  With +2 A pt assisted into stand with mod A and pt pulling on rail and support at L leg.  Pt stood up to adjust pants when she stated she felt she was having a BM.  Loose liquidy stool was leaking out.  Pt stood in this manner 2 more times for clean up but was continuing to leak stool. On the 3rd stand, her sister provided a 3rd person support as pt was getting tired. Pt's legs completely buckled and total A to sit in w/c. No fall. Pt was becoming pale. Tilted chair back to allow pt to rest, drink.  Once pt feeling better, switched to use of Stedy lift.  Pt stood up in lift but more bowel leaking before brief could be pulled up. Stood again to get brief up.  Pt very fatigued at this point so transferred back to bed.  Pt's feet elevated.  Pt feeling better and color coming back to her face but she was totally exhausted. Pt opting to only use hospital gowns until her bowel issues become resolved.  Pt in bed with sister in the room with her.   Therapy Documentation Precautions:   Precautions Precautions: Fall, Back Precaution Comments: LUE NWB in splint Restrictions Weight Bearing Restrictions: Yes LUE Weight Bearing: Non weight bearing General: General OT Amount of Missed Time: 15 Minutes   Pain: no c/o pain   ADL: ADL ADL Comments: refer to functional navigator  See Function Navigator for Current Functional Status.   Therapy/Group: Individual Therapy  [place] 03/30/2018, 12:35 PM

## 2018-03-30 NOTE — Progress Notes (Signed)
Physical Therapy Weekly Progress Note  Patient Details  Name: Hannah Jefferson MRN: [id number] Date of Birth: 1956/02/23  Beginning of progress report period: Mar 22, 2018 End of progress report period: Mar 30, 2018  Today's Date: 03/30/2018 PT Individual Time: 1300-1400 PT Individual Time Calculation (min): 60 min   Patient has met 2 of 4 short term goals.  Pt made some progress towards goals this week despite being on bedrest for 4 days.  She continues to require heavy +2 assist for all mobility, but is motivated to progress and demonstrates carryover from education session to session.  Patient continues to demonstrate the following deficits muscle weakness and muscle paralysis, impaired timing and sequencing, abnormal tone, unbalanced muscle activation and decreased coordination and decreased sitting balance, decreased standing balance, decreased postural control, decreased balance strategies and paraplegia and therefore will continue to benefit from skilled PT intervention to increase functional independence with mobility.  Patient progressing toward long term goals..  Continue plan of care.  PT Short Term Goals Week 1:  PT Short Term Goal 1 (Week 1): Pt will verbalize 2 methods of pressure relief in w/c with min cues PT Short Term Goal 1 - Progress (Week 1): Not met PT Short Term Goal 2 (Week 1): Pt will tolerate supported standing x1 minute for decreased burden of care with ADLs PT Short Term Goal 2 - Progress (Week 1): Not met PT Short Term Goal 3 (Week 1): Pt will initiate w/c propulsion for UE strengthening and cardiovascular endurance PT Short Term Goal 3 - Progress (Week 1): Met PT Short Term Goal 4 (Week 1): Pt will utilize LEs and RUE to assist with bed mobility in 100% of opportunities with min cues PT Short Term Goal 4 - Progress (Week 1): Met Week 2:  PT Short Term Goal 1 (Week 2): Pt will demonstrate 1 method of pressure relief in w/c with min cues. PT Short Term Goal 2 (Week  2): Pt will propel w/c 25' with supervision. PT Short Term Goal 3 (Week 2): Pt will transition to standing with +1 assist in 25% of opportunities with LRAD.   PT Short Term Goal 4 (Week 2): Pt will tolerate supported standing x1 minute for decreased burden of care with ADLs PT Short Term Goal 5 (Week 2): Pt will self direct use of bed features for decreased burden of care with bed mobility.    Skilled Therapeutic Interventions/Progress Updates:    pt with no c/o pain on arrival, but reports fatigue from earlier sessions and feeling sick to her stomach.  Session focus on taping for edema/ecchymosis management, w/c adjustment for improved sitting tolerance, and education.    PT and pt discussed current level of function, barriers towards progress, and plan of care throughout session.  PT applied kinesiotape to L elbow in fluid dynamic pattern to reduce edema, eccymosis, and pain to improve functional use within limits of weight bearing restrictions to reduce burden of care.  Discussed precautions for kinesiotape and educated on signs of intolerance; both pt and sister verbalized understanding.  PT also adjusted pt's w/c headrest to improve overall out of bed, sitting, and activity tolerance.    Pt continues to be limited in out of bed tolerance by frequent/liquid stools when up.  Remains positioned in bed at end of session, RN aware of request for something for nausea.  Call bell in reach and needs met   Therapy Documentation Precautions:  Precautions Precautions: Fall, Back Precaution Comments: LUE NWB in splint Restrictions  Weight Bearing Restrictions: Yes LUE Weight Bearing: Non weight bearing General: PT Amount of Missed Time (min): 15 Minutes PT Missed Treatment Reason: Patient fatigue;Patient ill (Comment)   See Function Navigator for Current Functional Status.  Therapy/Group: Individual Therapy   E  03/30/2018, 4:50 PM  

## 2018-03-30 NOTE — Progress Notes (Signed)
Physical Therapy Session Note  Patient Details  Name: Hannah Jefferson MRN: [id number] Date of Birth: 04/20/1956  Today's Date: 03/30/2018 PT Individual Time: 0830-0900 PT Individual Time Calculation (min): 30 min   Short Term Goals: Week 1:  PT Short Term Goal 1 (Week 1): Pt will verbalize 2 methods of pressure relief in w/c with min cues PT Short Term Goal 2 (Week 1): Pt will tolerate supported standing x1 minute for decreased burden of care with ADLs PT Short Term Goal 3 (Week 1): Pt will initiate w/c propulsion for UE strengthening and cardiovascular endurance PT Short Term Goal 4 (Week 1): Pt will utilize LEs and RUE to assist with bed mobility in 100% of opportunities with min cues  Skilled Therapeutic Interventions/Progress Updates:  Skilled co-treat with PT clinical specialist. Pt received seated in bed, denies pain and agreeable to treatment. Rolling to L minA; sidelying>sit modA. Sit >stand +2 from elevated bed with stedy for powering up through LEs. MinA sit>stand from stedy seat, minA/min guard for static standing balance while second helper managed brief prior to toileting. Required several minutes on toilet to void. Discussed options for tilt in space vs standard chair; educated pt and sister in law in various methods for performing pressure relief in standard chair, however due to 1.5 hr break until next session, pt and therapists decided to transfer to tilt in space chair. Transferred to w/c with stedy as above +2. Scooting back in chair modA +2 with cues for technique, weight shifting. Remained seated in w/c at sink with sister in law present, all needs in reach.      Therapy Documentation Precautions:  Precautions Precautions: Fall, Back Precaution Comments: LUE NWB in splint Restrictions Weight Bearing Restrictions: Yes LUE Weight Bearing: Non weight bearing   See Function Navigator for Current Functional Status.   Therapy/Group: Individual Therapy  Luberta Mutter 03/30/2018, 3:59 PM

## 2018-03-31 ENCOUNTER — Inpatient Hospital Stay (HOSPITAL_COMMUNITY): Payer: 59 | Admitting: Physical Therapy

## 2018-03-31 ENCOUNTER — Inpatient Hospital Stay (HOSPITAL_COMMUNITY): Payer: 59 | Admitting: Occupational Therapy

## 2018-03-31 ENCOUNTER — Inpatient Hospital Stay (HOSPITAL_COMMUNITY): Payer: 59

## 2018-03-31 LAB — BASIC METABOLIC PANEL
Anion gap: 8 (ref 5–15)
Anion gap: 3 — ABNORMAL LOW (ref 5–15)
Anion gap: 10 (ref 5–15)
BUN: <5 mg/dL — ABNORMAL LOW (ref 6–20)
BUN: <5 mg/dL — ABNORMAL LOW (ref 6–20)
BUN: 5 mg/dL — ABNORMAL LOW (ref 6–20)
CO2: 19 mmol/L — ABNORMAL LOW (ref 22–32)
CO2: 19 mmol/L — ABNORMAL LOW (ref 22–32)
CO2: 23 mmol/L (ref 22–32)
Calcium: 9.3 mg/dL (ref 8.9–10.3)
Calcium: 8.6 mg/dL — ABNORMAL LOW (ref 8.9–10.3)
Calcium: 9.4 mg/dL (ref 8.9–10.3)
Chloride: 106 mmol/L (ref 101–111)
Chloride: 102 mmol/L (ref 101–111)
Chloride: 101 mmol/L (ref 101–111)
Creatinine, Ser: 0.52 mg/dL (ref 0.44–1.00)
Creatinine, Ser: 0.51 mg/dL (ref 0.44–1.00)
Creatinine, Ser: 0.51 mg/dL (ref 0.44–1.00)
GFR calc Af Amer: >60 mL/min (ref >60)
GFR calc Af Amer: >60 mL/min (ref >60)
GFR calc Af Amer: >60 mL/min (ref >60)
GFR calc non Af Amer: >60 mL/min (ref >60)
GFR calc non Af Amer: >60 mL/min (ref >60)
GFR calc non Af Amer: >60 mL/min (ref >60)
Glucose, Bld: 144 mg/dL — ABNORMAL HIGH (ref 65–99)
Glucose, Bld: 126 mg/dL — ABNORMAL HIGH (ref 65–99)
Glucose, Bld: 148 mg/dL — ABNORMAL HIGH (ref 65–99)
Potassium: 3.9 mmol/L (ref 3.5–5.1)
Potassium: >7.5 mmol/L (ref 3.5–5.1)
Potassium: 3.6 mmol/L (ref 3.5–5.1)
Sodium: 133 mmol/L — ABNORMAL LOW (ref 135–145)
Sodium: 124 mmol/L — ABNORMAL LOW (ref 135–145)
Sodium: 134 mmol/L — ABNORMAL LOW (ref 135–145)

## 2018-03-31 LAB — CBC
HCT: 41.9 % (ref 36.0–46.0)
Hemoglobin: 14.1 g/dL (ref 12.0–15.0)
MCH: 29.1 pg (ref 26.0–34.0)
MCHC: 33.7 g/dL (ref 30.0–36.0)
MCV: 86.6 fL (ref 78.0–100.0)
Platelets: 248 10*3/uL (ref 150–400)
RBC: 4.84 MIL/uL (ref 3.87–5.11)
RDW: 14.4 % (ref 11.5–15.5)
WBC: 8 10*3/uL (ref 4.0–10.5)

## 2018-03-31 LAB — CBC WITH DIFFERENTIAL/PLATELET
Abs Immature Granulocytes: 0.1 10*3/uL (ref 0.0–0.1)
Basophils Absolute: 0 10*3/uL (ref 0.0–0.1)
Basophils Relative: 0 %
Eosinophils Absolute: 0 10*3/uL (ref 0.0–0.7)
Eosinophils Relative: 0 %
HCT: 40.6 % (ref 36.0–46.0)
Hemoglobin: 14 g/dL (ref 12.0–15.0)
Immature Granulocytes: 1 %
Lymphocytes Relative: 8 %
Lymphs Abs: 0.8 10*3/uL (ref 0.7–4.0)
MCH: 29.7 pg (ref 26.0–34.0)
MCHC: 34.5 g/dL (ref 30.0–36.0)
MCV: 86 fL (ref 78.0–100.0)
Monocytes Absolute: 0.6 10*3/uL (ref 0.1–1.0)
Monocytes Relative: 6 %
Neutro Abs: 8.3 10*3/uL — ABNORMAL HIGH (ref 1.7–7.7)
Neutrophils Relative %: 85 %
Platelets: 243 10*3/uL (ref 150–400)
RBC: 4.72 MIL/uL (ref 3.87–5.11)
RDW: 14 % (ref 11.5–15.5)
WBC: 9.7 10*3/uL (ref 4.0–10.5)

## 2018-03-31 LAB — OCCULT BLOOD GASTRIC / DUODENUM (SPECIMEN CUP): Occult Blood, Gastric: POSITIVE — AB

## 2018-03-31 LAB — TROPONIN I
Troponin I: <0.03 ng/mL (ref <0.03)
Troponin I: <0.03 ng/mL (ref <0.03)

## 2018-03-31 LAB — HEPARIN LEVEL (UNFRACTIONATED): Heparin Unfractionated: 0.58 IU/mL (ref 0.30–0.70)

## 2018-03-31 MED ORDER — LIDOCAINE VISCOUS HCL 2 % MT SOLN
OROMUCOSAL | Status: AC
  Administered 2018-03-31: 2 mL via OROMUCOSAL
  Filled 2018-03-31: qty 15

## 2018-03-31 MED ORDER — FAMOTIDINE IN NACL 20-0.9 MG/50ML-% IV SOLN: 20.0000 mg | Freq: Two times a day (BID) | INTRAVENOUS | Status: DC

## 2018-03-31 MED ORDER — LIDOCAINE VISCOUS 2 % MT SOLN
15.0000 mL | Freq: Once | OROMUCOSAL | Status: AC
  Administered 2018-03-31: 2 mL via OROMUCOSAL
  Filled 2018-03-31: qty 15

## 2018-03-31 MED ORDER — IOPAMIDOL (ISOVUE-300) INJECTION 61%
INTRAVENOUS | Status: AC
  Administered 2018-03-31: 20:00:00
  Filled 2018-03-31: qty 30

## 2018-03-31 MED ORDER — POTASSIUM CHLORIDE 10 MEQ/100ML IV SOLN
10.0000 meq | INTRAVENOUS | Status: AC
  Administered 2018-03-31 (×3): 10 meq via INTRAVENOUS
  Filled 2018-03-31 (×3): qty 100

## 2018-03-31 NOTE — Progress Notes (Signed)
Upon assessing patient noted large amount of brownish emesis,abdomen distended and  bowel sounds absent in upper quads and lower right quads  (assessed by 2 RN). Patient instructed not to drink liquids, notified on call and informed of clinical changes Danella Sensing, [place]).Order received for NG Tube insertion on department. Patient was informed of new order,very anxious and tearful, reassurances provided. Was informed by patient of prior difficulties with insertions of NGT,having  to be placed in Radiology . Department. On call notified with new stat order received and requested. Radiology Department notified of stat order .

## 2018-03-31 NOTE — Progress Notes (Signed)
Dr. Naaman Plummer made aware of clinical changes,no new orders

## 2018-03-31 NOTE — Progress Notes (Signed)
Occupational Therapy Note  Patient Details  Name: Hannah Jefferson MRN: [id number] Date of Birth: 1956-04-05  Today's Date: 03/31/2018 OT Missed Time: 17 Minutes Missed Time Reason: Pain;Patient fatigue;Patient ill (comment)  Pt reported that she was in a great deal of pain from new NG tube placement that occurred just before her OT session.  She was not able to sleep all night due to vomiting.   Pt stated that she was extremely fatigued and could not handle any morning therapies.  She is declining OT and PT AM sessions.   Reported to PT that pt requests to cancel.     [place] 03/31/2018, 10:02 AM

## 2018-03-31 NOTE — Progress Notes (Addendum)
Patient with elevated BP/heart rate but asymptomatic. Dark coffee ground drainage from NGT noted --likely due to tube trauma.  Continues to have pain in abdomen and right upper chest.   Surgery did not feel that small bowel protocol study needed as contrast went thorough last time. CT abd/pelvis ordered and pending. KUB without acute changes and CXR negative for aspiration.   CBC repeated H/H stable and on elevation in WBC.  BMET reportedly showed K-7 but patient is DBIV and  was getting IV K+.  Will get stat repeat for confirmation.    Attending: Awaiting repeat lab work. KUB and CXR reviewed and without acute change. CT of abdomen with GI contrast pending. Pt producing stool via rectum and coffee ground drainage per NGT. Pt with nausea and what appears to be chest wall discomfort, perhaps musculoskeletal from therapy. Oxygen sats stable, no SOB. Has been on IV heparin since Saturday for DVT.   Continue NGT to suction with supportive care. Surgery and GI both well aware of patient. Spoke to RN re: plan.  Meredith Staggers, MD, [facility] 03/31/2018

## 2018-03-31 NOTE — Progress Notes (Signed)
Physical Therapy Note  Patient Details  Name: Hannah Jefferson MRN: [id number] Date of Birth: 15-May-1956 Today's Date: 03/31/2018    Attempted to see pt for scheduled AM therapy session.  Pt sleeping soundly, per OT had not slept all night and had a rough time with insertion of NG tube.  Returned for PM session, RN and PA in room, BP 144/104 at rest.  Per PA pt not to be seen for remainder of the day.  Will continue to follow as pt medically able to participate.     Michel Santee 03/31/2018, 11:49 AM

## 2018-03-31 NOTE — Progress Notes (Signed)
ANTICOAGULATION CONSULT NOTE - Follow up [place] for heparin Indication: acute mobile DVT  No Known Allergies  Patient Measurements: Height: 5\' 4"  (162.6 cm) Weight: 224 lb 3.3 oz (101.7 kg) IBW/kg (Calculated) : 54.7 Heparin Dosing Weight: 78 kg  Vital Signs: Temp: 98.2 F (36.8 C) (05/17 0423) Temp Source: Oral (05/17 0423) BP: 125/96 (05/17 0423) Pulse Rate: 107 (05/17 0423)  Labs: Recent Labs    03/29/18 0459  03/30/18 0514 03/30/18 1739 03/31/18 0401  HGB 13.8  --  12.9  --  14.1  HCT 41.5  --  38.7  --  41.9  PLT 243  --  221  --  248  HEPARINUNFRC 0.60  --  0.57  --  0.58  CREATININE 0.52   < > 0.53 0.50 0.52   < > = values in this interval not displayed.    Estimated Creatinine Clearance: 87.9 mL/min (by C-G formula based on SCr of 0.52 mg/dL).   Medical History: Past Medical History:  Diagnosis Date  . Chronic left hip pain   . GERD (gastroesophageal reflux disease)   . Gestational diabetes   . Hearing loss of both ears   . Hypertension   . Neuropathy    numbness/tingling from waist down  . Vision abnormalities     Medications:  Scheduled:  . calcium carbonate  1 tablet Oral TID WC  . feeding supplement  1 Container Oral TID BM  . lisinopril  20 mg Oral Daily  . methocarbamol  500 mg Oral BID  . metoCLOPramide (REGLAN) injection  10 mg Intravenous Q6H  . potassium chloride  40 mEq Oral BID  . vitamin B-6  200 mg Oral Daily  . sodium chloride flush  10-40 mL Intracatheter Q12H  . tamsulosin  0.4 mg Oral QPC supper  . vitamin C  500 mg Oral BID   Infusions:  . dextrose 5 % and 0.9 % NaCl with KCl 40 mEq/L 75 mL/hr at 03/31/18 0211  . heparin 1,050 Units/hr (03/31/18 0135)    Assessment: 58 YOF admitted with thoracic myelopathy s/p T7-T8 tumor resection, currently NPO due to persistent ileus and cecum, found with acute DVT now on IV heparin in case of eventual GI procedure.  Heparin level (0.58) remains therapeutic this  morning on 1050 units/hr. Hgb wnl at 14.1 and  Pltc 2481K. No bleeding noted. Post-op ileus has been improving, Xray still showed some distension, surgery had signed off.  KUB 5/16: cecum appears more distended. She did not tolerate liquids, began vomiting overnight, increased abdominal distention.  Back to NPO, IR insertion of NGT pending. MD awaiting any further GI recs, considering re-consulting surgery.    Goal of Therapy:  Heparin level 0.3-0.7 units/ml Monitor platelets by anticoagulation protocol: Yes   Plan:  Continue heparin gtt at 1050 units/hr Daily heparin level and CBC  Monitor s/sx of bleeding F/u plans for oral anticoagulation, or plan for surgery.    Thank you for allowing [place] to participate in this patients care. Nicole Cella, RPh Clinical Pharmacist Pager: [phone] 8AM-4PM: [phone] 4PM-10PM: [phone],CBJSE 10PM: [place] [phone] 03/31/2018 10:51 AM

## 2018-03-31 NOTE — Progress Notes (Signed)
Events since yesterday noted.  From talking with the patient and her friend, [name], at the bedside, it is evident in retrospect that the patient never really tolerated liquids that well, taking just small amounts.  Last night, this culminated in nausea and protracted vomiting.  Her KUB yesterday had shown both small and large bowel dilatation.  I spoke with the surgical physician assistant this afternoon and she indicated that they have been made aware of the need for reinsertion of the NG tube, but they did not feel that there was anything surgical going on.  Exam: Patient lying in bed, appears a bit weary but in no distress.  Abdomen perhaps slightly distended, bowel sounds present, NG tube has drained a couple 100 mL's of dark brown fluid.  Labs: Potassium 3.9 this morning  Impression: Persistent/recurrent ileus, presumably a consequence of her spinal meningioma resection  Plan: Observe on NG suction for now.  I do not think the decompressive colonoscopy would likely be helpful.  Could consider small bowel protocol study, but with large amounts of gas in the colon, it seems unlikely that that would reveal an anatomic obstruction, which is probably also by the surgeons do not feel that they have a role in the patient's management at this point.  Whenever the patient is restarted on liquids, I would favor frequent small aliquots of clear liquids, advanced very gradually.  However, it appears that will be at least days before that can be accomplished.  We will continue to follow the patient with you.  Cleotis Nipper, M.D. Pager [phone] If no answer or after 5 PM call [phone]

## 2018-03-31 NOTE — Progress Notes (Signed)
[facility]     PROGRESS NOTE    Subjective/Complaints: Pt began vomiting overnight, increased abdominal distention. Belly tight/uncomfortable again  ROS: Patient denies fever, rash, sore throat, blurred vision,   cough, shortness of breath or chest pain, headache. Upset by setback    Objective: Vital Signs: Blood pressure (!) 125/96, pulse (!) 107, temperature 98.2 F (36.8 C), temperature source Oral, resp. rate (!) 21, height 5\' 4"  (1.626 m), weight 101.7 kg (224 lb 3.3 oz), SpO2 99 %. Dg Abd 1 View  Result Date: 03/30/2018 CLINICAL DATA:  Ileus. EXAM: ABDOMEN - 1 VIEW COMPARISON:  03/29/2018. FINDINGS: NG tube not noted on today's exam. Persistent distended loops of colon and small bowel noted. Findings most consistent adynamic ileus. Follow-up exam suggested to demonstrate resolution and to exclude obstruction. Surgical clips right upper quadrant and pelvis. Pelvic calcifications consistent phleboliths. Lumbar spine scoliosis concave right. IMPRESSION: NG tube not visualized on today's exam. Persistent distended loops of colon and small bowel. Findings most consistent with persistent adynamic ileus. Similar findings noted on prior exam. Follow-up exams to demonstrate resolution suggested. Electronically Signed   By: Marcello Moores  Register   On: 03/30/2018 12:31   Recent Labs    03/30/18 0514 03/31/18 0401  WBC 5.8 8.0  HGB 12.9 14.1  HCT 38.7 41.9  PLT 221 248   Recent Labs    03/30/18 1739 03/31/18 0401  NA 135 133*  K 3.6 3.9  CL 110 106  GLUCOSE 127* 144*  BUN <5* <5*  CREATININE 0.50 0.52  CALCIUM 9.1 9.3   CBG (last 3)  No results for input(s): GLUCAP in the last 72 hours.  Wt Readings from Last 3 Encounters:  03/21/18 101.7 kg (224 lb 3.3 oz)  03/20/18 93.4 kg (206 lb)  02/15/18 94.1 kg (207 lb 8 oz)    Physical Exam:  Constitutional: uncomfortable appearing. Vital signs reviewed. HEENT: EOMI, oral membranes moist Neck:  supple Cardiovascular: RRR without murmur. No JVD    Respiratory: CTA Bilaterally without wheezes or rales. Normal effort    GI: no BS. Abdomen more distended. No frank tenderness.   Musculoskeletal: She exhibits noedema. Left arm in [facility]. Full movement of fingers.   Neurological: Motor RUE 5/5. LUE limited by splint can lift arm and wiggle all fingers. Decreased sensation to LT and proprioception, left greater than right brelow T8 dermatome. LE motor 2+ to 3/5 HF, KE and 3/5---motor/sensory stable.  ADF/PF. DTR's 1+. No resting tone.motor/sensory exam stable  Skin: Back incision CDI. Midline RUE, PICC LUE Psychiatric:  Pt being the "good sport" but expectedly down about her stomach issues    Assessment/Plan: 1. Paraplegia and functional deficits secondary to T8 myelopathy, left wrist fracture which require 3+ hours per day of interdisciplinary therapy in a comprehensive inpatient rehab setting. Physiatrist is providing close team supervision and 24 hour management of active medical problems listed below. Physiatrist and rehab team continue to assess barriers to discharge/monitor patient progress toward functional and medical goals.  Function:  Bathing Bathing position Bathing activity did not occur: N/A Position: Wheelchair/chair at sink  Bathing parts Body parts bathed by patient: Right arm, Left arm, Chest, Abdomen, Right upper leg, Left upper leg Body parts bathed by helper: Back, Left lower leg, Right lower leg  Bathing assist Assist Level: Assistive device, 2 helpers Assistive Device Comment: reacher    Upper Body Dressing/Undressing Upper body dressing   What is the patient wearing?: Hospital gown(due to IV ) Bra -  Perfomed by patient: Thread/unthread left bra strap, Thread/unthread right bra strap Bra - Perfomed by helper: Hook/unhook bra (pull down sports bra) Pull over shirt/dress - Perfomed by patient: Thread/unthread right sleeve, Thread/unthread left sleeve, Put head  through opening Pull over shirt/dress - Perfomed by helper: Put head through opening, Pull shirt over trunk Button up shirt - Perfomed by patient: Thread/unthread right sleeve, Thread/unthread left sleeve, Button/unbutton shirt Button up shirt - Perfomed by helper: Pull shirt around back    Upper body assist Assist Level: Touching or steadying assistance(Pt > 75%)      Lower Body Dressing/Undressing Lower body dressing Lower body dressing/undressing activity did not occur: N/A What is the patient wearing?: Hospital Gown, Shoes       Pants- Performed by helper: Thread/unthread right pants leg, Thread/unthread left pants leg, Pull pants up/down   Non-skid slipper socks- Performed by helper: Don/doff right sock, Don/doff left sock       Shoes - Performed by helper: Don/doff right shoe, Don/doff left shoe, Fasten right, Fasten left          Lower body assist Assist for lower body dressing: 2 Automotive engineer activity did not occur: Safety/medical concerns   Toileting steps completed by helper: Adjust clothing prior to toileting, Performs perineal hygiene, Adjust clothing after toileting Toileting Assistive Devices: Other (comment)(stedy)  Toileting assist Assist level: Two helpers   Transfers Chair/bed transfer   Chair/bed transfer method: Other Chair/bed transfer assist level: 2 helpers Chair/bed transfer assistive device: Mechanical lift Mechanical lift: Ecologist Ambulation activity did not occur: Safety/medical concerns         Wheelchair   Type: Manual Max wheelchair distance: 150 Assist Level: Touching or steadying assistance (Pt > 75%)  Cognition Comprehension Comprehension assist level: Follows complex conversation/direction with no assist  Expression Expression assist level: Expresses complex ideas: With no assist  Social Interaction Social Interaction assist level: Interacts appropriately with others - No  medications needed.  Problem Solving Problem solving assist level: Solves basic 75 - 89% of the time/requires cueing 10 - 24% of the time  Memory Memory assist level: Recognizes or recalls 50 - 74% of the time/requires cueing 25 - 49% of the time   Medical Problem List and Plan: 1.Paraplegia and funtional deficitssecondary to T8 meningioma/resection with myeopathy -recent left wrist fracture -continue PT, OT to tolerance 2. DVT  Right common femoral vein (mobile), right posterior tibial and peroneal vein thrombi:  -continue IV heparin until ileus improves further     3. Pain Management:Hydrocodone prn 4. Mood:LCSW to follow for evaluation and support. 5. Neuropsych: This patientiscapable of making decisions on herown behalf. 6. Skin/Wound Care:Routine pressure relief measures. 7. Fluids/Electrolytes/Nutrition:Monitor I/O.    -potassium 3.9    --BID labs  -all K+ through IV/IVF for now   -may have to consider TNA until ileus resolves 8. Post op ileus/neurogenic bowel:  -did not tolerate liquids  -cecum appears more distended on KUB  -NPO, IR insertion of NGT pending  -continue IV reglan  -encourage OOB to tolerance  -await any further GI recs, consider re-consulting surgery 9.Neurogenic bladder:foley out today, voiding trial/caths  -E coli UTI--continue rocephin q24 completed 10. GERD:managed withPPIbid. 11. Hyponatremia: 133 today  -hctz stopped  -continue D5NS IV  12.Leftwrist fracture: NWB. [place]   LOS (Days) [place] EVALUATION WAS PERFORMED  Meredith Staggers, MD 03/31/2018 8:58 AM

## 2018-04-01 ENCOUNTER — Inpatient Hospital Stay (HOSPITAL_COMMUNITY): Payer: 59

## 2018-04-01 ENCOUNTER — Inpatient Hospital Stay (HOSPITAL_COMMUNITY): Payer: 59 | Admitting: Physical Therapy

## 2018-04-01 DIAGNOSIS — K567 Ileus, unspecified: Secondary | ICD-10-CM

## 2018-04-01 DIAGNOSIS — K9189 Other postprocedural complications and disorders of digestive system: Secondary | ICD-10-CM

## 2018-04-01 LAB — HEMOGLOBIN: Hemoglobin: 14.3 g/dL (ref 12.0–15.0)

## 2018-04-01 LAB — CBC
HCT: 42.6 % (ref 36.0–46.0)
Hemoglobin: 14.5 g/dL (ref 12.0–15.0)
MCH: 29.1 pg (ref 26.0–34.0)
MCHC: 34 g/dL (ref 30.0–36.0)
MCV: 85.5 fL (ref 78.0–100.0)
Platelets: 240 10*3/uL (ref 150–400)
RBC: 4.98 MIL/uL (ref 3.87–5.11)
RDW: 14.3 % (ref 11.5–15.5)
WBC: 12.1 10*3/uL — ABNORMAL HIGH (ref 4.0–10.5)

## 2018-04-01 LAB — BASIC METABOLIC PANEL
Anion gap: 11 (ref 5–15)
BUN: 7 mg/dL (ref 6–20)
CO2: 24 mmol/L (ref 22–32)
Calcium: 9.3 mg/dL (ref 8.9–10.3)
Chloride: 99 mmol/L — ABNORMAL LOW (ref 101–111)
Creatinine, Ser: 0.57 mg/dL (ref 0.44–1.00)
GFR calc Af Amer: >60 mL/min (ref >60)
GFR calc non Af Amer: >60 mL/min (ref >60)
Glucose, Bld: 158 mg/dL — ABNORMAL HIGH (ref 65–99)
Potassium: 3.3 mmol/L — ABNORMAL LOW (ref 3.5–5.1)
Sodium: 134 mmol/L — ABNORMAL LOW (ref 135–145)

## 2018-04-01 LAB — TROPONIN I: Troponin I: <0.03 ng/mL (ref <0.03)

## 2018-04-01 LAB — HEPARIN LEVEL (UNFRACTIONATED): Heparin Unfractionated: 0.44 IU/mL (ref 0.30–0.70)

## 2018-04-01 MED ORDER — IOHEXOL 300 MG/ML  SOLN
100.0000 mL | Freq: Once | INTRAMUSCULAR | Status: AC | PRN
  Administered 2018-04-01: 100 mL via INTRAVENOUS

## 2018-04-01 MED ORDER — GENERIC EXTERNAL MEDICATION
Status: DC
Start: ? — End: 2018-04-01

## 2018-04-01 MED ORDER — PANTOPRAZOLE SODIUM 40 MG IV SOLR
40.0000 mg | Freq: Two times a day (BID) | INTRAVENOUS | Status: DC
  Administered 2018-04-01 – 2018-04-10 (×17): 40 mg via INTRAVENOUS
  Filled 2018-04-01 (×19): qty 40

## 2018-04-01 NOTE — Progress Notes (Signed)
[facility] PROGRESS NOTE Subjective Patient with NG tube with dark output. Still not passing much air  Objective: Vital signs in last 24 hours: Temp:  [97.4 F (36.3 C)-97.9 F (36.6 C)] 97.4 F (36.3 C) (05/18 0357) Pulse Rate:  [104-117] 104 (05/18 0507) Resp:  [16-20] 20 (05/18 0357) BP: (131-145)/(98-107) 145/107 (05/18 0507) SpO2:  [96 %-98 %] 96 % (05/18 0357) Last BM Date: 03/31/18  Intake/Output from previous day: 05/17 0701 - 05/18 0700 In: 3158.5 [I.V.:2158.5; NG/GT:1000] Out: 1875 [Urine:375; Emesis/NG output:1500] Intake/Output this shift: No intake/output data recorded.  PE: General--NG training dark material  Abdomen--nontender minimal distention only a few bowel sounds  Lab Results: Recent Labs    03/30/18 0514 03/31/18 0401 03/31/18 1532 04/01/18 0311  WBC 5.8 8.0 9.7 12.1*  HGB 12.9 14.1 14.0 14.5  HCT 38.7 41.9 40.6 42.6  PLT 221 248 243 240   BMET Recent Labs    03/30/18 1739 03/31/18 0401 03/31/18 1532 03/31/18 1725 04/01/18 0311  NA 135 133* 124* 134* 134*  K 3.6 3.9 >7.5* 3.6 3.3*  CL 110 106 102 101 99*  CO2 20* 19* 19* 23 24  CREATININE 0.50 0.52 0.51 0.51 0.57   LFT No results for input(s): PROT, AST, ALT, ALKPHOS, BILITOT, BILIDIR, IBILI in the last 72 hours. PT/INR No results for input(s): LABPROT, INR in the last 72 hours. PANCREAS No results for input(s): LIPASE in the last 72 hours.       Studies/Results: Dg Abd 1 View  Result Date: 03/31/2018 CLINICAL DATA:  Nasogastric catheter placement, ileus EXAM: ABDOMEN - 1 VIEW COMPARISON:  03/30/2018 FINDINGS: Nasogastric catheter is noted to be within the stomach following fluoroscopic placement. 1 minutes and 12 seconds of fluoroscopy was utilized. IMPRESSION: Nasogastric catheter within the stomach. Electronically Signed   By: Inez Catalina M.D.   On: 03/31/2018 09:15   Dg Abd 1 View  Result Date: 03/30/2018 CLINICAL DATA:  Ileus. EXAM: ABDOMEN - 1 VIEW  COMPARISON:  03/29/2018. FINDINGS: NG tube not noted on today's exam. Persistent distended loops of colon and small bowel noted. Findings most consistent adynamic ileus. Follow-up exam suggested to demonstrate resolution and to exclude obstruction. Surgical clips right upper quadrant and pelvis. Pelvic calcifications consistent phleboliths. Lumbar spine scoliosis concave right. IMPRESSION: NG tube not visualized on today's exam. Persistent distended loops of colon and small bowel. Findings most consistent with persistent adynamic ileus. Similar findings noted on prior exam. Follow-up exams to demonstrate resolution suggested. Electronically Signed   By: Marcello Moores  Register   On: 03/30/2018 12:31   Ct Abdomen Pelvis W Contrast  Result Date: 04/01/2018 CLINICAL DATA:  Abdominal distention, post tumor resection from back. EXAM: CT ABDOMEN AND PELVIS WITH CONTRAST TECHNIQUE: Multidetector CT imaging of the abdomen and pelvis was performed using the standard protocol following bolus administration of intravenous contrast. CONTRAST:  190mL OMNIPAQUE IOHEXOL 300 MG/ML  SOLN COMPARISON:  03/27/2018 CT, abdomen radiographs from 03/31/2018 and CXR from 03/31/2018 FINDINGS: Lower chest: Borderline cardiomegaly. PICC line tip noted in the right atrium. There are trace bilateral pleural effusions with atelectasis. Hepatobiliary: No focal liver abnormality is seen. Status post cholecystectomy. No biliary dilatation. Pancreas: Unremarkable. No pancreatic ductal dilatation or surrounding inflammatory changes. Spleen: Normal in size without focal abnormality. Adrenals/Urinary Tract: Adrenal glands are unremarkable. Kidneys are normal, without renal calculi, focal lesion, or hydronephrosis. Bladder is unremarkable. Stomach/Bowel: Decompressed stomach with gastric tube in place. Contrast distended small bowel re-demonstrated without mechanical bowel obstruction compatible with postoperative ileus. Fluid-filled  large bowel loops  without contrast is noted to the level of the rectum. Vascular/Lymphatic: No significant vascular findings are present. No enlarged abdominal or pelvic lymph nodes. Reproductive: Uterus and bilateral adnexa are unremarkable. Other: No abdominopelvic ascites or free intraperitoneal air. Musculoskeletal: Postsurgical change involving posterior elements from T7 through T8. Levoconvex curvature of the lumbar spine. Chronic spondylolisthesis of L5 with grade 1 bordering grade 2 spondylolisthesis of L5 on S1. Degenerative disc disease L2-3 and L5-S1. IMPRESSION: 1. Persistent small bowel ileus with enteric contrast noted to the level of the mid to distal ileum. No mechanical source of bowel obstruction. 2. Postop change of the lower thoracic spine. 3. Trace bilateral pleural effusions with atelectasis. Electronically Signed   By: Ashley Royalty M.D.   On: 04/01/2018 01:11   Dg Chest Port 1v Same Day  Result Date: 03/31/2018 CLINICAL DATA:  Chest pain abdominal pain EXAM: PORTABLE CHEST 1 VIEW COMPARISON:  None. FINDINGS: NG in the stomach.  Left arm PICC tip in the right atrium. Heart size normal. Negative for heart failure. Minimal right lower lobe atelectasis. No effusion. IMPRESSION: PICC tip in the right atrium Minimal right lower lobe atelectasis. Electronically Signed   By: Franchot Gallo M.D.   On: 03/31/2018 16:05   Dg Abd Portable 1v  Result Date: 03/31/2018 CLINICAL DATA:  Abdominal pain EXAM: PORTABLE ABDOMEN - 1 VIEW COMPARISON:  03/30/2018 and 03/31/2018 FINDINGS: NG remains in the stomach. Diffuse large and small bowel dilatation unchanged. Findings compatible with ileus which is predominately involving the colon. Surgical clips in the gallbladder fossa.  Lumbar scoliosis IMPRESSION: NG remains in the stomach.  Moderate to severe ileus unchanged. Electronically Signed   By: Franchot Gallo M.D.   On: 03/31/2018 16:01    Medications: I have reviewed the patient's current medications.  Assessment:    1. Ileus. This is probably multifactorialand related to her surgery.she is on IV pantoprazole. We try to replace her potassium. We may need to give this IV since she is back on NG suction.   Plan: Try to get potassium up above 4 and half keep it between 4 1/2 -5 and see if this will improve ileus We will follow.   Nancy Fetter 04/01/2018, 10:52 AM  This note was created using voice recognition software. Minor errors may Have occurred unintentionally.  Pager: [phone] If no answer or after hours call [phone]

## 2018-04-01 NOTE — Progress Notes (Addendum)
Hannah Jefferson is a 62 y.o. female 03-10-1956 [id number]  Subjective: Dark NG tube output. Slept ok. Feeling poorly. No fever  Objective: Vital signs in last 24 hours: Temp:  [97.4 F (36.3 C)-97.9 F (36.6 C)] 97.4 F (36.3 C) (05/18 0357) Pulse Rate:  [104-117] 104 (05/18 0507) Resp:  [16-20] 20 (05/18 0357) BP: (131-145)/(98-107) 145/107 (05/18 0507) SpO2:  [96 %-98 %] 96 % (05/18 0357) Weight change:  Last BM Date: 03/31/18  Intake/Output from previous day: 05/17 0701 - 05/18 0700 In: 3158.5 [I.V.:2158.5; NG/GT:1000] Out: 1875 [Urine:375; Emesis/NG output:1500] Last cbgs: CBG (last 3)  No results for input(s): GLUCAP in the last 72 hours.   Physical Exam General: No apparent distress.  Looks tired HEENT: not dry NG w/dark d/c Lungs: Normal effort. Lungs clear to auscultation, no crackles or wheezes. Cardiovascular: Regular rate and rhythm, no edema Abdomen: S/NT; BS(sluggish), distended Musculoskeletal:  unchanged Neurological: No new neurological deficits Wounds: N/A    Skin: clear  Not pale Mental state: Alert, oriented, cooperative    Lab Results: BMET    Component Value Date/Time   NA 134 (L) 04/01/2018 0311   K 3.3 (L) 04/01/2018 0311   CL 99 (L) 04/01/2018 0311   CO2 24 04/01/2018 0311   GLUCOSE 158 (H) 04/01/2018 0311   BUN 7 04/01/2018 0311   CREATININE 0.57 04/01/2018 0311   CALCIUM 9.3 04/01/2018 0311   GFRNONAA >60 04/01/2018 0311   GFRAA >60 04/01/2018 0311   CBC    Component Value Date/Time   WBC 12.1 (H) 04/01/2018 0311   RBC 4.98 04/01/2018 0311   HGB 14.5 04/01/2018 0311   HCT 42.6 04/01/2018 0311   PLT 240 04/01/2018 0311   MCV 85.5 04/01/2018 0311   MCH 29.1 04/01/2018 0311   MCHC 34.0 04/01/2018 0311   RDW 14.3 04/01/2018 0311   LYMPHSABS 0.8 03/31/2018 1532   MONOABS 0.6 03/31/2018 1532   EOSABS 0.0 03/31/2018 1532   BASOSABS 0.0 03/31/2018 1532    Studies/Results: Dg Abd 1 View  Result Date: 03/31/2018 CLINICAL  DATA:  Nasogastric catheter placement, ileus EXAM: ABDOMEN - 1 VIEW COMPARISON:  03/30/2018 FINDINGS: Nasogastric catheter is noted to be within the stomach following fluoroscopic placement. 1 minutes and 12 seconds of fluoroscopy was utilized. IMPRESSION: Nasogastric catheter within the stomach. Electronically Signed   By: Inez Catalina M.D.   On: 03/31/2018 09:15   Dg Abd 1 View  Result Date: 03/30/2018 CLINICAL DATA:  Ileus. EXAM: ABDOMEN - 1 VIEW COMPARISON:  03/29/2018. FINDINGS: NG tube not noted on today's exam. Persistent distended loops of colon and small bowel noted. Findings most consistent adynamic ileus. Follow-up exam suggested to demonstrate resolution and to exclude obstruction. Surgical clips right upper quadrant and pelvis. Pelvic calcifications consistent phleboliths. Lumbar spine scoliosis concave right. IMPRESSION: NG tube not visualized on today's exam. Persistent distended loops of colon and small bowel. Findings most consistent with persistent adynamic ileus. Similar findings noted on prior exam. Follow-up exams to demonstrate resolution suggested. Electronically Signed   By: Marcello Moores  Register   On: 03/30/2018 12:31   Ct Abdomen Pelvis W Contrast  Result Date: 04/01/2018 CLINICAL DATA:  Abdominal distention, post tumor resection from back. EXAM: CT ABDOMEN AND PELVIS WITH CONTRAST TECHNIQUE: Multidetector CT imaging of the abdomen and pelvis was performed using the standard protocol following bolus administration of intravenous contrast. CONTRAST:  168mL OMNIPAQUE IOHEXOL 300 MG/ML  SOLN COMPARISON:  03/27/2018 CT, abdomen radiographs from 03/31/2018 and CXR from 03/31/2018 FINDINGS: Lower  chest: Borderline cardiomegaly. PICC line tip noted in the right atrium. There are trace bilateral pleural effusions with atelectasis. Hepatobiliary: No focal liver abnormality is seen. Status post cholecystectomy. No biliary dilatation. Pancreas: Unremarkable. No pancreatic ductal dilatation or  surrounding inflammatory changes. Spleen: Normal in size without focal abnormality. Adrenals/Urinary Tract: Adrenal glands are unremarkable. Kidneys are normal, without renal calculi, focal lesion, or hydronephrosis. Bladder is unremarkable. Stomach/Bowel: Decompressed stomach with gastric tube in place. Contrast distended small bowel re-demonstrated without mechanical bowel obstruction compatible with postoperative ileus. Fluid-filled large bowel loops without contrast is noted to the level of the rectum. Vascular/Lymphatic: No significant vascular findings are present. No enlarged abdominal or pelvic lymph nodes. Reproductive: Uterus and bilateral adnexa are unremarkable. Other: No abdominopelvic ascites or free intraperitoneal air. Musculoskeletal: Postsurgical change involving posterior elements from T7 through T8. Levoconvex curvature of the lumbar spine. Chronic spondylolisthesis of L5 with grade 1 bordering grade 2 spondylolisthesis of L5 on S1. Degenerative disc disease L2-3 and L5-S1. IMPRESSION: 1. Persistent small bowel ileus with enteric contrast noted to the level of the mid to distal ileum. No mechanical source of bowel obstruction. 2. Postop change of the lower thoracic spine. 3. Trace bilateral pleural effusions with atelectasis. Electronically Signed   By: Ashley Royalty M.D.   On: 04/01/2018 01:11   Dg Chest Port 1v Same Day  Result Date: 03/31/2018 CLINICAL DATA:  Chest pain abdominal pain EXAM: PORTABLE CHEST 1 VIEW COMPARISON:  None. FINDINGS: NG in the stomach.  Left arm PICC tip in the right atrium. Heart size normal. Negative for heart failure. Minimal right lower lobe atelectasis. No effusion. IMPRESSION: PICC tip in the right atrium Minimal right lower lobe atelectasis. Electronically Signed   By: Franchot Gallo M.D.   On: 03/31/2018 16:05   Dg Abd Portable 1v  Result Date: 03/31/2018 CLINICAL DATA:  Abdominal pain EXAM: PORTABLE ABDOMEN - 1 VIEW COMPARISON:  03/30/2018 and 03/31/2018  FINDINGS: NG remains in the stomach. Diffuse large and small bowel dilatation unchanged. Findings compatible with ileus which is predominately involving the colon. Surgical clips in the gallbladder fossa.  Lumbar scoliosis IMPRESSION: NG remains in the stomach.  Moderate to severe ileus unchanged. Electronically Signed   By: Franchot Gallo M.D.   On: 03/31/2018 16:01    Medications: I have reviewed the patient's current medications.  Assessment/Plan:  1. Paraplegia and funtional deficitssecondary to T8 meningioma/resection with myeopathy -recent left wrist fracture -continue PT, OT to tolerance 2. DVT. On IV Heparin - will keep on a low end of therapeutic range or hold 3. Pain - norco prn 4. Ileus/ neurogenic bowel. CT w/o abscess/perforation. NPO/NG tube 5. Dark NG tube OP. Hgb was ok. Possibly related to NG tube placement. PPI IV. May need to hold heparin. Hgb q 6 h x2. GI help is very appreciated. 6. L wrist fx - PT/OT 7. Hyponatremia, hypokalemia - IVF  A complex case  Length of stay, days: [employer] , MD 04/01/2018, 8:54 AM

## 2018-04-01 NOTE — Progress Notes (Addendum)
Dr. Leanne Chang notified of pts BP 148/108.

## 2018-04-01 NOTE — Progress Notes (Signed)
Occupational Therapy Note  Patient Details  Name: Hannah Jefferson MRN: [id number] Date of Birth: Oct 10, 1956  Today's Date: 04/01/2018 OT Missed Time: 75 Minutes Missed Time Reason: Other (comment);Patient ill (comment)(RN reporting bedrest at this time)  RN reporting pt with heparin drip and on bed rest. Pt missed 75 min skilled OT. Will follow up as medically available.   Lowella Dell Haillie Radu 04/01/2018, 4:13 PM

## 2018-04-01 NOTE — Progress Notes (Signed)
ANTICOAGULATION CONSULT NOTE - Follow up [facility] for heparin Indication: acute mobile DVT  No Known Allergies  Patient Measurements: Height: 5\' 4"  (162.6 cm) Weight: 224 lb 3.3 oz (101.7 kg) IBW/kg (Calculated) : 54.7 Heparin Dosing Weight: 78 kg  Vital Signs: Temp: 97.4 F (36.3 C) (05/18 0357) Temp Source: Oral (05/18 0357) BP: 145/107 (05/18 0507) Pulse Rate: 104 (05/18 0507)  Labs: Recent Labs    03/30/18 0514  03/31/18 0401 03/31/18 1532 03/31/18 1725 03/31/18 2146 04/01/18 0311 04/01/18 0500  HGB 12.9  --  14.1 14.0  --   --  14.5  --   HCT 38.7  --  41.9 40.6  --   --  42.6  --   PLT 221  --  248 243  --   --  240  --   HEPARINUNFRC 0.57  --  0.58  --   --   --   --  0.44  CREATININE 0.53   < > 0.52 0.51 0.51  --  0.57  --   TROPONINI  --   --   --  <0.03  --  <0.03 <0.03  --    < > = values in this interval not displayed.    Estimated Creatinine Clearance: 87.9 mL/min (by C-G formula based on SCr of 0.57 mg/dL).   Medical History: Past Medical History:  Diagnosis Date  . Chronic left hip pain   . GERD (gastroesophageal reflux disease)   . Gestational diabetes   . Hearing loss of both ears   . Hypertension   . Neuropathy    numbness/tingling from waist down  . Vision abnormalities     Medications:  Scheduled:  . calcium carbonate  1 tablet Oral TID WC  . feeding supplement  1 Container Oral TID BM  . lisinopril  20 mg Oral Daily  . methocarbamol  500 mg Oral BID  . metoCLOPramide (REGLAN) injection  10 mg Intravenous Q6H  . pantoprazole (PROTONIX) IV  40 mg Intravenous Q12H  . potassium chloride  40 mEq Oral BID  . vitamin B-6  200 mg Oral Daily  . sodium chloride flush  10-40 mL Intracatheter Q12H  . tamsulosin  0.4 mg Oral QPC supper  . vitamin C  500 mg Oral BID   Infusions:  . dextrose 5 % and 0.9 % NaCl with KCl 40 mEq/L 75 mL/hr at 03/31/18 2251  . heparin 1,050 Units/hr (04/01/18 0354)    Assessment: 76 YOF  admitted with thoracic myelopathy s/p T7-T8 tumor resection, currently NPO due to persistent ileus and cecum, found with acute DVT now on IV heparin in case of eventual GI procedure.  Heparin level remains therapeutic today, and is at the lower end of the therapeutic goal as requested by Dr. Alain Marion. Note she is having dark NG output and serial h/h has been ordered.  Currently her hemoglobin and platelet count are stable from yesterday's labs.   Goal of Therapy:  Heparin level 0.3-0.5 units/ml  - targeting low end of range per Dr. Alain Marion Monitor platelets by anticoagulation protocol: Yes   Plan:  Continue heparin gtt at 1050 units/hr Daily heparin level and CBC  Monitor s/sx of bleeding  Legrand Como, Pharm.D., BCPS, BCIDP Clinical Pharmacist Phone: [phone] or [phone] 04/01/2018, 9:25 AM

## 2018-04-02 ENCOUNTER — Inpatient Hospital Stay (HOSPITAL_COMMUNITY): Payer: 59

## 2018-04-02 LAB — CBC
HCT: 39.8 % (ref 36.0–46.0)
Hemoglobin: 13.4 g/dL (ref 12.0–15.0)
MCH: 29.7 pg (ref 26.0–34.0)
MCHC: 33.7 g/dL (ref 30.0–36.0)
MCV: 88.2 fL (ref 78.0–100.0)
Platelets: 205 10*3/uL (ref 150–400)
RBC: 4.51 MIL/uL (ref 3.87–5.11)
RDW: 14.9 % (ref 11.5–15.5)
WBC: 9.7 10*3/uL (ref 4.0–10.5)

## 2018-04-02 LAB — BASIC METABOLIC PANEL
Anion gap: 9 (ref 5–15)
BUN: 10 mg/dL (ref 6–20)
CO2: 25 mmol/L (ref 22–32)
Calcium: 9.2 mg/dL (ref 8.9–10.3)
Chloride: 106 mmol/L (ref 101–111)
Creatinine, Ser: 0.5 mg/dL (ref 0.44–1.00)
GFR calc Af Amer: >60 mL/min (ref >60)
GFR calc non Af Amer: >60 mL/min (ref >60)
Glucose, Bld: 133 mg/dL — ABNORMAL HIGH (ref 65–99)
Potassium: 3.2 mmol/L — ABNORMAL LOW (ref 3.5–5.1)
Sodium: 140 mmol/L (ref 135–145)

## 2018-04-02 LAB — HEPARIN LEVEL (UNFRACTIONATED): Heparin Unfractionated: 0.49 IU/mL (ref 0.30–0.70)

## 2018-04-02 MED ORDER — POTASSIUM CHLORIDE 10 MEQ/100ML IV SOLN
10.0000 meq | Freq: Once | INTRAVENOUS | Status: AC
  Administered 2018-04-02: 10 meq via INTRAVENOUS

## 2018-04-02 MED ORDER — POTASSIUM CHLORIDE 10 MEQ/100ML IV SOLN
10.0000 meq | Freq: Four times a day (QID) | INTRAVENOUS | Status: DC
  Administered 2018-04-02 – 2018-04-03 (×5): 10 meq via INTRAVENOUS
  Filled 2018-04-02 (×10): qty 100

## 2018-04-02 NOTE — Progress Notes (Signed)
Occupational Therapy Session Note  Patient Details  Name: Hannah Jefferson MRN: [id number] Date of Birth: [birth year]  Today's Date: 04/02/2018 OT Individual Time: 1694-5038 OT Individual Time Calculation (min): 55 min    Short Term Goals: Week 2:  OT Short Term Goal 1 (Week 2): Pt will be able to sit to stand with max A of 1 to prepare for LB self care. OT Short Term Goal 2 (Week 2): Pt will be able to squat pivot to w/c with max A of 1.  OT Short Term Goal 3 (Week 2): Pt will use AE to don pants over feet with mod A.  Skilled Therapeutic Interventions/Progress Updates:    1:1. Pt agreeable to tx session. Pt completes supine to sitting EOB with MAX A with VC for log rolling and remains seated EOB >30 min. OT provides pt with red foam handle for pen to complete word search activity as this is a preferred pass time and relaxing for pt. Pt reports anxiety improving as completing activity. Pt sits EOB and assists OT washing hair with rinseless shampoo, brushes and blow dries with MOD A to improve RUE strength and activity tolerance. Pt supine to sit with MOD A for LE management. Exited session with call light in reach and all needs met  Therapy Documentation Precautions:  Precautions Precautions: Fall, Back Precaution Comments: LUE NWB in splint Restrictions Weight Bearing Restrictions: Yes LUE Weight Bearing: Non weight bearing General:   Vital Signs: Therapy Vitals Temp: 97.7 F (36.5 C) Temp Source: Oral Pulse Rate: 95 Resp: 18 BP: (!) 126/97 Patient Position (if appropriate): Lying Oxygen Therapy SpO2: 96 % O2 Device: Room Air  See Function Navigator for Current Functional Status.   Therapy/Group: Individual Therapy  [name] 04/02/2018, 4:56 PM

## 2018-04-02 NOTE — Progress Notes (Signed)
[facility] PROGRESS NOTE Subjective Patient states that she is feeling better output from NG tube is no longer dark Most recent labs reveal potassium is actually dropped down to 3.2 hemoglobin 13.4 minimal-change Objective: Vital signs in last 24 hours: Temp:  [97.6 F (36.4 C)-98.6 F (37 C)] 97.8 F (36.6 C) (05/19 0404) Pulse Rate:  [94-108] 94 (05/19 0404) Resp:  [17-18] 17 (05/19 0404) BP: (144-148)/(96-108) 144/102 (05/19 0404) SpO2:  [95 %-96 %] 96 % (05/19 0404) Last BM Date: 04/01/18  Intake/Output from previous day: 05/18 0701 - 05/19 0700 In: 2135.3 [I.V.:2135.3] Out: 900 [Emesis/NG output:900] Intake/Output this shift: No intake/output data recorded.  PE: General--no distress NG output greenish-brown not bloody  Abdomen--soft nontender nondistended  Lab Results: Recent Labs    03/31/18 0401 03/31/18 1532 04/01/18 0311 04/01/18 1916 04/02/18 0342  WBC 8.0 9.7 12.1*  --  9.7  HGB 14.1 14.0 14.5 14.3 13.4  HCT 41.9 40.6 42.6  --  39.8  PLT 248 243 240  --  205   BMET Recent Labs    03/31/18 0401 03/31/18 1532 03/31/18 1725 04/01/18 0311 04/02/18 0342  NA 133* 124* 134* 134* 140  K 3.9 >7.5* 3.6 3.3* 3.2*  CL 106 102 101 99* 106  CO2 19* 19* 23 24 25   CREATININE 0.52 0.51 0.51 0.57 0.50   LFT No results for input(s): PROT, AST, ALT, ALKPHOS, BILITOT, BILIDIR, IBILI in the last 72 hours. PT/INR No results for input(s): LABPROT, INR in the last 72 hours. PANCREAS No results for input(s): LIPASE in the last 72 hours.       Studies/Results: Ct Abdomen Pelvis W Contrast  Result Date: 04/01/2018 CLINICAL DATA:  Abdominal distention, post tumor resection from back. EXAM: CT ABDOMEN AND PELVIS WITH CONTRAST TECHNIQUE: Multidetector CT imaging of the abdomen and pelvis was performed using the standard protocol following bolus administration of intravenous contrast. CONTRAST:  151mL OMNIPAQUE IOHEXOL 300 MG/ML  SOLN COMPARISON:   03/27/2018 CT, abdomen radiographs from 03/31/2018 and CXR from 03/31/2018 FINDINGS: Lower chest: Borderline cardiomegaly. PICC line tip noted in the right atrium. There are trace bilateral pleural effusions with atelectasis. Hepatobiliary: No focal liver abnormality is seen. Status post cholecystectomy. No biliary dilatation. Pancreas: Unremarkable. No pancreatic ductal dilatation or surrounding inflammatory changes. Spleen: Normal in size without focal abnormality. Adrenals/Urinary Tract: Adrenal glands are unremarkable. Kidneys are normal, without renal calculi, focal lesion, or hydronephrosis. Bladder is unremarkable. Stomach/Bowel: Decompressed stomach with gastric tube in place. Contrast distended small bowel re-demonstrated without mechanical bowel obstruction compatible with postoperative ileus. Fluid-filled large bowel loops without contrast is noted to the level of the rectum. Vascular/Lymphatic: No significant vascular findings are present. No enlarged abdominal or pelvic lymph nodes. Reproductive: Uterus and bilateral adnexa are unremarkable. Other: No abdominopelvic ascites or free intraperitoneal air. Musculoskeletal: Postsurgical change involving posterior elements from T7 through T8. Levoconvex curvature of the lumbar spine. Chronic spondylolisthesis of L5 with grade 1 bordering grade 2 spondylolisthesis of L5 on S1. Degenerative disc disease L2-3 and L5-S1. IMPRESSION: 1. Persistent small bowel ileus with enteric contrast noted to the level of the mid to distal ileum. No mechanical source of bowel obstruction. 2. Postop change of the lower thoracic spine. 3. Trace bilateral pleural effusions with atelectasis. Electronically Signed   By: Ashley Royalty M.D.   On: 04/01/2018 01:11   Dg Chest Port 1v Same Day  Result Date: 03/31/2018 CLINICAL DATA:  Chest pain abdominal pain EXAM: PORTABLE CHEST 1 VIEW COMPARISON:  None. FINDINGS:  NG in the stomach.  Left arm PICC tip in the right atrium. Heart size  normal. Negative for heart failure. Minimal right lower lobe atelectasis. No effusion. IMPRESSION: PICC tip in the right atrium Minimal right lower lobe atelectasis. Electronically Signed   By: Franchot Gallo M.D.   On: 03/31/2018 16:05   Dg Abd Portable 1v  Result Date: 03/31/2018 CLINICAL DATA:  Abdominal pain EXAM: PORTABLE ABDOMEN - 1 VIEW COMPARISON:  03/30/2018 and 03/31/2018 FINDINGS: NG remains in the stomach. Diffuse large and small bowel dilatation unchanged. Findings compatible with ileus which is predominately involving the colon. Surgical clips in the gallbladder fossa.  Lumbar scoliosis IMPRESSION: NG remains in the stomach.  Moderate to severe ileus unchanged. Electronically Signed   By: Franchot Gallo M.D.   On: 03/31/2018 16:01    Medications: I have reviewed the patient's current medications.  Assessment:   1.  Ileus 2.  Hypokalemia   Plan: Have discussed with Dr. Alain Marion.  The patient's potassium is actually dropped on oral replacement.  Think we need to give her some IV potassium replacement and attempt to keep her potassium 4.5-to 5.0 for several days in a row to see if this will increase the motility of her gut.  In order to avoid mistakes, we will leave the potassium replacement orders up to her primary doctors.   Nancy Fetter 04/02/2018, 10:31 AM  This note was created using voice recognition software. Minor errors may Have occurred unintentionally.  Pager: [phone] If no answer or after hours call [phone]

## 2018-04-02 NOTE — Progress Notes (Signed)
Pt was able to tolerate sips of water with NG clamped without any complaints of nausea.

## 2018-04-02 NOTE — Progress Notes (Signed)
Pt tolerated NGT clamped for one hour,  8pm-9pm.

## 2018-04-02 NOTE — Progress Notes (Signed)
Hannah Jefferson is a 62 y.o. female 04-01-1956 [id number]  Subjective: No new complaints. No new problems. Slept well. Feeling better.  The patient wants to try clear liquids.  Passing gas  Objective: Vital signs in last 24 hours: Temp:  [97.6 F (36.4 C)-98.6 F (37 C)] 97.8 F (36.6 C) (05/19 0404) Pulse Rate:  [94-108] 94 (05/19 0404) Resp:  [17-18] 17 (05/19 0404) BP: (144-148)/(96-108) 144/102 (05/19 0404) SpO2:  [95 %-96 %] 96 % (05/19 0404) Weight change:  Last BM Date: 04/01/18  Intake/Output from previous day: 05/18 0701 - 05/19 0700 In: 2135.3 [I.V.:2135.3] Out: 900 [Emesis/NG output:900] Last cbgs: CBG (last 3)  No results for input(s): GLUCAP in the last 72 hours.   Physical Exam General: No apparent distress   HEENT: not dry NG tube with dark output.  Lungs: Normal effort. Lungs clear to auscultation, no crackles or wheezes. Cardiovascular: Regular rate and rhythm, no edema Abdomen: S/NT/distended; BS(+) -sluggish Musculoskeletal:  unchanged Neurological: No new neurological deficits Wounds: N/A    Skin: clear  Mental state: Alert, oriented, cooperative    Lab Results: BMET    Component Value Date/Time   NA 140 04/02/2018 0342   K 3.2 (L) 04/02/2018 0342   CL 106 04/02/2018 0342   CO2 25 04/02/2018 0342   GLUCOSE 133 (H) 04/02/2018 0342   BUN 10 04/02/2018 0342   CREATININE 0.50 04/02/2018 0342   CALCIUM 9.2 04/02/2018 0342   GFRNONAA >60 04/02/2018 0342   GFRAA >60 04/02/2018 0342   CBC    Component Value Date/Time   WBC 9.7 04/02/2018 0342   RBC 4.51 04/02/2018 0342   HGB 13.4 04/02/2018 0342   HCT 39.8 04/02/2018 0342   PLT 205 04/02/2018 0342   MCV 88.2 04/02/2018 0342   MCH 29.7 04/02/2018 0342   MCHC 33.7 04/02/2018 0342   RDW 14.9 04/02/2018 0342   LYMPHSABS 0.8 03/31/2018 1532   MONOABS 0.6 03/31/2018 1532   EOSABS 0.0 03/31/2018 1532   BASOSABS 0.0 03/31/2018 1532    Studies/Results: Ct Abdomen Pelvis W  Contrast  Result Date: 04/01/2018 CLINICAL DATA:  Abdominal distention, post tumor resection from back. EXAM: CT ABDOMEN AND PELVIS WITH CONTRAST TECHNIQUE: Multidetector CT imaging of the abdomen and pelvis was performed using the standard protocol following bolus administration of intravenous contrast. CONTRAST:  169mL OMNIPAQUE IOHEXOL 300 MG/ML  SOLN COMPARISON:  03/27/2018 CT, abdomen radiographs from 03/31/2018 and CXR from 03/31/2018 FINDINGS: Lower chest: Borderline cardiomegaly. PICC line tip noted in the right atrium. There are trace bilateral pleural effusions with atelectasis. Hepatobiliary: No focal liver abnormality is seen. Status post cholecystectomy. No biliary dilatation. Pancreas: Unremarkable. No pancreatic ductal dilatation or surrounding inflammatory changes. Spleen: Normal in size without focal abnormality. Adrenals/Urinary Tract: Adrenal glands are unremarkable. Kidneys are normal, without renal calculi, focal lesion, or hydronephrosis. Bladder is unremarkable. Stomach/Bowel: Decompressed stomach with gastric tube in place. Contrast distended small bowel re-demonstrated without mechanical bowel obstruction compatible with postoperative ileus. Fluid-filled large bowel loops without contrast is noted to the level of the rectum. Vascular/Lymphatic: No significant vascular findings are present. No enlarged abdominal or pelvic lymph nodes. Reproductive: Uterus and bilateral adnexa are unremarkable. Other: No abdominopelvic ascites or free intraperitoneal air. Musculoskeletal: Postsurgical change involving posterior elements from T7 through T8. Levoconvex curvature of the lumbar spine. Chronic spondylolisthesis of L5 with grade 1 bordering grade 2 spondylolisthesis of L5 on S1. Degenerative disc disease L2-3 and L5-S1. IMPRESSION: 1. Persistent small bowel ileus with enteric contrast noted  to the level of the mid to distal ileum. No mechanical source of bowel obstruction. 2. Postop change of the  lower thoracic spine. 3. Trace bilateral pleural effusions with atelectasis. Electronically Signed   By: Ashley Royalty M.D.   On: 04/01/2018 01:11   Dg Chest Port 1v Same Day  Result Date: 03/31/2018 CLINICAL DATA:  Chest pain abdominal pain EXAM: PORTABLE CHEST 1 VIEW COMPARISON:  None. FINDINGS: NG in the stomach.  Left arm PICC tip in the right atrium. Heart size normal. Negative for heart failure. Minimal right lower lobe atelectasis. No effusion. IMPRESSION: PICC tip in the right atrium Minimal right lower lobe atelectasis. Electronically Signed   By: Franchot Gallo M.D.   On: 03/31/2018 16:05   Dg Abd Portable 1v  Result Date: 03/31/2018 CLINICAL DATA:  Abdominal pain EXAM: PORTABLE ABDOMEN - 1 VIEW COMPARISON:  03/30/2018 and 03/31/2018 FINDINGS: NG remains in the stomach. Diffuse large and small bowel dilatation unchanged. Findings compatible with ileus which is predominately involving the colon. Surgical clips in the gallbladder fossa.  Lumbar scoliosis IMPRESSION: NG remains in the stomach.  Moderate to severe ileus unchanged. Electronically Signed   By: Franchot Gallo M.D.   On: 03/31/2018 16:01    Medications: I have reviewed the patient's current medications.  Assessment/Plan:   1.  Paraplegia and functional deficit secondary to T8 meningioma/resection with myelopathy.  Recent left wrist fracture.  CIR 2. DVT on IV heparin-will keep on a low end of therapeutic range or hold if upper GI bleeding continues or if the hemoglobin would drop. 3. pain control with Norco as needed 4.  Ileus/neurogenic bowel.  CT without an abscess/perforation.  She is currently n.p.o. with NG tube.  Will replace potassium IV.  Discussed with Dr. Oletta Lamas.  We will try to clamp NG and have a trial of clear liquids 5.  Dark NG tube output.  Hemoglobin has been okay.  Possible erosion due to NG tube placement.  We will continue with IV PPI.  May need to hold heparin. 6.  Hyponatremia.  Monitoring.    Length  of stay, days: 12  Walker Kehr , MD 04/02/2018, 11:53 AM

## 2018-04-02 NOTE — Progress Notes (Signed)
ANTICOAGULATION CONSULT NOTE - Follow up [place] for heparin Indication: acute mobile DVT  No Known Allergies  Patient Measurements: Height: 5\' 4"  (162.6 cm) Weight: 224 lb 3.3 oz (101.7 kg) IBW/kg (Calculated) : 54.7 Heparin Dosing Weight: 78 kg  Vital Signs: Temp: 97.8 F (36.6 C) (05/19 0404) Temp Source: Axillary (05/19 0404) BP: 144/102 (05/19 0404) Pulse Rate: 94 (05/19 0404)  Labs: Recent Labs    03/31/18 0401 03/31/18 1532 03/31/18 1725 03/31/18 2146 04/01/18 0311 04/01/18 0500 04/01/18 1916 04/02/18 0342  HGB 14.1 14.0  --   --  14.5  --  14.3 13.4  HCT 41.9 40.6  --   --  42.6  --   --  39.8  PLT 248 243  --   --  240  --   --  205  HEPARINUNFRC 0.58  --   --   --   --  0.44  --  0.49  CREATININE 0.52 0.51 0.51  --  0.57  --   --  0.50  TROPONINI  --  <0.03  --  <0.03 <0.03  --   --   --     Estimated Creatinine Clearance: 87.9 mL/min (by C-G formula based on SCr of 0.5 mg/dL).   Medical History: Past Medical History:  Diagnosis Date  . Chronic left hip pain   . GERD (gastroesophageal reflux disease)   . Gestational diabetes   . Hearing loss of both ears   . Hypertension   . Neuropathy    numbness/tingling from waist down  . Vision abnormalities     Medications:  Scheduled:  . calcium carbonate  1 tablet Oral TID WC  . feeding supplement  1 Container Oral TID BM  . lisinopril  20 mg Oral Daily  . methocarbamol  500 mg Oral BID  . metoCLOPramide (REGLAN) injection  10 mg Intravenous Q6H  . pantoprazole (PROTONIX) IV  40 mg Intravenous Q12H  . potassium chloride  40 mEq Oral BID  . vitamin B-6  200 mg Oral Daily  . sodium chloride flush  10-40 mL Intracatheter Q12H  . tamsulosin  0.4 mg Oral QPC supper  . vitamin C  500 mg Oral BID   Infusions:  . dextrose 5 % and 0.9 % NaCl with KCl 40 mEq/L 75 mL/hr at 04/02/18 0120  . heparin 1,050 Units/hr (04/02/18 0354)    Assessment: 70 YOF admitted with thoracic myelopathy  s/p T7-T8 tumor resection, currently NPO due to persistent ileus and cecum, found with acute DVT now on IV heparin in case of eventual GI procedure.  Heparin level remains therapeutic today, and is at the lower end of the therapeutic goal as requested by Dr. Alain Marion. Note she is having dark NG output. Currently her hemoglobin and platelet count are stable.   Goal of Therapy:  Heparin level 0.3-0.5 units/ml  - targeting low end of range per Dr. Alain Marion Monitor platelets by anticoagulation protocol: Yes   Plan:  Continue heparin gtt at 1050 units/hr Daily heparin level and CBC  Monitor s/sx of bleeding  Legrand Como, Pharm.D., BCPS, BCIDP Clinical Pharmacist Phone: [phone] or [phone] 04/02/2018, 8:16 AM

## 2018-04-03 ENCOUNTER — Inpatient Hospital Stay (HOSPITAL_COMMUNITY): Payer: 59 | Admitting: *Deleted

## 2018-04-03 ENCOUNTER — Encounter (HOSPITAL_COMMUNITY): Payer: 59 | Admitting: Psychology

## 2018-04-03 ENCOUNTER — Inpatient Hospital Stay (HOSPITAL_COMMUNITY): Payer: 59

## 2018-04-03 ENCOUNTER — Inpatient Hospital Stay (HOSPITAL_COMMUNITY): Payer: 59 | Admitting: Physical Therapy

## 2018-04-03 ENCOUNTER — Inpatient Hospital Stay (HOSPITAL_COMMUNITY): Payer: 59 | Admitting: Occupational Therapy

## 2018-04-03 LAB — CBC
HCT: 36.7 % (ref 36.0–46.0)
HCT: 37.3 % (ref 36.0–46.0)
Hemoglobin: 12.2 g/dL (ref 12.0–15.0)
Hemoglobin: 12.4 g/dL (ref 12.0–15.0)
MCH: 29.6 pg (ref 26.0–34.0)
MCH: 29.2 pg (ref 26.0–34.0)
MCHC: 33.2 g/dL (ref 30.0–36.0)
MCHC: 33.2 g/dL (ref 30.0–36.0)
MCV: 89.1 fL (ref 78.0–100.0)
MCV: 87.8 fL (ref 78.0–100.0)
Platelets: 139 10*3/uL — ABNORMAL LOW (ref 150–400)
Platelets: 125 10*3/uL — ABNORMAL LOW (ref 150–400)
RBC: 4.12 MIL/uL (ref 3.87–5.11)
RBC: 4.25 MIL/uL (ref 3.87–5.11)
RDW: 14.9 % (ref 11.5–15.5)
RDW: 14.6 % (ref 11.5–15.5)
WBC: 7.1 10*3/uL (ref 4.0–10.5)
WBC: 7.1 10*3/uL (ref 4.0–10.5)

## 2018-04-03 LAB — BASIC METABOLIC PANEL
Anion gap: 7 (ref 5–15)
BUN: 6 mg/dL (ref 6–20)
CO2: 26 mmol/L (ref 22–32)
Calcium: 8.7 mg/dL — ABNORMAL LOW (ref 8.9–10.3)
Chloride: 104 mmol/L (ref 101–111)
Creatinine, Ser: 0.43 mg/dL — ABNORMAL LOW (ref 0.44–1.00)
GFR calc Af Amer: >60 mL/min (ref >60)
GFR calc non Af Amer: >60 mL/min (ref >60)
Glucose, Bld: 98 mg/dL (ref 65–99)
Potassium: 3 mmol/L — ABNORMAL LOW (ref 3.5–5.1)
Sodium: 137 mmol/L (ref 135–145)

## 2018-04-03 LAB — HEPARIN LEVEL (UNFRACTIONATED)
Heparin Unfractionated: 0.53 IU/mL (ref 0.30–0.70)
Heparin Unfractionated: 0.68 IU/mL (ref 0.30–0.70)

## 2018-04-03 MED ORDER — POTASSIUM CHLORIDE 10 MEQ/100ML IV SOLN
10.0000 meq | Freq: Every day | INTRAVENOUS | Status: DC
  Administered 2018-04-03 – 2018-04-04 (×4): 10 meq via INTRAVENOUS
  Filled 2018-04-03 (×5): qty 100

## 2018-04-03 NOTE — Progress Notes (Signed)
Occupational Therapy Note  Patient Details  Name: Hannah Jefferson MRN: [id number] Date of Birth: 1956/01/15  Today's Date: 04/03/2018 OT Individual Time: 1300-1345 OT Individual Time Calculation (min): 45 min   Pt denies pain Individual Therapy  OT intervention with focus on bed mobility, sitting balance, and sit<>stand to increase independence with BADLs.  Pt asleep upon arrival but easily aroused.  Pt required mod A for supine>sit EOB.  Pt able to move BLE off edge of bed but required assistance with sidelying>sit EOB.  Pt required mod A for sit<>stand from EOB with Stedy and min A for sit<>stand from [employer] seat X 4.  Pt required max A for sitting>supine.  Pt able to assist with repositioning in bed by pushing with BLE to push up in bed.  Pt connected to NG tube/suction not allowing pt to transition to w/c. Pt remained in bed with sister-in-law present.    Leotis Shames [facility] 04/03/2018, 2:55 PM

## 2018-04-03 NOTE — Progress Notes (Signed)
[facility] Gastroenterology Progress Note  DOREATHA OFFER 62 y.o. [birth year]   Subjective: Feels better. Tolerating NGT clamping (has been clamped for the past 2 hours). Denies abdominal pain. Sister and friend in room. Nurse in room.  Objective: Vital signs: Vitals:   04/02/18 2106 04/03/18 0441  BP: (!) 158/99 (!) 152/90  Pulse: 97 82  Resp: 18 20  Temp: 98.3 F (36.8 C) 98.1 F (36.7 C)  SpO2: 96% 100%    Physical Exam: Gen: alert, no acute distress  HEENT: anicteric sclera CV: RRR Chest: CTA B Abd: minimal diffuse tenderness with guarding, mild distension, +BS  Lab Results: Recent Labs    04/01/18 0311 04/02/18 0342  NA 134* 140  K 3.3* 3.2*  CL 99* 106  CO2 24 25  GLUCOSE 158* 133*  BUN 7 10  CREATININE 0.57 0.50  CALCIUM 9.3 9.2   No results for input(s): AST, ALT, ALKPHOS, BILITOT, PROT, ALBUMIN in the last 72 hours. Recent Labs    03/31/18 1532  04/02/18 0342 04/03/18 0457  WBC 9.7   < > 9.7 7.1  NEUTROABS 8.3*  --   --   --   HGB 14.0   < > 13.4 12.2  HCT 40.6   < > 39.8 36.7  MCV 86.0   < > 88.2 89.1  PLT 243   < > 205 139*   < > = values in this interval not displayed.      Assessment/Plan: Ileus - receiving IV Potassium. Tolerating NG tube clamping with 100 cc dark bilious fluid out thus far this shift. Aggressively replace potassium and defer dosing to primary team. Too soon to remove NG tube but making progress. Continue supportive care. Will follow.   [place] C. 04/03/2018, 11:00 AM  Questions please call [phone] ID: Lavell Islam, female   DOB: 02-Jul-1956, 62 y.o.   MRN: [id number]

## 2018-04-03 NOTE — Progress Notes (Signed)
Physical Therapy Session Note  Patient Details  Name: Hannah Jefferson MRN: [id number] Date of Birth: 1956/08/16  Today's Date: 04/03/2018 PT Individual Time: 1638-4665 PT Individual Time Calculation (min): 56 min   Short Term Goals: Week 2:  PT Short Term Goal 1 (Week 2): Pt will demonstrate 1 method of pressure relief in w/c with min cues. PT Short Term Goal 2 (Week 2): Pt will propel w/c 25' with supervision. PT Short Term Goal 3 (Week 2): Pt will transition to standing with +1 assist in 25% of opportunities with LRAD.   PT Short Term Goal 4 (Week 2): Pt will tolerate supported standing x1 minute for decreased burden of care with ADLs PT Short Term Goal 5 (Week 2): Pt will self direct use of bed features for decreased burden of care with bed mobility.    Skilled Therapeutic Interventions/Progress Updates:  Pt received in bed with sister-in-law Hannah Jefferson) present for session. No c/o pain but 5/10 fatigue reported. Pt with incontinent BM & performed rolling in bed with cuing for technique to maintain back precautions & min assist. Therapist provided total assist for peri hygiene and donning clean brief. Reviewed energy conservation handout with pt and ways to manage fatigue, also discussed importance of taking breaks PRN prior to exhaustion. Pt willing to transfer out of bed & required mod assist for sidelying>sitting EOB. Therapist donned shoes & L aircast total assist. Pt able to maintain static sitting balance EOB with close supervision. Pt completes sit>stand from elevated bed and from steady with +2 assist. Pt transferred bed>w/c with stedy lift & encouraged to sit up in TIS w/c as long as possible. Reviewed pressure relief techniques (boosting) every 20-30 minutes with Hannah Jefferson & she verbalized understanding. Pt voiced nervousness regarding sitting in w/c as she sat in w/c on another date and was too fatigued to transfer out. Encouraged pt to sit up as long as possible but notify nursing staff of when  she wanted to return to bed and educated her on staff's ability to assist her back to bed with maximove if too fatigued to use stedy. At end of session pt left sitting in TIS w/c with quick release belt donned & in care of Hannah Jefferson in [place] made aware. Pt declined exercises 2/2 fatigue.   Therapy Documentation Precautions:  Precautions Precautions: Fall, Back Precaution Comments: LUE NWB in splint Restrictions Weight Bearing Restrictions: Yes LUE Weight Bearing: Non weight bearing   General: PT Amount of Missed Time (min): 19 Minutes PT Missed Treatment Reason: Patient fatigue   See Function Navigator for Current Functional Status.   Therapy/Group: Individual Therapy  Waunita Schooner 04/03/2018, 5:47 PM

## 2018-04-03 NOTE — Progress Notes (Signed)
ANTICOAGULATION CONSULT NOTE - Follow up [place] for heparin Indication: acute mobile DVT  No Known Allergies  Patient Measurements: Height: 5\' 4"  (162.6 cm) Weight: 224 lb 3.3 oz (101.7 kg) IBW/kg (Calculated) : 54.7 Heparin Dosing Weight: 78 kg  Vital Signs: Temp: 98.1 F (36.7 C) (05/20 0441) Temp Source: Oral (05/20 0441) BP: 152/90 (05/20 0441) Pulse Rate: 82 (05/20 0441)  Labs: Recent Labs    03/31/18 1532 03/31/18 1725 03/31/18 2146 04/01/18 0311 04/01/18 0500  04/02/18 0342 04/03/18 0457  HGB 14.0  --   --  14.5  --    < > 13.4 12.2  HCT 40.6  --   --  42.6  --   --  39.8 36.7  PLT 243  --   --  240  --   --  205 139*  HEPARINUNFRC  --   --   --   --  0.44  --  0.49 0.53  CREATININE 0.51 0.51  --  0.57  --   --  0.50  --   TROPONINI <0.03  --  <0.03 <0.03  --   --   --   --    < > = values in this interval not displayed.    Estimated Creatinine Clearance: 87.9 mL/min (by C-G formula based on SCr of 0.5 mg/dL).   Medical History: Past Medical History:  Diagnosis Date  . Chronic left hip pain   . GERD (gastroesophageal reflux disease)   . Gestational diabetes   . Hearing loss of both ears   . Hypertension   . Neuropathy    numbness/tingling from waist down  . Vision abnormalities     Medications:  Scheduled:  . calcium carbonate  1 tablet Oral TID WC  . feeding supplement  1 Container Oral TID BM  . lisinopril  20 mg Oral Daily  . methocarbamol  500 mg Oral BID  . metoCLOPramide (REGLAN) injection  10 mg Intravenous Q6H  . pantoprazole (PROTONIX) IV  40 mg Intravenous Q12H  . potassium chloride  40 mEq Oral BID  . vitamin B-6  200 mg Oral Daily  . sodium chloride flush  10-40 mL Intracatheter Q12H  . tamsulosin  0.4 mg Oral QPC supper  . vitamin C  500 mg Oral BID   Infusions:  . dextrose 5 % and 0.9 % NaCl with KCl 40 mEq/L 75 mL/hr at 04/02/18 1546  . heparin 1,050 Units/hr (04/03/18 0206)  . potassium chloride 10 mEq  (04/03/18 6629)    Assessment: 87 YOF admitted with thoracic myelopathy s/p T7-T8 tumor resection, currently NPO due to persistent ileus and cecum, found with acute DVT now on IV heparin in case of eventual GI procedure.  Heparin level slightly above goal today at 0.53.Currently her hemoglobin and platelet count are lower at 12.2 and 139. No bleeding noted this AM   Goal of Therapy:  Heparin level 0.3-0.5 units/ml  - targeting low end of range per Dr. Alain Marion Monitor platelets by anticoagulation protocol: Yes   Plan:  Decrease heparin gtt to 1000 units/hr Heparin level in 6 hours Daily heparin level and CBC  Monitor s/sx of bleeding  Adriene Padula A. Levada Dy, PharmD, [place] Pager: [phone] Phone [phone]  04/03/2018, 8:56 AM

## 2018-04-03 NOTE — Progress Notes (Signed)
ANTICOAGULATION CONSULT NOTE - Follow up [place] for heparin Indication: acute mobile DVT  No Known Allergies  Patient Measurements: Height: 5\' 4"  (162.6 cm) Weight: 224 lb 3.3 oz (101.7 kg) IBW/kg (Calculated) : 54.7 Heparin Dosing Weight: 78 kg  Vital Signs: Temp: 98.3 F (36.8 C) (05/20 1417) Temp Source: Oral (05/20 1417) BP: 140/104 (05/20 1417) Pulse Rate: 94 (05/20 1417)  Labs: Recent Labs    03/31/18 2146  04/01/18 0311  04/02/18 0342 04/03/18 0457 04/03/18 1207 04/03/18 1625  HGB  --   --  14.5   < > 13.4 12.2  --  12.4  HCT  --    < > 42.6  --  39.8 36.7  --  37.3  PLT  --    < > 240  --  205 139*  --  125*  HEPARINUNFRC  --   --   --    < > 0.49 0.53  --  0.68  CREATININE  --   --  0.57  --  0.50  --  0.43*  --   TROPONINI <0.03  --  <0.03  --   --   --   --   --    < > = values in this interval not displayed.    Estimated Creatinine Clearance: 87.9 mL/min (A) (by C-G formula based on SCr of 0.43 mg/dL (L)).   Medical History: Past Medical History:  Diagnosis Date  . Chronic left hip pain   . GERD (gastroesophageal reflux disease)   . Gestational diabetes   . Hearing loss of both ears   . Hypertension   . Neuropathy    numbness/tingling from waist down  . Vision abnormalities     Medications:  Scheduled:  . calcium carbonate  1 tablet Oral TID WC  . feeding supplement  1 Container Oral TID BM  . lisinopril  20 mg Oral Daily  . methocarbamol  500 mg Oral BID  . metoCLOPramide (REGLAN) injection  10 mg Intravenous Q6H  . pantoprazole (PROTONIX) IV  40 mg Intravenous Q12H  . potassium chloride  40 mEq Oral BID  . vitamin B-6  200 mg Oral Daily  . sodium chloride flush  10-40 mL Intracatheter Q12H  . tamsulosin  0.4 mg Oral QPC supper  . vitamin C  500 mg Oral BID   Infusions:  . dextrose 5 % and 0.9 % NaCl with KCl 40 mEq/L 75 mL/hr at 04/02/18 1546  . heparin 1,000 Units/hr (04/03/18 0949)  . potassium chloride 10 mEq  (04/03/18 1424)    Assessment: Hannah Jefferson admitted with thoracic myelopathy s/p T7-T8 tumor resection, currently NPO due to persistent ileus and cecum, found with acute DVT now on IV heparin in case of eventual GI procedure. -heparin level = 0.68 and trending up -Hg= 12.4, plt= 125 (noted 290 on the ist day of heparin start which was 5/11)  Goal of Therapy:  Heparin level 0.3-0.5 units/ml  - targeting low end of range per Dr. Alain Marion Monitor platelets by anticoagulation protocol: Yes   Plan:  -Decrease heparin to 900 units/hr -daily heparin level and CBC -Will check a heparin antibody  Hildred Laser, PharmD Clinical Pharmacist 04/03/2018 5:35 PM

## 2018-04-03 NOTE — Progress Notes (Signed)
[facility] PHYSICAL MEDICINE & REHABILITATION     PROGRESS NOTE    Subjective/Complaints: Pt has felt better over this weekend. Tolerated limited clears yesterday. Tube back to suction overnight. Denies belly pain  ROS: Patient denies fever, rash, sore throat, blurred vision, nausea, vomiting, diarrhea, cough, shortness of breath or chest pain,  , headache, or mood change.   Objective: Vital Signs: Blood pressure (!) 152/90, pulse 82, temperature 98.1 F (36.7 C), temperature source Oral, resp. rate 20, height 5\' 4"  (1.626 m), weight 101.7 kg (224 lb 3.3 oz), SpO2 100 %. No results found. Recent Labs    04/02/18 0342 04/03/18 0457  WBC 9.7 7.1  HGB 13.4 12.2  HCT 39.8 36.7  PLT 205 139*   Recent Labs    04/01/18 0311 04/02/18 0342  NA 134* 140  K 3.3* 3.2*  CL 99* 106  GLUCOSE 158* 133*  BUN 7 10  CREATININE 0.57 0.50  CALCIUM 9.3 9.2   CBG (last 3)  No results for input(s): GLUCAP in the last 72 hours.  Wt Readings from Last 3 Encounters:  03/21/18 101.7 kg (224 lb 3.3 oz)  03/20/18 93.4 kg (206 lb)  02/15/18 94.1 kg (207 lb 8 oz)    Physical Exam:  Constitutional: No distress . Vital signs reviewed. HEENT: EOMI, oral membranes moist. NGT Neck: supple Cardiovascular: RRR without murmur. No JVD    Respiratory: CTA Bilaterally without wheezes or rales. Normal effort    GI: less distended, faint bowel sounds heard Musculoskeletal: She exhibits noedema. Left arm in [facility]. Full movement of fingers.   Neurological: Motor RUE 5/5. LUE limited by splint can lift arm and wiggle all fingers. Decreased sensation to LT and proprioception, left greater than right brelow T8 dermatome. LE motor 2+ to 3/5 HF, KE and 3/5---motor/sensory stable.  ADF/PF. DTR's 1+. No resting tone.no change  Skin: Back incision CDI. Midline RUE, PICC LUE Psychiatric:  In good spirits today  Assessment/Plan: 1. Paraplegia and functional deficits secondary to T8 myelopathy, left  wrist fracture which require 3+ hours per day of interdisciplinary therapy in a comprehensive inpatient rehab setting. Physiatrist is providing close team supervision and 24 hour management of active medical problems listed below. Physiatrist and rehab team continue to assess barriers to discharge/monitor patient progress toward functional and medical goals.  Function:  Bathing Bathing position Bathing activity did not occur: N/A Position: Wheelchair/chair at sink  Bathing parts Body parts bathed by patient: Right arm, Left arm, Chest, Abdomen, Right upper leg, Left upper leg Body parts bathed by helper: Back, Left lower leg, Right lower leg  Bathing assist Assist Level: Assistive device, 2 helpers Assistive Device Comment: reacher    Upper Body Dressing/Undressing Upper body dressing   What is the patient wearing?: Hospital gown(due to IV ) Bra - Perfomed by patient: Thread/unthread left bra strap, Thread/unthread right bra strap Bra - Perfomed by helper: Hook/unhook bra (pull down sports bra) Pull over shirt/dress - Perfomed by patient: Thread/unthread right sleeve, Thread/unthread left sleeve, Put head through opening Pull over shirt/dress - Perfomed by helper: Put head through opening, Pull shirt over trunk Button up shirt - Perfomed by patient: Thread/unthread right sleeve, Thread/unthread left sleeve, Button/unbutton shirt Button up shirt - Perfomed by helper: Pull shirt around back    Upper body assist Assist Level: Touching or steadying assistance(Pt > 75%)      Lower Body Dressing/Undressing Lower body dressing Lower body dressing/undressing activity did not occur: N/A What is the  patient wearing?: Hospital Gown, Shoes       Pants- Performed by helper: Thread/unthread right pants leg, Thread/unthread left pants leg, Pull pants up/down   Non-skid slipper socks- Performed by helper: Don/doff right sock, Don/doff left sock       Shoes - Performed by helper: Don/doff right  shoe, Don/doff left shoe, Fasten right, Fasten left          Lower body assist Assist for lower body dressing: 2 Helpers      Naval architect activity did not occur: Safety/medical concerns   Toileting steps completed by helper: Adjust clothing prior to toileting, Performs perineal hygiene, Adjust clothing after toileting Toileting Assistive Devices: Other (comment)(stedy)  Toileting assist Assist level: Two helpers   Transfers Chair/bed transfer   Chair/bed transfer method: Other Chair/bed transfer assist level: 2 helpers Chair/bed transfer assistive device: Mechanical lift Mechanical lift: Ecologist Ambulation activity did not occur: Safety/medical concerns         Wheelchair   Type: Manual Max wheelchair distance: 150 Assist Level: Touching or steadying assistance (Pt > 75%)  Cognition Comprehension Comprehension assist level: Follows complex conversation/direction with no assist  Expression Expression assist level: Expresses complex ideas: With no assist  Social Interaction Social Interaction assist level: Interacts appropriately with others - No medications needed.  Problem Solving Problem solving assist level: Solves basic 75 - 89% of the time/requires cueing 10 - 24% of the time  Memory Memory assist level: Recognizes or recalls 50 - 74% of the time/requires cueing 25 - 49% of the time   Medical Problem List and Plan: 1.Paraplegia and funtional deficitssecondary to T8 meningioma/resection with myeopathy -recent left wrist fracture -continue PT, OT  2. DVT  Right common femoral vein (mobile), right posterior tibial and peroneal vein thrombi:  -continue IV heparin until ileus improves further     3. Pain Management:Hydrocodone prn 4. Mood:LCSW to follow for evaluation and support. 5. Neuropsych: This patientiscapable of making decisions on herown behalf. 6. Skin/Wound Care:Routine pressure  relief measures. 7. Fluids/Electrolytes/Nutrition:Monitor I/O.    -potassium 3.2 yesterday. No labs ordered for today---re order NOW lab    --BID labs  -all K+ through IV/IVF for now   -trial of clears 8. Post op ileus/neurogenic bowel:  -clear trial  -no KUB ordered today---order now  -continue NGT, clamp during day  -continue IV reglan  -encourage OOB to tolerance  -appreciate GI help 9.Neurogenic bladder:foley out today, voiding trial/caths  -E coli UTI--continue rocephin q24 completed 10. GERD:managed withPPIbid. 11. Hyponatremia: 133 today  -hctz stopped  -continue D5NS IV  12.Leftwrist fracture: NWB. [place]   LOS (Days) [place] EVALUATION WAS PERFORMED  Meredith Staggers, MD 04/03/2018 9:02 AM

## 2018-04-03 NOTE — Progress Notes (Signed)
Occupational Therapy Note  Patient Details  Name: Hannah Jefferson MRN: [id number] Date of Birth: July 21, 1956  Today's Date: 04/03/2018 OT Individual Time: 1030-1100 OT Individual Time Calculation (min): 30 min   Upon entering the room, pt supine in bed with caregiver present. Pt reports being fatigued from prior therapy sessions. OT provided pt and caregiver with education regarding energy conservation techniques for self care. OT provided paper handout as well and pt is very active participate in education. Pt asking appropriate questions as well. Pt remained supine at end of session with call bell and all needed items within reach upon exiting the room.      Darleen Crocker P 04/03/2018, 11:20 AM

## 2018-04-03 NOTE — Progress Notes (Signed)
Occupational Therapy Session Note  Patient Details  Name: Hannah Jefferson MRN: [id number] Date of Birth: 23-Sep-1956  Today's Date: 04/03/2018 OT Individual Time: 0700-0755 OT Individual Time Calculation (min): 55 min    Short Term Goals: Week 2:  OT Short Term Goal 1 (Week 2): Pt will be able to sit to stand with max A of 1 to prepare for LB self care. OT Short Term Goal 2 (Week 2): Pt will be able to squat pivot to w/c with max A of 1.  OT Short Term Goal 3 (Week 2): Pt will use AE to don pants over feet with mod A.  Skilled Therapeutic Interventions/Progress Updates:    Pt resting in bed upon arrival with family present.  Pt and sister-in-law requested that pt's schedule allow for rest breaks between sessions.  Pt and sister-in-law requested that therapy be reduced to 3 hours/day vs current 4 hours/day.  Both stated that 4 hours was "too exhausting" at this time. OT intervention with focus on bed mobility, sitting balance EOB, and sit<>stand/standing with Stedy.  Pt with c/o dizziness initially when sitting EOB but resolved within ~60 seconds.  Pt required max A for sit<>stand from EOB with Stedy and min A for sit<>stand from seat on Stedy.  Pt required max A for sit>supine and lateral scooting at EOB.  Pt currently has NG tube and two (2) PICC lines which inhibited dressing tasks this morning.  Pt remained in bed with all needs within reach and sister-in-law present.   Therapy Documentation Precautions:  Precautions Precautions: Fall, Back Precaution Comments: LUE NWB in splint Restrictions Weight Bearing Restrictions: Yes LUE Weight Bearing: Non weight bearing Pain:  Pt denies pain  See Function Navigator for Current Functional Status.   Therapy/Group: Individual Therapy  [name] 04/03/2018, 7:57 AM

## 2018-04-04 ENCOUNTER — Inpatient Hospital Stay (HOSPITAL_COMMUNITY): Payer: 59 | Admitting: Occupational Therapy

## 2018-04-04 ENCOUNTER — Inpatient Hospital Stay (HOSPITAL_COMMUNITY): Payer: 59

## 2018-04-04 ENCOUNTER — Inpatient Hospital Stay (HOSPITAL_COMMUNITY): Payer: 59 | Admitting: *Deleted

## 2018-04-04 ENCOUNTER — Inpatient Hospital Stay (HOSPITAL_COMMUNITY): Payer: 59 | Admitting: Physical Therapy

## 2018-04-04 LAB — CBC
HCT: 28.2 % — ABNORMAL LOW (ref 36.0–46.0)
HCT: 27.1 % — ABNORMAL LOW (ref 36.0–46.0)
HCT: 30.7 % — ABNORMAL LOW (ref 36.0–46.0)
Hemoglobin: 9.5 g/dL — ABNORMAL LOW (ref 12.0–15.0)
Hemoglobin: 9.1 g/dL — ABNORMAL LOW (ref 12.0–15.0)
Hemoglobin: 10.3 g/dL — ABNORMAL LOW (ref 12.0–15.0)
MCH: 30 pg (ref 26.0–34.0)
MCH: 29.8 pg (ref 26.0–34.0)
MCH: 29.4 pg (ref 26.0–34.0)
MCHC: 33.7 g/dL (ref 30.0–36.0)
MCHC: 33.6 g/dL (ref 30.0–36.0)
MCHC: 33.6 g/dL (ref 30.0–36.0)
MCV: 89 fL (ref 78.0–100.0)
MCV: 88.9 fL (ref 78.0–100.0)
MCV: 87.7 fL (ref 78.0–100.0)
Platelets: 84 10*3/uL — ABNORMAL LOW (ref 150–400)
Platelets: 72 10*3/uL — ABNORMAL LOW (ref 150–400)
Platelets: 91 10*3/uL — ABNORMAL LOW (ref 150–400)
RBC: 3.17 MIL/uL — ABNORMAL LOW (ref 3.87–5.11)
RBC: 3.05 MIL/uL — ABNORMAL LOW (ref 3.87–5.11)
RBC: 3.5 MIL/uL — ABNORMAL LOW (ref 3.87–5.11)
RDW: 14.6 % (ref 11.5–15.5)
RDW: 14.6 % (ref 11.5–15.5)
RDW: 14.4 % (ref 11.5–15.5)
WBC: 4.3 10*3/uL (ref 4.0–10.5)
WBC: 3.8 10*3/uL — ABNORMAL LOW (ref 4.0–10.5)
WBC: 4.7 10*3/uL (ref 4.0–10.5)

## 2018-04-04 LAB — BASIC METABOLIC PANEL
Anion gap: 7 (ref 5–15)
Anion gap: 7 (ref 5–15)
BUN: 7 mg/dL (ref 6–20)
BUN: 5 mg/dL — ABNORMAL LOW (ref 6–20)
CO2: 26 mmol/L (ref 22–32)
CO2: 24 mmol/L (ref 22–32)
Calcium: 8.9 mg/dL (ref 8.9–10.3)
Calcium: 8.6 mg/dL — ABNORMAL LOW (ref 8.9–10.3)
Chloride: 103 mmol/L (ref 101–111)
Chloride: 101 mmol/L (ref 101–111)
Creatinine, Ser: 0.45 mg/dL (ref 0.44–1.00)
Creatinine, Ser: 0.47 mg/dL (ref 0.44–1.00)
GFR calc Af Amer: >60 mL/min (ref >60)
GFR calc Af Amer: >60 mL/min (ref >60)
GFR calc non Af Amer: >60 mL/min (ref >60)
GFR calc non Af Amer: >60 mL/min (ref >60)
Glucose, Bld: 105 mg/dL — ABNORMAL HIGH (ref 65–99)
Glucose, Bld: 92 mg/dL (ref 65–99)
Potassium: 3 mmol/L — ABNORMAL LOW (ref 3.5–5.1)
Potassium: 3.5 mmol/L (ref 3.5–5.1)
Sodium: 136 mmol/L (ref 135–145)
Sodium: 132 mmol/L — ABNORMAL LOW (ref 135–145)

## 2018-04-04 LAB — HEPARIN LEVEL (UNFRACTIONATED): Heparin Unfractionated: 0.55 IU/mL (ref 0.30–0.70)

## 2018-04-04 LAB — HEPARIN INDUCED PLATELET AB (HIT ANTIBODY): Heparin Induced Plt Ab: 2.866 OD — ABNORMAL HIGH (ref 0.000–0.400)

## 2018-04-04 LAB — MAGNESIUM: Magnesium: 1.3 mg/dL — ABNORMAL LOW (ref 1.7–2.4)

## 2018-04-04 MED ORDER — GENERIC EXTERNAL MEDICATION
Status: DC
Start: ? — End: 2018-04-04

## 2018-04-04 MED ORDER — MAGNESIUM SULFATE 2 GM/50ML IV SOLN
2.0000 g | Freq: Once | INTRAVENOUS | Status: AC
  Administered 2018-04-04: 2 g via INTRAVENOUS
  Filled 2018-04-04: qty 50

## 2018-04-04 MED ORDER — POTASSIUM CHLORIDE 10 MEQ/100ML IV SOLN
10.0000 meq | INTRAVENOUS | Status: AC
  Administered 2018-04-04 (×5): 10 meq via INTRAVENOUS
  Filled 2018-04-04 (×5): qty 100

## 2018-04-04 NOTE — Progress Notes (Signed)
Occupational Therapy Session Note  Patient Details  Name: Hannah Jefferson MRN: [id number] Date of Birth: 05-12-1956  Today's Date: 04/04/2018 OT Individual Time: 0700-0800 OT Individual Time Calculation (min): 60 min    Short Term Goals: Week 2:  OT Short Term Goal 1 (Week 2): Pt will be able to sit to stand with max A of 1 to prepare for LB self care. OT Short Term Goal 2 (Week 2): Pt will be able to squat pivot to w/c with max A of 1.  OT Short Term Goal 3 (Week 2): Pt will use AE to don pants over feet with mod A.  Skilled Therapeutic Interventions/Progress Updates:    Pt resting in bed upon arrival.  Pt stated she was incontinent of bowel and required tot A for hygiene.  Pt able to roll in bed with min A to facilitate hygiene.  Pt required mod A for supine>sit EOB but was able to move BLE off edge of bed.  Pt required mod A for sit<>stand with Stedy for transfer to TIS w/c to complete UB bathing and grooming tasks.  Pt remained in w/c with all needs within reach and sister-in-law present. OT intervention with focus on activity tolerance, bed mobility, sit<>stand, BADL retraining, and safety awareness to increase independence with BADLs.   Therapy Documentation Precautions:  Precautions Precautions: Fall, Back Precaution Comments: LUE NWB in splint Restrictions Weight Bearing Restrictions: Yes LUE Weight Bearing: Non weight bearing Pain: Pain Assessment Pain Scale: 0-10 Pain Score: 0-No pain  See Function Navigator for Current Functional Status.   Therapy/Group: Individual Therapy  [name] 04/04/2018, 12:03 PM

## 2018-04-04 NOTE — Progress Notes (Signed)
ANTICOAGULATION CONSULT NOTE - Follow up [place] for heparin Indication: acute mobile DVT  No Known Allergies  Patient Measurements: Height: 5\' 4"  (162.6 cm) Weight: 224 lb 3.3 oz (101.7 kg) IBW/kg (Calculated) : 54.7 Heparin Dosing Weight: 78 kg  Vital Signs: Temp: 98 F (36.7 C) (05/21 0522) Temp Source: Oral (05/21 0522) BP: 129/71 (05/21 0522) Pulse Rate: 85 (05/21 0522)  Labs: Recent Labs    04/02/18 0342 04/03/18 0457 04/03/18 1207 04/03/18 1625 04/04/18 0509  HGB 13.4 12.2  --  12.4 9.5*  HCT 39.8 36.7  --  37.3 28.2*  PLT 205 139*  --  125* 84*  HEPARINUNFRC 0.49 0.53  --  0.68 0.55  CREATININE 0.50  --  0.43*  --  0.45    Estimated Creatinine Clearance: 87.9 mL/min (by C-G formula based on SCr of 0.45 mg/dL).   Medical History: Past Medical History:  Diagnosis Date  . Chronic left hip pain   . GERD (gastroesophageal reflux disease)   . Gestational diabetes   . Hearing loss of both ears   . Hypertension   . Neuropathy    numbness/tingling from waist down  . Vision abnormalities     Medications:  Scheduled:  . calcium carbonate  1 tablet Oral TID WC  . feeding supplement  1 Container Oral TID BM  . lisinopril  20 mg Oral Daily  . methocarbamol  500 mg Oral BID  . metoCLOPramide (REGLAN) injection  10 mg Intravenous Q6H  . pantoprazole (PROTONIX) IV  40 mg Intravenous Q12H  . potassium chloride  40 mEq Oral BID  . vitamin B-6  200 mg Oral Daily  . sodium chloride flush  10-40 mL Intracatheter Q12H  . tamsulosin  0.4 mg Oral QPC supper  . vitamin C  500 mg Oral BID   Infusions:  . dextrose 5 % and 0.9 % NaCl with KCl 40 mEq/L 75 mL/hr at 04/04/18 0528  . heparin 900 Units/hr (04/04/18 0523)  . magnesium sulfate 1 - 4 g bolus IVPB    . potassium chloride      Assessment: 80 YOF admitted with thoracic myelopathy s/p T7-T8 tumor resection, currently NPO due to persistent ileus and cecum, found with acute DVT now on IV  heparin in case of eventual GI procedure. -heparin level = 0.55 (Above range) -Hg= 9.5, plt= 84 (noted 290 on the ist day of heparin start which was 5/11). Large decrease in hemoglobin  Heparin antibody pending  Goal of Therapy:  Heparin level 0.3-0.5 units/ml  - targeting low end of range per Dr. Alain Marion Monitor platelets by anticoagulation protocol: Yes   Plan:  -Decrease heparin to 800 units/hr -Heparin level in 6 hours -daily heparin level and CBC   Shavaun Osterloh A. Levada Dy, PharmD, [place] Pager: [phone]  04/04/2018 7:48 AM

## 2018-04-04 NOTE — Progress Notes (Signed)
Occupational Therapy Session Note  Patient Details  Name: Hannah Jefferson MRN: [id number] Date of Birth: Apr 17, 1956  Today's Date: 04/04/2018 OT Missed Time: 42 Minutes Missed Time Reason: Patient on bedrest  Pt on bed rest. Will cont POC as pt medically stable and able to participate.   ADL: ADL ADL Comments: refer to functional navigator  See Function Navigator for Current Functional Status.   Therapy/Group: Individual Therapy  Lolah Coghlan L 04/04/2018, 7:15 AM

## 2018-04-04 NOTE — Progress Notes (Signed)
[facility] PHYSICAL MEDICINE & REHABILITATION     PROGRESS NOTE    Subjective/Complaints: Pt had a good night. Clears make her stomach feel a little upset. No belly pain this morning. Denies any dizziness/lightheadedness  ROS: Patient denies fever, rash, sore throat, blurred vision, nausea, vomiting, diarrhea, cough, shortness of breath or chest pain, joint or back pain, headache, or mood change.   Objective: Vital Signs: Blood pressure 129/71, pulse 85, temperature 98 F (36.7 C), temperature source Oral, resp. rate 15, height 5\' 4"  (1.626 m), weight 101.7 kg (224 lb 3.3 oz), SpO2 95 %. Dg Abd 1 View  Result Date: 04/03/2018 CLINICAL DATA:  Ileus.  NG tube in place. EXAM: ABDOMEN - 1 VIEW COMPARISON:  CT abdomen and pelvis 04/01/2018. FINDINGS: Diffuse gaseous distention of large and small bowel remains. NG tube is in the stomach. The stomach is collapsed. There is no free air. Distal colonic air is not clearly visualized. Levoconvex curvature is present in the lumbar spine. Heart is mildly enlarged. Lung bases are clear. IMPRESSION: 1. NG tube in place decompression of the stomach. 2. Persistent diffuse distention of large and small bowel compatible with ileus or distal obstruction. Electronically Signed   By: San Morelle M.D.   On: 04/03/2018 14:12   Recent Labs    04/03/18 1625 04/04/18 0509  WBC 7.1 4.3  HGB 12.4 9.5*  HCT 37.3 28.2*  PLT 125* 84*   Recent Labs    04/03/18 1207 04/04/18 0509  NA 137 136  K 3.0* 3.0*  CL 104 103  GLUCOSE 98 105*  BUN 6 7  CREATININE 0.43* 0.45  CALCIUM 8.7* 8.9   CBG (last 3)  No results for input(s): GLUCAP in the last 72 hours.  Wt Readings from Last 3 Encounters:  03/21/18 101.7 kg (224 lb 3.3 oz)  03/20/18 93.4 kg (206 lb)  02/15/18 94.1 kg (207 lb 8 oz)    Physical Exam:  Constitutional: No distress . Vital signs reviewed. HEENT: EOMI, oral membranes moist Neck: supple Cardiovascular: RRR without murmur. No  JVD    Respiratory: CTA Bilaterally without wheezes or rales. Normal effort    GI: BS +, non-tender,dec distention! Musculoskeletal: She exhibits noedema. Left arm in [facility]. Full movement of fingers.   Neurological: Motor RUE 5/5. LUE limited by splint can lift arm and wiggle all fingers. Decreased sensation to LT and proprioception, left greater than right brelow T8 dermatome. LE motor 2+ to 3/5 HF, KE and 3/5---motor/sensory stable.  ADF/PF. DTR's 1+. No resting tone.stable Skin: Back incision CDI. Midline RUE, PICC LUE Psychiatric:  In good spirits today  Assessment/Plan: 1. Paraplegia and functional deficits secondary to T8 myelopathy, left wrist fracture which require 3+ hours per day of interdisciplinary therapy in a comprehensive inpatient rehab setting. Physiatrist is providing close team supervision and 24 hour management of active medical problems listed below. Physiatrist and rehab team continue to assess barriers to discharge/monitor patient progress toward functional and medical goals.  Function:  Bathing Bathing position Bathing activity did not occur: N/A Position: Wheelchair/chair at sink  Bathing parts Body parts bathed by patient: Right arm, Left arm, Chest, Abdomen, Right upper leg, Left upper leg Body parts bathed by helper: Back, Left lower leg, Right lower leg  Bathing assist Assist Level: Assistive device, 2 helpers Assistive Device Comment: reacher    Upper Body Dressing/Undressing Upper body dressing   What is the patient wearing?: Hospital gown(due to IV ) Bra - Perfomed by patient:  Thread/unthread left bra strap, Thread/unthread right bra strap Bra - Perfomed by helper: Hook/unhook bra (pull down sports bra) Pull over shirt/dress - Perfomed by patient: Thread/unthread right sleeve, Thread/unthread left sleeve, Put head through opening Pull over shirt/dress - Perfomed by helper: Put head through opening, Pull shirt over trunk Button up shirt - Perfomed by  patient: Thread/unthread right sleeve, Thread/unthread left sleeve, Button/unbutton shirt Button up shirt - Perfomed by helper: Pull shirt around back    Upper body assist Assist Level: Touching or steadying assistance(Pt > 75%)      Lower Body Dressing/Undressing Lower body dressing Lower body dressing/undressing activity did not occur: N/A What is the patient wearing?: Hospital Gown, Shoes       Pants- Performed by helper: Thread/unthread right pants leg, Thread/unthread left pants leg, Pull pants up/down   Non-skid slipper socks- Performed by helper: Don/doff right sock, Don/doff left sock       Shoes - Performed by helper: Don/doff right shoe, Don/doff left shoe, Fasten right, Fasten left          Lower body assist Assist for lower body dressing: 2 Automotive engineer activity did not occur: Safety/medical concerns   Toileting steps completed by helper: Adjust clothing prior to toileting, Performs perineal hygiene, Adjust clothing after toileting Toileting Assistive Devices: Other (comment)(stedy)  Toileting assist Assist level: Two helpers   Transfers Chair/bed transfer   Chair/bed transfer method: Other Chair/bed transfer assist level: 2 helpers Chair/bed transfer assistive device: Mechanical lift Mechanical lift: Ecologist Ambulation activity did not occur: Safety/medical concerns         Wheelchair   Type: Manual Max wheelchair distance: 150 Assist Level: Touching or steadying assistance (Pt > 75%)  Cognition Comprehension Comprehension assist level: Follows complex conversation/direction with no assist  Expression Expression assist level: Expresses complex ideas: With no assist  Social Interaction Social Interaction assist level: Interacts appropriately with others - No medications needed.  Problem Solving Problem solving assist level: Solves basic 75 - 89% of the time/requires cueing 10 - 24% of the time   Memory Memory assist level: Recognizes or recalls 50 - 74% of the time/requires cueing 25 - 49% of the time   Medical Problem List and Plan: 1.Paraplegia and funtional deficitssecondary to T8 meningioma/resection with myeopathy -recent left wrist fracture -continue PT, OT, team conference 2. DVT  Right common femoral vein (mobile), right posterior tibial and peroneal vein thrombi:  -IV heparin ongoing  -platelets further decreased this morning. (85k) and hgb down to 9.5  -discussed with pharmacy this morning. I have requested a repeat CBC. If valid, we may have to consider IVCF    3. Pain Management:Hydrocodone prn 4. Mood:LCSW to follow for evaluation and support. 5. Neuropsych: This patientiscapable of making decisions on herown behalf. 6. Skin/Wound Care:Routine pressure relief measures. 7. Fluids/Electrolytes/Nutrition:Monitor I/O.    -potassium 3.0 today, run of 5KCl today  -mg 1.2---IVPB mg  --BID labs  --trial of clears 8. Post op ileus/neurogenic bowel: looks the best since she's been in hospital  -clear trials  -KUB pending  -continue NGT, clamping during day  -continue IV reglan  -encourage OOB to tolerance  -appreciate GI help 9.Neurogenic bladder:foley out today, voiding trial/caths  -E coli UTI--continue rocephin q24 completed 10. GERD:managed withPPIbid. 11. Hyponatremia: 136 today  -hctz stopped  -continue D5NS IV  12.Leftwrist fracture: NWB. [place]   LOS (Days) [place] EVALUATION WAS PERFORMED  Alroy Dust T  Naaman Plummer, MD 04/04/2018 8:25 AM

## 2018-04-04 NOTE — Progress Notes (Signed)
[facility] Gastroenterology Progress Note  Hannah Jefferson 62 y.o. 08-07-1956   Subjective: Doing better. Tolerating NGT periodic clamps. Denies abd pain. In wheelchair with PT and family present.  Objective: Vital signs: Vitals:   04/03/18 2023 04/04/18 0522  BP: (!) 151/90 129/71  Pulse: 77 85  Resp: 16 15  Temp:  98 F (36.7 C)  SpO2: 98% 95%   NG output of 300 cc overnight  Physical Exam: Gen: lethargic, no acute distress  HEENT: anicteric sclera CV: RRR Chest: CTA B Abd: softer, nondistended, nontender, decreased bowel sounds   Lab Results: Recent Labs    04/03/18 1207 04/04/18 0509  NA 137 136  K 3.0* 3.0*  CL 104 103  CO2 26 26  GLUCOSE 98 105*  BUN 6 7  CREATININE 0.43* 0.45  CALCIUM 8.7* 8.9  MG  --  1.3*   No results for input(s): AST, ALT, ALKPHOS, BILITOT, PROT, ALBUMIN in the last 72 hours. Recent Labs    04/04/18 0509 04/04/18 0848  WBC 4.3 3.8*  HGB 9.5* 9.1*  HCT 28.2* 27.1*  MCV 89.0 88.9  PLT 84* 72*      Assessment/Plan: Ileus slow to improve. Aggressive potassium supplements. Continue NGT clamp trials. Supportive care. Will sign off. Call if questions.   [place] C. 04/04/2018, 9:46 AM  Questions please call [phone] ID: Hannah Jefferson, female   DOB: [birth year], 62 y.o.   MRN: [id number]

## 2018-04-04 NOTE — Progress Notes (Signed)
Pharmacy Heparin Induced Thrombocytopenia (HIT) Note:  Hannah Jefferson is a 62 y.o. female being evaluated for HIT. Heparin was started 03/26/18 (also noted on lovenox 40mg  q24hr from 5/7-5/11) for acute VTE, and baseline platelets were 290K on 5/11. HIT labs were ordered on 04/03/18 when platelets dropped to 84K.   Heparin Induced Plt Ab  Date/Time Value Ref Range Status  04/03/2018 06:12 PM 2.866 (H) 0.000 - 0.400 OD Final    Comment:    (NOTE) Performed At: [facility] [place], [place] [id number] Rush Farmer MD NO:[id number] Performed at [facility], [place] [address] [place], [place] [address]      Score = 0 Score = 1 Score = 2 Calculated Score  Thrombocytopenia -Platelet fall < 30% -Any platelet fall with nadir < 10 -Platelet fall >50% BUT surgery within 3 days  -Any combination of platelet fall/nadir that does not fit score 0 or 2 -Platelet fall >50% AND nadir ?20 AND no surgery within 3 days   2  Timing -Platelets fall on days ? 4  AND no prior heparin exposure in previous 100 days -Consistent w/ platelet fall on days 5-10 but missing counts -Platelet fall within 1 day of start AND prior exposure to heparin within previous 31-100 days -Platelet fall after day 10 -Platelet fall 5-10 days after starting heparin -Platelet fall within 1 day of heparin start AND prior exposure within previous 5-30 days 2  Thrombosis -None suspected -Recurrent thrombosis in patient receiving therapeutic anticoagulants -Suspected thrombosis (awaiting confirmation via imaging) -Erythematous skin lesions at heparin injection sites -Confirmed new thrombosis -Skin necrosis at injection site -Anaphylactoid reaction to heparin IV bolus -Adrenal hemorrhage 2 (noted on 5/11)  oTher Causes -Probable other cause (see protocol for full list) -Possible other cause evident (see protocol for full list) -No alternative explanation 1  Total Calculated 4T Score:      7   Name of MD  Contacted: Marlowe Sh[place]  Algorithm Recommendation: Possible HIT:  Order SRA.  Initiate alternative anticoagulant.  Document heparin allergy.  Heparin has already been stopped due to Hg drop and plans are for IVC placement  Plan: -No anticoagulation due to recent Hg drop -Will confirm with an SRA and document a heparin allergy if found to be positive  Hildred Laser, PharmD Clinical Pharmacist  04/04/2018 6:54 PM

## 2018-04-04 NOTE — Progress Notes (Addendum)
Hemoglobin continues to trend down 9.1 platelets 72,000 from hemoglobin of 12.4.  Patient had been on IV heparin for right common femoral vein, right posterior tibial and peroneal vein DVT.  Will discontinue heparin at this time.  Consult interventional radiology for IVCF.  Continue to monitor for other sources of bleeding.  I have spoke with P.A. Regarding the above. Pt placed on bedrest. Continue to monitor clinically. Await IVCF placement. Re-check cbc at 1300 today.    Meredith Staggers, MD, [place]

## 2018-04-04 NOTE — Patient Care Conference (Signed)
Inpatient RehabilitationTeam Conference and Plan of Care Update Date: 04/04/2018   Time: 2:10 pm    Patient Name: Hannah Jefferson      Medical Record Number: [id number]  Date of Birth: 04-12-1956 Sex: Female         Room/Bed: 4W11C/4W11C-01 Payor Info: Payor: Theme park manager / Plan: Theme park manager OTHER / Product Type: *No Product type* /    Admitting Diagnosis: T7 8 MENOGIONE  RESECTION  Admit Date/Time:  03/21/2018  1:32 PM Admission Comments: No comment available   Primary Diagnosis:  Thoracic myelopathy Principal Problem: Thoracic myelopathy  Patient Active Problem List   Diagnosis Date Noted  . Ileus, postoperative ([place])   . Thoracic myelopathy 03/21/2018  . Neurogenic bowel 03/21/2018  . Neurogenic bladder 03/21/2018  . Left wrist fracture, sequela 03/21/2018  . Numbness 02/15/2018  . Ataxic gait 02/15/2018  . Left leg weakness 02/15/2018  . Other fatigue 02/15/2018  . Urinary incontinence 02/15/2018  . Bowel incontinence 02/15/2018    Expected Discharge Date: Expected Discharge Date: (2-3 weeks)  Team Members Present: Physician leading conference: Dr. Alger Simons Social Worker Present: Lennart Pall, LCSW Nurse Present: Other (comment)(Caroline Burchett, RN) PT Present: Dwyane Dee, PT OT Present: Roanna Epley, Verdie Mosher, OT SLP Present: Charolett Bumpers, SLP PPS Coordinator present : Ileana Ladd, PT     Current Status/Progress Goal Weekly Team Focus  Medical   Ongoing ileus.  Is showing some improvement this week but continued hypokalemia and hypomagnesia.  Patient now showing adverse effects to IV heparin with decreasing platelets and hemoglobin  Stabilized medically for participation in therapy  Management of medical issues noted above   Bowel/Bladder   incontinent b/b; LBM 5/21; ileus; NG tube to Low wall suction, continuous, coffee ground output  bedpan; bowel program  assess qshift and PRN   Swallow/Nutrition/ Hydration              ADL's   bathing sitting EOB/bed level-max A/mod A; wearing gowns at present secondary to medical; sitting balance-min A; bed mobility mod/max A  min A overall with LB self care and transfers, s/u UB self care  activity tolerance, bed mobility, sitting balance, sit<>stand, BADL retraining, educaiton   Mobility   continues to be on/off bedrest, which is a challenge, hopefully will improve with IVC filter  min assist w/c level transfers; mod assist controlled environment gait goals; mod I w/c mobility  increasing OOB tolerance/endurance, functional transfers, strengthening, NMR, SCI education   Communication             Safety/Cognition/ Behavioral Observations            Pain   patient denies pain  pain 3/10  assess qshift and prn; medicate PRN and monitor for effectiveness   Skin   MASD to perineum, buttocks, open wound to buttocks, incision mid back; infection free  remain infection free; free from further breakdown  assess q shift and PRN    Rehab Goals Patient on target to meet rehab goals: No Rehab Goals Revised: Medical complications continue with frequent "bed rest" days which are affecting therapies *See Care Plan and progress notes for long and short-term goals.     Barriers to Discharge  Current Status/Progress Possible Resolutions Date Resolved   Physician    Medical stability        IVC filter to be placed to allow patient to avoid anticoagulation      Nursing  PT                    OT                  SLP                SW                Discharge Planning/Teaching Needs:  Pt to d/c home with spouse and sister-in-law providing 24/7 assistance.  Sister-in-law present for some sessions - teaching continues.   Team Discussion:  Ongoing medical issues and on bed rest once again;  Plans for IVC filter placement. Monitoring labs very closely.  Actually did really well with OT this morning until labs returned and bedrest ordered.  Pt remains positive  and wants to do the therapies.  Team providing ongoing emotional support.  Revisions to Treatment Plan:  Bed rest currently.    Continued Need for Acute Rehabilitation Level of Care: The patient requires daily medical management by a physician with specialized training in physical medicine and rehabilitation for the following conditions: Daily direction of a multidisciplinary physical rehabilitation program to ensure safe treatment while eliciting the highest outcome that is of practical value to the patient.: Yes Daily medical management of patient stability for increased activity during participation in an intensive rehabilitation regime.: Yes Daily analysis of laboratory values and/or radiology reports with any subsequent need for medication adjustment of medical intervention for : Neurological problems;Other  Hannah Jefferson, [place] 04/04/2018, 5:00 PM

## 2018-04-04 NOTE — Consult Note (Signed)
Chief Complaint: Patient was seen in consultation today for IVC filter placement  Referring Physician(s): Dr. Naaman Plummer  Supervising Physician: Arne Cleveland  Patient Status: [facility] - In-pt  History of Present Illness: Hannah Jefferson is a 62 y.o. female with past medical history of paraplegia and functional deficits secondary to T8 myelopathy, left wrist fracture, and ileus with NGT in place who was recently found to have DVT of the right common femoral vein, right posterior tibial and peroneal veins. Patient has been maintained on heparin for the past several days, however her Hgb and platelets have trended downward.  IR consulted for IVC filter placement as heparin will be stopped.  Case reviewed and approved by Dr. Vernard Gambles.   Past Medical History:  Diagnosis Date  . Chronic left hip pain   . GERD (gastroesophageal reflux disease)   . Gestational diabetes   . Hearing loss of both ears   . Hypertension   . Neuropathy    numbness/tingling from waist down  . Vision abnormalities     Past Surgical History:  Procedure Laterality Date  . LAPAROSCOPIC CHOLECYSTECTOMY  1992  . REFRACTIVE SURGERY Bilateral 2004    Allergies: Patient has no known allergies.  Medications: Prior to Admission medications   Medication Sig Start Date End Date Taking? Authorizing Provider  acetaminophen (TYLENOL) 500 MG tablet Take 1,000 mg by mouth daily as needed for mild pain.   Yes [provider]  bisacodyl (DULCOLAX) 10 MG suppository Place 10 mg rectally as needed for moderate constipation.   Yes [provider]  calcium carbonate (TUMS - DOSED IN MG ELEMENTAL CALCIUM) 500 MG chewable tablet Chew 1 tablet by mouth 3 (three) times daily as needed for indigestion or heartburn.   Yes [provider]  dexamethasone (DECADRON) 4 MG tablet Take 3 po qd Patient taking differently: Take 1-3 mg by mouth See admin instructions. Take three tablets three times daily with meals  for three days through Tuesday 03-21-18. Then take two tablets three times daily with meals on 03-22-18. Then take one tablet three times daily with meals on 03-23-18. Then take one tablet twice daily with meals on 03-24-18. Then STOP. 02/21/18  Yes Sater, Nanine Means, MD  gabapentin (NEURONTIN) 300 MG capsule Take 300 mg by mouth 3 (three) times daily.   Yes [provider]  HYDROcodone-acetaminophen (NORCO/VICODIN) 5-325 MG tablet Take 1-2 tablets by mouth every 4 (four) hours as needed for moderate pain (one tablet for moderate pain and two tablets for severe pain).   Yes [provider]  lisinopril-hydrochlorothiazide (PRINZIDE,ZESTORETIC) 20-12.5 MG tablet Take one tablet by mouth once daily. 02/06/18  Yes [provider]  methocarbamol (ROBAXIN) 500 MG tablet Take 500 mg by mouth 2 (two) times daily. For ten days   Yes [provider]  omeprazole (PRILOSEC) 20 MG capsule Take 20 mg by mouth 2 (two) times daily before a meal.   Yes [provider]  polyethylene glycol (MIRALAX / GLYCOLAX) packet Take 17 g by mouth daily as needed for mild constipation.   Yes [provider]  pyridoxine (B-6) 200 MG tablet Take 200 mg by mouth daily.   Yes [provider]  senna (SENOKOT) 8.6 MG TABS tablet Take 2 tablets by mouth See admin instructions. Twice daily while taking Norco to avoid constipation.   Yes [provider]  tamsulosin (FLOMAX) 0.4 MG CAPS capsule Take 0.4 mg by mouth daily after supper.   Yes [provider]  vitamin C (  ASCORBIC ACID) 500 MG tablet Take 500 mg by mouth 2 (two) times daily. 03/08/18  Yes [provider]  oxybutynin (DITROPAN) 5 MG tablet Take 1 tablet (5 mg total) by mouth 2 (two) times daily. Patient not taking: Reported on 03/21/2018 02/15/18   Britt Bottom, MD     Family History  Problem Relation Age of Onset  . Dementia Mother   . COPD Mother   . Heart disease Father   . Thyroid nodules  Sister   . Diabetes Mellitus II Sister   . Heart disease Brother   . Diabetes Mellitus II Brother   . Diabetes Mellitus II Brother   . Heart disease Brother     Social History   Socioeconomic History  . Marital status: Married    Spouse name: Not on file  . Number of children: Not on file  . Years of education: Not on file  . Highest education level: Not on file  Occupational History  . Not on file  Social Needs  . Financial resource strain: Not on file  . Food insecurity:    Worry: Not on file    Inability: Not on file  . Transportation needs:    Medical: Not on file    Non-medical: Not on file  Tobacco Use  . Smoking status: Never Smoker  . Smokeless tobacco: Never Used  Substance and Sexual Activity  . Alcohol use: Never    Frequency: Never  . Drug use: Never  . Sexual activity: Not on file  Lifestyle  . Physical activity:    Days per week: Not on file    Minutes per session: Not on file  . Stress: Not on file  Relationships  . Social connections:    Talks on phone: Not on file    Gets together: Not on file    Attends religious service: Not on file    Active member of club or organization: Not on file    Attends meetings of clubs or organizations: Not on file    Relationship status: Not on file  Other Topics Concern  . Not on file  Social History Narrative  . Not on file     Review of Systems: A 12 point ROS discussed and pertinent positives are indicated in the HPI above.  All other systems are negative.  Review of Systems  Constitutional: Negative for fatigue and fever.  Respiratory: Negative for cough and shortness of breath.   Cardiovascular: Negative for chest pain.  Gastrointestinal: Positive for abdominal distention, abdominal pain, nausea and vomiting.  Psychiatric/Behavioral: Negative for behavioral problems and confusion.    Vital Signs: BP (!) 137/93 (BP Location: Right Arm)   Pulse 85   Temp 97.9 F (36.6 C) (Oral)   Resp 18   Ht 5'  4" (1.626 m)   Wt 224 lb 3.3 oz (101.7 kg)   SpO2 99%   BMI 38.49 kg/m   Physical Exam  Constitutional: She is oriented to person, place, and time. She appears well-developed.  Cardiovascular: Normal rate, regular rhythm and normal heart sounds.  Pulmonary/Chest: Effort normal and breath sounds normal. No respiratory distress.  Abdominal: She exhibits distension.  NGT in place  Neurological: She is alert and oriented to person, place, and time.  Skin: Skin is warm and dry.  Psychiatric: She has a normal mood and affect. Her behavior is normal. Judgment and thought content normal.  Nursing note and vitals reviewed.    MD Evaluation Airway: WNL Heart: WNL  Abdomen: Other (comments) Abdomen comments: distention, NGT in place Chest/ Lungs: WNL ASA  Classification: 3 Mallampati/Airway Score: One   Imaging: Ct Abdomen Pelvis Wo Contrast  Result Date: 03/27/2018 CLINICAL DATA:  Chronic ileus postop back surgery. Abdominal discomfort and thoracic spasms. Moderate abdominal distension EXAM: CT ABDOMEN AND PELVIS WITHOUT CONTRAST TECHNIQUE: Multidetector CT imaging of the abdomen and pelvis was performed following the standard protocol without IV contrast. COMPARISON:  Plain film of the abdomen from earlier same day. FINDINGS: Lower chest: No acute abnormality.  Small hiatal hernia. Hepatobiliary: No focal liver abnormality is seen. Status post cholecystectomy. No biliary dilatation. Pancreas: Unremarkable. No pancreatic ductal dilatation or surrounding inflammatory changes. Spleen: Normal in size without focal abnormality. Adrenals/Urinary Tract: Adrenal glands appear normal. Kidneys are unremarkable without mass, stone or hydronephrosis. No ureteral or bladder calculi identified. Bladder is unremarkable. Small amount of air within the bladder is presumably related to recent catheterization. Stomach/Bowel: Fluid and gas throughout the colon, mildly to moderately distended throughout. Distal  small bowel is upper normal in caliber, oral contrast present to the level of the ileum. Perhaps a small amount of oral contrast layering within the cecum. Enteric tube in place with tip adequately positioned at the level of the proximal duodenum. Vascular/Lymphatic: No significant vascular findings are present. No enlarged abdominal or pelvic lymph nodes. Reproductive: Uterus and bilateral adnexa are unremarkable. Other: No free fluid or abscess collection. No free intraperitoneal air. Musculoskeletal: No acute appearing osseous abnormality. Moderate scoliosis of the thoracolumbar spine. Chronic bilateral pars interarticularis defects at L5-S1 with grade 1/2 spondylolisthesis of L5. IMPRESSION: 1. Findings are most compatible with postoperative ileus. No evidence of a mechanical bowel obstruction identified. Recommend follow-up plain film examinations to ensure resolution, and to visualize the eventual passage of the oral contrast to the large bowel (there is likely a small amount of layering oral contrast within the cecum). Enteric tube adequately positioned with tip at the level of the duodenal bulb. 2. Small hiatal hernia. Electronically Signed   By: Franki Cabot M.D.   On: 03/27/2018 19:29   Dg Wrist 2 Views Left  Result Date: 03/24/2018 CLINICAL DATA:  Left wrist fracture. EXAM: LEFT WRIST - 2 VIEW COMPARISON:  None. FINDINGS: Frontal and lateral views of the left wrist were obtained with the wrist in a fiberglass cast. Screw and plate fixation of the distal radius, bridging a poorly visualized probable fracture of the proximal metaphysis. There is also a probable fracture of the ulnar styloid with distal end radial displacement of distal fragments. IMPRESSION: Hardware fixation of a distal radius fracture with anatomic position and alignment and poorly visualized ulnar styloid fracture. Electronically Signed   By: Claudie Revering M.D.   On: 03/24/2018 20:13   Dg Abd 1 View  Result Date:  04/04/2018 CLINICAL DATA:  Follow-up ileus EXAM: ABDOMEN - 1 VIEW COMPARISON:  Abdominal radiographs from 1 day prior FINDINGS: Enteric tube terminates in the very proximal stomach, with slight interval retraction of the tube. Stomach appears decompressed. Mildly dilated small bowel loops in the central abdomen, mildly decreased in caliber. Prominent gaseous distention of the right and transverse colon, slightly improved in the transverse colon and slightly worsened in the cecum. Surgical clip overlies the deep pelvis. No evidence of pneumatosis or pneumoperitoneum. Clear lung bases. No radiopaque nephrolithiasis. IMPRESSION: 1. Enteric tube terminates in the very proximal stomach, with slight interval retraction of the tube. Stomach appears decompressed. 2. Persistent radiographic evidence of adynamic ileus, improved in the small bowel  and transverse colon and slightly worsened in the cecum. Electronically Signed   By: Ilona Sorrel M.D.   On: 04/04/2018 09:30   Dg Abd 1 View  Result Date: 04/03/2018 CLINICAL DATA:  Ileus.  NG tube in place. EXAM: ABDOMEN - 1 VIEW COMPARISON:  CT abdomen and pelvis 04/01/2018. FINDINGS: Diffuse gaseous distention of large and small bowel remains. NG tube is in the stomach. The stomach is collapsed. There is no free air. Distal colonic air is not clearly visualized. Levoconvex curvature is present in the lumbar spine. Heart is mildly enlarged. Lung bases are clear. IMPRESSION: 1. NG tube in place decompression of the stomach. 2. Persistent diffuse distention of large and small bowel compatible with ileus or distal obstruction. Electronically Signed   By: San Morelle M.D.   On: 04/03/2018 14:12   Dg Abd 1 View  Result Date: 03/31/2018 CLINICAL DATA:  Nasogastric catheter placement, ileus EXAM: ABDOMEN - 1 VIEW COMPARISON:  03/30/2018 FINDINGS: Nasogastric catheter is noted to be within the stomach following fluoroscopic placement. 1 minutes and 12 seconds of  fluoroscopy was utilized. IMPRESSION: Nasogastric catheter within the stomach. Electronically Signed   By: Inez Catalina M.D.   On: 03/31/2018 09:15   Dg Abd 1 View  Result Date: 03/30/2018 CLINICAL DATA:  Ileus. EXAM: ABDOMEN - 1 VIEW COMPARISON:  03/29/2018. FINDINGS: NG tube not noted on today's exam. Persistent distended loops of colon and small bowel noted. Findings most consistent adynamic ileus. Follow-up exam suggested to demonstrate resolution and to exclude obstruction. Surgical clips right upper quadrant and pelvis. Pelvic calcifications consistent phleboliths. Lumbar spine scoliosis concave right. IMPRESSION: NG tube not visualized on today's exam. Persistent distended loops of colon and small bowel. Findings most consistent with persistent adynamic ileus. Similar findings noted on prior exam. Follow-up exams to demonstrate resolution suggested. Electronically Signed   By: Marcello Moores  Register   On: 03/30/2018 12:31   Dg Abd 1 View  Result Date: 03/28/2018 CLINICAL DATA:  Ileus, abdominal distention EXAM: ABDOMEN - 1 VIEW COMPARISON:  03/27/2018 FINDINGS: NG tube tip in the distal stomach or proximal duodenum. Prior cholecystectomy. Diffuse gaseous distention of bowel is stable compatible with ileus. No free air or organomegaly. IMPRESSION: Stable diffuse gaseous distention of bowel most compatible with ileus. Electronically Signed   By: Rolm Baptise M.D.   On: 03/28/2018 08:59   Dg Abd 1 View  Result Date: 03/27/2018 CLINICAL DATA:  Ileus EXAM: ABDOMEN - 1 VIEW COMPARISON:  03/26/2018 FINDINGS: NG in the proximal stomach near the GE junction. Recommend advancing into the stomach. Surgical clips in the gallbladder fossa Progressive dilatation of large and small bowel. Lumbar scoliosis.  No renal calculi. IMPRESSION: Interval progression of large and small bowel dilatation most likely ileus versus distal small bowel obstruction. NG tube in the proximal stomach, recommend advancing NG tube.  Electronically Signed   By: Franchot Gallo M.D.   On: 03/27/2018 07:17   Dg Abd 1 View  Result Date: 03/26/2018 CLINICAL DATA:  Ileus EXAM: ABDOMEN - 1 VIEW COMPARISON:  03/25/2018 FINDINGS: Tip of nasogastric tube projects over proximal stomach. Persisting gaseous distention of large and small bowel loops throughout abdomen. No bowel wall thickening or free air. Decreased cecal distention and increased small bowel distention since previous exam. Lung bases clear. Bones demineralized with levoconvex thoracolumbar scoliosis. IMPRESSION: Persistent gaseous distention of large and small bowel loops in abdomen consistent with ileus, slightly increased. Electronically Signed   By: Lavonia Dana M.D.   On:  03/26/2018 08:21   Dg Abd 1 View  Result Date: 03/24/2018 CLINICAL DATA:  Ileus. EXAM: ABDOMEN - 1 VIEW; DG NASO G TUBE PLC W/FL-NO RAD CONTRAST:  None. FLUOROSCOPY TIME:  Fluoroscopy Time:  6 seconds. Radiation Exposure Index (if provided by the fluoroscopic device): 2.3 mGy. Number of Acquired Spot Images: 0 COMPARISON:  Abdominal x-ray from same day at 7:44 a.m. FINDINGS: Nasogastric tube within a large hiatal hernia. Diffuse gaseous distention of small bowel and colon is unchanged. IMPRESSION: Nasogastric tube placed within a large hiatal hernia. Electronically Signed   By: Titus Dubin M.D.   On: 03/24/2018 15:40   Dg Abd 1 View  Result Date: 03/24/2018 CLINICAL DATA:  Abdominal distension and pain. EXAM: ABDOMEN - 1 VIEW COMPARISON:  03/23/2018 and prior radiograph FINDINGS: Gaseous distension small bowel and colon again identified with the cecum measuring up to 13 cm. Slightly increased small bowel distension noted. No other changes identified. IMPRESSION: Slightly increased bowel distension/ileus. Electronically Signed   By: Margarette Canada M.D.   On: 03/24/2018 12:09   Dg Abd 1 View  Result Date: 03/23/2018 CLINICAL DATA:  Ileus. EXAM: ABDOMEN - 1 VIEW COMPARISON:  Radiograph of Mar 22, 2018.  FINDINGS: Stable dilated large and small bowel loops are noted most consistent with ileus. Cecum currently measures 12.3 cm which is not significantly changed compared to prior exam. Status post cholecystectomy. Phleboliths are noted in the pelvis. IMPRESSION: Stable ileus as described above. Electronically Signed   By: Marijo Conception, M.D.   On: 03/23/2018 10:49   Dg Abd 1 View  Result Date: 03/22/2018 CLINICAL DATA:  Follow-up generalized ileus.  LOWER abdominal pain. EXAM: ABDOMEN - 1 VIEW COMPARISON:  03/21/2018. FINDINGS: Persistent gaseous distension of the colon and several loops of small bowel throughout the abdomen. The cecum and proximal ascending colon are the most distended segment, with the cecum measuring approximately 13 cm diameter, unchanged. No evidence of free intraperitoneal air on the supine image. Surgical clips in the RIGHT UPPER quadrant from prior cholecystectomy. Phleboliths in the LEFT side of the low pelvis. Degenerative changes involving the symphysis pubis. Thoracolumbar scoliosis with degenerative changes throughout the lumbar spine. IMPRESSION: Stable severe ileus, with marked gaseous distension of the cecum up to approximately 13 cm (unchanged since yesterday). Electronically Signed   By: Evangeline Dakin M.D.   On: 03/22/2018 10:06   Ct Abdomen Pelvis W Contrast  Result Date: 04/01/2018 CLINICAL DATA:  Abdominal distention, post tumor resection from back. EXAM: CT ABDOMEN AND PELVIS WITH CONTRAST TECHNIQUE: Multidetector CT imaging of the abdomen and pelvis was performed using the standard protocol following bolus administration of intravenous contrast. CONTRAST:  139m OMNIPAQUE IOHEXOL 300 MG/ML  SOLN COMPARISON:  03/27/2018 CT, abdomen radiographs from 03/31/2018 and CXR from 03/31/2018 FINDINGS: Lower chest: Borderline cardiomegaly. PICC line tip noted in the right atrium. There are trace bilateral pleural effusions with atelectasis. Hepatobiliary: No focal liver  abnormality is seen. Status post cholecystectomy. No biliary dilatation. Pancreas: Unremarkable. No pancreatic ductal dilatation or surrounding inflammatory changes. Spleen: Normal in size without focal abnormality. Adrenals/Urinary Tract: Adrenal glands are unremarkable. Kidneys are normal, without renal calculi, focal lesion, or hydronephrosis. Bladder is unremarkable. Stomach/Bowel: Decompressed stomach with gastric tube in place. Contrast distended small bowel re-demonstrated without mechanical bowel obstruction compatible with postoperative ileus. Fluid-filled large bowel loops without contrast is noted to the level of the rectum. Vascular/Lymphatic: No significant vascular findings are present. No enlarged abdominal or pelvic lymph nodes. Reproductive: Uterus and  bilateral adnexa are unremarkable. Other: No abdominopelvic ascites or free intraperitoneal air. Musculoskeletal: Postsurgical change involving posterior elements from T7 through T8. Levoconvex curvature of the lumbar spine. Chronic spondylolisthesis of L5 with grade 1 bordering grade 2 spondylolisthesis of L5 on S1. Degenerative disc disease L2-3 and L5-S1. IMPRESSION: 1. Persistent small bowel ileus with enteric contrast noted to the level of the mid to distal ileum. No mechanical source of bowel obstruction. 2. Postop change of the lower thoracic spine. 3. Trace bilateral pleural effusions with atelectasis. Electronically Signed   By: Ashley Royalty M.D.   On: 04/01/2018 01:11   Dg Chest Port 1v Same Day  Result Date: 03/31/2018 CLINICAL DATA:  Chest pain abdominal pain EXAM: PORTABLE CHEST 1 VIEW COMPARISON:  None. FINDINGS: NG in the stomach.  Left arm PICC tip in the right atrium. Heart size normal. Negative for heart failure. Minimal right lower lobe atelectasis. No effusion. IMPRESSION: PICC tip in the right atrium Minimal right lower lobe atelectasis. Electronically Signed   By: Franchot Gallo M.D.   On: 03/31/2018 16:05   Dg Abd Portable  1v  Result Date: 03/31/2018 CLINICAL DATA:  Abdominal pain EXAM: PORTABLE ABDOMEN - 1 VIEW COMPARISON:  03/30/2018 and 03/31/2018 FINDINGS: NG remains in the stomach. Diffuse large and small bowel dilatation unchanged. Findings compatible with ileus which is predominately involving the colon. Surgical clips in the gallbladder fossa.  Lumbar scoliosis IMPRESSION: NG remains in the stomach.  Moderate to severe ileus unchanged. Electronically Signed   By: Franchot Gallo M.D.   On: 03/31/2018 16:01   Dg Abd Portable 1v  Result Date: 03/29/2018 CLINICAL DATA:  Generalized abdominal pain. EXAM: PORTABLE ABDOMEN - 1 VIEW COMPARISON:  Radiographs of Mar 28, 2018. FINDINGS: Nasogastric tube tip is in expected position of distal stomach and unchanged. Status post cholecystectomy. Slightly improved diffuse large and small bowel dilatation is noted suggesting improving ileus. IMPRESSION: Slightly decreased diffuse large and small bowel dilatation is noted suggesting improving ileus. Continued radiographic follow-up is recommended. Electronically Signed   By: Marijo Conception, M.D.   On: 03/29/2018 09:54   Dg Abd Portable 1v  Result Date: 03/25/2018 CLINICAL DATA:  Ileus EXAM: PORTABLE ABDOMEN - 1 VIEW COMPARISON:  Portable exam 0643 hours compared to 03/24/2018 FINDINGS: Gaseous distention of colon and small bowel loops. Cecum remains distended, up to 14.8 cm diameter, unchanged. Degree of bowel distention appears grossly similar to prior exam. No definite bowel wall thickening. Lung bases clear. Tip of nasogastric tube at distal esophagus, recommend advancing tube 12 cm. Lung bases clear. No gastric distention identified. IMPRESSION: Persistent gaseous distension of large and small bowel loops similar to previous exam question ileus. Recommend advancing nasogastric tube 12 cm into stomach. Electronically Signed   By: Lavonia Dana M.D.   On: 03/25/2018 09:20   Dg Abd Portable 1v  Result Date: 03/21/2018 CLINICAL  DATA:  Follow-up examination. Recent ileus with colonic distention. EXAM: PORTABLE ABDOMEN - 1 VIEW COMPARISON:  I have no priors for comparison. FINDINGS: Due to body habitus, the entire abdomen is not visible on the portable supine radiographs. There is colonic distention. Rectal gas is present suggesting ileus. Due to geometric magnification, measurements of cecal diameter are inaccurate. Surgical clips RIGHT upper quadrant status post cholecystectomy. IMPRESSION: Findings most consistent with colonic ileus. No priors are available to assess for interval improvement or worsening. Electronically Signed   By: Staci Righter M.D.   On: 03/21/2018 20:11   Dg Addison Bailey G Tube  Plc W/fl-no Rad  Result Date: 03/24/2018 CLINICAL DATA:  Ileus. EXAM: ABDOMEN - 1 VIEW; DG NASO G TUBE PLC W/FL-NO RAD CONTRAST:  None. FLUOROSCOPY TIME:  Fluoroscopy Time:  6 seconds. Radiation Exposure Index (if provided by the fluoroscopic device): 2.3 mGy. Number of Acquired Spot Images: 0 COMPARISON:  Abdominal x-ray from same day at 7:44 a.m. FINDINGS: Nasogastric tube within a large hiatal hernia. Diffuse gaseous distention of small bowel and colon is unchanged. IMPRESSION: Nasogastric tube placed within a large hiatal hernia. Electronically Signed   By: Titus Dubin M.D.   On: 03/24/2018 15:40   Ir Picc Placement Right >5 Yrs Inc Img Guide  Result Date: 03/28/2018 INDICATION: Right femoral DVT, thoracic myelopathy, poor peripheral access EXAM: ULTRASOUND AND FLUOROSCOPIC GUIDED PICC LINE INSERTION MEDICATIONS: 1% lidocaine local CONTRAST:  None FLUOROSCOPY TIME:  Twelve seconds (1 mGy) COMPLICATIONS: None immediate. TECHNIQUE: The procedure, risks, benefits, and alternatives were explained to the patient and informed written consent was obtained. A timeout was performed prior to the initiation of the procedure. The left upper extremity was prepped with chlorhexidine in a sterile fashion, and a sterile drape was applied covering the  operative field. Maximum barrier sterile technique with sterile gowns and gloves were used for the procedure. A timeout was performed prior to the initiation of the procedure. Local anesthesia was provided with 1% lidocaine. Under direct ultrasound guidance, the patent left brachial vein was accessed with a micropuncture kit after the overlying soft tissues were anesthetized with 1% lidocaine. An ultrasound image was saved for documentation purposes. A guidewire was advanced to the level of the superior caval-atrial junction for measurement purposes and the PICC line was cut to length. A peel-away sheath was placed and a 46 cm, 5 [place], dual lumen was inserted to level of the superior caval-atrial junction. A post procedure spot fluoroscopic was obtained. The catheter easily aspirated and flushed and was sutured in place. A dressing was placed. The patient tolerated the procedure well without immediate post procedural complication. FINDINGS: After catheter placement, the tip lies within the superior cavoatrial junction. The catheter aspirates and flushes normally and is ready for immediate use. IMPRESSION: Successful ultrasound and fluoroscopic guided placement of a left brachial vein approach, 46 cm, 5 French, dual lumen PICC with tip at the superior caval-atrial junction. The PICC line is ready for immediate use. Electronically Signed   By: Jerilynn Mages.  Shick M.D.   On: 03/28/2018 15:36   [place] Ekg Site Rite  Result Date: 03/25/2018 If Site Rite image not attached, placement could not be confirmed due to current cardiac rhythm.   Labs:  CBC: Recent Labs    04/03/18 1625 04/04/18 0509 04/04/18 0848 04/04/18 1520  WBC 7.1 4.3 3.8* 4.7  HGB 12.4 9.5* 9.1* 10.3*  HCT 37.3 28.2* 27.1* 30.7*  PLT 125* 84* 72* 91*    COAGS: No results for input(s): INR, APTT in the last 8760 hours.  BMP: Recent Labs    04/01/18 0311 04/02/18 0342 04/03/18 1207 04/04/18 0509  NA 134* 140 137 136  K 3.3* 3.2* 3.0*  3.0*  CL 99* 106 104 103  CO2 _0 GLUCOSE 158* 133* 98 105*  BUN _1 CALCIUM 9.3 9.2 8.7* 8.9  CREATININE 0.57 0.50 0.43* 0.45  GFRNONAA >60 >60 >60 >60  GFRAA >60 >60 >60 >60    LIVER FUNCTION TESTS: Recent Labs    03/22/18 0549  BILITOT 0.9  AST 29  ALT 52  ALKPHOS 45  PROT 4.6*  ALBUMIN 2.6*    TUMOR MARKERS: No results for input(s): AFPTM, CEA, CA199, CHROMGRNA in the last 8760 hours.  Assessment and Plan: DVT, thrombocytopenia Patient with decreasing Hgb and platelet count on IV heparin.  Plan is to stop heparin today and proceed with IVC filter placement.  Case reviewed and approved by Dr. Vernard Gambles.  Patient would like to proceed with sedation.  Latest potassium level is 7.5-- primary team to repeat lab value.  Will plan to proceed with filter placement tomorrow.  Patient is NPO due to ileus. Risks and benefits discussed with the patient including, but not limited to bleeding, infection, contrast induced renal failure, filter fracture or migration which can lead to emergency surgery or even death, strut penetration with damage or irritation to adjacent structures and caval thrombosis.  All of the patient's questions were answered, patient is agreeable to proceed. Consent signed and in chart.  Thank you for this interesting consult.  I greatly enjoyed meeting MAGALINE STEINBERG and look forward to participating in their care.  A copy of this report was sent to the requesting provider on this date.  Electronically Signed: Docia Barrier, PA 04/04/2018, 4:59 PM   I spent a total of 40 Minutes    in face to face in clinical consultation, greater than 50% of which was counseling/coordinating care for DVT

## 2018-04-04 NOTE — Progress Notes (Signed)
Nutrition Follow-up  DOCUMENTATION CODES:   Obesity unspecified  INTERVENTION:  When able to consume clear liquids, provide Boost Breeze po TID, each supplement provides 250 kcal and 9 grams of protein.  RD to continue to monitor.   NUTRITION DIAGNOSIS:   Inadequate oral intake related to altered GI function as evidenced by (post op ileus, liquid diet); ongoing  GOAL:   Patient will meet greater than or equal to 90% of their needs; not met  MONITOR:   PO intake, Diet advancement, Weight trends, Supplement acceptance, Labs, Skin, I & O's  REASON FOR ASSESSMENT:   Low Braden    ASSESSMENT:   62 year old female with history of hypertension, gait disorder with falls weakness and decreased sensation left lower extremity greater than right lower extremity since October 2018. Work-up revealed T7-T8 mass with marked cord compression and large enhancing tail emanating from dural based mass extending around the cord.  She did sustain a fall prior to her evaluation and required ORIF left wrist on 03/04/2018 by Dr. Caralyn Guile.   She was evaluated by [facility] and underwent T7-T8 tumor resection   Pt MD, ileus with slow improvement, however is tolerating periodic clamping of NGT. Pt able to consume small amounts of clear liquids when NGT is clamped. Recommend Boost Breeze to aid in caloric and protein needs. Labs and medications reviewed.   Diet Order:   Diet Order           Diet NPO time specified  Diet effective now          EDUCATION NEEDS:   Not appropriate for education at this time  Skin:  Skin Assessment: Skin Integrity Issues: Skin Integrity Issues:: Incisions Incisions: back  Last BM:  5/20  Height:   Ht Readings from Last 1 Encounters:  03/21/18 5' 4" (1.626 m)    Weight:   Wt Readings from Last 1 Encounters:  03/21/18 224 lb 3.3 oz (101.7 kg)    Ideal Body Weight:  54.5 kg  BMI:  Body mass index is 38.49 kg/m.  Estimated Nutritional  Needs:   Kcal:  1850-2000  Protein:  85-100 grams  Fluid:  1.8 - 2 L/day    Corrin Parker, MS, RD, LDN Pager # [phone] After hours/ weekend pager # [phone]

## 2018-04-04 NOTE — Progress Notes (Signed)
Follow up hgb and platelets improved. Heparin stopped earlier today. Pt on bedrest and comfortable. Await follow up BMET results.  Meredith Staggers, MD, [facility] 04/04/2018

## 2018-04-04 NOTE — Progress Notes (Signed)
Physical Therapy Session Note  Patient Details  Name: Hannah Jefferson MRN: [id number] Date of Birth: 06-30-1956  Today's Date: 04/04/2018 PT Individual Time: 0915-1000 PT Individual Time Calculation (min): 45 min   Short Term Goals: Week 2:  PT Short Term Goal 1 (Week 2): Pt will demonstrate 1 method of pressure relief in w/c with min cues. PT Short Term Goal 2 (Week 2): Pt will propel w/c 25' with supervision. PT Short Term Goal 3 (Week 2): Pt will transition to standing with +1 assist in 25% of opportunities with LRAD.   PT Short Term Goal 4 (Week 2): Pt will tolerate supported standing x1 minute for decreased burden of care with ADLs PT Short Term Goal 5 (Week 2): Pt will self direct use of bed features for decreased burden of care with bed mobility.    Skilled Therapeutic Interventions/Progress Updates:    Pt missed 15 minutes of skilled therapy tx secondary to RN care. Pt supine in bed upon PT arrival, reports feeling much better this AM compared to the past week. Pt transferred from supine>sitting EOB with mod assist, emphasis on log roll technique for back precautions. Pt performed sit<>stand within stedy max assist +2 from the bed and transferred to TIS w/c. Pt transported to the gym. Pt performed x 2 sit<>stands this session within parallel bars with max assist+2, facilitation for hip extension once in standing, pt able to stand 30-40 sec bouts, limited by L ankle pain. Pt left seated in w/c in dayroom with caregiver, RN aware.   Therapy Documentation Precautions:  Precautions Precautions: Fall, Back Precaution Comments: LUE NWB in splint Restrictions Weight Bearing Restrictions: Yes LUE Weight Bearing: Non weight bearing   See Function Navigator for Current Functional Status.   Therapy/Group: Individual Therapy  Netta Corrigan, PT, DPT 04/04/2018, 7:54 AM

## 2018-04-04 NOTE — Progress Notes (Signed)
Physical Therapy Note  Patient Details  Name: Hannah Jefferson MRN: [id number] Date of Birth: 1955/12/27 Today's Date: 04/04/2018  Notified by pt's sister and MD that pt on bedrest for placement of IVC filter.  Will f/u tomorrow if medically ready.    Shann Medal, PT, DPT  04/04/2018, 3:53 PM

## 2018-04-04 NOTE — Significant Event (Signed)
Critical value for K+= >7.5. Labs called to Alcide Goodness, PAC. Order for repeat labs and call for abnormal. Reviewed WBC and HGB level with PAC. Margarito Liner

## 2018-04-05 ENCOUNTER — Inpatient Hospital Stay (HOSPITAL_COMMUNITY): Payer: 59

## 2018-04-05 ENCOUNTER — Encounter (HOSPITAL_COMMUNITY): Payer: Self-pay | Admitting: Interventional Radiology

## 2018-04-05 ENCOUNTER — Inpatient Hospital Stay (HOSPITAL_COMMUNITY): Payer: 59 | Admitting: Physical Therapy

## 2018-04-05 HISTORY — PX: IR IVC FILTER PLMT / S&I /IMG GUID/MOD SED: IMG701

## 2018-04-05 LAB — MAGNESIUM: Magnesium: 1.8 mg/dL (ref 1.7–2.4)

## 2018-04-05 LAB — BASIC METABOLIC PANEL
Anion gap: 9 (ref 5–15)
Anion gap: 10 (ref 5–15)
BUN: <5 mg/dL — ABNORMAL LOW (ref 6–20)
BUN: <5 mg/dL — ABNORMAL LOW (ref 6–20)
CO2: 25 mmol/L (ref 22–32)
CO2: 24 mmol/L (ref 22–32)
Calcium: 8.6 mg/dL — ABNORMAL LOW (ref 8.9–10.3)
Calcium: 8.6 mg/dL — ABNORMAL LOW (ref 8.9–10.3)
Chloride: 101 mmol/L (ref 101–111)
Chloride: 101 mmol/L (ref 101–111)
Creatinine, Ser: 0.47 mg/dL (ref 0.44–1.00)
Creatinine, Ser: 0.48 mg/dL (ref 0.44–1.00)
GFR calc Af Amer: >60 mL/min (ref >60)
GFR calc Af Amer: >60 mL/min (ref >60)
GFR calc non Af Amer: >60 mL/min (ref >60)
GFR calc non Af Amer: >60 mL/min (ref >60)
Glucose, Bld: 106 mg/dL — ABNORMAL HIGH (ref 65–99)
Glucose, Bld: 117 mg/dL — ABNORMAL HIGH (ref 65–99)
Potassium: 3 mmol/L — ABNORMAL LOW (ref 3.5–5.1)
Potassium: 3.9 mmol/L (ref 3.5–5.1)
Sodium: 135 mmol/L (ref 135–145)
Sodium: 135 mmol/L (ref 135–145)

## 2018-04-05 LAB — CULTURE, BLOOD (ROUTINE X 2)
Culture: NO GROWTH
Culture: NO GROWTH
Special Requests: ADEQUATE
Special Requests: ADEQUATE

## 2018-04-05 LAB — CBC
HCT: 34 % — ABNORMAL LOW (ref 36.0–46.0)
Hemoglobin: 11.7 g/dL — ABNORMAL LOW (ref 12.0–15.0)
MCH: 29.8 pg (ref 26.0–34.0)
MCHC: 34.4 g/dL (ref 30.0–36.0)
MCV: 86.7 fL (ref 78.0–100.0)
Platelets: 93 10*3/uL — ABNORMAL LOW (ref 150–400)
RBC: 3.92 MIL/uL (ref 3.87–5.11)
RDW: 14.2 % (ref 11.5–15.5)
WBC: 4.9 10*3/uL (ref 4.0–10.5)

## 2018-04-05 MED ORDER — IOPAMIDOL (ISOVUE-300) INJECTION 61%
INTRAVENOUS | Status: AC
  Administered 2018-04-05: 15 mL
  Filled 2018-04-05: qty 100

## 2018-04-05 MED ORDER — POTASSIUM CHLORIDE 10 MEQ/100ML IV SOLN
10.0000 meq | INTRAVENOUS | Status: AC
Start: 2018-04-05 — End: 2018-04-05
  Administered 2018-04-05 (×5): 10 meq via INTRAVENOUS
  Filled 2018-04-05 (×5): qty 100

## 2018-04-05 MED ORDER — LIDOCAINE HCL (PF) 1 % IJ SOLN
INTRAMUSCULAR | Status: AC | PRN
  Administered 2018-04-05: 5 mL

## 2018-04-05 MED ORDER — MIDAZOLAM HCL 2 MG/2ML IJ SOLN
INTRAMUSCULAR | Status: AC
  Filled 2018-04-05: qty 2

## 2018-04-05 MED ORDER — FENTANYL CITRATE (PF) 100 MCG/2ML IJ SOLN
INTRAMUSCULAR | Status: AC | PRN
  Administered 2018-04-05: 50 ug via INTRAVENOUS

## 2018-04-05 MED ORDER — POTASSIUM CHLORIDE 20 MEQ/15ML (10%) PO SOLN: 40.0000 meq | Freq: Two times a day (BID) | ORAL | Status: DC

## 2018-04-05 MED ORDER — MAGNESIUM SULFATE 2 GM/50ML IV SOLN
2.0000 g | Freq: Once | INTRAVENOUS | Status: AC
  Administered 2018-04-05: 2 g via INTRAVENOUS
  Filled 2018-04-05: qty 50

## 2018-04-05 MED ORDER — POTASSIUM CHLORIDE CRYS ER 20 MEQ PO TBCR
40.0000 meq | EXTENDED_RELEASE_TABLET | Freq: Two times a day (BID) | ORAL | Status: DC
Start: 2018-04-05 — End: 2018-04-06
  Filled 2018-04-05: qty 2

## 2018-04-05 MED ORDER — FENTANYL CITRATE (PF) 100 MCG/2ML IJ SOLN
INTRAMUSCULAR | Status: AC
  Filled 2018-04-05: qty 2

## 2018-04-05 MED ORDER — SODIUM CHLORIDE 0.9% FLUSH
10.0000 mL | INTRAVENOUS | Status: DC | PRN
  Administered 2018-04-07 – 2018-04-16 (×4): 10 mL
  Filled 2018-04-05 (×4): qty 40

## 2018-04-05 MED ORDER — LIDOCAINE HCL 1 % IJ SOLN
INTRAMUSCULAR | Status: AC
  Filled 2018-04-05: qty 20

## 2018-04-05 MED ORDER — MIDAZOLAM HCL 2 MG/2ML IJ SOLN
INTRAMUSCULAR | Status: AC | PRN
  Administered 2018-04-05: 1 mg via INTRAVENOUS

## 2018-04-05 MED ORDER — IOPAMIDOL (ISOVUE-300) INJECTION 61%
INTRAVENOUS | Status: AC
  Administered 2018-04-05: 20 mL
  Filled 2018-04-05: qty 50

## 2018-04-05 NOTE — Progress Notes (Signed)
Occupational Therapy Note  Patient Details  Name: Hannah Jefferson MRN: [id number] Date of Birth: February 17, 1956  Today's Date: 04/05/2018 OT Missed Time: 73 Minutes Missed Time Reason: Patient on bedrest  Pt missed 45 mins skilled OT services while on bedrest following IVF.   Leotis Shames [facility] 04/05/2018, 3:00 PM

## 2018-04-05 NOTE — Progress Notes (Signed)
Physical Therapy Note  Patient Details  Name: Hannah Jefferson MRN: [id number] Date of Birth: 1956/11/05 Today's Date: 04/05/2018    Pt continues to be on bedrest for scheduled AM session.  Will continue to follow per POC.    Michel Santee 04/05/2018, 4:16 PM

## 2018-04-05 NOTE — Progress Notes (Signed)
Patient tolerated clear liquid oral intake, small quantities at a time. NG tube clamped per MD note and PA discussion for today, no complaints of discomfort. BM type 6-7 x 3 this shift. Potassium Run complete and lab draw scheduled. Attempted oral potassium administration per PA but patient unable to tolerate swallowing pill without applesauce.

## 2018-04-05 NOTE — Progress Notes (Signed)
Occupational Therapy Note  Patient Details  Name: Hannah Jefferson MRN: [id number] Date of Birth: 1956/06/26  Today's Date: 04/05/2018 OT Missed Time: 44 Minutes Missed Time Reason: Patient on bedrest  Pt missed 60 mins skilled OT services secondary to bedrest per MD order.   Leotis Shames [facility] 04/05/2018, 7:43 AM

## 2018-04-05 NOTE — Progress Notes (Signed)
[facility] PH[facility]IC[facility]ABILITATION     PROGRESS NOTE    Subjective/Complaints: No problems overnight. Continues to empty bowels. No abdominal pain. About to head out of IVCF when I came in this morning  ROS: Patient denies fever, rash, sore throat, blurred vision, nausea, vomiting, diarrhea, cough, shortness of breath or chest pain, joint or back pain, headache, or mood change.    Objective: Vital Signs: Blood pressure (!) 177/92, pulse 92, temperature 97.6 F (36.4 C), temperature source Oral, resp. rate 16, height 5\' 4"  (1.626 m), weight 102 kg (224 lb 13.9 oz), SpO2 97 %. Dg Abd 1 View  Result Date: 04/04/2018 CLINICAL DATA:  Follow-up ileus EXAM: ABDOMEN - 1 VIEW COMPARISON:  Abdominal radiographs from 1 day prior FINDINGS: Enteric tube terminates in the very proximal stomach, with slight interval retraction of the tube. Stomach appears decompressed. Mildly dilated small bowel loops in the central abdomen, mildly decreased in caliber. Prominent gaseous distention of the right and transverse colon, slightly improved in the transverse colon and slightly worsened in the cecum. Surgical clip overlies the deep pelvis. No evidence of pneumatosis or pneumoperitoneum. Clear lung bases. No radiopaque nephrolithiasis. IMPRESSION: 1. Enteric tube terminates in the very proximal stomach, with slight interval retraction of the tube. Stomach appears decompressed. 2. Persistent radiographic evidence of adynamic ileus, improved in the small bowel and transverse colon and slightly worsened in the cecum. Electronically Signed   By: Ilona Sorrel M.D.   On: 04/04/2018 09:30   Dg Abd 1 View  Result Date: 04/03/2018 CLINICAL DATA:  Ileus.  NG tube in place. EXAM: ABDOMEN - 1 VIEW COMPARISON:  CT abdomen and pelvis 04/01/2018. FINDINGS: Diffuse gaseous distention of large and small bowel remains. NG tube is in the stomach. The stomach is collapsed. There is no free air. Distal colonic air is  not clearly visualized. Levoconvex curvature is present in the lumbar spine. Heart is mildly enlarged. Lung bases are clear. IMPRESSION: 1. NG tube in place decompression of the stomach. 2. Persistent diffuse distention of large and small bowel compatible with ileus or distal obstruction. Electronically Signed   By: San Morelle M.D.   On: 04/03/2018 14:12   Recent Labs    04/04/18 1520 04/05/18 0506  WBC 4.7 4.9  HGB 10.3* 11.7*  HCT 30.7* 34.0*  PLT 91* 93*   Recent Labs    04/04/18 1736 04/05/18 0506  NA 132* 135  K 3.5 3.0*  CL 101 101  GLUCOSE 92 106*  BUN 5* <5*  CREATININE 0.47 0.47  CALCIUM 8.6* 8.6*   CBG (last 3)  No results for input(s): GLUCAP in the last 72 hours.  Wt Readings from Last 3 Encounters:  04/05/18 102 kg (224 lb 13.9 oz)  03/20/18 93.4 kg (206 lb)  02/15/18 94.1 kg (207 lb 8 oz)    Physical Exam:  Constitutional: No distress . Vital signs reviewed. HEENT: EOMI, oral membranes moist Neck: supple Cardiovascular: RRR without murmur. No JVD    Respiratory: CTA Bilaterally without wheezes or rales. Normal effort    GI: BS +/-, non-tender, decr distention Musculoskeletal: She exhibits noedema. Left arm in [facility].     Neurological: Motor RUE 5/5. LUE limited by splint can lift arm and wiggle all fingers. Decreased sensation to LT and proprioception, left greater than right brelow T8 dermatome. LE motor 2+ to 3/5 HF, KE and 3/5---stable neuro exam.  ADF/PF. DTR's 1+. No resting tone.stable Skin: Back incision CDI.  Midline RUE, PICC LUE Psychiatric:  pleasant  Assessment/Plan: 1. Paraplegia and functional deficits secondary to T8 myelopathy, left wrist fracture which require 3+ hours per day of interdisciplinary therapy in a comprehensive inpatient rehab setting. Physiatrist is providing close team supervision and 24 hour management of active medical problems listed below. Physiatrist and rehab team continue to assess barriers to  discharge/monitor patient progress toward functional and medical goals.  Function:  Bathing Bathing position Bathing activity did not occur: N/A Position: Wheelchair/chair at sink  Bathing parts Body parts bathed by patient: Right arm, Left arm, Chest, Abdomen, Right upper leg, Left upper leg Body parts bathed by helper: Back, Left lower leg, Right lower leg  Bathing assist Assist Level: Assistive device, 2 helpers Assistive Device Comment: reacher    Upper Body Dressing/Undressing Upper body dressing Upper body dressing/undressing activity did not occur: N/A What is the patient wearing?: Hospital gown(due to IV ) Bra - Perfomed by patient: Thread/unthread left bra strap, Thread/unthread right bra strap Bra - Perfomed by helper: Hook/unhook bra (pull down sports bra) Pull over shirt/dress - Perfomed by patient: Thread/unthread right sleeve, Thread/unthread left sleeve, Put head through opening Pull over shirt/dress - Perfomed by helper: Put head through opening, Pull shirt over trunk Button up shirt - Perfomed by patient: Thread/unthread right sleeve, Thread/unthread left sleeve, Button/unbutton shirt Button up shirt - Perfomed by helper: Pull shirt around back    Upper body assist Assist Level: Touching or steadying assistance(Pt > 75%)      Lower Body Dressing/Undressing Lower body dressing Lower body dressing/undressing activity did not occur: N/A What is the patient wearing?: Hospital Gown, Shoes       Pants- Performed by helper: Thread/unthread right pants leg, Thread/unthread left pants leg, Pull pants up/down   Non-skid slipper socks- Performed by helper: Don/doff right sock, Don/doff left sock       Shoes - Performed by helper: Don/doff right shoe, Don/doff left shoe, Fasten right, Fasten left          Lower body assist Assist for lower body dressing: 2 Automotive engineer activity did not occur: Safety/medical concerns   Toileting steps  completed by helper: Adjust clothing prior to toileting, Performs perineal hygiene, Adjust clothing after toileting Toileting Assistive Devices: Other (comment)(stedy)  Toileting assist Assist level: Two helpers   Transfers Chair/bed transfer   Chair/bed transfer method: Other Chair/bed transfer assist level: 2 helpers Chair/bed transfer assistive device: Mechanical lift Mechanical lift: Ecologist Ambulation activity did not occur: Safety/medical concerns         Wheelchair   Type: Manual Max wheelchair distance: 150 Assist Level: Touching or steadying assistance (Pt > 75%)  Cognition Comprehension Comprehension assist level: Follows complex conversation/direction with no assist  Expression Expression assist level: Expresses complex ideas: With no assist  Social Interaction Social Interaction assist level: Interacts appropriately with others - No medications needed.  Problem Solving Problem solving assist level: Solves basic 75 - 89% of the time/requires cueing 10 - 24% of the time  Memory Memory assist level: Recognizes or recalls 50 - 74% of the time/requires cueing 25 - 49% of the time   Medical Problem List and Plan: 1.Paraplegia and funtional deficitssecondary to T8 meningioma/resection with myeopathy -recent left wrist fracture -resume  PT, OT after IVCF 2. 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   -  labwork reviewed this morning and stable to improved.    -follow serially 3. Pain Management:Hydrocodone prn 4. Mood:LCSW to follow for evaluation and support. 5. Neuropsych: This patientiscapable of making decisions on herown behalf. 6. Skin/Wound Care:Routine pressure relief measures. 7. Fluids/Electrolytes/Nutrition:Monitor I/O.    -potassium again 3.0 today, repeat run of 5x KCl today  -mg  1.8--repeat IVPB mg  --BID labs  --trial of clears 8. Post op ileus/neurogenic bowel: looks the best since she's been in hospital  -continue clear trials  -KUB pending for today  -continue NGT to suction at night, clamping during day  -continue IV reglan  -encourage OOB to tolerance  -GI has signed off 9.Neurogenic bladder:foley out today, voiding trial/caths  -E coli UTI--continue rocephin q24 completed 10. GERD:managed withPPIbid. 11. Hyponatremia:    -hctz stopped  -continue D5NS IV  12.Leftwrist fracture: NWB. [place]   LOS (Days) [place] EVALUATION WAS PERFORMED  Meredith Staggers, MD 04/05/2018 9:20 AM

## 2018-04-05 NOTE — Procedures (Signed)
IVC filter placement L1/2 [place] EBL 0 Comp 0

## 2018-04-05 NOTE — Progress Notes (Signed)
Physical Therapy Note  Patient Details  Name: PASQUALINA COLASURDO MRN: [id number] Date of Birth: 01-03-1956 Today's Date: 04/05/2018    Pt continues to be on bedrest per MD orders. Will continue when medically cleared.    Nikea Settle  Corby Vandenberghe, PTA  04/05/2018, 12:17 PM

## 2018-04-06 ENCOUNTER — Inpatient Hospital Stay (HOSPITAL_COMMUNITY): Payer: 59

## 2018-04-06 ENCOUNTER — Inpatient Hospital Stay (HOSPITAL_COMMUNITY): Payer: 59 | Admitting: Physical Therapy

## 2018-04-06 ENCOUNTER — Inpatient Hospital Stay (HOSPITAL_COMMUNITY): Payer: 59 | Admitting: *Deleted

## 2018-04-06 LAB — CBC
HCT: 38.9 % (ref 36.0–46.0)
Hemoglobin: 13.2 g/dL (ref 12.0–15.0)
MCH: 29.5 pg (ref 26.0–34.0)
MCHC: 33.9 g/dL (ref 30.0–36.0)
MCV: 86.8 fL (ref 78.0–100.0)
Platelets: 114 10*3/uL — ABNORMAL LOW (ref 150–400)
RBC: 4.48 MIL/uL (ref 3.87–5.11)
RDW: 14.5 % (ref 11.5–15.5)
WBC: 6.6 10*3/uL (ref 4.0–10.5)

## 2018-04-06 LAB — BASIC METABOLIC PANEL
Anion gap: 7 (ref 5–15)
BUN: <5 mg/dL — ABNORMAL LOW (ref 6–20)
CO2: 27 mmol/L (ref 22–32)
Calcium: 8.8 mg/dL — ABNORMAL LOW (ref 8.9–10.3)
Chloride: 102 mmol/L (ref 101–111)
Creatinine, Ser: 0.48 mg/dL (ref 0.44–1.00)
GFR calc Af Amer: >60 mL/min (ref >60)
GFR calc non Af Amer: >60 mL/min (ref >60)
Glucose, Bld: 147 mg/dL — ABNORMAL HIGH (ref 65–99)
Potassium: 3.7 mmol/L (ref 3.5–5.1)
Sodium: 136 mmol/L (ref 135–145)

## 2018-04-06 MED ORDER — GENERIC EXTERNAL MEDICATION
Status: DC
Start: ? — End: 2018-04-06

## 2018-04-06 MED ORDER — MAGNESIUM OXIDE 400 (241.3 MG) MG PO TABS: 200.0000 mg | ORAL_TABLET | Freq: Two times a day (BID) | ORAL | Status: DC

## 2018-04-06 MED ORDER — POTASSIUM CHLORIDE ER 10 MEQ PO TBCR
20.0000 meq | EXTENDED_RELEASE_TABLET | Freq: Three times a day (TID) | ORAL | Status: DC
  Administered 2018-04-06 – 2018-04-10 (×15): 20 meq via ORAL
  Filled 2018-04-06 (×36): qty 2

## 2018-04-06 MED ORDER — POTASSIUM CHLORIDE ER 10 MEQ PO TBCR
20.0000 meq | EXTENDED_RELEASE_TABLET | Freq: Two times a day (BID) | ORAL | Status: DC
  Administered 2018-04-06: 20 meq via ORAL

## 2018-04-06 NOTE — Progress Notes (Signed)
Physical Therapy Session Note  Patient Details  Name: Hannah Jefferson MRN: [id number] Date of Birth: 09-Jun-1956  Today's Date: 04/06/2018 PT Individual Time: 0900-1000 PT Individual Time Calculation (min): 60 min   Short Term Goals: Week 2:  PT Short Term Goal 1 (Week 2): Pt will demonstrate 1 method of pressure relief in w/c with min cues. PT Short Term Goal 2 (Week 2): Pt will propel w/c 25' with supervision. PT Short Term Goal 3 (Week 2): Pt will transition to standing with +1 assist in 25% of opportunities with LRAD.   PT Short Term Goal 4 (Week 2): Pt will tolerate supported standing x1 minute for decreased burden of care with ADLs PT Short Term Goal 5 (Week 2): Pt will self direct use of bed features for decreased burden of care with bed mobility.    Skilled Therapeutic Interventions/Progress Updates:    Pt supine in bed, agreeable to PT. No complaints of pain. Supine to sit with mod A with v/c for log-rolling technique. Sit to stand max A x 2 to stedy. Stedy transfer bed to elevated commode seat over toilet. Sit to stand x 3 reps from elevated commode seat, standing tolerance x 2 min while dependent pericare and brief change performed. Transfer pt toilet to TIS w/c with stedy. Sit to stand x 3 reps in // bars with one UE support and max A, manual cues for hip ext and upright posture. Pt tolerates standing 45 sec at most. Assisted pt back to bed at end of session, max A with stedy. Sit to supine total A. Rolling L/R with min A for brief change and pericare. Pt left supine in bed with needs in reach and sister-in-law [name] present.   Therapy Documentation Precautions:  Precautions Precautions: Fall, Back Precaution Comments: LUE NWB in splint Restrictions Weight Bearing Restrictions: Yes LUE Weight Bearing: Non weight bearing  See Function Navigator for Current Functional Status.   Therapy/Group: Individual Therapy  Excell Seltzer, PT, DPT  04/06/2018, 11:57 AM

## 2018-04-06 NOTE — Progress Notes (Signed)
Physical Therapy Session Note  Patient Details  Name: Hannah Jefferson MRN: [id number] Date of Birth: Oct 12, 1956  Today's Date: 04/06/2018 PT Individual Time: 1435-1505 PT Individual Time Calculation (min): 30 min   Short Term Goals: Week 2:  PT Short Term Goal 1 (Week 2): Pt will demonstrate 1 method of pressure relief in w/c with min cues. PT Short Term Goal 2 (Week 2): Pt will propel w/c 25' with supervision. PT Short Term Goal 3 (Week 2): Pt will transition to standing with +1 assist in 25% of opportunities with LRAD.   PT Short Term Goal 4 (Week 2): Pt will tolerate supported standing x1 minute for decreased burden of care with ADLs PT Short Term Goal 5 (Week 2): Pt will self direct use of bed features for decreased burden of care with bed mobility.    Skilled Therapeutic Interventions/Progress Updates:    no c/o pain but reports fatigue.  Discussed attempting to stand with PRW, however unable to attempt due to time constraints.  Pt returned to room in w/c for time management. Transfer from w/c into stedy requires heavy +2 assist 2/2 fatigue.  Sit<>stand in elevated stedy with mod assist to rise with pt's LEs giving out once seat removed.  Seated EOB static balance with min guard for safety.  Return to supine with mod assist for LEs and pt positioned to comfort with call bell in reach and needs met.   Therapy Documentation Precautions:  Precautions Precautions: Fall, Back Precaution Comments: LUE NWB in splint Restrictions Weight Bearing Restrictions: Yes LUE Weight Bearing: Non weight bearing   See Function Navigator for Current Functional Status.   Therapy/Group: Individual Therapy  Michel Santee 04/06/2018, 4:34 PM

## 2018-04-06 NOTE — Progress Notes (Signed)
Social Work Patient ID: Hannah Jefferson, female   DOB: 11/10/1956, 62 y.o.   MRN: [id number]   Met with pt and sister-in-law yesterday to review team conference.  Pt aware and agreeable that team took off originally targeted d/c date of 6/4 and changed to ELOS of 2-3 weeks.  She is hopeful she can start participating fully in therapies ASAP.  Frustrated by ongoing medical issues.  Will continue to follow for support.  Sigmond Patalano, LCSW

## 2018-04-06 NOTE — Progress Notes (Signed)
Occupational Therapy Weekly Progress Note  Patient Details  Name: Hannah Jefferson MRN: [id number] Date of Birth: 03/15/1956  Beginning of progress report period: Mar 29, 2018 End of progress report period: Apr 06, 2018   Patient has met 0 of 3 short term goals.  Pt progress has been inhibited secondary to medical conditions and periodic orders for bed rest.  Pt has been able to stand in [place] with max/mod a for approx 20 seconds.  Pt is able to sit EOB with min A and engage in UB bathing tasks.  Pt tolerates sitting in TIS w/c for approx 1-2 hours.  Pt requires min/mod A for bed mobility and mod A for supine>sit EOB.   Patient continues to demonstrate the following deficits: muscle weakness, decreased cardiorespiratoy endurance and decreased standing balance, decreased postural control and decreased balance strategies and therefore will continue to benefit from skilled OT intervention to enhance overall performance with Reduce care partner burden.  Patient progressing toward long term goals..  Continue plan of care.  OT Short Term Goals Week 2:  OT Short Term Goal 1 (Week 2): Pt will be able to sit to stand with max A of 1 to prepare for LB self care. OT Short Term Goal 1 - Progress (Week 2): Progressing toward goal OT Short Term Goal 2 (Week 2): Pt will be able to squat pivot to w/c with max A of 1.  OT Short Term Goal 2 - Progress (Week 2): Progressing toward goal OT Short Term Goal 3 (Week 2): Pt will use AE to don pants over feet with mod A. OT Short Term Goal 3 - Progress (Week 2): Progressing toward goal Week 3:  OT Short Term Goal 1 (Week 3): Pt will be able to sit to stand with max A of 1 to prepare for LB self care. OT Short Term Goal 2 (Week 3): Pt will be able to squat pivot to w/c with max A of 1.  OT Short Term Goal 3 (Week 3): Pt will use AE to don pants over feet with mod A.     Therapy Documentation Precautions:  Precautions Precautions: Fall, Back Precaution  Comments: LUE NWB in splint Restrictions Weight Bearing Restrictions: Yes LUE Weight Bearing: Non weight bearing    See Function Navigator for Current Functional Status.     Leotis Shames [facility] 04/06/2018, 6:37 AM

## 2018-04-06 NOTE — Progress Notes (Signed)
Occupational Therapy Note  Patient Details  Name: Hannah Jefferson MRN: [id number] Date of Birth: Aug 21, 1956  Today's Date: 04/06/2018 OT Individual Time: 1345-1430 OT Individual Time Calculation (min): 45 min   Pt denies pain Individual Therapy  Pt resting in bed upon arrival with RN present.  OT intervention with focus on bed mobility, sit<>stand, and standing balance.  Pt required mod A for bed mobility (supine>sit EOB). Pt able to perform reciprocal scooting on EOB to prepare for sit<>stand with Stedy and transfer to TIS w/c.  Pt performed sit<>stand with Stedy with mod A.  Pt transitioned to gym and performed sit<>stand X 4 with therapist standing in front of pt.  Pt able to push up from w/c with LUE at mod A level.  Pt required max verbal cues for posture/positioning when standing for count of 15 X 4.  Pt remained in w/c awaiting PT with sister-in-law present.    Leotis Shames [facility] 04/06/2018, 2:46 PM

## 2018-04-06 NOTE — Progress Notes (Signed)
K+ 3.7 today. Will increase potassium to 20 meq QID as able to tolerate 10 meq pills. Will recheck in am.

## 2018-04-06 NOTE — Progress Notes (Signed)
Hannah Jefferson PHYSICAL MEDICINE & REHABILITATION     PROGRESS NOTE    Subjective/Complaints: Patient in good spirits today.  Slept well last night.  Up in her wheelchair when I arrived this morning.  Did not get up at all yesterday after her procedure.  To have stool  ROS: Patient denies fever, rash, sore throat, blurred vision, nausea, vomiting, diarrhea, cough, shortness of breath or chest pain, joint or back pain, headache, or mood change.   Objective: Vital Signs: Blood pressure 123/77, pulse 94, temperature 98.2 F (36.8 C), temperature source Oral, resp. rate 17, height 5\' 4"  (1.626 m), weight 102 kg (224 lb 13.9 oz), SpO2 97 %. Ir Ivc Filter Plmt / S&i /img Guid/mod Sed  Result Date: 04/05/2018 INDICATION: Pulmonary thromboembolism.  DVT.  Right heart strain. EXAM: IVC FILTER,INFERIOR VENA CAVOGRAM MEDICATIONS: None. ANESTHESIA/SEDATION: Fentanyl 50 mcg IV; Versed 1 mg IV Moderate Sedation Time:  12 minutes The patient was continuously monitored during the procedure by the interventional radiology nurse under my direct supervision. FLUOROSCOPY TIME:  Fluoroscopy Time: 2 minutes  seconds (30 mGy). COMPLICATIONS: None immediate. PROCEDURE: Informed written consent was obtained from the patient after a thorough discussion of the procedural risks, benefits and alternatives. All questions were addressed. Maximal Sterile Barrier Technique was utilized including caps, mask, sterile gowns, sterile gloves, sterile drape, hand hygiene and skin antiseptic. A timeout was performed prior to the initiation of the procedure. The right neck was prepped with Betadine in a sterile fashion, and a sterile drape was applied covering the operative field. A sterile gown and sterile gloves were used for the procedure. The right jugular vein was noted to be patent initially with ultrasound. Under sonographic guidance, a micropuncture needle was inserted into the right jugular vein (Ultrasound image  documentation was performed). It was removed over an 018 wire which was up-sized to a [place]. The sheath was inserted over the wire and into the IVC. IVC venography was performed. The temporary filter was then deployed in the infrarenal IVC. The sheath was removed and hemostasis was achieved with direct pressure. FINDINGS: IVC venography demonstrates renal vein inflow at L1. No DVT and no venous anomaly. No evidence of mega cava. Final image demonstrates an IVC filter in place with its tip at the L1 inferior endplate. IMPRESSION: Successful infrarenal IVC filter placement. This is a temporary filter. It can be removed or remain in place to become permanent. PLAN: This IVC filter is potentially retrievable. The patient will be assessed for filter retrieval by Interventional Radiology in approximately 8-12 weeks. Further recommendations regarding filter retrieval, continued surveillance or declaration of device permanence, will be made at that time. Electronically Signed   By: Marybelle Killings M.D.   On: 04/05/2018 11:56   Recent Labs    04/04/18 1520 04/05/18 0506  WBC 4.7 4.9  HGB 10.3* 11.7*  HCT 30.7* 34.0*  PLT 91* 93*   Recent Labs    04/05/18 0506 04/05/18 1830  NA 135 135  K 3.0* 3.9  CL 101 101  GLUCOSE 106* 117*  BUN <5* <5*  CREATININE 0.47 0.48  CALCIUM 8.6* 8.6*   CBG (last 3)  No results for input(s): GLUCAP in the last 72 hours.  Wt Readings from Last 3 Encounters:  04/05/18 102 kg (224 lb 13.9 oz)  03/20/18 93.4 kg (206 lb)  02/15/18 94.1 kg (207 lb 8 oz)    Physical Exam:  Constitutional: No distress . Vital signs reviewed. HEENT: EOMI,  oral membranes moist Neck: supple Cardiovascular: RRR without murmur. No JVD    Respiratory: CTA Bilaterally without wheezes or rales. Normal effort    GI: BS present but not overly active, non-tender, non-distended  Musculoskeletal: She exhibits noedema. Left arm in [facility].     Neurological: Motor RUE 5/5. LUE intact where  testable.  Decreased sensation to LT and proprioception, left greater than right brelow T8 dermatome. LE motor 2+ to 3/5 HF, KE and 3/5---stable neuro exam.  ADF/PF. DTR's 1+. No resting tone.stable Skin: Back incision CDI.  PICC LUE intact Psychiatric:  pleasant  Assessment/Plan: 1. Paraplegia and functional deficits secondary to T8 myelopathy, left wrist fracture which require 3+ hours per day of interdisciplinary therapy in a comprehensive inpatient rehab setting. Physiatrist is providing close team supervision and 24 hour management of active medical problems listed below. Physiatrist and rehab team continue to assess barriers to discharge/monitor patient progress toward functional and medical goals.  Function:  Bathing Bathing position Bathing activity did not occur: N/A Position: Sitting EOB  Bathing parts Body parts bathed by patient: Right arm, Left arm, Chest, Abdomen, Right upper leg, Left upper leg(UB only) Body parts bathed by helper: Front perineal area, Buttocks, Left lower leg, Right lower leg  Bathing assist Assist Level: Assistive device, 2 helpers Assistive Device Comment: reacher    Upper Body Dressing/Undressing Upper body dressing Upper body dressing/undressing activity did not occur: N/A What is the patient wearing?: Bra, Pull over shirt/dress Bra - Perfomed by patient: Thread/unthread left bra strap, Thread/unthread right bra strap Bra - Perfomed by helper: Thread/unthread right bra strap, Thread/unthread left bra strap, Hook/unhook bra (pull down sports bra) Pull over shirt/dress - Perfomed by patient: Thread/unthread right sleeve, Thread/unthread left sleeve, Pull shirt over trunk Pull over shirt/dress - Perfomed by helper: Put head through opening Button up shirt - Perfomed by patient: Thread/unthread right sleeve, Thread/unthread left sleeve, Button/unbutton shirt Button up shirt - Perfomed by helper: Pull shirt around back    Upper body assist Assist Level:  Touching or steadying assistance(Pt > 75%)      Lower Body Dressing/Undressing Lower body dressing Lower body dressing/undressing activity did not occur: N/A What is the patient wearing?: Pants, Socks, Shoes, AFO       Pants- Performed by helper: Thread/unthread right pants leg, Thread/unthread left pants leg, Pull pants up/down   Non-skid slipper socks- Performed by helper: Don/doff right sock, Don/doff left sock   Socks - Performed by helper: Don/doff right sock, Don/doff left sock   Shoes - Performed by helper: Don/doff right shoe, Don/doff left shoe, Fasten right, Fasten left   AFO - Performed by helper: Don/doff left AFO      Lower body assist Assist for lower body dressing: 2 Helpers      Toileting Toileting Toileting activity did not occur: Safety/medical concerns   Toileting steps completed by helper: Adjust clothing prior to toileting, Performs perineal hygiene, Adjust clothing after toileting Toileting Assistive Devices: Other (comment)(stedy)  Toileting assist Assist level: Two helpers   Transfers Chair/bed transfer   Chair/bed transfer method: Other Chair/bed transfer assist level: 2 helpers Chair/bed transfer assistive device: Mechanical lift Mechanical lift: Ecologist Ambulation activity did not occur: Safety/medical concerns         Wheelchair   Type: Manual Max wheelchair distance: 150 Assist Level: Touching or steadying assistance (Pt > 75%)  Cognition Comprehension Comprehension assist level: Follows complex conversation/direction with no assist  Expression Expression assist level: Expresses complex ideas: With  no assist  Social Interaction Social Interaction assist level: Interacts appropriately with others - No medications needed.  Problem Solving Problem solving assist level: Solves basic 75 - 89% of the time/requires cueing 10 - 24% of the time  Memory Memory assist level: Recognizes or recalls 50 - 74% of the time/requires  cueing 25 - 49% of the time   Medical Problem List and Plan: 1.Paraplegia and funtional deficitssecondary to T8 meningioma/resection with myeopathy -recent left wrist fracture -resume  PT, OT after IVCF 2. 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   -labwork pending today but has been improving    -follow serially 3. Pain Management:Hydrocodone prn 4. Mood:LCSW to follow for evaluation and support. 5. Neuropsych: This patientiscapable of making decisions on herown behalf. 6. Skin/Wound Care:Routine pressure relief measures. 7. Fluids/Electrolytes/Nutrition:Monitor I/O.    -potassium again 3.9 yesterday afternoon---follow up labs pending today  -mg 1.8--yesterday  --continue labs  --increase clear liquid intake 8. Post op ileus/neurogenic bowel:    -advance clear liquid intake  -KUB with improvement, cecal dilation has improved quite a bit   -continue NGT to suction at night, clamping during day  -continue IV reglan  -encourage OOB to tolerance  -GI has signed off 9.Neurogenic bladder:foley out today, voiding trial/caths  -E coli UTI--continue rocephin q24 completed 10. GERD:managed withPPIbid. 11. Hyponatremia:    -hctz stopped  -continue D5NS IV  12.Leftwrist fracture: NWB. [place]   LOS (Days) [place] EVALUATION WAS PERFORMED  Meredith Staggers, MD 04/06/2018 9:37 AM

## 2018-04-06 NOTE — Progress Notes (Signed)
Occupational Therapy Session Note  Patient Details  Name: Hannah Jefferson MRN: [id number] Date of Birth: 04/19/1956  Today's Date: 04/06/2018 OT Individual Time: 0700-0758 OT Individual Time Calculation (min): 58 min    Short Term Goals: Week 3:  OT Short Term Goal 1 (Week 3): Pt will be able to sit to stand with max A of 1 to prepare for LB self care. OT Short Term Goal 2 (Week 3): Pt will be able to squat pivot to w/c with max A of 1.  OT Short Term Goal 3 (Week 3): Pt will use AE to don pants over feet with mod A.  Skilled Therapeutic Interventions/Progress Updates:   OT intervention with focus on bed mobility, sitting balance, BADL retraining, and safety awareness to increase independence with BADLs. Pt rolls onto side without assistance but requires mod A for supine>sit EOB with mod A. Pt is able to maintain sitting balance EOB at supervision level.  Pt completes UB bathing/dressing tasks seated EOB with mod A secondary to PICC line and NG tube.  Pt required assistance threading pants and use Stedy to perform sit<>stand to pull up pants.  Pt transferred to w/c with Stedy.  Pt completed grooming tasks seated in w/c at sink.  Pt remained in w/c with all needs within reach and sister-in-law present.   Therapy Documentation Precautions:  Precautions Precautions: Fall, Back Precaution Comments: LUE NWB in splint Restrictions Weight Bearing Restrictions: Yes LUE Weight Bearing: Non weight bearing Pain:  Pt denies pain this morning    See Function Navigator for Current Functional Status.   Therapy/Group: Individual Therapy  [name] 04/06/2018, 8:01 AM

## 2018-04-07 ENCOUNTER — Inpatient Hospital Stay (HOSPITAL_COMMUNITY): Payer: 59

## 2018-04-07 ENCOUNTER — Inpatient Hospital Stay (HOSPITAL_COMMUNITY): Payer: 59 | Admitting: Physical Therapy

## 2018-04-07 DIAGNOSIS — G8220 Paraplegia, unspecified: Secondary | ICD-10-CM

## 2018-04-07 LAB — BASIC METABOLIC PANEL
Anion gap: 7 (ref 5–15)
BUN: <5 mg/dL — ABNORMAL LOW (ref 6–20)
CO2: 26 mmol/L (ref 22–32)
Calcium: 8.8 mg/dL — ABNORMAL LOW (ref 8.9–10.3)
Chloride: 102 mmol/L (ref 101–111)
Creatinine, Ser: 0.4 mg/dL — ABNORMAL LOW (ref 0.44–1.00)
GFR calc Af Amer: >60 mL/min (ref >60)
GFR calc non Af Amer: >60 mL/min (ref >60)
Glucose, Bld: 138 mg/dL — ABNORMAL HIGH (ref 65–99)
Potassium: 4.1 mmol/L (ref 3.5–5.1)
Sodium: 135 mmol/L (ref 135–145)

## 2018-04-07 NOTE — Progress Notes (Signed)
Occupational Therapy Session Note  Patient Details  Name: Hannah Jefferson MRN: [id number] Date of Birth: May 15, 1956  Today's Date: 04/07/2018 OT Individual Time: 0700-0825 OT Individual Time Calculation (min): 85 min    Short Term Goals: Week 3:  OT Short Term Goal 1 (Week 3): Pt will be able to sit to stand with max A of 1 to prepare for LB self care. OT Short Term Goal 2 (Week 3): Pt will be able to squat pivot to w/c with max A of 1.  OT Short Term Goal 3 (Week 3): Pt will use AE to don pants over feet with mod A.  Skilled Therapeutic Interventions/Progress Updates:    OT intervention with focus on BADL retraining, bed mobility, sit<>stand, standing balance, and ongoing education to increase independence with BADLs. Pt required mod A for supine>sit EOB and mod A for sit<>stand with Stedy to pull up pants and transfer to w/c.  Pt maintained sitting balance EOB with occasional min A. Pt threaded pants over BLE using reacher and required assistance pulling up pants while standing in [place].  Pt completed grooming tasks seated in w/c at sink.  Pt assisted with washing and drying hair while seated at sink.  Pt required more than a reasonable amount of time to complete tasks.  Pt's sister commented (out of hearing to pt) that pt is a little depressed this morning.  RN aware.  Pt remained in w/c with all needs within reach.   Therapy Documentation Precautions:  Precautions Precautions: Fall, Back Precaution Comments: LUE NWB in splint Restrictions Weight Bearing Restrictions: Yes LUE Weight Bearing: Non weight bearing   Pain: Pain Assessment Pain Score: 0-No pain  See Function Navigator for Current Functional Status.   Therapy/Group: Individual Therapy  [name] 04/07/2018, 8:27 AM

## 2018-04-07 NOTE — Progress Notes (Signed)
[place] PHYSICAL MEDICINE & REHABILITATION     PROGRESS NOTE    Subjective/Complaints: No abdominal pain today.  NG tube to suction overnight but clamped during the day.  No vomiting or nausea.  Taking clear diet.  Receiving IV fluids at night  ROS: Patient denies fever, rash, sore throat, blurred vision, nausea, vomiting, diarrhea, cough, shortness of breath or chest pain, joint or back pain, headache, or mood change.   Objective: Vital Signs: Blood pressure 128/75, pulse 96, temperature 98.2 F (36.8 C), temperature source Oral, resp. rate 16, height 5\' 4"  (1.626 m), weight 102 kg (224 lb 13.9 oz), SpO2 96 %. Dg Abd 1 View  Result Date: 04/06/2018 CLINICAL DATA:  Ileus EXAM: ABDOMEN - 1 VIEW COMPARISON:  04/04/2018 FINDINGS: Diffuse gaseous distention of bowel has improved since prior study. Mildly prominent large and small bowels remain. NG tube tip is in the proximal stomach. Prior cholecystectomy. No organomegaly or free air. IVC filter now noted. IMPRESSION: Improving gaseous distention of bowel/ileus. Electronically Signed   By: Rolm Baptise M.D.   On: 04/06/2018 09:49   Recent Labs    04/05/18 0506 04/06/18 1257  WBC 4.9 6.6  HGB 11.7* 13.2  HCT 34.0* 38.9  PLT 93* 114*   Recent Labs    04/06/18 1257 04/07/18 0446  NA 136 135  K 3.7 4.1  CL 102 102  GLUCOSE 147* 138*  BUN <5* <5*  CREATININE 0.48 0.40*  CALCIUM 8.8* 8.8*   CBG (last 3)  No results for input(s): GLUCAP in the last 72 hours.  Wt Readings from Last 3 Encounters:  04/05/18 102 kg (224 lb 13.9 oz)  03/20/18 93.4 kg (206 lb)  02/15/18 94.1 kg (207 lb 8 oz)    Physical Exam:  Constitutional: No distress . Vital signs reviewed. HEENT: EOMI, oral membranes moist Neck: supple Cardiovascular: RRR without murmur. No JVD    Respiratory: CTA Bilaterally without wheezes or rales. Normal effort    GI: BS present but not overly active, non-tender, non-distended  Musculoskeletal: She exhibits  noedema. Left arm in [facility].     Neurological: Motor RUE 5/5. LUE intact where testable.  Decreased sensation to LT and proprioception, left greater than right brelow T8 dermatome. LE motor 2+ to 3/5 HF, KE and 3/5---stable neuro exam.  ADF/PF. DTR's 1+. No resting tone.stable Skin: Back incision CDI.  PICC LUE intact Psychiatric:  pleasant  Assessment/Plan: 1. Paraplegia and functional deficits secondary to T8 myelopathy, left wrist fracture which require 3+ hours per day of interdisciplinary therapy in a comprehensive inpatient rehab setting. Physiatrist is providing close team supervision and 24 hour management of active medical problems listed below. Physiatrist and rehab team continue to assess barriers to discharge/monitor patient progress toward functional and medical goals.  Function:  Bathing Bathing position Bathing activity did not occur: N/A Position: Sitting EOB  Bathing parts Body parts bathed by patient: Right arm, Left arm, Chest, Abdomen, Right upper leg Body parts bathed by helper: Front perineal area, Buttocks, Left lower leg, Right lower leg  Bathing assist Assist Level: Assistive device, 2 helpers Assistive Device Comment: Geologist, engineering Dressing/Undressing Upper body dressing Upper body dressing/undressing activity did not occur: N/A What is the patient wearing?: Pull over shirt/dress Bra - Perfomed by patient: Thread/unthread left bra strap, Thread/unthread right bra strap Bra - Perfomed by helper: Thread/unthread right bra strap, Thread/unthread left bra strap, Hook/unhook bra (pull down sports bra) Pull over shirt/dress -  Perfomed by patient: Thread/unthread right sleeve, Thread/unthread left sleeve, Pull shirt over trunk Pull over shirt/dress - Perfomed by helper: Put head through opening Button up shirt - Perfomed by patient: Thread/unthread right sleeve, Thread/unthread left sleeve, Button/unbutton shirt Button up shirt - Perfomed by helper: Pull  shirt around back    Upper body assist Assist Level: Touching or steadying assistance(Pt > 75%)      Lower Body Dressing/Undressing Lower body dressing Lower body dressing/undressing activity did not occur: N/A What is the patient wearing?: Pants, Socks, Shoes     Pants- Performed by patient: Thread/unthread left pants leg Pants- Performed by helper: Thread/unthread right pants leg, Pull pants up/down   Non-skid slipper socks- Performed by helper: Don/doff right sock, Don/doff left sock   Socks - Performed by helper: Don/doff right sock, Don/doff left sock   Shoes - Performed by helper: Don/doff right shoe, Don/doff left shoe, Fasten right, Fasten left   AFO - Performed by helper: Don/doff left AFO      Lower body assist Assist for lower body dressing: 2 Helpers      Toileting Toileting Toileting activity did not occur: Safety/medical concerns   Toileting steps completed by helper: Adjust clothing prior to toileting, Performs perineal hygiene, Adjust clothing after toileting Toileting Assistive Devices: Other (comment)(stedy)  Toileting assist Assist level: Two helpers   Transfers Chair/bed transfer   Chair/bed transfer method: Other Chair/bed transfer assist level: 2 helpers Chair/bed transfer assistive device: Mechanical lift Mechanical lift: Ecologist Ambulation activity did not occur: Safety/medical concerns         Wheelchair   Type: Manual Max wheelchair distance: 150 Assist Level: Touching or steadying assistance (Pt > 75%)  Cognition Comprehension Comprehension assist level: Follows complex conversation/direction with no assist  Expression Expression assist level: Expresses complex ideas: With no assist  Social Interaction Social Interaction assist level: Interacts appropriately with others - No medications needed.  Problem Solving Problem solving assist level: Solves basic problems with no assist  Memory Memory assist level:  Recognizes or recalls 90% of the time/requires cueing < 10% of the time   Medical Problem List and Plan: 1.Paraplegia and funtional deficitssecondary to T8 meningioma/resection with myeopathy -recent left wrist fracture -Continue CIR PT OT 2. 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   -labwork pending today but has been improving    -follow serially 3. Pain Management:Hydrocodone prn 4. Mood:LCSW to follow for evaluation and support. 5. Neuropsych: This patientiscapable of making decisions on herown behalf. 6. Skin/Wound Care:Routine pressure relief measures. 7. Fluids/Electrolytes/Nutrition:Monitor I/O.    -potassium again 4.1 today afternoon---follow up labs pending today  -mg 1.8--yesterday  --continue labs  --increase clear liquid intake 8. Post op ileus/neurogenic bowel:    -advance clear liquid intake  -KUB with improvement, cecal dilation has improved quite a bit   -continue NGT to suction at night, clamping during day  -continue IV reglan  -encourage OOB to tolerance  -GI has signed off 9.Neurogenic bladder:foley out today, voiding trial/caths  -E coli UTI--resolved Rocephin  completed 10. GERD:managed withPPIbid. 11. Hyponatremia:    -hctz stopped  -continue D5NS IV  12.Leftwrist fracture: NWB. [place]   LOS (Days) 17 A FACE TO FACE EVALUATION WAS PERFORMED  Charlett Blake, MD 04/07/2018 1:47 PM

## 2018-04-07 NOTE — Progress Notes (Signed)
Physical Therapy Session Note  Patient Details  Name: Hannah Jefferson MRN: [id number] Date of Birth: 1956-04-11  Today's Date: 04/07/2018 PT Individual Time: 1400-1500 PT Individual Time Calculation (min): 60 min   Short Term Goals: Week 2:  PT Short Term Goal 1 (Week 2): Pt will demonstrate 1 method of pressure relief in w/c with min cues. PT Short Term Goal 2 (Week 2): Pt will propel w/c 25' with supervision. PT Short Term Goal 3 (Week 2): Pt will transition to standing with +1 assist in 25% of opportunities with LRAD.   PT Short Term Goal 4 (Week 2): Pt will tolerate supported standing x1 minute for decreased burden of care with ADLs PT Short Term Goal 5 (Week 2): Pt will self direct use of bed features for decreased burden of care with bed mobility.    Skilled Therapeutic Interventions/Progress Updates:    no c/o pain.  Session focus on activity tolerance via bed mobility, transfers, and standing.    Pt noted to be incontinent of bowel/bladder on arrival.  Rolling L/R with min assist and cues for LE placement to maintain side lying for total assist clothing management and hygiene.  Supine>sit with bed features and min assist for trunk.  Sit<>stand from elevated EOB with cues for forward/up movement and ultimately mod assist and stedy used to transfer to w/c.  In therapy gym, pt able to stand from w/c on first attempt with PRW and mod assist and maintain standing x30 seconds.  On second two attempts, reporting pain at anterior of L ankle joint, PT applied kinesiotape for pain reduction, and on 3rd/4th attempt pt reports improved pain but inability to power up 2/2 fatigue.  Returned to room in w/c at end of session, positioned in tilt in space w/c with call bell in reach and needs met.   Therapy Documentation Precautions:  Precautions Precautions: Fall, Back Precaution Comments: LUE NWB in splint Restrictions Weight Bearing Restrictions: Yes LUE Weight Bearing: Non weight  bearing   See Function Navigator for Current Functional Status.   Therapy/Group: Individual Therapy  Michel Santee 04/07/2018, 5:03 PM

## 2018-04-07 NOTE — Progress Notes (Signed)
Physical Therapy Session Note  Patient Details  Name: Hannah Jefferson MRN: [id number] Date of Birth: 1956-05-03  Today's Date: 04/07/2018 PT Individual Time: 0915-1000 PT Individual Time Calculation (min): 45 min   Short Term Goals: Week 2:  PT Short Term Goal 1 (Week 2): Pt will demonstrate 1 method of pressure relief in w/c with min cues. PT Short Term Goal 2 (Week 2): Pt will propel w/c 25' with supervision. PT Short Term Goal 3 (Week 2): Pt will transition to standing with +1 assist in 25% of opportunities with LRAD.   PT Short Term Goal 4 (Week 2): Pt will tolerate supported standing x1 minute for decreased burden of care with ADLs PT Short Term Goal 5 (Week 2): Pt will self direct use of bed features for decreased burden of care with bed mobility.    Skilled Therapeutic Interventions/Progress Updates:    Pt reclined in TIS w/c, agreeable to PT. No complaints of pain. Sit to stand x 3 reps in // bars: 20 sec, 30 sec, 35 sec with max A to stand and manual cues for hip extension while standing, RUE support on // bars. Seated hip ext 3 x 15 reps on kinetron with gait belt around thighs to control hip abd. Seated hip add 3 x 5 reps with 5 sec hold on towel roll. Pt left reclined in TIS w/c with sister-in-law to go outdoors.  Therapy Documentation Precautions:  Precautions Precautions: Fall, Back Precaution Comments: LUE NWB in splint Restrictions Weight Bearing Restrictions: Yes LUE Weight Bearing: Non weight bearing  See Function Navigator for Current Functional Status.   Therapy/Group: Individual Therapy  Excell Seltzer, PT, DPT  04/07/2018, 12:16 PM

## 2018-04-07 NOTE — Progress Notes (Signed)
   04/07/18 1600  Clinical Encounter Type  Visited With Patient and family together  Visit Type Follow-up  Referral From Nurse  Consult/Referral To Chaplain  Spiritual Encounters  Spiritual Needs Prayer;Emotional  Stress Factors  Patient Stress Factors Health changes  Chaplain visited with PT and friends.  After much small talk the Chaplain prayed with the PT and friends.  The PT was very receptive of prayer in lieu of PT's pastor being out of town.

## 2018-04-08 ENCOUNTER — Inpatient Hospital Stay (HOSPITAL_COMMUNITY): Payer: 59 | Admitting: Occupational Therapy

## 2018-04-08 LAB — MAGNESIUM: Magnesium: 1.4 mg/dL — ABNORMAL LOW (ref 1.7–2.4)

## 2018-04-08 LAB — BASIC METABOLIC PANEL
Anion gap: 7 (ref 5–15)
BUN: <5 mg/dL — ABNORMAL LOW (ref 6–20)
CO2: 26 mmol/L (ref 22–32)
Calcium: 9.1 mg/dL (ref 8.9–10.3)
Chloride: 101 mmol/L (ref 101–111)
Creatinine, Ser: 0.47 mg/dL (ref 0.44–1.00)
GFR calc Af Amer: >60 mL/min (ref >60)
GFR calc non Af Amer: >60 mL/min (ref >60)
Glucose, Bld: 140 mg/dL — ABNORMAL HIGH (ref 65–99)
Potassium: 4.3 mmol/L (ref 3.5–5.1)
Sodium: 134 mmol/L — ABNORMAL LOW (ref 135–145)

## 2018-04-08 MED ORDER — MAGNESIUM OXIDE 400 (241.3 MG) MG PO TABS
200.0000 mg | ORAL_TABLET | Freq: Every day | ORAL | Status: DC
  Administered 2018-04-08 – 2018-04-10 (×3): 200 mg via ORAL
  Filled 2018-04-08 (×2): qty 1

## 2018-04-08 NOTE — Progress Notes (Signed)
Occupational Therapy Session Note  Patient Details  Name: Hannah Jefferson MRN: [id number] Date of Birth: May 29, 1956  Today's Date: 04/08/2018 OT Individual Time: 0254-2706 OT Individual Time Calculation (min): 94 min    Short Term Goals: Week 3:  OT Short Term Goal 1 (Week 3): Pt will be able to sit to stand with max A of 1 to prepare for LB self care. OT Short Term Goal 2 (Week 3): Pt will be able to squat pivot to w/c with max A of 1.  OT Short Term Goal 3 (Week 3): Pt will use AE to don pants over feet with mod A.  Skilled Therapeutic Interventions/Progress Updates: Though doc came in for assessment and nursing to administer meds, son was present for therapy session once mom's brief change was completed.     After BM brief was changed by this clinician, patient stated she was already tired from the lateral rolls (Min A) but still concurred to work on upper body bathing and dressing (min A)..... Initially she was willing to sit edge of bed before transferring into reclining back wheelchair via Stedy from her bed.  Son came back in after this clinician helped patient don pants with lateral rolls in bed.   Though she stated,"I think you think I can do more than I can," with good dynamic sitting balance this session, she was able to don shoes with max assist an via long handled shoe horn.   After completing this activiity, she stated, "wow that was not so bad, and I did better than I thought I would."   Son stood by for watching demonstration for patient/family education.     Patient required moderate asisstance to go sit to stand from bed into stedy.   After sitting in stedy unsupported for 5 minutes, she stated she was fatigued.   After transferring form standing to sitting in stedy.  She maintained postural alignment for approximately another 5 minutes.   Once in her recliner back w/c she was was able to scoot hip back with total assist x1  She was left seated in her w/c with son sitting  supportively close by   He stated he would inform staff before leaving so that chair safety belt could be engaged.   Patient stated she did not want to wear it while someone was present to help watch forher w/c safe positioning.     Therapy Documentation Precautions:  Precautions Precautions: Fall, Back Precaution Comments: LUE NWB in splint Restrictions Weight Bearing Restrictions: Yes LUE Weight Bearing: Non weight bearing  Pain:denied   See Function Navigator for Current Functional Status.   Therapy/Group: Individual Therapy  Alfredia Ferguson [facility] 04/08/2018, 5:30 PM

## 2018-04-08 NOTE — Progress Notes (Signed)
[place] PHYSICAL MEDICINE & REHABILITATION     PROGRESS NOTE    Subjective/Complaints: No abdominal pain, patient would like to try some solid food does not find the liquid diet palatable.  Had a bowel movement and passing gas.  ROS: Denies chest pain shortness of breath nausea vomiting diarrhea  Objective: Vital Signs: Blood pressure 121/84, pulse 99, temperature 97.8 F (36.6 C), temperature source Oral, resp. rate 20, height 5\' 4"  (1.626 m), weight 102 kg (224 lb 13.9 oz), SpO2 97 %. No results found. Recent Labs    04/06/18 1257  WBC 6.6  HGB 13.2  HCT 38.9  PLT 114*   Recent Labs    04/07/18 0446 04/08/18 0311  NA 135 134*  K 4.1 4.3  CL 102 101  GLUCOSE 138* 140*  BUN <5* <5*  CREATININE 0.40* 0.47  CALCIUM 8.8* 9.1   CBG (last 3)  No results for input(s): GLUCAP in the last 72 hours.  Wt Readings from Last 3 Encounters:  04/05/18 102 kg (224 lb 13.9 oz)  03/20/18 93.4 kg (206 lb)  02/15/18 94.1 kg (207 lb 8 oz)    Physical Exam:  Constitutional: No distress . Vital signs reviewed. HEENT: EOMI, oral membranes moist Neck: supple Cardiovascular: RRR without murmur. No JVD    Respiratory: CTA Bilaterally without wheezes or rales. Normal effort    GI: BS present but not overly active, non-tender, non-distended, no evidence of high-pitched bowel sounds. Musculoskeletal: She exhibits noedema.  Neurological: Motor RUE 5/5. LUE intact where testable.  Decreased sensation to LT and proprioception, left greater than right brelow T8 dermatome. LE motor 2+ to 3/5 HF, KE and 3/5---stable neuro exam.  ADF/PF. DTR's 1+. No resting tone.stable Skin: Back incision CDI.  PICC LUE intact Psychiatric:  pleasant  Assessment/Plan: 1. Paraplegia and functional deficits secondary to T8 myelopathy, left wrist fracture which require 3+ hours per day of interdisciplinary therapy in a comprehensive inpatient rehab setting. Physiatrist is providing close team  supervision and 24 hour management of active medical problems listed below. Physiatrist and rehab team continue to assess barriers to discharge/monitor patient progress toward functional and medical goals.  Function:  Bathing Bathing position Bathing activity did not occur: N/A Position: Sitting EOB  Bathing parts Body parts bathed by patient: Right arm, Left arm, Chest, Abdomen, Right upper leg Body parts bathed by helper: Front perineal area, Buttocks, Left lower leg, Right lower leg  Bathing assist Assist Level: Assistive device, 2 helpers Assistive Device Comment: Geologist, engineering Dressing/Undressing Upper body dressing Upper body dressing/undressing activity did not occur: N/A What is the patient wearing?: Pull over shirt/dress Bra - Perfomed by patient: Thread/unthread left bra strap, Thread/unthread right bra strap Bra - Perfomed by helper: Thread/unthread right bra strap, Thread/unthread left bra strap, Hook/unhook bra (pull down sports bra) Pull over shirt/dress - Perfomed by patient: Thread/unthread right sleeve, Thread/unthread left sleeve, Pull shirt over trunk Pull over shirt/dress - Perfomed by helper: Put head through opening Button up shirt - Perfomed by patient: Thread/unthread right sleeve, Thread/unthread left sleeve, Button/unbutton shirt Button up shirt - Perfomed by helper: Pull shirt around back    Upper body assist Assist Level: Touching or steadying assistance(Pt > 75%)      Lower Body Dressing/Undressing Lower body dressing Lower body dressing/undressing activity did not occur: N/A What is the patient wearing?: Pants, Socks, Shoes     Pants- Performed by patient: Thread/unthread left pants leg Pants- Performed  by helper: Thread/unthread right pants leg, Pull pants up/down   Non-skid slipper socks- Performed by helper: Don/doff right sock, Don/doff left sock   Socks - Performed by helper: Don/doff right sock, Don/doff left sock   Shoes - Performed  by helper: Don/doff right shoe, Don/doff left shoe, Fasten right, Fasten left   AFO - Performed by helper: Don/doff left AFO      Lower body assist Assist for lower body dressing: 2 Helpers      Toileting Toileting Toileting activity did not occur: Safety/medical concerns   Toileting steps completed by helper: Adjust clothing prior to toileting, Performs perineal hygiene, Adjust clothing after toileting Toileting Assistive Devices: Other (comment)(stedy)  Toileting assist Assist level: Two helpers   Transfers Chair/bed transfer   Chair/bed transfer method: Other Chair/bed transfer assist level: 2 helpers Chair/bed transfer assistive device: Mechanical lift Mechanical lift: Ecologist Ambulation activity did not occur: Safety/medical concerns         Wheelchair   Type: Manual Max wheelchair distance: 150 Assist Level: Touching or steadying assistance (Pt > 75%)  Cognition Comprehension Comprehension assist level: Follows complex conversation/direction with no assist  Expression Expression assist level: Expresses complex 90% of the time/cues < 10% of the time  Social Interaction Social Interaction assist level: Interacts appropriately with others - No medications needed.  Problem Solving Problem solving assist level: Solves complex 90% of the time/cues < 10% of the time  Memory Memory assist level: Recognizes or recalls 90% of the time/requires cueing < 10% of the time   Medical Problem List and Plan: 1.Paraplegia and funtional deficitssecondary to T8 meningioma/resection with myeopathy -recent left wrist fracture -Continue CIR PT OT 2. 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   -Last platelets 114 K on 5/23  -follow serially 3. Pain Management:Hydrocodone prn 4. Mood:LCSW to follow for evaluation and support. 5.  Neuropsych: This patientiscapable of making decisions on herown behalf. 6. Skin/Wound Care:Routine pressure relief measures. 7. Fluids/Electrolytes/Nutrition:Monitor I/O.    -potassium 4.3 5/25  -mg 1.4 will supplement  --continue labs  --increase clear liquid intake 8. Post op ileus/neurogenic bowel:    -advance clear liquid intake to soft mech  -KUB with improvement, cecal dilation has improved quite a bit   -continue NGT to suction at night, clamping during day  -continue IV reglan  -encourage OOB to tolerance  -GI has signed off 9.Neurogenic bladder:foley out today, voiding trial/caths  -E coli UTI--resolved Rocephin  completed 10. GERD:managed withPPIbid. 11. Hyponatremia:    -hctz stopped  -continue D5NS IV  12.Leftwrist fracture: NWB. [place]   LOS (Days) 18 A FACE TO FACE EVALUATION WAS PERFORMED  Charlett Blake, MD 04/08/2018 1:57 PM

## 2018-04-09 LAB — BASIC METABOLIC PANEL
Anion gap: 7 (ref 5–15)
BUN: <5 mg/dL — ABNORMAL LOW (ref 6–20)
CO2: 25 mmol/L (ref 22–32)
Calcium: 8.7 mg/dL — ABNORMAL LOW (ref 8.9–10.3)
Chloride: 100 mmol/L — ABNORMAL LOW (ref 101–111)
Creatinine, Ser: 0.4 mg/dL — ABNORMAL LOW (ref 0.44–1.00)
GFR calc Af Amer: >60 mL/min (ref >60)
GFR calc non Af Amer: >60 mL/min (ref >60)
Glucose, Bld: 127 mg/dL — ABNORMAL HIGH (ref 65–99)
Potassium: 3.7 mmol/L (ref 3.5–5.1)
Sodium: 132 mmol/L — ABNORMAL LOW (ref 135–145)

## 2018-04-10 ENCOUNTER — Encounter: Payer: Self-pay | Admitting: Family Medicine

## 2018-04-10 ENCOUNTER — Inpatient Hospital Stay (HOSPITAL_COMMUNITY): Payer: 59

## 2018-04-10 ENCOUNTER — Inpatient Hospital Stay (HOSPITAL_COMMUNITY): Payer: 59 | Admitting: Physical Therapy

## 2018-04-10 LAB — CBC
HCT: 34.6 % — ABNORMAL LOW (ref 36.0–46.0)
Hemoglobin: 11.4 g/dL — ABNORMAL LOW (ref 12.0–15.0)
MCH: 29.5 pg (ref 26.0–34.0)
MCHC: 32.9 g/dL (ref 30.0–36.0)
MCV: 89.6 fL (ref 78.0–100.0)
Platelets: 155 10*3/uL (ref 150–400)
RBC: 3.86 MIL/uL — ABNORMAL LOW (ref 3.87–5.11)
RDW: 15 % (ref 11.5–15.5)
WBC: 3.6 10*3/uL — ABNORMAL LOW (ref 4.0–10.5)

## 2018-04-10 LAB — MAGNESIUM: Magnesium: 1.1 mg/dL — ABNORMAL LOW (ref 1.7–2.4)

## 2018-04-10 LAB — BASIC METABOLIC PANEL
Anion gap: 7 (ref 5–15)
BUN: <5 mg/dL — ABNORMAL LOW (ref 6–20)
CO2: 27 mmol/L (ref 22–32)
Calcium: 8.6 mg/dL — ABNORMAL LOW (ref 8.9–10.3)
Chloride: 104 mmol/L (ref 101–111)
Creatinine, Ser: 0.44 mg/dL (ref 0.44–1.00)
GFR calc Af Amer: >60 mL/min (ref >60)
GFR calc non Af Amer: >60 mL/min (ref >60)
Glucose, Bld: 116 mg/dL — ABNORMAL HIGH (ref 65–99)
Potassium: 3.7 mmol/L (ref 3.5–5.1)
Sodium: 138 mmol/L (ref 135–145)

## 2018-04-10 MED ORDER — PANTOPRAZOLE SODIUM 40 MG PO TBEC
40.0000 mg | DELAYED_RELEASE_TABLET | Freq: Two times a day (BID) | ORAL | Status: DC
  Administered 2018-04-10 – 2018-04-21 (×23): 40 mg via ORAL
  Filled 2018-04-10 (×24): qty 1

## 2018-04-10 MED ORDER — MAGNESIUM OXIDE 400 (241.3 MG) MG PO TABS
400.0000 mg | ORAL_TABLET | Freq: Two times a day (BID) | ORAL | Status: DC
  Administered 2018-04-10: 400 mg via ORAL
  Filled 2018-04-10: qty 1

## 2018-04-10 MED ORDER — METOCLOPRAMIDE HCL 5 MG PO TABS
10.0000 mg | ORAL_TABLET | Freq: Four times a day (QID) | ORAL | Status: DC
  Administered 2018-04-10 – 2018-04-14 (×16): 10 mg via ORAL
  Filled 2018-04-10 (×16): qty 2

## 2018-04-10 NOTE — Progress Notes (Signed)
Occupational Therapy Session Note  Patient Details  Name: Hannah Jefferson MRN: [id number] Date of Birth: 1956/09/07  Today's Date: 04/10/2018 OT Individual Time: 3845-3646 OT Individual Time Calculation (min): 60 min    Short Term Goals: Week 3:  OT Short Term Goal 1 (Week 3): Pt will be able to sit to stand with max A of 1 to prepare for LB self care. OT Short Term Goal 2 (Week 3): Pt will be able to squat pivot to w/c with max A of 1.  OT Short Term Goal 3 (Week 3): Pt will use AE to don pants over feet with mod A.  Skilled Therapeutic Interventions/Progress Updates:    Pt resting in bed upon arrival and agreeable to therapy.  OT intervention with focus on dressing with sit<>stand from EOB to increase independence with BADLs.   Pt required mod A for supine>sit EOB but was able to move BLE off EOB without assistance.  Pt donned shirt while seated unsupported EOB.  Pt threaded BLE into pants using reacher but required assistance with pulling up pants while standing in [place].  Pt completed grooming tasks and washing/drying hair seated in w/c at sink.  Pt remained in w/c with sister-in-law present and all needs within reach.   Therapy Documentation Precautions:  Precautions Precautions: Fall, Back Precaution Comments: LUE NWB in splint Restrictions Weight Bearing Restrictions: Yes LUE Weight Bearing: Non weight bearing    Pain: Pain Assessment Pain Scale: 0-10 Pain Score: 0-No pain  See Function Navigator for Current Functional Status.   Therapy/Group: Individual Therapy  [name] 04/10/2018, 11:29 AM

## 2018-04-10 NOTE — Progress Notes (Signed)
PHARMACIST - PHYSICIAN COMMUNICATION  CONCERNING: IV to Oral Route Change Policy  RECOMMENDATION: This patient is receiving pantoprazole by the intravenous route.  Based on criteria approved by the Pharmacy and Therapeutics Committee, the intravenous medication(s) is/are being converted to the equivalent oral dose form(s).  DESCRIPTION: These criteria include:  The patient is eating (either orally or via tube) and/or has been taking other orally administered medications for a least 24 hours  The patient has no evidence of active gastrointestinal bleeding or impaired GI absorption (gastrectomy, short bowel, patient on TNA or NPO).  If you have questions about this conversion, please contact the Pharmacy Department  []   [phone] )  [facility] []   [phone] )  [facility] [x]   [phone] )  [facility] []   [phone] )  [facility] []   [phone] )  Holmes. Gerarda Fraction, PharmD PGY1 Pharmacy Resident Pager: [phone] 04/10/2018 9:45 AM

## 2018-04-10 NOTE — Progress Notes (Signed)
[place] PHYSICAL MEDICINE & REHABILITATION     PROGRESS NOTE    Subjective/Complaints: Large incontinent stool last night.  No abdominal pain.  Has been tolerating D3 diet.    ROS: Denies chest pain shortness of breath nausea vomiting diarrhea  Objective: Vital Signs: Blood pressure 124/74, pulse 91, temperature 97.9 F (36.6 C), temperature source Oral, resp. rate 18, height 5\' 4"  (1.626 m), weight 102 kg (224 lb 13.9 oz), SpO2 96 %. No results found. Recent Labs    04/10/18 0512  WBC 3.6*  HGB 11.4*  HCT 34.6*  PLT 155   Recent Labs    04/09/18 0345 04/10/18 0512  NA 132* 138  K 3.7 3.7  CL 100* 104  GLUCOSE 127* 116*  BUN <5* <5*  CREATININE 0.40* 0.44  CALCIUM 8.7* 8.6*   CBG (last 3)  No results for input(s): GLUCAP in the last 72 hours.  Wt Readings from Last 3 Encounters:  04/05/18 102 kg (224 lb 13.9 oz)  03/20/18 93.4 kg (206 lb)  02/15/18 94.1 kg (207 lb 8 oz)    Physical Exam:  Constitutional: No distress . Vital signs reviewed. HEENT: EOMI, oral membranes moist Neck: supple Cardiovascular: RRR without murmur. No JVD    Respiratory: CTA Bilaterally without wheezes or rales. Normal effort    GI: BS present but not overly active, non-tender, non-distended, no evidence of high-pitched bowel sounds. Musculoskeletal: She exhibits noedema.  Neurological: Motor RUE 5/5. LUE intact where testable.  Decreased sensation to LT and proprioception, left greater than right brelow T8 dermatome. LE motor 2+ to 3/5 HF, KE and 3/5---stable neuro exam.  ADF/PF. DTR's 1+. No resting tone.stable Skin: Back incision CDI.  PICC LUE intact Psychiatric:  pleasant  Assessment/Plan: 1. Paraplegia and functional deficits secondary to T8 myelopathy, left wrist fracture which require 3+ hours per day of interdisciplinary therapy in a comprehensive inpatient rehab setting. Physiatrist is providing close team supervision and 24 hour management of active  medical problems listed below. Physiatrist and rehab team continue to assess barriers to discharge/monitor patient progress toward functional and medical goals.  Function:  Bathing Bathing position Bathing activity did not occur: N/A Position: Sitting EOB  Bathing parts Body parts bathed by patient: Right arm, Left arm, Chest, Abdomen, Right upper leg Body parts bathed by helper: Front perineal area, Buttocks, Left lower leg, Right lower leg  Bathing assist Assist Level: Assistive device, 2 helpers Assistive Device Comment: Geologist, engineering Dressing/Undressing Upper body dressing Upper body dressing/undressing activity did not occur: N/A What is the patient wearing?: Pull over shirt/dress Bra - Perfomed by patient: Thread/unthread left bra strap, Thread/unthread right bra strap Bra - Perfomed by helper: Thread/unthread right bra strap, Thread/unthread left bra strap, Hook/unhook bra (pull down sports bra) Pull over shirt/dress - Perfomed by patient: Thread/unthread right sleeve, Thread/unthread left sleeve, Pull shirt over trunk Pull over shirt/dress - Perfomed by helper: Put head through opening Button up shirt - Perfomed by patient: Thread/unthread right sleeve, Thread/unthread left sleeve, Button/unbutton shirt Button up shirt - Perfomed by helper: Pull shirt around back    Upper body assist Assist Level: Touching or steadying assistance(Pt > 75%)      Lower Body Dressing/Undressing Lower body dressing Lower body dressing/undressing activity did not occur: N/A What is the patient wearing?: Pants, Socks, Shoes     Pants- Performed by patient: Thread/unthread left pants leg Pants- Performed by helper: Thread/unthread right pants leg, Pull pants up/down  Non-skid slipper socks- Performed by helper: Don/doff right sock, Don/doff left sock   Socks - Performed by helper: Don/doff right sock, Don/doff left sock   Shoes - Performed by helper: Don/doff right shoe, Don/doff left  shoe, Fasten right, Fasten left   AFO - Performed by helper: Don/doff left AFO      Lower body assist Assist for lower body dressing: 2 Helpers      Toileting Toileting Toileting activity did not occur: Safety/medical concerns   Toileting steps completed by helper: Adjust clothing prior to toileting, Performs perineal hygiene, Adjust clothing after toileting Toileting Assistive Devices: Other (comment)(stedy)  Toileting assist Assist level: Two helpers   Transfers Chair/bed transfer   Chair/bed transfer method: Other Chair/bed transfer assist level: 2 helpers Chair/bed transfer assistive device: Mechanical lift Mechanical lift: Ecologist Ambulation activity did not occur: Safety/medical concerns         Wheelchair   Type: Manual Max wheelchair distance: 150 Assist Level: Touching or steadying assistance (Pt > 75%)  Cognition Comprehension Comprehension assist level: Follows complex conversation/direction with no assist  Expression Expression assist level: Expresses complex 90% of the time/cues < 10% of the time  Social Interaction Social Interaction assist level: Interacts appropriately with others - No medications needed.  Problem Solving Problem solving assist level: Solves complex 90% of the time/cues < 10% of the time  Memory Memory assist level: Recognizes or recalls 90% of the time/requires cueing < 10% of the time   Medical Problem List and Plan: 1.Paraplegia and funtional deficitssecondary to T8 meningioma/resection with myeopathy -recent left wrist fracture -Continue CIR PT OT 2. 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    -follow serially 3. Pain Management:Hydrocodone prn 4. Mood:LCSW to follow for evaluation and support. 5. Neuropsych: This patientiscapable of  making decisions on herown behalf. 6. Skin/Wound Care:Routine pressure relief measures. 7. Fluids/Electrolytes/Nutrition:Monitor I/O.    -potassium 3.7 5/27  -mg 1.1 will increase supplement to 400mg  BID  --continue labs  --increase clear liquid intake 8. Post op ileus/neurogenic bowel:    -advance clear liquid intake to soft mech  -KUB with improvement, cecal dilation has improved quite a bit   -Discontinue NGT  -continue IV reglan  -encourage OOB to tolerance  -GI has signed off 9.Neurogenic bladder:foley out bladder incontinence 10. GERD:managed withPPIbid. 11. Hyponatremia:  Resolved   -hctz stopped  -off IVF    12.Leftwrist fracture: NWB. [place]   LOS (Days) 20 A FACE TO FACE EVALUATION WAS PERFORMED  Charlett Blake, MD 04/10/2018 10:10 AM

## 2018-04-10 NOTE — Progress Notes (Signed)
Physical Therapy Note  Patient Details  Name: Hannah Jefferson MRN: [id number] Date of Birth: [birth year] Today's Date: 04/10/2018  1045-1130, 45 min individual tx Pain: none per pt  neuromuscular re-education via forced use, visual feedback, multimodal cues for alternating reciprocal movement x bil LEs, focusing on neutral hip alignment, x 25 cycles, x 3.  Pt tends to hold breath during exertion.  PT instructed in diaphragmatic breathing, using quick sniffs to activate diaphragm, with good results.  Sit> stand in parallel bars, pulling up with R hand, L ankle air cast donned.  Pt stood during R><L wt shifting x 20 cycles with mod cues for hip extension, mod assist. One 2nd trail, pt pushed up on R armrest, but shifted wt excessively with LLE, caused R knee to buckle and R ankle to start to roll over; pt sat quickly in w/c.  3rd trial, pt as per 1st, with wt shifting x 20 cycles.   Seated exercise for core and upper body strengthening: trunk flexion/extension focusing on neutral spine alignment, x 10 x 2.  Bil UE use of 2# weighted bar to tap beach ball x 10, in upright sitting posture, max cues to maintain posture.  Pt left resting in w/c with quick release belt applied and all needs within reach.  See function navigator for current status.  Holden Maniscalco 04/10/2018, 8:29 AM

## 2018-04-10 NOTE — Progress Notes (Signed)
Occupational Therapy Note  Patient Details  Name: Hannah Jefferson MRN: [id number] Date of Birth: 10-May-1956  Today's Date: 04/10/2018 OT Individual Time: 1130-1212 OT Individual Time Calculation (min): 42 min   Pt denies pain Individual Therapy  Pt resting in w/c upon arrival with sister-in-law present.  Pt requested to use toilet/BSC. Pt performed sit>stand from w/c with Stedy and transferred to [facility]. Pt required min A for sit>stand from w/c.  Pt performed sit>stand from [facility] with contact guard.  Pt required assistance with toilet hygiene and clothing management.  Pt unable to detect if she voided.  Pt voided 150 ml urine. Pt requested to return to bed and rest. Pt required max A for sit>supine in bed and repositioning. Focus on activity tolerance, sit<>stand, standing balance in [place], toileting, and safety awareness to increase independence with BADLs.    Leotis Shames [facility] 04/10/2018, 12:15 PM

## 2018-04-10 NOTE — Progress Notes (Signed)
Physical Therapy Weekly Progress Note  Patient Details  Name: Hannah Jefferson MRN: [id number] Date of Birth: [birth year]  Beginning of progress report period: Mar 30, 2018 End of progress report period: Apr 10, 2018  Today's Date: 04/10/2018 PT Individual Time: 1400-1445 PT Individual Time Calculation (min): 45 min   Patient has met 1 of 5 short term goals.  Pt continues to make slow progress with therapy and is limited by significant debility from prolonged illness/bedrest and medical complications.  She has progressed with using stedy for transfers, however continues to require +2 to stand from lower surfaces due to poor coordination and ?kinesthetic awareness.  She continues to demo very poor endurance for mobility and requires frequent rest breaks throughout each session; however, she remains highly motivated to participate and push herself in each therapy session, often requiring therapist reminders for rest breaks.  Patient continues to demonstrate the following deficits muscle weakness and muscle paralysis, decreased cardiorespiratoy endurance, impaired timing and sequencing, abnormal tone, unbalanced muscle activation and decreased coordination, decreased problem solving, decreased safety awareness and decreased memory and decreased sitting balance, decreased standing balance, decreased postural control, decreased balance strategies and paraplegia and therefore will continue to benefit from skilled PT intervention to increase functional independence with mobility.  Patient progressing toward long term goals..  Continue plan of care.  PT Short Term Goals Week 2:  PT Short Term Goal 1 (Week 2): Pt will demonstrate 1 method of pressure relief in w/c with min cues. PT Short Term Goal 1 - Progress (Week 2): Partly met PT Short Term Goal 2 (Week 2): Pt will propel w/c 25' with supervision. PT Short Term Goal 2 - Progress (Week 2): Not met PT Short Term Goal 3 (Week 2): Pt will transition to  standing with +1 assist in 25% of opportunities with LRAD.   PT Short Term Goal 3 - Progress (Week 2): Met PT Short Term Goal 4 (Week 2): Pt will tolerate supported standing x1 minute for decreased burden of care with ADLs PT Short Term Goal 4 - Progress (Week 2): Progressing toward goal PT Short Term Goal 5 (Week 2): Pt will self direct use of bed features for decreased burden of care with bed mobility.   PT Short Term Goal 5 - Progress (Week 2): Not met Week 3:  PT Short Term Goal 1 (Week 3): Pt will transfer in/out of w/c with +1 assist in 50% of observations.  PT Short Term Goal 2 (Week 3): Pt will initiate standard w/c propulsion for increased UE strength and overall activity tolerance.  PT Short Term Goal 3 (Week 3): Pt will recall and demonstrate lateral leans in w/c to assist with pressure relief.  PT Short Term Goal 4 (Week 3): Pt will tolerate standing with BUE support x1 minute.   Skilled Therapeutic Interventions/Progress Updates:    pt with no c/o pain but reports fatigue which worsens throughout session.  On BSC on PT arrival, and session focus on activity tolerance with functional mobility/ADLs.    Pt completes multiple sit<>stand transfers from [facility] with mod>+2 assist.  She improves with max multimodal cues for foot placement, knee adduction, forward weight shift, and pushing through LEs to stand, but demos poor carryover from trial to trial.  Standing in stedy up to 45 seconds at a time for peri-care and clothing management.  Pt able to tolerate a total of 15 minutes of supported standing in stedy for peri-care/clothing management/etc.  Pt with small continent BM on BSC, and  several incontinent BMs throughout remainder of session.  Pt noted to be looking paler after removal of pants and states she's starting to feel bad.  When seated EOB, pt noted to be shaking and states she really feels bad/tired, returned to supine with mod for LEs.  Pt with another incontinent BM on bed.  Rolling  L/R with cues for use of UEs and LEs to assist.  Pt cleansed with total assist and brief donned total assist.  Positioned to comfort with call bell in reach and needs met.   Therapy Documentation Precautions:  Precautions Precautions: Fall, Back Precaution Comments: LUE NWB in splint Restrictions Weight Bearing Restrictions: Yes LUE Weight Bearing: Non weight bearing General: PT Amount of Missed Time (min): 15 Minutes PT Missed Treatment Reason: Patient fatigue   See Function Navigator for Current Functional Status.  Therapy/Group: Individual Therapy  Michel Santee 04/10/2018, 4:53 PM

## 2018-04-10 NOTE — Plan of Care (Signed)
  Problem: Consults Goal: RH SPINAL CORD INJURY PATIENT EDUCATION Description  See Patient Education module for education specifics.  Outcome: Progressing   Problem: SCI BOWEL ELIMINATION Goal: RH STG MANAGE BOWEL WITH ASSISTANCE Description STG Manage Bowel with mod Assistance.   Outcome: Progressing Goal: RH STG SCI MANAGE BOWEL PROGRAM W/ASSIST OR AS APPROPRIATE Description STG SCI Manage bowel program w/mod assist or as appropriate.   Outcome: Progressing   Problem: SCI BLADDER ELIMINATION Goal: RH STG MANAGE BLADDER WITH ASSISTANCE Description STG Manage Bladder With mod Assistance   04/10/2018 0801 by Brita Romp, RN Outcome: Progressing 04/10/2018 0800 by Brita Romp, RN Reactivated Goal: RH STG MANAGE BLADDER WITH EQUIPMENT WITH ASSISTANCE Description STG Manage Bladder With Equipment With mod Assistance   04/10/2018 0801 by Brita Romp, RN Outcome: Progressing 04/10/2018 0800 by Brita Romp, RN Reactivated   Problem: RH SKIN INTEGRITY Goal: RH STG SKIN FREE OF INFECTION/BREAKDOWN Description min   04/10/2018 0801 by Brita Romp, RN Outcome: Progressing 04/10/2018 0801 by Brita Romp, RN Reactivated   Problem: RH SAFETY Goal: RH STG ADHERE TO SAFETY PRECAUTIONS W/ASSISTANCE/DEVICE Description STG Adhere to Safety Precautions With min Assistance/Device.  Outcome: Progressing   Problem: RH PAIN MANAGEMENT Goal: RH STG PAIN MANAGED AT OR BELOW PT'S PAIN GOAL Description 2 or less  Outcome: Progressing

## 2018-04-11 ENCOUNTER — Inpatient Hospital Stay (HOSPITAL_COMMUNITY): Payer: 59 | Admitting: Physical Therapy

## 2018-04-11 ENCOUNTER — Inpatient Hospital Stay (HOSPITAL_COMMUNITY): Payer: 59 | Admitting: *Deleted

## 2018-04-11 LAB — SEROTONIN RELEASE ASSAY (SRA)
SRA .2 IU/mL UFH Ser-aCnc: 65 % — ABNORMAL HIGH (ref 0–20)
SRA 100IU/mL UFH Ser-aCnc: 5 % (ref 0–20)

## 2018-04-11 LAB — BASIC METABOLIC PANEL
Anion gap: 7 (ref 5–15)
BUN: <5 mg/dL — ABNORMAL LOW (ref 6–20)
CO2: 28 mmol/L (ref 22–32)
Calcium: 8.9 mg/dL (ref 8.9–10.3)
Chloride: 101 mmol/L (ref 101–111)
Creatinine, Ser: 0.51 mg/dL (ref 0.44–1.00)
GFR calc Af Amer: >60 mL/min (ref >60)
GFR calc non Af Amer: >60 mL/min (ref >60)
Glucose, Bld: 105 mg/dL — ABNORMAL HIGH (ref 65–99)
Potassium: 3.3 mmol/L — ABNORMAL LOW (ref 3.5–5.1)
Sodium: 136 mmol/L (ref 135–145)

## 2018-04-11 LAB — MAGNESIUM: Magnesium: 0.9 mg/dL — CL (ref 1.7–2.4)

## 2018-04-11 MED ORDER — POTASSIUM CHLORIDE 10 MEQ/100ML IV SOLN
10.0000 meq | INTRAVENOUS | Status: AC
  Administered 2018-04-11 (×5): 10 meq via INTRAVENOUS
  Filled 2018-04-11 (×5): qty 100

## 2018-04-11 MED ORDER — MAGNESIUM OXIDE 400 (241.3 MG) MG PO TABS
400.0000 mg | ORAL_TABLET | Freq: Two times a day (BID) | ORAL | Status: DC
  Administered 2018-04-11 – 2018-04-13 (×5): 400 mg via ORAL
  Filled 2018-04-11 (×5): qty 1

## 2018-04-11 MED ORDER — PRO-STAT SUGAR FREE PO LIQD: 30.0000 mL | Freq: Two times a day (BID) | ORAL | Status: DC

## 2018-04-11 MED ORDER — MUSCLE RUB 10-15 % EX CREA
TOPICAL_CREAM | CUTANEOUS | Status: DC | PRN
  Administered 2018-04-26: 09:00:00 via TOPICAL
  Filled 2018-04-11: qty 85

## 2018-04-11 MED ORDER — MAGNESIUM SULFATE 2 GM/50ML IV SOLN
2.0000 g | Freq: Once | INTRAVENOUS | Status: DC
  Filled 2018-04-11: qty 50

## 2018-04-11 MED ORDER — POTASSIUM CHLORIDE CRYS ER 20 MEQ PO TBCR
40.0000 meq | EXTENDED_RELEASE_TABLET | Freq: Three times a day (TID) | ORAL | Status: DC
Start: 2018-04-11 — End: 2018-04-12
  Administered 2018-04-11 – 2018-04-12 (×4): 40 meq via ORAL
  Filled 2018-04-11 (×4): qty 2

## 2018-04-11 NOTE — Progress Notes (Signed)
Nutrition Follow-up  DOCUMENTATION CODES:   Obesity unspecified  INTERVENTION:  Discontinue Boost Breeze.  Provide 30 ml Prostat po BID, each supplement provides 100 kcal and 15 grams of protein.   Encourage adequate PO intake.   NUTRITION DIAGNOSIS:   Inadequate oral intake related to altered GI function as evidenced by (post op ileus, liquid diet); improving  GOAL:   Patient will meet greater than or equal to 90% of their needs; progressing  MONITOR:   PO intake, Diet advancement, Weight trends, Supplement acceptance, Labs, Skin, I & O's  REASON FOR ASSESSMENT:   Low Braden    ASSESSMENT:   62 year old female with history of hypertension, gait disorder with falls weakness and decreased sensation left lower extremity greater than right lower extremity since October 2018. Work-up revealed T7-T8 mass with marked cord compression and large enhancing tail emanating from dural based mass extending around the cord.  She did sustain a fall prior to her evaluation and required ORIF left wrist on 03/04/2018 by Dr. Caralyn Guile.   She was evaluated by [facility] and underwent T7-T8 tumor resection   NGT removed yesterday. Pt is currently on a dysphagia 3 diet with thin liquids. Meal completion has been 50%. Pt reports good appetite with no abdominal discomfort. Pt currently Boost Breeze ordered and has been refusing them reporting dislike of taste. Pt reports she is unable to tolerate milk products. RD to order Prostat instead to aid in caloric and protein needs. Family at bedside has been encouraging PO intake.   Labs and medications reviewed.   Diet Order:   Diet Order           DIET DYS 3 Room service appropriate? Yes; Fluid consistency: Thin  Diet effective now          EDUCATION NEEDS:   Not appropriate for education at this time  Skin:  Skin Assessment: Skin Integrity Issues: Skin Integrity Issues:: Incisions Incisions: back, neck  Last BM:  5/27  Height:    Ht Readings from Last 1 Encounters:  03/21/18 5\' 4"  (1.626 m)    Weight:   Wt Readings from Last 1 Encounters:  04/05/18 224 lb 13.9 oz (102 kg)    Ideal Body Weight:  54.5 kg  BMI:  Body mass index is 38.6 kg/m.  Estimated Nutritional Needs:   Kcal:  1850-2000  Protein:  85-100 grams  Fluid:  1.8 - 2 L/day    Corrin Parker, MS, RD, LDN Pager # [phone] After hours/ weekend pager # [phone]

## 2018-04-11 NOTE — Progress Notes (Addendum)
Pharmacy Heparin Induced Thrombocytopenia (HIT) Note:  Hannah Jefferson is a 62 y.o. female being evaluated for HIT. Heparin was started 03/26/18 (also noted on lovenox 40mg  q24hr from 5/7-5/11) for acute VTE, and baseline platelets were 290K on 5/11. HIT labs were ordered on 04/03/18 when platelets dropped to 84K.   Heparin Induced Plt Ab  Date/Time Value Ref Range Status  04/03/2018 06:12 PM 2.866 (H) 0.000 - 0.400 OD Final    Comment:    (NOTE) Performed At: [facility] [place], [place] [id number] Rush Farmer MD [id number] Performed at [facility], [place] [address] [place], [place] [address]    SRA .2 IU/mL UFH Ser-aCnc  Date/Time Value Ref Range Status  04/05/2018 05:06 AM 65 (H) 0 - 20 % Final    Comment:    (NOTE) This test was developed and its performance characteristics determined by [facility]. It has not been cleared or approved by the Food and Drug Administration.    SRA 100IU/mL UFH Ser-aCnc  Date/Time Value Ref Range Status  04/05/2018 05:06 AM 5 0 - 20 % Final    Comment:    (NOTE) This test was developed and its performance characteristics determined by [facility]. It has not been cleared or approved by the Food and Drug Administration.      Score = 0 Score = 1 Score = 2 Calculated Score  Thrombocytopenia -Platelet fall < 30% -Any platelet fall with nadir < 10 -Platelet fall >50% BUT surgery within 3 days  -Any combination of platelet fall/nadir that does not fit score 0 or 2 -Platelet fall >50% AND nadir ?20 AND no surgery within 3 days   2  Timing -Platelets fall on days ? 4  AND no prior heparin exposure in previous 100 days -Consistent w/ platelet fall on days 5-10 but missing counts -Platelet fall within 1 day of start AND prior exposure to heparin within previous 31-100 days -Platelet fall after day 10 -Platelet fall 5-10 days after starting heparin -Platelet fall within 1 day of heparin start AND prior exposure within  previous 5-30 days 2  Thrombosis -None suspected -Recurrent thrombosis in patient receiving therapeutic anticoagulants -Suspected thrombosis (awaiting confirmation via imaging) -Erythematous skin lesions at heparin injection sites -Confirmed new thrombosis -Skin necrosis at injection site -Anaphylactoid reaction to heparin IV bolus -Adrenal hemorrhage 2 (noted on 5/11)  oTher Causes -Probable other cause (see protocol for full list) -Possible other cause evident (see protocol for full list) -No alternative explanation 1  Total Calculated 4T Score:      7   Name of MD Contacted: Reesa Chew, PA  Assessment:  SRA =-65 %, positive. HIT management guidelines recommend for acute DVT to treat with  warfarin 3-6 months for acute DVT, but DOAC also recently approved in this case. I called and discussed SRA results and anticoagulation plans with Algis Liming, PA . She will d/w Dr. Naaman Plummer. Anticoagulation depends on GI, po issues, patient just started eating, NG tube removed, noted blackish drainage. S/p IVC placement doen 04/05/18  Hgb 11.4 and pltc 155k on 04/10/18.   Plan: heparin allergy updated in chart,  SRA  Positive. F/u plan for anticoagulation.  Algis Liming, PA to discuss [facility] plan with Dr. Naaman Plummer.  Thank you for allowing pharmacy to be part of this patients care team.   Nicole Cella, [place] Clinical Pharmacist Pager: [phone]: [phone] 15:30-2215: [place] [phone] 04/11/2018 12:17 PM

## 2018-04-11 NOTE — Progress Notes (Signed)
Subjective:   34 days PO Left distal radius ORIF  Patient reports pain as 2 on 0-10 scale.   Hannah Jefferson is doing very well. She has had minimal pain with the cast and states that it has been comfortable.  She denies numbness or tingling of the hand or wrist.    Objective: Vital signs in last 24 hours: Temp:  [97.8 F (36.6 C)-98.5 F (36.9 C)] 97.8 F (36.6 C) (05/28 1450) Pulse Rate:  [54-103] 103 (05/28 1450) Resp:  [17-18] 18 (05/28 1450) BP: (101-125)/(62-85) 101/62 (05/28 1450) SpO2:  [93 %-98 %] 97 % (05/28 1450)  Intake/Output from previous day: 05/27 0701 - 05/28 0700 In: 720 [P.O.:720] Out: -  Intake/Output this shift: Total I/O In: 480 [P.O.:480] Out: -   Recent Labs    04/10/18 0512  HGB 11.4*  3 Recent Labs    04/10/18 0512  WBC 3.6*  RBC 3.86*  HCT 34.6*  PLT 155   Recent Labs    04/10/18 0512 04/11/18 0425  NA 138 136  K 3.7 3.3*  CL 104 101  CO2 27 28  BUN <5* <5*  CREATININE 0.44 0.51  GLUCOSE 116* 105*  CALCIUM 8.6* 8.9   No results for input(s): LABPT, INR in the last 72 hours.  Alert and oriented x 4 Sensation intact distally Intact pulses distally cast is C/D/I  Able to wiggle all digits without difficulty.  Mild swelling at the MP joints with ecchymosis on either end of the cast. Capillary refill is less than 2 seconds.    Assessment/Plan:    Advance diet  Orders placed to have the cast removed and a wrist brace put on. Wear the wrist brace during all activity. Begin therapy to work on range of motion of the wrist.  Weightbearing as tolerated of the LUE. Continue with pain management as needed.     Hannah Jefferson 04/11/2018, 4:28 PM

## 2018-04-11 NOTE — Progress Notes (Signed)
[place] PHYSICAL MEDICINE & REHABILITATION     PROGRESS NOTE    Subjective/Complaints: Tolerating solids over weekend. Moved bowels twice over weekend. Having some pain along right chest wall. Was up standing yesterday  ROS: Patient denies fever, rash, sore throat, blurred vision, nausea, vomiting, diarrhea, cough, shortness of breath or chest pain, joint or back pain, headache, or mood change.     Objective: Vital Signs: Blood pressure 110/70, pulse 98, temperature 98.5 F (36.9 C), resp. rate 17, height 5\' 4"  (1.626 m), weight 102 kg (224 lb 13.9 oz), SpO2 97 %. No results found. Recent Labs    04/10/18 0512  WBC 3.6*  HGB 11.4*  HCT 34.6*  PLT 155   Recent Labs    04/10/18 0512 04/11/18 0425  NA 138 136  K 3.7 3.3*  CL 104 101  GLUCOSE 116* 105*  BUN <5* <5*  CREATININE 0.44 0.51  CALCIUM 8.6* 8.9   CBG (last 3)  No results for input(s): GLUCAP in the last 72 hours.  Wt Readings from Last 3 Encounters:  04/05/18 102 kg (224 lb 13.9 oz)  03/20/18 93.4 kg (206 lb)  02/15/18 94.1 kg (207 lb 8 oz)    Physical Exam:  Constitutional: No distress . Vital signs reviewed. HEENT: EOMI, oral membranes moist Neck: supple Cardiovascular: RRR without murmur. No JVD    Respiratory: CTA Bilaterally without wheezes or rales. Normal effort    GI: BS +, non-tender, non-distended  Musc: tenderness along middle right ribs/axillary line Neurological: Motor RUE 5/5. LUE intact where testable.  Decreased sensation to LT and proprioception, left greater than right brelow T8 dermatome. LE motor 2+ to 3/5 HF, KE and 3/5---stable neuro exam.  ADF/PF. DTR's 1+. Sustained clonus right ankle.  Skin:    PICC LUE intact Psychiatric:  pleasant  Assessment/Plan: 1. Paraplegia and functional deficits secondary to T8 myelopathy, left wrist fracture which require 3+ hours per day of interdisciplinary therapy in a comprehensive inpatient rehab setting. Physiatrist is  providing close team supervision and 24 hour management of active medical problems listed below. Physiatrist and rehab team continue to assess barriers to discharge/monitor patient progress toward functional and medical goals.  Function:  Bathing Bathing position Bathing activity did not occur: N/A Position: Sitting EOB  Bathing parts Body parts bathed by patient: Right arm, Left arm, Chest, Abdomen, Right upper leg Body parts bathed by helper: Front perineal area, Buttocks, Left lower leg, Right lower leg  Bathing assist Assist Level: Assistive device, 2 helpers Assistive Device Comment: Geologist, engineering Dressing/Undressing Upper body dressing Upper body dressing/undressing activity did not occur: N/A What is the patient wearing?: Pull over shirt/dress Bra - Perfomed by patient: Thread/unthread left bra strap, Thread/unthread right bra strap Bra - Perfomed by helper: Thread/unthread right bra strap, Thread/unthread left bra strap, Hook/unhook bra (pull down sports bra) Pull over shirt/dress - Perfomed by patient: Thread/unthread right sleeve, Thread/unthread left sleeve, Put head through opening, Pull shirt over trunk Pull over shirt/dress - Perfomed by helper: Put head through opening Button up shirt - Perfomed by patient: Thread/unthread right sleeve, Thread/unthread left sleeve, Button/unbutton shirt Button up shirt - Perfomed by helper: Pull shirt around back    Upper body assist Assist Level: Supervision or verbal cues      Lower Body Dressing/Undressing Lower body dressing Lower body dressing/undressing activity did not occur: N/A What is the patient wearing?: Pants, Socks, Shoes     Pants- Performed by  patient: Thread/unthread left pants leg, Thread/unthread right pants leg Pants- Performed by helper: Pull pants up/down   Non-skid slipper socks- Performed by helper: Don/doff right sock, Don/doff left sock   Socks - Performed by helper: Don/doff right sock, Don/doff  left sock   Shoes - Performed by helper: Don/doff right shoe, Don/doff left shoe, Fasten right, Fasten left   AFO - Performed by helper: Don/doff left AFO      Lower body assist Assist for lower body dressing: 2 Helpers      Toileting Toileting Toileting activity did not occur: Safety/medical concerns Toileting steps completed by patient: Performs perineal hygiene Toileting steps completed by helper: Adjust clothing prior to toileting, Adjust clothing after toileting Toileting Assistive Devices: Other (comment)(stedy)  Toileting assist Assist level: Two helpers   Transfers Chair/bed transfer   Chair/bed transfer method: Other Chair/bed transfer assist level: 2 helpers Chair/bed transfer assistive device: Mechanical lift Mechanical lift: Ecologist Ambulation activity did not occur: Safety/medical concerns         Wheelchair   Type: Manual Max wheelchair distance: 150 Assist Level: Touching or steadying assistance (Pt > 75%)  Cognition Comprehension Comprehension assist level: Follows complex conversation/direction with no assist  Expression Expression assist level: Expresses complex 90% of the time/cues < 10% of the time  Social Interaction Social Interaction assist level: Interacts appropriately with others - No medications needed.  Problem Solving Problem solving assist level: Solves complex 90% of the time/cues < 10% of the time  Memory Memory assist level: Recognizes or recalls 90% of the time/requires cueing < 10% of the time   Medical Problem List and Plan: 1.Paraplegia and funtional deficitssecondary to T8 meningioma/resection with myeopathy -recent left wrist fracture -Continue CIR PT OT 2. 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      3. Pain Management:Hydrocodone  prn 4. Mood:LCSW to follow for evaluation and support. 5. Neuropsych: This patientiscapable of making decisions on herown behalf. 6. Skin/Wound Care:Routine pressure relief measures. 7. Fluids/Electrolytes/Nutrition:Monitor I/O.    -potassium 3.3 5/28----add IV potassium today in addition to oral  -mg 0.9----IVPB Mg++ today  -continue mg supplement  400mg  BID  --continue labs  -- DIII thins 8. Post op ileus/neurogenic bowel:    -moving bowels, exam benign  - continue IV reglan  -encourage OOB to tolerance  -replace Electrolytes 9.Neurogenic bladder:foley out bladder incontinence 10. GERD:managed withPPIbid. 11. Hyponatremia:  Resolved   -hctz stopped  -off IVF    12.Leftwrist fracture: NWB. [place]   LOS (Days) [place] EVALUATION WAS PERFORMED  Meredith Staggers, MD 04/11/2018 9:09 AM

## 2018-04-11 NOTE — Progress Notes (Signed)
Physical Therapy Session Note  Patient Details  Name: Hannah Jefferson MRN: [id number] Date of Birth: [birth year]  Today's Date: 04/11/2018 PT Individual Time: 1330-1430 PT Individual Time Calculation (min): 60 min   Short Term Goals: Week 3:  PT Short Term Goal 1 (Week 3): Pt will transfer in/out of w/c with +1 assist in 50% of observations.  PT Short Term Goal 2 (Week 3): Pt will initiate standard w/c propulsion for increased UE strength and overall activity tolerance.  PT Short Term Goal 3 (Week 3): Pt will recall and demonstrate lateral leans in w/c to assist with pressure relief.  PT Short Term Goal 4 (Week 3): Pt will tolerate standing with BUE support x1 minute.   Skilled Therapeutic Interventions/Progress Updates:   Pt in supine and agreeable to therapy, RN present performing pericare. Therapist provided skilled assist w/ rolling and LE garment management. Mod assist to roll each direction multiple times, total assist to don LE garments and bring over hips. Verbal and manual cues for rolling technique. Transferred to EOB w/ mod assist and donned shoes total assist. Transferred to TIS via stedy w/ mod assist x1 to boost into standing. Pt reporting episodes of low BP and feeling "clammy" w/ therapies earlier today, requesting to start out w/ warm up on kinetron. Performed kinetron @ 90 cm/sec on and off 30 sec x5-6 bouts. She did not require any manual assist to perform. Transported to therapy gym and performed sit<>stand x4 reps in parallel bars w/ mod assist x2 at first fading to a light mod assist x1 during last rep. Tactile and manual assist to engage quad musculature during transition and min assist to maintain standing for 10-15 sec at a time. Pt limited in standing tolerance by feeling "lightheaded" which resolves w/ returning to sit. Returned to room and transferred back to supine via same technique as above 2/2 pt reporting she had incontinent BM in brief. Ended session in supine, call  bell within reach and all needs met. NT agreeable to assist w/ changing brief.   Therapy Documentation Precautions:  Precautions Precautions: Fall, Back Precaution Comments: LUE NWB in splint Restrictions Weight Bearing Restrictions: Yes LUE Weight Bearing: Non weight bearing  See Function Navigator for Current Functional Status.   Therapy/Group: Individual Therapy  Jamilet Ambroise K Arnette 04/11/2018, 2:42 PM

## 2018-04-11 NOTE — Progress Notes (Signed)
This morning at approximately 6am, the lab called in a critical lab result. The on-call provider was called & verbal orders were given for IV magnesium & oral potassium change in present dosage. Orders were transcribed & patient made aware of new orders & rationale. When medication was sent by pharmacy, attempted to start IV, but PICC line would not flush. IV team was consulted. After IV team came, again tried to restart the medication & again medication lumen would not flush. IV team was consulted again & report given to the oncoming nurse.No acute distress was noted.

## 2018-04-11 NOTE — Progress Notes (Addendum)
Physical Therapy Session Note  Patient Details  Name: Hannah Jefferson MRN: [id number] Date of Birth: [birth year]  Today's Date: 04/11/2018 PT Individual Time: 1000-1100 PT Individual Time Calculation (min): 60 min   Short Term Goals: Week 3:  PT Short Term Goal 1 (Week 3): Pt will transfer in/out of w/c with +1 assist in 50% of observations.  PT Short Term Goal 2 (Week 3): Pt will initiate standard w/c propulsion for increased UE strength and overall activity tolerance.  PT Short Term Goal 3 (Week 3): Pt will recall and demonstrate lateral leans in w/c to assist with pressure relief.  PT Short Term Goal 4 (Week 3): Pt will tolerate standing with BUE support x1 minute.   Skilled Therapeutic Interventions/Progress Updates:    pt with no c/o pain, states she "slid out" again with OT this morning, but was feeling a little better.  Session focus on increasing activity tolerance, LE strength, and core strength through functional mobility and therex.    Total assist for lower body dressing and donning air cast and shoes, rolling with min assist and bed features.  Supine>sit with bed rails, and overall min assist with mod multimodal cues for sequencing and use of DME.  Sit<>stand in stedy with mod assist +1 and mod multimodal cues for body mechanics.  Pt only able to tolerate about 2 minutes standing in stedy before becoming fatigued, but able to perform 8 reps terminal hip extension in stedy with mod multimodal cues.    From tilt in space chair, focus on forward weight shift and abdominal activation with 2# dowel rod to tap therapist's hand and controlled descent back to chair.  Pt able to complete 2x6+3 reps before feeling faint.  BP assessed and 116/78, O2 sats 98%, HR 84.  With rest break and cool cloth to forehead pt reports feeling better.  3x20 seconds heel cord stretch and hamstring stretch on LLE, and 8-10 reps SAQ and LAQ bilaterally focus on controlled descent for eccentric strengthening.  Pt  returned to room at end of session, call bell in reach and needs met.    Therapy Documentation Precautions:  Precautions Precautions: Fall, Back Precaution Comments: LUE NWB in splint Restrictions Weight Bearing Restrictions: Yes LUE Weight Bearing: Non weight bearing   See Function Navigator for Current Functional Status.   Therapy/Group: Individual Therapy  Michel Santee 04/11/2018, 11:04 AM

## 2018-04-11 NOTE — Progress Notes (Signed)
Occupational Therapy Session Note  Patient Details  Name: Hannah Jefferson MRN: [id number] Date of Birth: 12-24-1955  Today's Date: 04/11/2018 OT Individual Time: 0700-0800 OT Individual Time Calculation (min): 60 min    Short Term Goals: Week 3:  OT Short Term Goal 1 (Week 3): Pt will be able to sit to stand with max A of 1 to prepare for LB self care. OT Short Term Goal 2 (Week 3): Pt will be able to squat pivot to w/c with max A of 1.  OT Short Term Goal 3 (Week 3): Pt will use AE to don pants over feet with mod A.  Skilled Therapeutic Interventions/Progress Updates:    OT intervention with focus on bed mobility, sit<>stand with Stedy, toileting, bathing/dressing seated, and activity tolerance to increase independence with BADLs. Pt requested to use BSC and transferred with [facility].  Pt completed UB bathing/dressing tasks seated on BSC.  Pt completed grooming tasks seated on BSC.  Pt c/o "not feeling well" and requested to return to bed.  Pt required max A for sit>supine in bed and reposition.  BP-101/76, HR-112, and O2 93. Pt remained in bed with sister-in-law present.  Therapy Documentation Precautions:  Precautions Precautions: Fall, Back Precaution Comments: LUE NWB in splint Restrictions Weight Bearing Restrictions: Yes LUE Weight Bearing: Non weight bearing Pain:  Pt denies pain  See Function Navigator for Current Functional Status.   Therapy/Group: Individual Therapy  [name] 04/11/2018, 12:09 PM

## 2018-04-12 ENCOUNTER — Inpatient Hospital Stay (HOSPITAL_COMMUNITY): Payer: 59 | Admitting: *Deleted

## 2018-04-12 ENCOUNTER — Inpatient Hospital Stay (HOSPITAL_COMMUNITY): Payer: 59 | Admitting: Occupational Therapy

## 2018-04-12 ENCOUNTER — Inpatient Hospital Stay (HOSPITAL_COMMUNITY): Payer: 59

## 2018-04-12 ENCOUNTER — Inpatient Hospital Stay (HOSPITAL_COMMUNITY): Payer: 59 | Admitting: Physical Therapy

## 2018-04-12 LAB — MAGNESIUM: Magnesium: 1.6 mg/dL — ABNORMAL LOW (ref 1.7–2.4)

## 2018-04-12 LAB — BASIC METABOLIC PANEL
Anion gap: 8 (ref 5–15)
BUN: <5 mg/dL — ABNORMAL LOW (ref 6–20)
CO2: 28 mmol/L (ref 22–32)
Calcium: 9.1 mg/dL (ref 8.9–10.3)
Chloride: 97 mmol/L — ABNORMAL LOW (ref 101–111)
Creatinine, Ser: 0.47 mg/dL (ref 0.44–1.00)
GFR calc Af Amer: >60 mL/min (ref >60)
GFR calc non Af Amer: >60 mL/min (ref >60)
Glucose, Bld: 126 mg/dL — ABNORMAL HIGH (ref 65–99)
Potassium: 4.5 mmol/L (ref 3.5–5.1)
Sodium: 133 mmol/L — ABNORMAL LOW (ref 135–145)

## 2018-04-12 MED ORDER — GENERIC EXTERNAL MEDICATION
Status: DC
Start: ? — End: 2018-04-12

## 2018-04-12 MED ORDER — POTASSIUM CHLORIDE CRYS ER 20 MEQ PO TBCR
40.0000 meq | EXTENDED_RELEASE_TABLET | Freq: Two times a day (BID) | ORAL | Status: DC
  Administered 2018-04-12 – 2018-04-24 (×24): 40 meq via ORAL
  Filled 2018-04-12 (×24): qty 2

## 2018-04-12 MED ORDER — ALUM & MAG HYDROXIDE-SIMETH 200-200-20 MG/5ML PO SUSP: 30.0000 mL | ORAL | Status: DC | PRN

## 2018-04-12 NOTE — Progress Notes (Signed)
Physical Therapy Session Note  Patient Details  Name: Hannah Jefferson MRN: [id number] Date of Birth: [birth year]  Today's Date: 04/12/2018 PT Individual Time: 1100-1200 PT Individual Time Calculation (min): 60 min   Short Term Goals: Week 3:  PT Short Term Goal 1 (Week 3): Pt will transfer in/out of w/c with +1 assist in 50% of observations.  PT Short Term Goal 2 (Week 3): Pt will initiate standard w/c propulsion for increased UE strength and overall activity tolerance.  PT Short Term Goal 3 (Week 3): Pt will recall and demonstrate lateral leans in w/c to assist with pressure relief.  PT Short Term Goal 4 (Week 3): Pt will tolerate standing with BUE support x1 minute.   Skilled Therapeutic Interventions/Progress Updates:    no c/o pain.  Session focus on NMR, standing tolerance, and pre-gait in // bars.  Pt able to transition to standing x3 attempts in // bars with overall min assist, focus on foot placement, upright posture, midline orientation, and pushing through UEs to stand.  First trial focus on standing upright, glute activation, and quad activation.  Second trial focus on weight shifting R and L.  Third trial focus on small marches in place.  Each trial pt requires cues to maintain focus from previous trial in addition to new focus.  Pt feeling faint on third trial, returned to sitting and able to scoot back in chair with min cues.  Symptoms resolved with sitting.  LAQ x10 reps bilaterally with 3# ankle weights, and hamstring curls x10 reps bilaterally with orange theraband.  Pt returned to room at end of session and positioned in tilt in space chair with call bell in reach and needs met.   Therapy Documentation Precautions:  Precautions Precautions: Fall, Back Precaution Comments: LUE NWB in splint Restrictions Weight Bearing Restrictions: Yes LUE Weight Bearing: Non weight bearing   See Function Navigator for Current Functional Status.   Therapy/Group: Individual  Therapy  Michel Santee 04/12/2018, 12:14 PM

## 2018-04-12 NOTE — Progress Notes (Signed)
Orthopedic Tech Progress Note Patient Details:  Hannah Jefferson 10-Feb-1956 [id number]  Casting Type of Cast: Short arm cast Cast Location: lue Cast Material: Fiberglass Cast Intervention: Removal  Post Interventions Patient Tolerated: Well Instructions Provided: Care of device As ordered byPA Haynes Dage, Seva Chancy 04/12/2018, 8:43 AM

## 2018-04-12 NOTE — Progress Notes (Signed)
Social Work Patient ID: Hannah Jefferson, female   DOB: 1956-02-13, 62 y.o.   MRN: [id number]   Have reviewed team conference information with pt and sister-in-law.  Pt pleased with progress this week as she is feeling much better overall.  Both aware and agreeable with targeted d/c date of 6/11, however, sister-in-law then asks, "will she be going home or to a skilled nursing facility then."  I explained to both that the team goal is for a d/c home and that, if they decide they cannot provide the needed 24/7 assistance then they must decide on plan for SNF or hiring caregivers.  Sister-in-law notes that she "can't stay with her the whole time her husband is at work."  They recognize that it would not be safe for pt to be left alone even for a few hours in the morning.  Pt becomes a little tearful as she states, "I get upset with the thought of not going home, but, it may just have to be that way."   Requested that they talk together about how they might be able to cover 24/7 and providing emotional support to pt.  Explained that we would need to make a decision on d/c plans by next week.  Both agreeable.  Yariel Ferraris, LCSW

## 2018-04-12 NOTE — Progress Notes (Signed)
[facility] PHYSICAL MEDICINE & REHABILITATION     PROGRESS NOTE    Subjective/Complaints: Overall feeling well. Does report chest discomfort and reflux after she takes pills. Bowels emptying  ROS: Patient denies fever, rash, sore throat, blurred vision, nausea, vomiting, diarrhea, cough, shortness of breath or chest pain, joint or back pain, headache, or mood change.     Objective: Vital Signs: Blood pressure 115/75, pulse (!) 105, temperature 97.8 F (36.6 C), temperature source Oral, resp. rate 19, height 5\' 4"  (1.626 m), weight 102.1 kg (225 lb 1.4 oz), SpO2 98 %. No results found. Recent Labs    04/10/18 0512  WBC 3.6*  HGB 11.4*  HCT 34.6*  PLT 155   Recent Labs    04/11/18 0425 04/12/18 0429  NA 136 133*  K 3.3* 4.5  CL 101 97*  GLUCOSE 105* 126*  BUN <5* <5*  CREATININE 0.51 0.47  CALCIUM 8.9 9.1   CBG (last 3)  No results for input(s): GLUCAP in the last 72 hours.  Wt Readings from Last 3 Encounters:  04/12/18 102.1 kg (225 lb 1.4 oz)  03/20/18 93.4 kg (206 lb)  02/15/18 94.1 kg (207 lb 8 oz)    Physical Exam:  Constitutional: No distress . Vital signs reviewed. HEENT: EOMI, oral membranes moist Neck: supple Cardiovascular: RRR without murmur. No JVD    Respiratory: CTA Bilaterally without wheezes or rales. Normal effort    GI: BS +, non-tender, non-distended  Musc: tenderness along middle right ribs/axillary line. No sternal tenderness Neurological: Motor RUE 5/5. LUE intact where testable.  Decreased sensation to LT and proprioception, left greater than right brelow T8 dermatome. LE motor 2+ to 3/5 HF, KE and 3/5---stable neuro exam.  ADF/PF. DTR's 1+.    Skin:    PICC LUE intact Psychiatric:  pleasant  Assessment/Plan: 1. Paraplegia and functional deficits secondary to T8 myelopathy, left wrist fracture which require 3+ hours per day of interdisciplinary therapy in a comprehensive inpatient rehab setting. Physiatrist is providing  close team supervision and 24 hour management of active medical problems listed below. Physiatrist and rehab team continue to assess barriers to discharge/monitor patient progress toward functional and medical goals.  Function:  Bathing Bathing position Bathing activity did not occur: N/A Position: Sitting EOB  Bathing parts Body parts bathed by patient: Right arm, Left arm, Chest, Abdomen, Right upper leg Body parts bathed by helper: Front perineal area, Buttocks, Left lower leg, Right lower leg  Bathing assist Assist Level: Assistive device, 2 helpers Assistive Device Comment: Geologist, engineering Dressing/Undressing Upper body dressing Upper body dressing/undressing activity did not occur: N/A What is the patient wearing?: Pull over shirt/dress Bra - Perfomed by patient: Thread/unthread left bra strap, Thread/unthread right bra strap Bra - Perfomed by helper: Thread/unthread right bra strap, Thread/unthread left bra strap, Hook/unhook bra (pull down sports bra) Pull over shirt/dress - Perfomed by patient: Thread/unthread right sleeve, Thread/unthread left sleeve, Put head through opening, Pull shirt over trunk Pull over shirt/dress - Perfomed by helper: Put head through opening Button up shirt - Perfomed by patient: Thread/unthread right sleeve, Thread/unthread left sleeve, Button/unbutton shirt Button up shirt - Perfomed by helper: Pull shirt around back    Upper body assist Assist Level: Supervision or verbal cues      Lower Body Dressing/Undressing Lower body dressing Lower body dressing/undressing activity did not occur: N/A What is the patient wearing?: Pants, Socks, Shoes     Pants- Performed by  patient: Thread/unthread left pants leg, Thread/unthread right pants leg Pants- Performed by helper: Pull pants up/down   Non-skid slipper socks- Performed by helper: Don/doff right sock, Don/doff left sock   Socks - Performed by helper: Don/doff right sock, Don/doff left sock    Shoes - Performed by helper: Don/doff right shoe, Don/doff left shoe, Fasten right, Fasten left   AFO - Performed by helper: Don/doff left AFO      Lower body assist Assist for lower body dressing: 2 Helpers      Toileting Toileting Toileting activity did not occur: Safety/medical concerns Toileting steps completed by patient: Performs perineal hygiene Toileting steps completed by helper: Adjust clothing prior to toileting, Adjust clothing after toileting Toileting Assistive Devices: Other (comment)(stedy)  Toileting assist Assist level: Two helpers   Transfers Chair/bed transfer   Chair/bed transfer method: Other Chair/bed transfer assist level: dependent (Pt equals 0%) Chair/bed transfer assistive device: Mechanical lift Mechanical lift: Stedy   Locomotion Ambulation Ambulation activity did not occur: Safety/medical concerns         Wheelchair   Type: Manual Max wheelchair distance: 150 Assist Level: Touching or steadying assistance (Pt > 75%)  Cognition Comprehension Comprehension assist level: Follows complex conversation/direction with no assist  Expression Expression assist level: Expresses complex 90% of the time/cues < 10% of the time  Social Interaction Social Interaction assist level: Interacts appropriately with others - No medications needed.  Problem Solving Problem solving assist level: Solves basic 90% of the time/requires cueing < 10% of the time  Memory Memory assist level: Recognizes or recalls 90% of the time/requires cueing < 10% of the time   Medical Problem List and Plan: 1.Paraplegia and funtional deficitssecondary to T8 meningioma/resection with myeopathy -recent left wrist fracture, SAC -Continue CIR PT OT 2. 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      3. Pain Management:Hydrocodone  prn 4. Mood:LCSW to follow for evaluation and support. 5. Neuropsych: This patientiscapable of making decisions on herown behalf. 6. Skin/Wound Care:Routine pressure relief measures. 7. Fluids/Electrolytes/Nutrition:Monitor I/O.    -potassium up to 4.5 today, will try to back off PO potassium a bit  -mg 1.6 today  -continue mg supplement  400mg  BID  --continue daily labs  -- DIII thins 8. Post op ileus/neurogenic bowel:    -moving bowels, exam benign  - continue IV reglan  -encourage OOB to tolerance  -replace Electrolytes as above 9.Neurogenic bladder: foley out bladder incontinence  10. GERD:managed withPPIbid. 11. Hyponatremia:  Resolved   -hctz stopped  -off IVF    12.Leftwrist fracture: NWB. [place] 13. Dyspepsia  -protonix  -prn maalox   LOS (Days) [place] EVALUATION WAS PERFORMED  Meredith Staggers, MD 04/12/2018 8:57 AM

## 2018-04-12 NOTE — Progress Notes (Signed)
Physical Therapy Session Note  Patient Details  Name: Hannah Jefferson MRN: [id number] Date of Birth: 1956-08-18  Today's Date: 04/12/2018 PT Individual Time: 4239-5320 PT Individual Time Calculation (min): 30 min   Short Term Goals: Week 3:  PT Short Term Goal 1 (Week 3): Pt will transfer in/out of w/c with +1 assist in 50% of observations.  PT Short Term Goal 2 (Week 3): Pt will initiate standard w/c propulsion for increased UE strength and overall activity tolerance.  PT Short Term Goal 3 (Week 3): Pt will recall and demonstrate lateral leans in w/c to assist with pressure relief.  PT Short Term Goal 4 (Week 3): Pt will tolerate standing with BUE support x1 minute.   Skilled Therapeutic Interventions/Progress Updates:    no c/o pain but reporting episode of urinary incontinence.  Session focus on functional bed mobility with use of bedrails to decrease burden of care, stretching LEs, and education on d/c planning.    Pt rolls L and R with overall min assist, and verbal/tactile cues for positioning of LEs to assist with rolling and maintaining side lying.  Pt incontinent of urine only, and PT provided total assist for changing brief and peri-care.  Stretching to bilateral heel cords 3x30 seconds each, noted bilateral clonus today (12 beats on LLE and 8 on RLE).  Began discussion regarding discharge planning and progress towards PT goals, and provided pt with home measurement sheet.  Left positioned to comfort in bed with call bell in reach and needs met.   Therapy Documentation Precautions:  Precautions Precautions: Fall, Back Precaution Comments: LUE NWB in splint Restrictions Weight Bearing Restrictions: Yes LUE Weight Bearing: Non weight bearing   See Function Navigator for Current Functional Status.   Therapy/Group: Individual Therapy  Michel Santee 04/12/2018, 4:53 PM

## 2018-04-12 NOTE — Progress Notes (Signed)
Occupational Therapy Session Note  Patient Details  Name: Hannah Jefferson MRN: [id number] Date of Birth: Feb 05, 1956  Today's Date: 04/12/2018 OT Individual Time: 0930-1015 OT Individual Time Calculation (min): 45 min    Short Term Goals: Week 1:  OT Short Term Goal 1 (Week 1): Pt will be able to sit to stand with max A of 1 to prepare for LB self care. OT Short Term Goal 1 - Progress (Week 1): Progressing toward goal OT Short Term Goal 2 (Week 1): Pt will be able to squat pivot to w/c with max A of 1.  OT Short Term Goal 2 - Progress (Week 1): Progressing toward goal OT Short Term Goal 3 (Week 1): Pt will don shirt with min A. OT Short Term Goal 3 - Progress (Week 1): Met OT Short Term Goal 4 (Week 1): Pt will use AE to don pants over feet with mod A. OT Short Term Goal 4 - Progress (Week 1): Progressing toward goal Week 2:  OT Short Term Goal 1 (Week 2): Pt will be able to sit to stand with max A of 1 to prepare for LB self care. OT Short Term Goal 1 - Progress (Week 2): Progressing toward goal OT Short Term Goal 2 (Week 2): Pt will be able to squat pivot to w/c with max A of 1.  OT Short Term Goal 2 - Progress (Week 2): Progressing toward goal OT Short Term Goal 3 (Week 2): Pt will use AE to don pants over feet with mod A. OT Short Term Goal 3 - Progress (Week 2): Progressing toward goal Week 3:  OT Short Term Goal 1 (Week 3): Pt will be able to sit to stand with max A of 1 to prepare for LB self care. OT Short Term Goal 2 (Week 3): Pt will be able to squat pivot to w/c with max A of 1.  OT Short Term Goal 3 (Week 3): Pt will use AE to don pants over feet with mod A.      Skilled Therapeutic Interventions/Progress Updates:    Pt received in w/c and discussed plans for the OT session.  Pt felt that she should try to use the bathroom first.  Pt used stedy lift to transfer to [name].  Pt cleansed self after urinating. When she stood to have pants pulled up she had small amount of bowel  leakage and felt she needed to sit to see if more would come out.  Pt had slightly more loose stool.    The first stand from high w/c seat to stedy, min A. 2nd stand from [facility], mod A.  3rd stand - max A of 1 and 4th time to fully pull up pants - max A of 2.  Pt was transferred back to w/c.    Pt briefly worked on a few sets of AROM and isometric holds on knee extensions.   Pt in room with sister in law with her.   Therapy Documentation Precautions:  Precautions Precautions: Fall, Back Precaution Comments: LUE NWB in splint Restrictions Weight Bearing Restrictions: Yes LUE Weight Bearing: Non weight bearing   Pain: Pain Assessment Pain Scale: 0-10 Pain Score: 0-No pain ADL: ADL ADL Comments: refer to functional navigator  See Function Navigator for Current Functional Status.   Therapy/Group: Individual Therapy  [place] 04/12/2018, 8:29 AM

## 2018-04-12 NOTE — Patient Care Conference (Signed)
Inpatient RehabilitationTeam Conference and Plan of Care Update Date: 04/11/2018   Time: 2:00 PM    Patient Name: Hannah Jefferson      Medical Record Number: [id number]  Date of Birth: August 02, 1956 Sex: Female         Room/Bed: 4W11C/4W11C-01 Payor Info: Payor: Theme park manager / Plan: Theme park manager OTHER / Product Type: *No Product type* /    Admitting Diagnosis: T7 8 MENOGIONE  RESECTION  Admit Date/Time:  03/21/2018  1:32 PM Admission Comments: No comment available   Primary Diagnosis:  Thoracic myelopathy Principal Problem: Thoracic myelopathy  Patient Active Problem List   Diagnosis Date Noted  . Ileus, postoperative ([place])   . Thoracic myelopathy 03/21/2018  . Neurogenic bowel 03/21/2018  . Neurogenic bladder 03/21/2018  . Left wrist fracture, sequela 03/21/2018  . Numbness 02/15/2018  . Ataxic gait 02/15/2018  . Left leg weakness 02/15/2018  . Other fatigue 02/15/2018  . Urinary incontinence 02/15/2018  . Bowel incontinence 02/15/2018    Expected Discharge Date: Expected Discharge Date: 04/25/18  Team Members Present: Physician leading conference: Dr. Alger Simons Social Worker Present: Lennart Pall, LCSW Nurse Present: Leonette Nutting, RN PT Present: Dwyane Dee, PT OT Present: Roanna Epley, [place], OT SLP Present: Charolett Bumpers, SLP PPS Coordinator present : Daiva Nakayama, RN, CRRN     Current Status/Progress Goal Weekly Team Focus  Medical   ileus improving. on soft diet. moving bowels. IVCF placed due to HIT, bleeding risks  continue medical mgt as outlined in chart  medical mgt of myelopathy, sequelae   Bowel/Bladder   incontinent of bowel & bladder, LBM 5/27, resolving ileus  less episodes of incontinence  toileting program, incontinence management   Swallow/Nutrition/ Hydration             ADL's   bathing-sitting EOB mod A; UB dressing-min A; LB dressing-max A; functional transfers-Stedy; sit<>stand with PFRW-mod A; standing  balance-mod A  min A overall with LB self care and transfers, s/u UB self care  activity tolerance, bed mobility, sitting balanc, sit<>stand, BADL retraining, education   Mobility   at best, mod assist with stedy for transfers, progressing slowly due to ongoing medical issues   min assist w/c level transfers; mod assist controlled environment gait goals; mod I w/c mobility  increasing endurance, functional transfers without use of lift equipment, NMR, SCI education   Communication             Safety/Cognition/ Behavioral Observations            Pain   c/o pain to her back  pain <3  assess & treat as needed   Skin   MASD to perineum, groin & buttocks, closed incision to back  no new break down  assess q shift    Rehab Goals Patient on target to meet rehab goals: Yes Rehab Goals Revised: medically improving and more able to focus on rehab *See Care Plan and progress notes for long and short-term goals.     Barriers to Discharge  Current Status/Progress Possible Resolutions Date Resolved   Physician    Medical stability        medical mgt as outlined in chart      Nursing                  PT                    OT  SLP                SW                Discharge Planning/Teaching Needs:  Pt to d/c home with spouse and sister-in-law providing 24/7 assistance.  Sister-in-law present for some sessions - teaching continues.   Team Discussion:  Improved medically and tolerating diet (D3) better;  Moving bowels.  Showing some clous in right leg.  Better overall with therapies but fatigues ~ 3/4 way thru tx sessions.  Using STEDY and AE;  Continues to push herself.  Poor sensation is a barrier at times.  Mod assist giat but anticipate min assist w/c level at d/c.  Revisions to Treatment Plan:  NA    Continued Need for Acute Rehabilitation Level of Care: The patient requires daily medical management by a physician with specialized training in physical medicine and  rehabilitation for the following conditions: Daily direction of a multidisciplinary physical rehabilitation program to ensure safe treatment while eliciting the highest outcome that is of practical value to the patient.: Yes Daily medical management of patient stability for increased activity during participation in an intensive rehabilitation regime.: Yes Daily analysis of laboratory values and/or radiology reports with any subsequent need for medication adjustment of medical intervention for : Neurological problems;Pulmonary problems  Ryaan Vanwagoner 04/12/2018, 2:36 PM

## 2018-04-12 NOTE — Progress Notes (Signed)
Orthopedic Tech Progress Note Patient Details:  Hannah Jefferson [birth year] [id number]  Ortho Devices Type of Ortho Device: Thumb spica splint Splint Material: Plaster Ortho Device/Splint Location: lue Ortho Device/Splint Interventions: Application   Post Interventions Patient Tolerated: Well Instructions Provided: Care of device As ordered by PA Haynes Dage, Samuella Rasool 04/12/2018, 8:42 AM

## 2018-04-12 NOTE — Progress Notes (Signed)
Occupational Therapy Session Note  Patient Details  Name: Hannah Jefferson MRN: [id number] Date of Birth: 1956-06-16  Today's Date: 04/12/2018 OT Individual Time: 0830-0900 OT Individual Time Calculation (min): 30 min  and Today's Date: 04/12/2018 OT Missed Time: 30 Minutes Missed Time Reason: Other (comment)(ortho tech removing cast)   Short Term Goals: Week 3:  OT Short Term Goal 1 (Week 3): Pt will be able to sit to stand with max A of 1 to prepare for LB self care. OT Short Term Goal 2 (Week 3): Pt will be able to squat pivot to w/c with max A of 1.  OT Short Term Goal 3 (Week 3): Pt will use AE to don pants over feet with mod A.  Skilled Therapeutic Interventions/Progress Updates:    Pt resting in bed upon arrival.  Ortho tech recently removed cast and applied hard splint.  OT intervention with focus on LB dressing tasks at bed level secondary to time constraints, bed mobility, sitting balance, and sit<>stand with Stedy for transfer to w/c.  Pt required min A for supine>sit EOB and maintained sitting balance at supervision level.  Pt performed sit<>stand with Stedy requiring min A.  Pt remained in w/c with sister-in-law present to complete UB bathing/dressing tasks.    Therapy Documentation Precautions:  Precautions Precautions: Fall, Back Precaution Comments: LUE NWB in splint Restrictions Weight Bearing Restrictions: Yes LUE Weight Bearing: Non weight bearing General: General OT Amount of Missed Time: 30 Minutes Pain:  Pt denies pain this morning  See Function Navigator for Current Functional Status.   Therapy/Group: Individual Therapy  [name] 04/12/2018, 12:08 PM

## 2018-04-13 ENCOUNTER — Inpatient Hospital Stay (HOSPITAL_COMMUNITY): Payer: 59

## 2018-04-13 ENCOUNTER — Inpatient Hospital Stay (HOSPITAL_COMMUNITY): Payer: 59 | Admitting: Physical Therapy

## 2018-04-13 DIAGNOSIS — A499 Bacterial infection, unspecified: Secondary | ICD-10-CM

## 2018-04-13 DIAGNOSIS — N390 Urinary tract infection, site not specified: Secondary | ICD-10-CM

## 2018-04-13 LAB — BASIC METABOLIC PANEL
Anion gap: 7 (ref 5–15)
BUN: <5 mg/dL — ABNORMAL LOW (ref 6–20)
CO2: 28 mmol/L (ref 22–32)
Calcium: 9.1 mg/dL (ref 8.9–10.3)
Chloride: 97 mmol/L — ABNORMAL LOW (ref 101–111)
Creatinine, Ser: 0.49 mg/dL (ref 0.44–1.00)
GFR calc Af Amer: >60 mL/min (ref >60)
GFR calc non Af Amer: >60 mL/min (ref >60)
Glucose, Bld: 112 mg/dL — ABNORMAL HIGH (ref 65–99)
Potassium: 4 mmol/L (ref 3.5–5.1)
Sodium: 132 mmol/L — ABNORMAL LOW (ref 135–145)

## 2018-04-13 LAB — URINALYSIS, COMPLETE (UACMP) WITH MICROSCOPIC
Bacteria, UA: NONE SEEN
Bilirubin Urine: NEGATIVE
Glucose, UA: NEGATIVE mg/dL
Hgb urine dipstick: NEGATIVE
Ketones, ur: NEGATIVE mg/dL
Nitrite: NEGATIVE
Protein, ur: NEGATIVE mg/dL
Specific Gravity, Urine: 1.011 (ref 1.005–1.030)
WBC, UA: >50 WBC/hpf — ABNORMAL HIGH (ref 0–5)
pH: 5 (ref 5.0–8.0)

## 2018-04-13 LAB — MAGNESIUM: Magnesium: 1.5 mg/dL — ABNORMAL LOW (ref 1.7–2.4)

## 2018-04-13 MED ORDER — MAGNESIUM OXIDE 400 (241.3 MG) MG PO TABS
400.0000 mg | ORAL_TABLET | Freq: Three times a day (TID) | ORAL | Status: DC
  Administered 2018-04-13 – 2018-04-21 (×26): 400 mg via ORAL
  Filled 2018-04-13 (×28): qty 1

## 2018-04-13 MED ORDER — CEPHALEXIN 250 MG PO CAPS
250.0000 mg | ORAL_CAPSULE | Freq: Three times a day (TID) | ORAL | Status: DC
  Administered 2018-04-13 – 2018-04-15 (×6): 250 mg via ORAL
  Filled 2018-04-13 (×7): qty 1

## 2018-04-13 NOTE — Progress Notes (Signed)
[facility] PHYSICAL MEDICINE & REHABILITATION     PROGRESS NOTE    Subjective/Complaints: Continue to progress. Unable to eat a whole lot. Family pushing protein shakes. Denies pain. Moving bowels and having flatus  ROS: Patient denies fever, rash, sore throat, blurred vision, nausea, vomiting, diarrhea, cough, shortness of breath or chest pain, joint or back pain, headache, or mood change.    Objective: Vital Signs: Blood pressure 118/76, pulse 98, temperature 97.9 F (36.6 C), temperature source Oral, resp. rate 18, height 5\' 4"  (1.626 m), weight 102.1 kg (225 lb 1.4 oz), SpO2 99 %. Dg Wrist 2 Views Left  Result Date: 04/12/2018 CLINICAL DATA:  Distal radial fracture. EXAM: LEFT WRIST - 2 VIEW COMPARISON:  None. FINDINGS: Comminuted distal radial metaphysis fracture extending to the articular surface transfixed with a volar side plate and multiple interlocking screws without significant callus formation. Displaced ununited distal ulnar styloid process fracture. No other fracture or dislocation. IMPRESSION: 1. Comminuted distal radial metaphysis fracture extending to the articular surface transfixed with a volar side plate and multiple interlocking screws without significant callus formation. 2. Displaced ununited distal ulnar styloid process fracture. Electronically Signed   By: Kathreen Devoid   On: 04/12/2018 17:14   No results for input(s): WBC, HGB, HCT, PLT in the last 72 hours. Recent Labs    04/12/18 0429 04/13/18 0436  NA 133* 132*  K 4.5 4.0  CL 97* 97*  GLUCOSE 126* 112*  BUN <5* <5*  CREATININE 0.47 0.49  CALCIUM 9.1 9.1   CBG (last 3)  No results for input(s): GLUCAP in the last 72 hours.  Wt Readings from Last 3 Encounters:  04/12/18 102.1 kg (225 lb 1.4 oz)  03/20/18 93.4 kg (206 lb)  02/15/18 94.1 kg (207 lb 8 oz)    Physical Exam:  Constitutional: No distress . Vital signs reviewed. HEENT: EOMI, oral membranes moist Neck: supple Cardiovascular:  RRR without murmur. No JVD    Respiratory: CTA Bilaterally without wheezes or rales. Normal effort    GI: BS +, non-tender, non-distended  Neurological: Motor RUE 5/5. LUE intact where testable.  Decreased sensation to LT and proprioception, left greater than right brelow T8 dermatome. LE motor 2+ to 3/5 HF, KE and 3/5---stable  ADF/PF. DTR's 1+.    Skin:    PICC LUE intact Psychiatric:  pleasant  Assessment/Plan: 1. Paraplegia and functional deficits secondary to T8 myelopathy, left wrist fracture which require 3+ hours per day of interdisciplinary therapy in a comprehensive inpatient rehab setting. Physiatrist is providing close team supervision and 24 hour management of active medical problems listed below. Physiatrist and rehab team continue to assess barriers to discharge/monitor patient progress toward functional and medical goals.  Function:  Bathing Bathing position Bathing activity did not occur: N/A Position: Wheelchair/chair at sink  Bathing parts Body parts bathed by patient: Right arm, Left arm, Chest, Abdomen, Front perineal area, Right upper leg, Left upper leg Body parts bathed by helper: Buttocks, Right lower leg, Left lower leg  Bathing assist Assist Level: Assistive device, 2 helpers Assistive Device Comment: reacher    Upper Body Dressing/Undressing Upper body dressing Upper body dressing/undressing activity did not occur: N/A What is the patient wearing?: Pull over shirt/dress, Bra Bra - Perfomed by patient: Thread/unthread right bra strap, Thread/unthread left bra strap Bra - Perfomed by helper: Hook/unhook bra (pull down sports bra) Pull over shirt/dress - Perfomed by patient: Thread/unthread right sleeve, Thread/unthread left sleeve, Put head through opening,  Pull shirt over trunk Pull over shirt/dress - Perfomed by helper: Put head through opening Button up shirt - Perfomed by patient: Thread/unthread right sleeve, Thread/unthread left sleeve, Button/unbutton  shirt Button up shirt - Perfomed by helper: Pull shirt around back    Upper body assist Assist Level: Touching or steadying assistance(Pt > 75%)      Lower Body Dressing/Undressing Lower body dressing Lower body dressing/undressing activity did not occur: N/A What is the patient wearing?: Pants, Socks, Shoes     Pants- Performed by patient: Thread/unthread left pants leg, Thread/unthread right pants leg Pants- Performed by helper: Pull pants up/down   Non-skid slipper socks- Performed by helper: Don/doff right sock, Don/doff left sock   Socks - Performed by helper: Don/doff right sock, Don/doff left sock   Shoes - Performed by helper: Don/doff right shoe, Don/doff left shoe, Fasten right, Fasten left   AFO - Performed by helper: Don/doff left AFO      Lower body assist Assist for lower body dressing: 2 Helpers      Toileting Toileting Toileting activity did not occur: Safety/medical concerns Toileting steps completed by patient: Performs perineal hygiene Toileting steps completed by helper: Adjust clothing prior to toileting, Adjust clothing after toileting Toileting Assistive Devices: Other (comment)(stedy)  Toileting assist Assist level: Two helpers   Transfers Chair/bed transfer   Chair/bed transfer method: Other Chair/bed transfer assist level: dependent (Pt equals 0%) Chair/bed transfer assistive device: Mechanical lift Mechanical lift: Stedy   Locomotion Ambulation Ambulation activity did not occur: Safety/medical concerns         Wheelchair   Type: Manual Max wheelchair distance: 150 Assist Level: Touching or steadying assistance (Pt > 75%)  Cognition Comprehension Comprehension assist level: Follows complex conversation/direction with no assist  Expression Expression assist level: Expresses complex ideas: With no assist  Social Interaction Social Interaction assist level: Interacts appropriately with others - No medications needed.  Problem Solving  Problem solving assist level: Solves complex problems: Recognizes & self-corrects  Memory Memory assist level: More than reasonable amount of time   Medical Problem List and Plan: 1.Paraplegia and funtional deficitssecondary to T8 meningioma/resection with myeopathy -recent left wrist fracture, now in left wrist splint -Continue CIR PT OT 2. 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      3. Pain Management:Hydrocodone prn 4. Mood:LCSW to follow for evaluation and support. 5. Neuropsych: This patientiscapable of making decisions on herown behalf. 6. Skin/Wound Care:Routine pressure relief measures. 7. Fluids/Electrolytes/Nutrition:Monitor I/O.    -potassium up to 4.0 today, continue 59meq bid  -mg 1.5 today  -increase mg supplement  400mg  TID  --continue daily labs  -- DIII thins 8. Post op ileus/neurogenic bowel:    -moving bowels, exam remains benign  - continue IV reglan  -encourage OOB to tolerance  -replace Electrolytes as above 9.Neurogenic bladder: foley out bladder incontinence  10. GERD:managed withPPIbid. 11. Hyponatremia:  Resolved   -hctz stopped  -off IVF    12.Leftwrist fracture: NWB. [place] 13. Dyspepsia  -protonix  -prn maalox 14. Malodorous urine:  -ua + for leuks and wbc's---begin empiric keflex   LOS (Days) 23 A FACE TO FACE EVALUATION WAS PERFORMED  Meredith Staggers, MD 04/13/2018 1:36 PM

## 2018-04-13 NOTE — Progress Notes (Signed)
Physical Therapy Session Note  Patient Details  Name: Hannah Jefferson MRN: [id number] Date of Birth: 1956-09-07  Today's Date: 04/13/2018 PT Individual Time: 0240-9735 PT Individual Time Calculation (min): 80 min   Short Term Goals: Week 3:  PT Short Term Goal 1 (Week 3): Pt will transfer in/out of w/c with +1 assist in 50% of observations.  PT Short Term Goal 2 (Week 3): Pt will initiate standard w/c propulsion for increased UE strength and overall activity tolerance.  PT Short Term Goal 3 (Week 3): Pt will recall and demonstrate lateral leans in w/c to assist with pressure relief.  PT Short Term Goal 4 (Week 3): Pt will tolerate standing with BUE support x1 minute.   Skilled Therapeutic Interventions/Progress Updates:    no c/o pain but reports fatigue.  Session focus on functional transfers with stedy and slide board, w/c mobility and education on pressure relief with power w/c, and NMR via ambulation in // bars with maxi-sky.    Shoes/aircast donned total assist for time management.  Pt transfers to EOB with min assist for trunk, and mod assist into stedy for transfer to power w/c.  Pt educated on w/c positioning and use of power w/c controls to improve sitting tolerance and provide autonomy for pressure relief.  Pt propelled w/c to therapy gym.  Slide board to transfer to and from therapy mat with mod assist and cues for head/hips relationship, forward weight shift, and use of LEs for pushing.  Sit<>stand x2 with // bars and maxi-sky for safety, mod assist to rise.  Initiated gait training with pt able to take 3 steps forward/backwards and 5 steps forward/backwards.  On second trial, pt utilized sling to sit/rest before ambulating backwards to mat.  Pt requires manual facilitation for weight shifting, and to block R knee for safety.  Also occasionally requiring assist for bilateral foot placement.  Pt returned to room in power w/c at end of session, states feeling "clammy."  BP 101/45 and HR  116, but reports feeling better reclined in w/c.  Left with call bell in reach and needs met.   Therapy Documentation Precautions:  Precautions Precautions: Fall, Back Precaution Comments: LUE NWB in splint Restrictions Weight Bearing Restrictions: Yes LUE Weight Bearing: Weight bearing as tolerated RLE Weight Bearing: Weight bearing as tolerated   See Function Navigator for Current Functional Status.   Therapy/Group: Individual Therapy  Michel Santee 04/13/2018, 4:46 PM

## 2018-04-13 NOTE — Progress Notes (Signed)
Occupational Therapy Session Note  Patient Details  Name: BRITTANNIE TAWNEY MRN: [id number] Date of Birth: April 23, 1956  Today's Date: 04/13/2018 OT Individual Time: 0800-0900 OT Individual Time Calculation (min): 60 min    Short Term Goals: Week 4:  OT Short Term Goal 1 (Week 4): Pt will be able to squat pivot to w/c with max A of 1.  OT Short Term Goal 2 (Week 4): Pt will pull pants over hips with max A for standing in Stedy OT Short Term Goal 3 (Week 4): Pt will don socks using AE with min A OT Short Term Goal 4 (Week 4): Pt will don shoes using AE PRN with min A  Skilled Therapeutic Interventions/Progress Updates:    OT intervention with focus on bathing/dressing with sit<>stand from [facility] and w/c.  Pt performed supine>sit EOB with steady A to move RLE off EOB.  Pt performed sit<>stand from bed, BSC, and w/c with min A in [place].  Pt completed bathing tasks seated on BSC and transferred to w/c to don pants.  Pt able to thread BLE with reacher and initiated pulling up pants but required assistance to complete.  Pt completed grooming tasks seated in w/c at sink. Pt remained in w/c with sister-in-law present.   Therapy Documentation Precautions:  Precautions Precautions: Fall, Back Precaution Comments: LUE NWB in splint Restrictions Weight Bearing Restrictions: Yes LUE Weight Bearing: Non weight bearing RLE Weight Bearing: Weight bearing as tolerated    Pain:  Pt denies pain  See Function Navigator for Current Functional Status.   Therapy/Group: Individual Therapy  [name] 04/13/2018, 9:05 AM

## 2018-04-13 NOTE — Progress Notes (Signed)
Occupational Therapy Weekly Progress Note  Patient Details  Name: Hannah Jefferson MRN: [id number] Date of Birth: 02/07/1956  Beginning of progress report period: Apr 06, 2018 End of progress report period: Apr 13, 2018   Patient has met 2 of 3 short term goals.  Pt is making steady progress with bed mobility, sitting balance, sit<>stand with Stedy, and activity tolerance.  Pt completes UB dressing tasks seated EOB with min A.  Pt uses reacher to thread pants over BLE but requires tot A for pulling over hips when standing with Stedy.  Pt currently uses Stedy for transfers.  Pt requires min A for sit<>stand with Stedy. Pt's LUE cast has been removed and is now WBAT.   Patient continues to demonstrate the following deficits: muscle weakness, decreased cardiorespiratoy endurance, unbalanced muscle activation and decreased standing balance and therefore will continue to benefit from skilled OT intervention to enhance overall performance with BADL.  Patient progressing toward long term goals..  Continue plan of care.  OT Short Term Goals Week 3:  OT Short Term Goal 1 (Week 3): Pt will be able to sit to stand with max A of 1 to prepare for LB self care. OT Short Term Goal 1 - Progress (Week 3): Met OT Short Term Goal 2 (Week 3): Pt will be able to squat pivot to w/c with max A of 1.  OT Short Term Goal 2 - Progress (Week 3): Progressing toward goal OT Short Term Goal 3 (Week 3): Pt will use AE to don pants over feet with mod A. OT Short Term Goal 3 - Progress (Week 3): Met Week 4:  OT Short Term Goal 1 (Week 4): Pt will be able to squat pivot to w/c with max A of 1.  OT Short Term Goal 2 (Week 4): Pt will pull pants over hips with max A for standing in Stedy OT Short Term Goal 3 (Week 4): Pt will don socks using AE with min A OT Short Term Goal 4 (Week 4): Pt will don shoes using AE PRN with min A     Therapy Documentation Precautions:  Precautions Precautions: Fall, Back Precaution  Comments: LUE NWB in splint Restrictions Weight Bearing Restrictions: Yes LUE Weight Bearing: Non weight bearing RLE Weight Bearing: Weight bearing as tolerated  See Function Navigator for Current Functional Status.     Lanier, Thomas Chappell 04/13/2018, 6:51 AM   

## 2018-04-13 NOTE — Progress Notes (Signed)
Physical Therapy Session Note  Patient Details  Name: Hannah Jefferson MRN: [id number] Date of Birth: [birth year]  Today's Date: 04/13/2018 PT Individual Time: 1015-1100 PT Individual Time Calculation (min): 45 min   Short Term Goals: Week 3:  PT Short Term Goal 1 (Week 3): Pt will transfer in/out of w/c with +1 assist in 50% of observations.  PT Short Term Goal 2 (Week 3): Pt will initiate standard w/c propulsion for increased UE strength and overall activity tolerance.  PT Short Term Goal 3 (Week 3): Pt will recall and demonstrate lateral leans in w/c to assist with pressure relief.  PT Short Term Goal 4 (Week 3): Pt will tolerate standing with BUE support x1 minute.   Skilled Therapeutic Interventions/Progress Updates:    Pt received seated in TIS w/c, reports some pain in low back and coccyx from sitting up. Pt was off the floor with family and had misread schedule as to what time she had therapy scheduled. Pt missed 15 min of skilled therapy treatment time. Seated BLE therex x 10 reps with 2# ankle weights and orange theraband. Sit to stand mod A to stedy from w/c. Stedy transfer w/c to bed. Sit to stand SBA from stedy seat. Sit to supine total A for trunk control and LE assist. Supine BLE therex with AAROM, focus on eccentric control. Pt left supine in bed with needs in reach, sister in law present.  Therapy Documentation Precautions:  Precautions Precautions: Fall, Back Precaution Comments: LUE NWB in splint Restrictions Weight Bearing Restrictions: Yes LUE Weight Bearing: Weight bearing as tolerated RLE Weight Bearing: Weight bearing as tolerated General: PT Amount of Missed Time (min): 15 Minutes PT Missed Treatment Reason: Unavailable (Comment)(pt off floor with family)  See Function Navigator for Current Functional Status.   Therapy/Group: Individual Therapy  Excell Seltzer, PT, DPT  04/13/2018, 3:12 PM

## 2018-04-14 ENCOUNTER — Inpatient Hospital Stay (HOSPITAL_COMMUNITY): Payer: 59

## 2018-04-14 ENCOUNTER — Inpatient Hospital Stay (HOSPITAL_COMMUNITY): Payer: 59 | Admitting: Occupational Therapy

## 2018-04-14 ENCOUNTER — Inpatient Hospital Stay (HOSPITAL_COMMUNITY): Payer: 59 | Admitting: Physical Therapy

## 2018-04-14 LAB — BASIC METABOLIC PANEL
Anion gap: 6 (ref 5–15)
BUN: 5 mg/dL — ABNORMAL LOW (ref 6–20)
CO2: 27 mmol/L (ref 22–32)
Calcium: 8.9 mg/dL (ref 8.9–10.3)
Chloride: 95 mmol/L — ABNORMAL LOW (ref 101–111)
Creatinine, Ser: 0.5 mg/dL (ref 0.44–1.00)
GFR calc Af Amer: >60 mL/min (ref >60)
GFR calc non Af Amer: >60 mL/min (ref >60)
Glucose, Bld: 115 mg/dL — ABNORMAL HIGH (ref 65–99)
Potassium: 4.2 mmol/L (ref 3.5–5.1)
Sodium: 128 mmol/L — ABNORMAL LOW (ref 135–145)

## 2018-04-14 LAB — MAGNESIUM: Magnesium: 1.5 mg/dL — ABNORMAL LOW (ref 1.7–2.4)

## 2018-04-14 MED ORDER — GENERIC EXTERNAL MEDICATION
Status: DC
Start: ? — End: 2018-04-14

## 2018-04-14 MED ORDER — METOCLOPRAMIDE HCL 5 MG/ML IJ SOLN
10.0000 mg | Freq: Four times a day (QID) | INTRAMUSCULAR | Status: DC
  Administered 2018-04-14 – 2018-04-19 (×19): 10 mg via INTRAVENOUS
  Filled 2018-04-14 (×19): qty 2

## 2018-04-14 MED ORDER — HYDROCORTISONE ACETATE 25 MG RE SUPP
25.0000 mg | Freq: Two times a day (BID) | RECTAL | Status: DC
  Administered 2018-04-14 – 2018-04-20 (×14): 25 mg via RECTAL
  Filled 2018-04-14 (×14): qty 1

## 2018-04-14 MED ORDER — HYDROCORTISONE 2.5 % RE CREA
TOPICAL_CREAM | Freq: Three times a day (TID) | RECTAL | Status: DC
  Administered 2018-04-14 – 2018-04-18 (×11): via RECTAL
  Administered 2018-04-18: 1 via RECTAL
  Administered 2018-04-19 – 2018-04-20 (×4): via RECTAL
  Filled 2018-04-14: qty 28.35

## 2018-04-14 NOTE — Progress Notes (Signed)
Physical Therapy Note  Patient Details  Name: Hannah Jefferson MRN: [id number] Date of Birth: 09-11-1956 Today's Date: 04/14/2018    Pt c/o abdominal pain and fatigue 2/2 nausea medication.  Declining therapy at this time.  Will f/u as schedule allows.    Michel Santee 04/14/2018, 3:39 PM

## 2018-04-14 NOTE — Progress Notes (Signed)
Occupational Therapy Session Note  Patient Details  Name: Hannah Jefferson MRN: [id number] Date of Birth: 06/07/1956  Today's Date: 04/14/2018 OT Individual Time: 2258-3462 OT Individual Time Calculation (min): 28 min    Short Term Goals: Week 3:  OT Short Term Goal 1 (Week 3): Pt will be able to sit to stand with max A of 1 to prepare for LB self care. OT Short Term Goal 1 - Progress (Week 3): Met OT Short Term Goal 2 (Week 3): Pt will be able to squat pivot to w/c with max A of 1.  OT Short Term Goal 2 - Progress (Week 3): Progressing toward goal OT Short Term Goal 3 (Week 3): Pt will use AE to don pants over feet with mod A. OT Short Term Goal 3 - Progress (Week 3): Met  Skilled Therapeutic Interventions/Progress Updates:    1:1. Pt with no c/o pain and requesting to use BSC. Pt completes supine to sitting in stedy with touching A to transfer onto [facility]. Pt completes sit to stand in stedy from [facility] height with supervision for clothing management and peri care. Pt completes transfer back to bed as stated above in stedy. PT completes lateral scooting in prep for [facility] transfer with min-MOD A and VC for head hips relationship. Exited session with pt  Seated in bed, call light in reach and all needs met  Therapy Documentation Precautions:  Precautions Precautions: Fall, Back Precaution Comments: LUE NWB in splint Restrictions Weight Bearing Restrictions: Yes LUE Weight Bearing: Weight bearing as tolerated RLE Weight Bearing: Weight bearing as tolerated General:  See Function Navigator for Current Functional Status.   Therapy/Group: Individual Therapy  [name] 04/14/2018, 2:26 PM

## 2018-04-14 NOTE — Progress Notes (Signed)
[facility] [place] PHYSICAL MEDICINE & REHABILITATION     PROGRESS NOTE    Subjective/Complaints: Feels sick this morning after having a bm. +hemorrhoids. Vomited after I left room. Walked yesterday in therapy  ROS: Patient denies fever, rash, sore throat, blurred vision,  cough, shortness of breath or chest pain, joint or back pain, headache, or mood change.   Objective: Vital Signs: Blood pressure (!) 91/53, pulse 100, temperature 98 F (36.7 C), temperature source Oral, resp. rate 18, height 5\' 4"  (1.626 m), weight 102.1 kg (225 lb 1.4 oz), SpO2 98 %. Dg Wrist 2 Views Left  Result Date: 04/12/2018 CLINICAL DATA:  Distal radial fracture. EXAM: LEFT WRIST - 2 VIEW COMPARISON:  None. FINDINGS: Comminuted distal radial metaphysis fracture extending to the articular surface transfixed with a volar side plate and multiple interlocking screws without significant callus formation. Displaced ununited distal ulnar styloid process fracture. No other fracture or dislocation. IMPRESSION: 1. Comminuted distal radial metaphysis fracture extending to the articular surface transfixed with a volar side plate and multiple interlocking screws without significant callus formation. 2. Displaced ununited distal ulnar styloid process fracture. Electronically Signed   By: Kathreen Devoid   On: 04/12/2018 17:14   No results for input(s): WBC, HGB, HCT, PLT in the last 72 hours. Recent Labs    04/13/18 0436 04/14/18 0508  NA 132* 128*  K 4.0 4.2  CL 97* 95*  GLUCOSE 112* 115*  BUN <5* 5*  CREATININE 0.49 0.50  CALCIUM 9.1 8.9   CBG (last 3)  No results for input(s): GLUCAP in the last 72 hours.  Wt Readings from Last 3 Encounters:  04/12/18 102.1 kg (225 lb 1.4 oz)  03/20/18 93.4 kg (206 lb)  02/15/18 94.1 kg (207 lb 8 oz)    Physical Exam:  Constitutional: No distress . Vital signs reviewed. HEENT: EOMI, oral membranes moist Neck: supple Cardiovascular: RRR without murmur. No JVD    Respiratory:  CTA Bilaterally without wheezes or rales. Normal effort    GI: minimal bowel sounds. Sl more distended. NT Neurological: Motor RUE 5/5. LUE 5/5 less wrist.  Decreased sensation to LT and proprioception, left greater than right brelow T8 dermatome. LE motor 2+ to 3/5 HF, KE and 3/5---stable ADF/PF. DTR's 1+.    Skin:    PICC LUE intact Psychiatric:  pleasant  Assessment/Plan: 1. Paraplegia and functional deficits secondary to T8 myelopathy, left wrist fracture which require 3+ hours per day of interdisciplinary therapy in a comprehensive inpatient rehab setting. Physiatrist is providing close team supervision and 24 hour management of active medical problems listed below. Physiatrist and rehab team continue to assess barriers to discharge/monitor patient progress toward functional and medical goals.  Function:  Bathing Bathing position Bathing activity did not occur: N/A Position: Wheelchair/chair at sink  Bathing parts Body parts bathed by patient: Right arm, Left arm, Chest, Abdomen, Front perineal area, Right upper leg, Left upper leg Body parts bathed by helper: Buttocks, Right lower leg, Left lower leg  Bathing assist Assist Level: Assistive device, 2 helpers Assistive Device Comment: reacher    Upper Body Dressing/Undressing Upper body dressing Upper body dressing/undressing activity did not occur: N/A What is the patient wearing?: Pull over shirt/dress, Bra Bra - Perfomed by patient: Thread/unthread right bra strap, Thread/unthread left bra strap Bra - Perfomed by helper: Hook/unhook bra (pull down sports bra) Pull over shirt/dress - Perfomed by patient: Thread/unthread right sleeve, Thread/unthread left sleeve, Put head through opening, Pull shirt over  trunk Pull over shirt/dress - Perfomed by helper: Put head through opening Button up shirt - Perfomed by patient: Thread/unthread right sleeve, Thread/unthread left sleeve, Button/unbutton shirt Button up shirt - Perfomed by  helper: Pull shirt around back    Upper body assist Assist Level: Touching or steadying assistance(Pt > 75%)      Lower Body Dressing/Undressing Lower body dressing Lower body dressing/undressing activity did not occur: N/A What is the patient wearing?: Pants, Socks, Shoes     Pants- Performed by patient: Thread/unthread left pants leg, Thread/unthread right pants leg Pants- Performed by helper: Pull pants up/down   Non-skid slipper socks- Performed by helper: Don/doff right sock, Don/doff left sock   Socks - Performed by helper: Don/doff right sock, Don/doff left sock   Shoes - Performed by helper: Don/doff right shoe, Don/doff left shoe, Fasten right, Fasten left   AFO - Performed by helper: Don/doff left AFO      Lower body assist Assist for lower body dressing: 2 Helpers      Toileting Toileting Toileting activity did not occur: Safety/medical concerns Toileting steps completed by patient: Performs perineal hygiene Toileting steps completed by helper: Adjust clothing prior to toileting, Adjust clothing after toileting Toileting Assistive Devices: Other (comment)(stedy)  Toileting assist Assist level: Two helpers   Transfers Chair/bed transfer   Chair/bed transfer method: Lateral scoot Chair/bed transfer assist level: Moderate assist (Pt 50 - 74%/lift or lower) Chair/bed transfer assistive device: Sliding board Mechanical lift: Stedy   Locomotion Ambulation Ambulation activity did not occur: Safety/medical concerns   Max distance: 3 Assist level: 2 helpers   Wheelchair   Type: Manual Max wheelchair distance: 150 Assist Level: Touching or steadying assistance (Pt > 75%)  Cognition Comprehension Comprehension assist level: Follows complex conversation/direction with no assist  Expression Expression assist level: Expresses complex ideas: With no assist  Social Interaction Social Interaction assist level: Interacts appropriately with others - No medications needed.   Problem Solving Problem solving assist level: Solves complex problems: Recognizes & self-corrects  Memory Memory assist level: More than reasonable amount of time   Medical Problem List and Plan: 1.Paraplegia and funtional deficitssecondary to T8 meningioma/resection with myeopathy -recent left wrist fracture, now in left wrist splint -Continue CIR PT OT 2. 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      3. Pain Management:Hydrocodone prn 4. Mood:LCSW to follow for evaluation and support. 5. Neuropsych: This patientiscapable of making decisions on herown behalf. 6. Skin/Wound Care:Routine pressure relief measures. 7. Fluids/Electrolytes/Nutrition:Monitor I/O.    -potassium 4.2 today, 5/31. continue 52meq bid  -mg remains  1.5 today  -increased mg supplement  400mg  TID  -will give a run of IV Mg today  --continue daily labs  --clear liquid 8. Post op ileus/neurogenic bowel:    -nauseas, vomited this morning. abd more distended  -change back to IV reglan  -encourage OOB to tolerance  -replace Electrolytes as above  -change diet back to clears  -KUB ordered 9.Neurogenic bladder: foley out bladder incontinence  10. GERD:managed withPPIbid. 11. Hyponatremia:  Resolved   -hctz stopped  -off IVF    12.Leftwrist fracture: NWB. [place] 13. Dyspepsia  -protonix  -prn maalox 14. Malodorous urine:  -ua + for leuks and wbc's, ucx with 100k E Coli  -continue empiric keflex   LOS (Days) 24 A FACE TO FACE EVALUATION WAS PERFORMED  Meredith Staggers, MD 04/14/2018 8:35  AM

## 2018-04-14 NOTE — Progress Notes (Signed)
KUB demonstrates no signs of ileus. Nausea resolved after morning episode. Felt fatigued after receiving medication for nausea however. Continue clear liquid diet and IV reglan. Await urine culture sensitivity. May need to consider keflex as source of nausea if it persists. Continue daily labs.   Meredith Staggers, MD, [facility] 04/14/2018

## 2018-04-14 NOTE — Progress Notes (Signed)
Occupational Therapy Session Note  Patient Details  Name: Hannah Jefferson MRN: [id number] Date of Birth: October 10, 1956  Today's Date: 04/14/2018 OT Individual Time: 0700-0755 OT Individual Time Calculation (min): 55 min    Short Term Goals: Week 1:  OT Short Term Goal 1 (Week 1): Pt will be able to sit to stand with max A of 1 to prepare for LB self care. OT Short Term Goal 1 - Progress (Week 1): Progressing toward goal OT Short Term Goal 2 (Week 1): Pt will be able to squat pivot to w/c with max A of 1.  OT Short Term Goal 2 - Progress (Week 1): Progressing toward goal OT Short Term Goal 3 (Week 1): Pt will don shirt with min A. OT Short Term Goal 3 - Progress (Week 1): Met OT Short Term Goal 4 (Week 1): Pt will use AE to don pants over feet with mod A. OT Short Term Goal 4 - Progress (Week 1): Progressing toward goal  Skilled Therapeutic Interventions/Progress Updates:    1:1. Upon entering room, pt c/o abdominal/rectum pain/pressure. Pt received suppository and has not gone to bathroom yet. RN aware. OT uses stedy to transfer pt to [facility] with min A for sit to stand in stedy. Pt able to have large soft bowel movement in BSC with increased time and dig stim from RN. Pt sit to stand in stedy 3x for hygiene and donning new brief. Pt completes bed level exercises 1 min per exercise for BLE strengthening in prep for functional transfers: SAQ, hip ab/aduc, heel slides, SLR. Exited session with family and RN in room and all needs in reach  Therapy Documentation Precautions:  Precautions Precautions: Fall, Back Precaution Comments: LUE NWB in splint Restrictions Weight Bearing Restrictions: Yes LUE Weight Bearing: Weight bearing as tolerated RLE Weight Bearing: Weight bearing as tolerated General:   Vital Signs: Therapy Vitals Temp: 98 F (36.7 C) Temp Source: Oral Pulse Rate: 100 Resp: 18 BP: 105/69 Patient Position (if appropriate): Lying Oxygen Therapy SpO2: 98 % O2 Device: Room  Air Pain:    See Function Navigator for Current Functional Status.   Therapy/Group: Individual Therapy  [name] 04/14/2018, 7:58 AM

## 2018-04-14 NOTE — Progress Notes (Signed)
Occupational Therapy Session Note  Patient Details  Name: Hannah Jefferson MRN: [id number] Date of Birth: [birth year]  Today's Date: 04/14/2018 OT Individual Time: 1100-1130 OT Individual Time Calculation (min): 30 min   Short Term Goals: Week 4:  OT Short Term Goal 1 (Week 4): Pt will be able to squat pivot to w/c with max A of 1.  OT Short Term Goal 2 (Week 4): Pt will pull pants over hips with max A for standing in Stedy OT Short Term Goal 3 (Week 4): Pt will don socks using AE with min A OT Short Term Goal 4 (Week 4): Pt will don shoes using AE PRN with min A  Skilled Therapeutic Interventions/Progress Updates:    Pt greeted asleep in bed with sister-in-law present, easy to wake and agreeable to OT. Pt came to sitting EOB with increased time and min A. Worked on LB dressing seated EOB using reacher with set-up and min A to thread pants and verbal cues for technique on where to grab fabric. Sit<>stand with min A using Stedy while therapist assist with pulling pants up over hips. Pt then transferred to wc in [place]. Pt completed toothbrushing task seated in wc at the sink with set-up A and was left tilted in TIS wc with family present and needs met.   Therapy Documentation Precautions:  Precautions Precautions: Fall, Back Precaution Comments: LUE NWB in splint Restrictions Weight Bearing Restrictions: Yes LUE Weight Bearing: Weight bearing as tolerated RLE Weight Bearing: Weight bearing as tolerated Pain: Pain Assessment Pain Score: 0-No pain ADL: ADL ADL Comments: refer to functional navigator  See Function Navigator for Current Functional Status.   Therapy/Group: Individual Therapy  Valma Cava 04/14/2018, 11:37 AM

## 2018-04-15 ENCOUNTER — Inpatient Hospital Stay (HOSPITAL_COMMUNITY): Payer: 59 | Admitting: Occupational Therapy

## 2018-04-15 ENCOUNTER — Inpatient Hospital Stay (HOSPITAL_COMMUNITY): Payer: 59 | Admitting: Physical Therapy

## 2018-04-15 DIAGNOSIS — N390 Urinary tract infection, site not specified: Secondary | ICD-10-CM

## 2018-04-15 DIAGNOSIS — I82411 Acute embolism and thrombosis of right femoral vein: Secondary | ICD-10-CM

## 2018-04-15 DIAGNOSIS — E871 Hypo-osmolality and hyponatremia: Secondary | ICD-10-CM

## 2018-04-15 DIAGNOSIS — E876 Hypokalemia: Secondary | ICD-10-CM

## 2018-04-15 LAB — BASIC METABOLIC PANEL
Anion gap: 8 (ref 5–15)
BUN: <5 mg/dL — ABNORMAL LOW (ref 6–20)
CO2: 27 mmol/L (ref 22–32)
Calcium: 9.3 mg/dL (ref 8.9–10.3)
Chloride: 96 mmol/L — ABNORMAL LOW (ref 101–111)
Creatinine, Ser: 0.51 mg/dL (ref 0.44–1.00)
GFR calc Af Amer: >60 mL/min (ref >60)
GFR calc non Af Amer: >60 mL/min (ref >60)
Glucose, Bld: 98 mg/dL (ref 65–99)
Potassium: 4 mmol/L (ref 3.5–5.1)
Sodium: 131 mmol/L — ABNORMAL LOW (ref 135–145)

## 2018-04-15 LAB — URINE CULTURE: Culture: >=100000 — AB

## 2018-04-15 LAB — MAGNESIUM: Magnesium: 1.6 mg/dL — ABNORMAL LOW (ref 1.7–2.4)

## 2018-04-15 MED ORDER — FOSFOMYCIN TROMETHAMINE 3 G PO PACK
3.0000 g | PACK | ORAL | Status: AC
  Administered 2018-04-15 – 2018-04-18 (×2): 3 g via ORAL
  Filled 2018-04-15 (×2): qty 3

## 2018-04-15 NOTE — Progress Notes (Signed)
[place] PHYSICAL MEDICINE & REHABILITATION     PROGRESS NOTE    Subjective/Complaints: Pt seen lying in bed this AM.  She states she slept well overnight.  She has questions about her diet.  She notes she had a BM.  Informed by nursing regarding nausea with keflex and potassium supplement.  Family at bedside.   ROS: Denies CP, SOB, N/V/D  Objective: Vital Signs: Blood pressure 117/83, pulse (!) 102, temperature 98.1 F (36.7 C), resp. rate 20, height 5\' 4"  (1.626 m), weight 102.1 kg (225 lb 1.4 oz), SpO2 96 %. Dg Abd 1 View  Result Date: 04/14/2018 CLINICAL DATA:  Ileus EXAM: ABDOMEN - 1 VIEW COMPARISON:  04/06/2018 FINDINGS: IVC filter remains in place. Unchanged. Prior cholecystectomy. Decreasing gaseous distention of the colon. Gas noted throughout nondistended large and small bowel. No evidence of obstruction or free air. No organomegaly. IMPRESSION: No current evidence for obstruction or ileus. Prior cholecystectomy. Electronically Signed   By: Rolm Baptise M.D.   On: 04/14/2018 09:01   No results for input(s): WBC, HGB, HCT, PLT in the last 72 hours. Recent Labs    04/14/18 0508 04/15/18 0500  NA 128* 131*  K 4.2 4.0  CL 95* 96*  GLUCOSE 115* 98  BUN 5* <5*  CREATININE 0.50 0.51  CALCIUM 8.9 9.3   CBG (last 3)  No results for input(s): GLUCAP in the last 72 hours.  Wt Readings from Last 3 Encounters:  04/12/18 102.1 kg (225 lb 1.4 oz)  03/20/18 93.4 kg (206 lb)  02/15/18 94.1 kg (207 lb 8 oz)    Physical Exam:  Constitutional: No distress . Vital signs reviewed. HENT: Normocephalic.  Atraumatic. Eyes: EOMI. No discharge. Cardiovascular: +Tachycardia. Regular rhythm. No JVD. Respiratory: CTA Bilaterally. Normal effort. GI: slow bowel sounds. Non tender Neurological: Motor B/l UE 5/5.  B/l LE 3+-4-/5 HF, KE and 3/5 ADF.  Skin:Warm and dry. Intact.  Psychiatric:  pleasant  Assessment/Plan: 1. Paraplegia and functional deficits secondary to T8 myelopathy,  left wrist fracture which require 3+ hours per day of interdisciplinary therapy in a comprehensive inpatient rehab setting. Physiatrist is providing close team supervision and 24 hour management of active medical problems listed below. Physiatrist and rehab team continue to assess barriers to discharge/monitor patient progress toward functional and medical goals.  Function:  Bathing Bathing position Bathing activity did not occur: N/A Position: Wheelchair/chair at sink  Bathing parts Body parts bathed by patient: Right arm, Left arm, Chest, Abdomen, Front perineal area, Right upper leg, Left upper leg Body parts bathed by helper: Buttocks, Right lower leg, Left lower leg  Bathing assist Assist Level: Assistive device, 2 helpers Assistive Device Comment: reacher    Upper Body Dressing/Undressing Upper body dressing Upper body dressing/undressing activity did not occur: N/A What is the patient wearing?: Pull over shirt/dress, Bra Bra - Perfomed by patient: Thread/unthread right bra strap, Thread/unthread left bra strap Bra - Perfomed by helper: Hook/unhook bra (pull down sports bra) Pull over shirt/dress - Perfomed by patient: Thread/unthread right sleeve, Thread/unthread left sleeve, Put head through opening, Pull shirt over trunk Pull over shirt/dress - Perfomed by helper: Put head through opening Button up shirt - Perfomed by patient: Thread/unthread right sleeve, Thread/unthread left sleeve, Button/unbutton shirt Button up shirt - Perfomed by helper: Pull shirt around back    Upper body assist Assist Level: Touching or steadying assistance(Pt > 75%)      Lower Body Dressing/Undressing Lower body dressing Lower body dressing/undressing activity did not occur:  N/A What is the patient wearing?: Pants, Socks, Shoes     Pants- Performed by patient: Thread/unthread left pants leg, Thread/unthread right pants leg Pants- Performed by helper: Pull pants up/down   Non-skid slipper socks-  Performed by helper: Don/doff right sock, Don/doff left sock   Socks - Performed by helper: Don/doff right sock, Don/doff left sock   Shoes - Performed by helper: Don/doff right shoe, Don/doff left shoe, Fasten right, Fasten left   AFO - Performed by helper: Don/doff left AFO      Lower body assist Assist for lower body dressing: 2 Helpers      Toileting Toileting Toileting activity did not occur: Safety/medical concerns Toileting steps completed by patient: Performs perineal hygiene Toileting steps completed by helper: Adjust clothing prior to toileting, Adjust clothing after toileting Toileting Assistive Devices: Other (comment)(stedy)  Toileting assist Assist level: Two helpers   Transfers Chair/bed transfer   Chair/bed transfer method: Lateral scoot Chair/bed transfer assist level: Moderate assist (Pt 50 - 74%/lift or lower) Chair/bed transfer assistive device: Sliding board Mechanical lift: Stedy   Locomotion Ambulation Ambulation activity did not occur: Safety/medical concerns   Max distance: 3 Assist level: 2 helpers   Wheelchair   Type: Manual Max wheelchair distance: 150 Assist Level: Touching or steadying assistance (Pt > 75%)  Cognition Comprehension Comprehension assist level: Follows complex conversation/direction with no assist  Expression Expression assist level: Expresses complex ideas: With no assist  Social Interaction Social Interaction assist level: Interacts appropriately with others - No medications needed.  Problem Solving Problem solving assist level: Solves complex problems: Recognizes & self-corrects  Memory Memory assist level: More than reasonable amount of time   Medical Problem List and Plan: 1.Paraplegia and funtional deficitssecondary to T8 meningioma/resection with myeopathy -recent left wrist fracture, now in left wrist splint -Continue CIR  Notes reviewed, labs reviewd 2. 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  3. Pain Management:Hydrocodone prn 4. Mood:LCSW to follow for evaluation and support. 5. Neuropsych: This patientiscapable of making decisions on herown behalf. 6. Skin/Wound Care:Routine pressure relief measures. 7. Fluids/Electrolytes/Nutrition:Monitor I/O.    -potassium 4.0 on 6/1, continue 92meq bid  -mg 1.6 on 6/1, cont mg supplement  400mg  TID  --continue daily labs  --clear liquids, advanced to soft diet on 6/1 8. Post op ileus/neurogenic bowel:    -nausea related to meds.    -change back to IV reglan  -encourage OOB to tolerance  -replace Electrolytes as above  -change diet back to soft diet  -KUB reviewed, unremarkable 9.Neurogenic bladder: foley out bladder incontinence  10. GERD:managed withPPIbid. 11. Hyponatremia:  Reslved   -hctz stopped  Na+ 131 on 6/1  Cont to monitor 12.Leftwrist fracture: NWB. [place] 13. Dyspepsia  -protonix  -prn maalox 14. Acute lower UTI:  -E Coli  -Keflex changed to fosfomycin x 2 doses   LOS (Days) 25 A FACE TO FACE EVALUATION WAS PERFORMED  Kevon Tench Lorie Phenix, MD 04/15/2018 9:38 AM

## 2018-04-15 NOTE — Progress Notes (Signed)
Physical Therapy Session Note  Patient Details  Name: Hannah Jefferson MRN: [id number] Date of Birth: 1956-05-04  Today's Date: 04/15/2018 PT Individual Time: 1105-1120 and 1300-1400 PT Individual Time Calculation (min): 15 min and 60 min (total 75 min)   Short Term Goals: Week 3:  PT Short Term Goal 1 (Week 3): Pt will transfer in/out of w/c with +1 assist in 50% of observations.  PT Short Term Goal 2 (Week 3): Pt will initiate standard w/c propulsion for increased UE strength and overall activity tolerance.  PT Short Term Goal 3 (Week 3): Pt will recall and demonstrate lateral leans in w/c to assist with pressure relief.  PT Short Term Goal 4 (Week 3): Pt will tolerate standing with BUE support x1 minute.   Skilled Therapeutic Interventions/Progress Updates: Tx 1: Pt seen to assist staff with transport to wheelchair dance group; pt in bed with soiled brief and no pants on. Rolling R/L with S and bedrails while therapist performed hygiene and donning brief/pants totalA. Supine>sit with logroll and minA. Shoes and L aircast donned totalA. Sit >stand in stedy minA from elevated bed. Transferred to w/c totalA in stedy. Sit>stand from stedy seat with S; standing tolerance of approximately 1 min before requesting to sit. Remained in w/c with handoff to OT at end of session.   Tx 2: Pt received seated in w/c, denies pain and agreeable to treatment. Sit >stand from tilt-in-space w/c with RW, LUE on platform and RUE pushing from w/c armrest. Standing pre-gait steps in place with min guard. Note extensor tone limiting safety with stand>sit and required min/modA to slow/control descent. Sit >stand in stedy minA; cues for "unlocking" knees and forward trunk flexion prior to sitting to improve eccentric control and safety with stand>sit. Transferred onto toilet with stedy minA. Pt able to doff pants in standing, alternating UEs, requires assist for hygiene and pulling up pants/brief. Gait with L platform RW and  modA with w/c follow x8'; scissoring gait pattern and difficulty progressing weight over next stance LE. Utilized LLE PLS AFO to neutralize ankle position medial/lateral while also providing toe clearance to decrease degrees of freedom. Reports dizziness at end of trial; BP assessed 124/74 and dizziness resolves in <2 min. Gait trial x15' with R rail and mod/maxA from therapist under LUE, improved step width but increased difficulty maintaining COM over feet. Third gait trial x10' with platform RW and min/modA, fatigues quicker than earlier trials however improved upright posture with repetitive verbal/tactile cues for looking forward instead of at feet. Attempted stand pivot to bed however difficulty coordination rotational steps to pivot to bed; transferred with stedy minA. MinA sit >supine. Remained in bed, all needs in reach at completion of session.      Therapy Documentation Precautions:  Precautions Precautions: Fall, Back Precaution Comments: LUE NWB in splint Restrictions Weight Bearing Restrictions: (P) Yes LUE Weight Bearing: (P) Weight bear through elbow only RLE Weight Bearing: (P) Weight bearing as tolerated  See Function Navigator for Current Functional Status.   Therapy/Group: Individual Therapy  Luberta Mutter 04/15/2018, 11:49 AM

## 2018-04-15 NOTE — Progress Notes (Signed)
Patient unable to tolerate some of her medication without vomitus episode , states they are making her feel sick. Ice chips provided per patient request with spirit drink, able to repeat antibiotic and tolerate dosage without nausea or vomiting, monitored. Continue IVF-  KVO.Visitor at bedside overnight, denies discomfort

## 2018-04-15 NOTE — Plan of Care (Signed)
  Problem: RH SKIN INTEGRITY Goal: RH STG SKIN FREE OF INFECTION/BREAKDOWN Description min   Outcome: Progressing

## 2018-04-15 NOTE — Progress Notes (Signed)
Patient advanced to a Soft diet per Dr, Posey Pronto,,,,,,

## 2018-04-15 NOTE — Progress Notes (Signed)
Pt wt 149lbs, removed equipment off bed and pillows. Pt had no experience of nausea. Overall, pt feels her day was 8/10 good(great).

## 2018-04-15 NOTE — Progress Notes (Signed)
Occupational Therapy Session Note  Patient Details  Name: Hannah Jefferson MRN: [id number] Date of Birth: 1956/11/12  Today's Date: 04/15/2018 OT Group Time: 1130-1200 OT Group Time Calculation (min): 30 min   Skilled Therapeutic Interventions/Progress Updates:    Pt participated in therapeutic w/c level dance group focusing on activity tolerance, UE/LE strengthening, pressure relief and social participation. Pt engaged with other participants, smiling, requesting songs. She was guided throughout dance exercises including straight arm raises, hand claps, leg lifts, and seated marches. Also incorporated lateral leans and forward weight shifts for pressure relief/core strengthening. Pt took multiple seated rest breaks, often upgraded task demands by incorporating 3 limbs simultaneously. At end of session she was escorted back to room with RT.   30 minutes missed due to pt requiring extra time and staff assist to get ready for group.   Therapy Documentation Precautions:  Precautions Precautions: Fall, Back Precaution Comments: LUE NWB in splint Restrictions Weight Bearing Restrictions: (P) Yes LUE Weight Bearing: (P) Weight bear through elbow only RLE Weight Bearing: (P) Weight bearing as tolerated Vital Signs: Therapy Vitals Temp: 98.1 F (36.7 C) Pulse Rate: (!) 102 Resp: 20 BP: 117/83 Patient Position (if appropriate): Lying Oxygen Therapy SpO2: 96 % O2 Device: Room Air ADL: ADL ADL Comments: refer to functional navigator     See Function Navigator for Current Functional Status.   Therapy/Group: Group Therapy  Bashir Marchetti A Cianna Kasparian 04/15/2018, 12:43 PM

## 2018-04-16 ENCOUNTER — Inpatient Hospital Stay (HOSPITAL_COMMUNITY): Payer: 59

## 2018-04-16 DIAGNOSIS — D62 Acute posthemorrhagic anemia: Secondary | ICD-10-CM

## 2018-04-16 DIAGNOSIS — K567 Ileus, unspecified: Secondary | ICD-10-CM

## 2018-04-16 NOTE — Progress Notes (Signed)
PHYSICAL MEDICINE & REHABILITATION     PROGRESS NOTE    Subjective/Complaints: Patient seen lying in bed this morning. She states she slept well overnight. She notes that she had a bowel movement yesterday.  ROS: denies CP, SOB, N/V/D  Objective: Vital Signs: Blood pressure 95/69, pulse 99, temperature 98.2 F (36.8 C), temperature source Oral, resp. rate 16, height 5\' 4"  (1.626 m), weight 67.6 kg (149 lb), SpO2 97 %. Dg Abd 1 View  Result Date: 04/14/2018 CLINICAL DATA:  Ileus EXAM: ABDOMEN - 1 VIEW COMPARISON:  04/06/2018 FINDINGS: IVC filter remains in place. Unchanged. Prior cholecystectomy. Decreasing gaseous distention of the colon. Gas noted throughout nondistended large and small bowel. No evidence of obstruction or free air. No organomegaly. IMPRESSION: No current evidence for obstruction or ileus. Prior cholecystectomy. Electronically Signed   By: Rolm Baptise M.D.   On: 04/14/2018 09:01   No results for input(s): WBC, HGB, HCT, PLT in the last 72 hours. Recent Labs    04/14/18 0508 04/15/18 0500  NA 128* 131*  K 4.2 4.0  CL 95* 96*  GLUCOSE 115* 98  BUN 5* <5*  CREATININE 0.50 0.51  CALCIUM 8.9 9.3   CBG (last 3)  No results for input(s): GLUCAP in the last 72 hours.  Wt Readings from Last 3 Encounters:  04/15/18 67.6 kg (149 lb)  03/20/18 93.4 kg (206 lb)  02/15/18 94.1 kg (207 lb 8 oz)    Physical Exam:  Constitutional: No distress . Vital signs reviewed. HENT: Normocephalic.  Atraumatic. Eyes: EOMI. No discharge. Cardiovascular: +Tachycardia. Regular rhythm. No JVD. Respiratory: CTA Bilaterally. Normal effort. GI: slow bowel sounds. Non tender Neurological: Motor B/l UE 5/5.  B/l LE 3+-4-/5 HF, KE and 3+/5 ADF.  Skin:Warm and dry. Intact.  Psychiatric:  pleasant  Assessment/Plan: 1. Paraplegia and functional deficits secondary to T8 myelopathy, left wrist fracture which require 3+ hours per day of interdisciplinary therapy in a  comprehensive inpatient rehab setting. Physiatrist is providing close team supervision and 24 hour management of active medical problems listed below. Physiatrist and rehab team continue to assess barriers to discharge/monitor patient progress toward functional and medical goals.  Function:  Bathing Bathing position Bathing activity did not occur: N/A Position: Wheelchair/chair at sink  Bathing parts Body parts bathed by patient: Right arm, Left arm, Chest, Abdomen, Front perineal area, Right upper leg, Left upper leg Body parts bathed by helper: Buttocks, Right lower leg, Left lower leg  Bathing assist Assist Level: Assistive device, 2 helpers Assistive Device Comment: reacher    Upper Body Dressing/Undressing Upper body dressing Upper body dressing/undressing activity did not occur: N/A What is the patient wearing?: Pull over shirt/dress, Bra Bra - Perfomed by patient: Thread/unthread right bra strap, Thread/unthread left bra strap Bra - Perfomed by helper: Hook/unhook bra (pull down sports bra) Pull over shirt/dress - Perfomed by patient: Thread/unthread right sleeve, Thread/unthread left sleeve, Put head through opening, Pull shirt over trunk Pull over shirt/dress - Perfomed by helper: Put head through opening Button up shirt - Perfomed by patient: Thread/unthread right sleeve, Thread/unthread left sleeve, Button/unbutton shirt Button up shirt - Perfomed by helper: Pull shirt around back    Upper body assist Assist Level: Touching or steadying assistance(Pt > 75%)      Lower Body Dressing/Undressing Lower body dressing Lower body dressing/undressing activity did not occur: N/A What is the patient wearing?: Pants, Socks, Shoes     Pants- Performed by patient: Thread/unthread left pants leg, Thread/unthread right pants  leg Pants- Performed by helper: Pull pants up/down   Non-skid slipper socks- Performed by helper: Don/doff right sock, Don/doff left sock   Socks - Performed by  helper: Don/doff right sock, Don/doff left sock   Shoes - Performed by helper: Don/doff right shoe, Don/doff left shoe, Fasten right, Fasten left   AFO - Performed by helper: Don/doff left AFO      Lower body assist Assist for lower body dressing: 2 Helpers      Toileting Toileting Toileting activity did not occur: Safety/medical concerns Toileting steps completed by patient: Performs perineal hygiene Toileting steps completed by helper: Adjust clothing prior to toileting, Adjust clothing after toileting Toileting Assistive Devices: Other (comment)(stedy)  Toileting assist Assist level: Two helpers   Transfers Chair/bed transfer   Chair/bed transfer method: Lateral scoot Chair/bed transfer assist level: Moderate assist (Pt 50 - 74%/lift or lower) Chair/bed transfer assistive device: Sliding board Mechanical lift: Stedy   Locomotion Ambulation Ambulation activity did not occur: Safety/medical concerns   Max distance: 15 Assist level: 2 helpers   Wheelchair   Type: Manual Max wheelchair distance: 150 Assist Level: Touching or steadying assistance (Pt > 75%)  Cognition Comprehension Comprehension assist level: Follows complex conversation/direction with no assist  Expression Expression assist level: Expresses complex ideas: With no assist  Social Interaction Social Interaction assist level: Interacts appropriately with others with medication or extra time (anti-anxiety, antidepressant).  Problem Solving Problem solving assist level: Solves complex problems: With extra time  Memory Memory assist level: More than reasonable amount of time   Medical Problem List and Plan: 1.Paraplegia and funtional deficitssecondary to T8 meningioma/resection with myeopathy -recent left wrist fracture, now in left wrist splint -Continue CIR 2. 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  3. Pain Management:Hydrocodone prn 4. Mood:LCSW to follow for evaluation and support. 5. Neuropsych: This patientiscapable of making decisions on herown behalf. 6. Skin/Wound Care:Routine pressure relief measures. 7. Fluids/Electrolytes/Nutrition:Monitor I/O.    -potassium 4.0 on 6/1, continue 86meq bid  -mg 1.6 on 6/1, cont mg supplement  400mg  TID  --continue daily labs  --clear liquids, advanced to soft diet on 6/1 8. Post op ileus/neurogenic bowel:    -nausea related to meds.    -change back to IV reglan  -encourage OOB to tolerance  -replace Electrolytes as above  -change diet back to soft diet, appears to be tolerating  -KUB reviewed, unremarkable 9.Neurogenic bladder: foley out bladder incontinence  10. GERD:managed withPPIbid. 11. Hyponatremia:  Reslved   -hctz stopped  Na+ 131 on 6/1  Cont to monitor 12.Leftwrist fracture: NWB. [place] 13. Dyspepsia  -protonix  -prn maalox 14. Acute lower UTI:  -E Coli  -Keflex changed to fosfomycin x 2 doses 15. Acute blood loss anemia  Hemoglobin 11.4 on 5/27  Labs ordered for tomorrow   LOS (Days) 26 A FACE TO FACE EVALUATION WAS PERFORMED  Shed Nixon Lorie Phenix, MD 04/16/2018 7:02 AM

## 2018-04-16 NOTE — Progress Notes (Signed)
Physical Therapy Session Note  Patient Details  Name: Hannah Jefferson MRN: [id number] Date of Birth: 07-09-1956  Today's Date: 04/16/2018 PT Individual Time: 8250-0370 PT Individual Time Calculation (min): 59 min   Short Term Goals: Week 3:  PT Short Term Goal 1 (Week 3): Pt will transfer in/out of w/c with +1 assist in 50% of observations.  PT Short Term Goal 2 (Week 3): Pt will initiate standard w/c propulsion for increased UE strength and overall activity tolerance.  PT Short Term Goal 3 (Week 3): Pt will recall and demonstrate lateral leans in w/c to assist with pressure relief.  PT Short Term Goal 4 (Week 3): Pt will tolerate standing with BUE support x1 minute.   Skilled Therapeutic Interventions/Progress Updates:    Pt supine in bed upon PT arrival, agreeable to therapy tx and denies pain. Pt performed rolling in each direction with min assist while therapist donned pants. Pt transferred from supine>sitting EOB with mod assist. Therapist donned shoes and L AFO. Pt worked on seated balance while donning shirt with supervision. Pt performed sit<>stand from bed within stedy with min assist, transferred to w/c. Pt transported to the gym. Pt worked on pre-gait tasks and standing balance this session. Pt performed x 5 sit<>stands throughout session with min to mod assist using platform RW. In standing pt performed pre-gait stepping forward/back in place with each LE 2 x 5 using mirror for visual feedback for foot placement. In standing pt worked on standing balance while tossing bean bags into bucket, x 2 trials. Pt reports just feeling very tired today and states she would like to hold off on ambulation today, therapist providing emotional support. Pt transported back to room and left seated with needs in reach.   Therapy Documentation Precautions:  Precautions Precautions: Fall, Back Precaution Comments: LUE NWB in splint Restrictions Weight Bearing Restrictions: Yes LUE Weight Bearing:  Weight bear through elbow only RLE Weight Bearing: Weight bearing as tolerated   See Function Navigator for Current Functional Status.   Therapy/Group: Individual Therapy  Netta Corrigan, PT, DPT 04/16/2018, 7:55 AM

## 2018-04-16 NOTE — Plan of Care (Signed)
  Problem: Consults Goal: RH SPINAL CORD INJURY PATIENT EDUCATION Description  See Patient Education module for education specifics.  Outcome: Progressing   Problem: SCI BOWEL ELIMINATION Goal: RH STG MANAGE BOWEL WITH ASSISTANCE Description STG Manage Bowel with mod Assistance.   Outcome: Progressing Goal: RH STG SCI MANAGE BOWEL PROGRAM W/ASSIST OR AS APPROPRIATE Description STG SCI Manage bowel program w/mod assist or as appropriate.   Outcome: Progressing   Problem: SCI BLADDER ELIMINATION Goal: RH STG MANAGE BLADDER WITH ASSISTANCE Description STG Manage Bladder With mod Assistance   Outcome: Progressing Goal: RH STG MANAGE BLADDER WITH EQUIPMENT WITH ASSISTANCE Description STG Manage Bladder With Equipment With mod Assistance   Outcome: Progressing   Problem: RH SKIN INTEGRITY Goal: RH STG SKIN FREE OF INFECTION/BREAKDOWN Description min   Outcome: Progressing   Problem: RH SAFETY Goal: RH STG ADHERE TO SAFETY PRECAUTIONS W/ASSISTANCE/DEVICE Description STG Adhere to Safety Precautions With min Assistance/Device.  Outcome: Progressing   Problem: RH PAIN MANAGEMENT Goal: RH STG PAIN MANAGED AT OR BELOW PT'S PAIN GOAL Description 2 or less  Outcome: Progressing

## 2018-04-17 ENCOUNTER — Inpatient Hospital Stay (HOSPITAL_COMMUNITY): Payer: 59

## 2018-04-17 LAB — CBC
HCT: 32.6 % — ABNORMAL LOW (ref 36.0–46.0)
Hemoglobin: 10.9 g/dL — ABNORMAL LOW (ref 12.0–15.0)
MCH: 29.5 pg (ref 26.0–34.0)
MCHC: 33.4 g/dL (ref 30.0–36.0)
MCV: 88.1 fL (ref 78.0–100.0)
Platelets: 217 10*3/uL (ref 150–400)
RBC: 3.7 MIL/uL — ABNORMAL LOW (ref 3.87–5.11)
RDW: 15.4 % (ref 11.5–15.5)
WBC: 5 10*3/uL (ref 4.0–10.5)

## 2018-04-17 MED ORDER — FLEET ENEMA 7-19 GM/118ML RE ENEM
1.0000 | ENEMA | Freq: Every day | RECTAL | Status: DC | PRN
  Filled 2018-04-17: qty 1

## 2018-04-17 MED ORDER — ALTEPLASE 2 MG IJ SOLR
2.0000 mg | Freq: Once | INTRAMUSCULAR | Status: DC
  Filled 2018-04-17: qty 2

## 2018-04-17 MED ORDER — METOCLOPRAMIDE HCL 5 MG PO TABS
10.0000 mg | ORAL_TABLET | Freq: Once | ORAL | Status: AC
  Administered 2018-04-17: 10 mg via ORAL
  Filled 2018-04-17: qty 2

## 2018-04-17 NOTE — Progress Notes (Signed)
Occupational Therapy Session Note  Patient Details  Name: Hannah Jefferson MRN: [id number] Date of Birth: 04-30-1956  Today's Date: 04/17/2018 OT Individual Time: 0800-0900 OT Individual Time Calculation (min): 60 min    Short Term Goals: Week 4:  OT Short Term Goal 1 (Week 4): Pt will be able to squat pivot to w/c with max A of 1.  OT Short Term Goal 2 (Week 4): Pt will pull pants over hips with max A for standing in Stedy OT Short Term Goal 3 (Week 4): Pt will don socks using AE with min A OT Short Term Goal 4 (Week 4): Pt will don shoes using AE PRN with min A  Skilled Therapeutic Interventions/Progress Updates:    OT intervention with focus on bathing/dressing with sit<>stand, sit<>stand, standing balance in [place], activity tolerance, and safety awareness to increase independence with BADLs.  Pt required min A for supine>sit EOB and min A for sit<>stand with Stedy for transfer to [facility].  Pt completed UB bathing/dressing tasks seated on BSC before transfer to w/c to complete LB dressing tasks.  Pt used reacher appropriately to thread BLE into pants.  Pt required assistance to pull up pants while standing in [place].  Pt completed grooming tasks seated in w/c at sink. Pt remained in w/c with sister-in-law present and all needs within reach.   Therapy Documentation Precautions:  Precautions Precautions: Fall, Back Precaution Comments: LUE NWB in splint Restrictions Weight Bearing Restrictions: Yes LUE Weight Bearing: Weight bear through elbow only RLE Weight Bearing: Weight bearing as tolerated Pain: Pain Assessment Pain Scale: 0-10 Pain Score: 0-No pain Patients Stated Pain Goal: 2 See Function Navigator for Current Functional Status.   Therapy/Group: Individual Therapy  [name] 04/17/2018, 12:05 PM

## 2018-04-17 NOTE — Progress Notes (Signed)
Occupational Therapy Note  Patient Details  Name: Hannah Jefferson MRN: [id number] Date of Birth: [birth year]  Today's Date: 04/17/2018 OT Individual Time: 2800-3491 OT Individual Time Calculation (min): 39 min   Pt denies pain Individual therapy  Pt resting in be upon arrival and agreeable to therapy.  Pt required min A to move BLE off EOB and min A for supine>sit EOB in preparation for transfers to w/c with Stedy.  Pt required min A for sit<>stand with Stedy.  Pt engaged in grooming tasks seated in w/c, including washing hair with rinse-free shampoo and drying with hair dryer.  Pt required more than a reasonable amount of time to complete tasks and stated she was exhausted upon completion.  Pt remained in w/c with sister-in-law present.  Focus on activity tolerance, bed mobility, sit<>stand, and BUE strengthening during grooming tasks to increase independence with BADLs.    [name] 04/17/2018, 2:37 PM

## 2018-04-17 NOTE — Progress Notes (Signed)
Physical Therapy Session Note  Patient Details  Name: Hannah Jefferson MRN: [id number] Date of Birth: 06/11/1956  Today's Date: 04/17/2018 PT Individual Time: 1045-1200 PT Individual Time Calculation (min): 75 min   Short Term Goals: Week 3:  PT Short Term Goal 1 (Week 3): Pt will transfer in/out of w/c with +1 assist in 50% of observations.  PT Short Term Goal 2 (Week 3): Pt will initiate standard w/c propulsion for increased UE strength and overall activity tolerance.  PT Short Term Goal 3 (Week 3): Pt will recall and demonstrate lateral leans in w/c to assist with pressure relief.  PT Short Term Goal 4 (Week 3): Pt will tolerate standing with BUE support x1 minute.   Skilled Therapeutic Interventions/Progress Updates:    no c/o pain but does report fatigue.  Rest breaks provided throughout the session with discussion regarding progress towards goals and d/c planning.  Session focus on activity tolerance, ambulation, stand/pivot transfers, and stretching for tone management and to reduce risk of contractures.    Gait training x20 with platform RW, +2 for safety and w/c follow, with overall mod assist for gait, and mod verbal cues for upright posture, increased base of support, and activation of quads/glutes.  Gait training x25' with mod assist +1 for gait and w/c follow again for safety.  Pt with continued need for cues for base of support and activation of LE musculature but with good carryover for upright posture.  Stand/pivot transfer x2 (both to pt's R) with platform RW and mod assist +1 with extended supine rest break between.  During rest break PT provided PROM stretching to bilat heel cords and hamstrings (prox and distal).  Supine<>sit with mod assist for LEs to supine and trunk to sit.  Positioned in ultra lightweight w/c at end of session, and pt able to propel back to room with BUEs and slightly increased time.  Positioned upright and encouraged to try and eat some lunch.  Call bell in  reach and needs met.   Therapy Documentation Precautions:  Precautions Precautions: Fall, Back Precaution Comments: LUE NWB in splint Restrictions Weight Bearing Restrictions: Yes LUE Weight Bearing: Weight bear through elbow only RLE Weight Bearing: Weight bearing as tolerated   See Function Navigator for Current Functional Status.   Therapy/Group: Individual Therapy   E  04/17/2018, 7:09 PM  

## 2018-04-17 NOTE — Progress Notes (Signed)
Physical Therapy Session Note  Patient Details  Name: Hannah Jefferson MRN: [id number] Date of Birth: Feb 12, 1956  Today's Date: 04/17/2018 PT Individual Time: 1300-1330 PT Individual Time Calculation (min): 30 min   Short Term Goals: Week 3:  PT Short Term Goal 1 (Week 3): Pt will transfer in/out of w/c with +1 assist in 50% of observations.  PT Short Term Goal 2 (Week 3): Pt will initiate standard w/c propulsion for increased UE strength and overall activity tolerance.  PT Short Term Goal 3 (Week 3): Pt will recall and demonstrate lateral leans in w/c to assist with pressure relief.  PT Short Term Goal 4 (Week 3): Pt will tolerate standing with BUE support x1 minute.   Skilled Therapeutic Interventions/Progress Updates:    Pt c/o significant fatigue from this AM sessions and request to work bed level to conserve energy for next therapy session this afternoon. Functional bed mobility to come to EOB to work on balance and LE therex with min assist and use of bedrails with cues for technique. IV team arrived and needed to flush IV and request to lay pt back down, mod assist to return to supine. Pt then reports feeling like she had a BM, noted incontinent BM in brief and agreeable to attempt toilet transfer. Min assist again to come to EOB with cues for technique and assist with positioning of LE's. Mod assist for sit <> stand with facilitation for anterior weightshift and cues for hip/trunk extension using Stedy to transfer to toilet and able to complete sit <> stand from perched position for hygiene and clothing management with min assist - maintaining standing balance with supervision to min assist as fatigued. Pt continent void but no further BM. Required max for sit -> stand from [facility] with Stedy and transferred back to bed with mod assist to return to supine with cues for technique.    Therapy Documentation Precautions:  Precautions Precautions: Fall, Back Precaution Comments: LUE NWB in  splint Restrictions Weight Bearing Restrictions: Yes LUE Weight Bearing: WBAT RLE Weight Bearing: Weight bearing as tolerated Pain: Pain Assessment Pain Scale: 0-10 Pain Score: 0-No pain Patients Stated Pain Goal: 2   See Function Navigator for Current Functional Status.   Therapy/Group: Individual Therapy  Canary Brim Ivory Broad, PT, DPT  04/17/2018, 1:45 PM

## 2018-04-17 NOTE — Progress Notes (Signed)
[place] PHYSICAL MEDICINE & REHABILITATION     PROGRESS NOTE    Subjective/Complaints: Did fairly well overnight. Hasn't really moved bowels since Saturday but just started back on solids. No belly pain, nausea  ROS: Patient denies fever, rash, sore throat, blurred vision, nausea, vomiting, diarrhea, cough, shortness of breath or chest pain, joint or back pain, headache, or mood change.   Objective: Vital Signs: Blood pressure 113/63, pulse (!) 107, temperature 98.4 F (36.9 C), temperature source Oral, resp. rate 17, height 5\' 4"  (1.626 m), weight 67.6 kg (149 lb), SpO2 99 %. No results found. Recent Labs    04/17/18 0418  WBC 5.0  HGB 10.9*  HCT 32.6*  PLT 217   Recent Labs    04/15/18 0500  NA 131*  K 4.0  CL 96*  GLUCOSE 98  BUN <5*  CREATININE 0.51  CALCIUM 9.3   CBG (last 3)  No results for input(s): GLUCAP in the last 72 hours.  Wt Readings from Last 3 Encounters:  04/15/18 67.6 kg (149 lb)  03/20/18 93.4 kg (206 lb)  02/15/18 94.1 kg (207 lb 8 oz)    Physical Exam:  Constitutional: No distress . Vital signs reviewed. HEENT: EOMI, oral membranes moist Neck: supple Cardiovascular: RRR without murmur. No JVD    Respiratory: CTA Bilaterally without wheezes or rales. Normal effort    GI: BS +, non-tender, non-distended  Neurological: Motor B/l UE 5/5.  B/l LE 3+-4-/5 HF, KE and 3+/5 ADF. --stable. Sensory decr 1/2 below level of injury to LT/proprioception Skin:Warm and dry. Intact.  Psychiatric:  pleasant  Assessment/Plan: 1. Paraplegia and functional deficits secondary to T8 myelopathy, left wrist fracture which require 3+ hours per day of interdisciplinary therapy in a comprehensive inpatient rehab setting. Physiatrist is providing close team supervision and 24 hour management of active medical problems listed below. Physiatrist and rehab team continue to assess barriers to discharge/monitor patient progress toward functional and medical  goals.  Function:  Bathing Bathing position Bathing activity did not occur: N/A Position: Wheelchair/chair at sink  Bathing parts Body parts bathed by patient: Right arm, Left arm, Chest, Abdomen, Front perineal area, Right upper leg, Left upper leg Body parts bathed by helper: Buttocks, Right lower leg, Left lower leg  Bathing assist Assist Level: Assistive device, 2 helpers Assistive Device Comment: reacher    Upper Body Dressing/Undressing Upper body dressing Upper body dressing/undressing activity did not occur: N/A What is the patient wearing?: Pull over shirt/dress, Bra Bra - Perfomed by patient: Thread/unthread right bra strap, Thread/unthread left bra strap Bra - Perfomed by helper: Hook/unhook bra (pull down sports bra) Pull over shirt/dress - Perfomed by patient: Thread/unthread right sleeve, Thread/unthread left sleeve, Put head through opening, Pull shirt over trunk Pull over shirt/dress - Perfomed by helper: Put head through opening Button up shirt - Perfomed by patient: Thread/unthread right sleeve, Thread/unthread left sleeve, Button/unbutton shirt Button up shirt - Perfomed by helper: Pull shirt around back    Upper body assist Assist Level: Touching or steadying assistance(Pt > 75%)      Lower Body Dressing/Undressing Lower body dressing Lower body dressing/undressing activity did not occur: N/A What is the patient wearing?: Pants, Socks, Shoes     Pants- Performed by patient: Thread/unthread left pants leg, Thread/unthread right pants leg Pants- Performed by helper: Pull pants up/down   Non-skid slipper socks- Performed by helper: Don/doff right sock, Don/doff left sock   Socks - Performed by helper: Don/doff right sock, Don/doff left sock  Shoes - Performed by helper: Don/doff right shoe, Don/doff left shoe, Fasten right, Fasten left   AFO - Performed by helper: Don/doff left AFO      Lower body assist Assist for lower body dressing: 2 Helpers       Toileting Toileting Toileting activity did not occur: Safety/medical concerns Toileting steps completed by patient: Performs perineal hygiene Toileting steps completed by helper: Adjust clothing prior to toileting, Performs perineal hygiene, Adjust clothing after toileting Toileting Assistive Devices: Other (comment)(diaper)  Toileting assist Assist level: Two helpers   Transfers Chair/bed transfer   Chair/bed transfer method: Lateral scoot Chair/bed transfer assist level: Moderate assist (Pt 50 - 74%/lift or lower) Chair/bed transfer assistive device: Mechanical lift Mechanical lift: Stedy   Locomotion Ambulation Ambulation activity did not occur: Safety/medical concerns   Max distance: 15 Assist level: 2 helpers   Wheelchair   Type: Manual Max wheelchair distance: 150 Assist Level: Touching or steadying assistance (Pt > 75%)  Cognition Comprehension Comprehension assist level: Follows complex conversation/direction with no assist  Expression Expression assist level: Expresses complex ideas: With no assist  Social Interaction Social Interaction assist level: Interacts appropriately with others with medication or extra time (anti-anxiety, antidepressant).  Problem Solving Problem solving assist level: Solves complex problems: With extra time  Memory Memory assist level: More than reasonable amount of time   Medical Problem List and Plan: 1.Paraplegia and funtional deficitssecondary to T8 meningioma/resection with myeopathy -recent left wrist fracture, now in left wrist splint -Continue CIR 2. 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  3. Pain Management:Hydrocodone prn 4. Mood:LCSW to follow for evaluation and support. 5. Neuropsych: This patientiscapable of making decisions on herown behalf. 6. Skin/Wound  Care:Routine pressure relief measures. 7. Fluids/Electrolytes/Nutrition:Monitor I/O.    -potassium 4.0 on 6/1, continue 73meq bid  -mg 1.6 on 6/1, cont mg supplement  400mg  TID  --continue daily labs, recheck mg tomorrow  --clear liquids, advanced to soft diet on 6/1--tolerating so far 8. Post op ileus/neurogenic bowel:    -nausea related to meds?    -continue IV reglan  -encourage OOB to tolerance  -replace Electrolytes as above  -changed diet back to soft diet, appears to be tolerating 9.Neurogenic bladder: foley out bladder incontinence  10. GERD:managed withPPIbid. 11. Hyponatremia:  Reslved   -hctz stopped  Na+ 131 on 6/1---recheck tomorrow  Cont to monitor 12.Leftwrist fracture: NWB. [place] 13. Dyspepsia  -protonix  -prn maalox 14. Acute lower UTI:  -E Coli  -Keflex changed to fosfomycin x 2 doses 15. Acute blood loss anemia  Hemoglobin 10.9 6/3      LOS (Days) 27 A FACE TO FACE EVALUATION WAS PERFORMED  Meredith Staggers, MD 04/17/2018 8:59 AM

## 2018-04-18 ENCOUNTER — Inpatient Hospital Stay (HOSPITAL_COMMUNITY): Payer: 59

## 2018-04-18 ENCOUNTER — Inpatient Hospital Stay (HOSPITAL_COMMUNITY): Payer: 59 | Admitting: Physical Therapy

## 2018-04-18 LAB — BASIC METABOLIC PANEL
Anion gap: 9 (ref 5–15)
BUN: 6 mg/dL (ref 6–20)
CO2: 27 mmol/L (ref 22–32)
Calcium: 8.9 mg/dL (ref 8.9–10.3)
Chloride: 95 mmol/L — ABNORMAL LOW (ref 101–111)
Creatinine, Ser: 0.57 mg/dL (ref 0.44–1.00)
GFR calc Af Amer: >60 mL/min (ref >60)
GFR calc non Af Amer: >60 mL/min (ref >60)
Glucose, Bld: 106 mg/dL — ABNORMAL HIGH (ref 65–99)
Potassium: 4.1 mmol/L (ref 3.5–5.1)
Sodium: 131 mmol/L — ABNORMAL LOW (ref 135–145)

## 2018-04-18 LAB — MAGNESIUM: Magnesium: 1.8 mg/dL (ref 1.7–2.4)

## 2018-04-18 NOTE — Progress Notes (Signed)
Patient resting comfortable throughout shift,respiration unlabored on room air, coloration unremarkable no complaint. IV PICC occlusion unsuccessful attempt bY IV team with TPA will notify Medical team for additional orders,

## 2018-04-18 NOTE — Progress Notes (Addendum)
Spoke with patient's nurse and she reported that MD wants PICC line to be pulled per order. Fran Lowes, RN VAST

## 2018-04-18 NOTE — Progress Notes (Signed)
Physical Therapy Weekly Progress Note  Patient Details  Name: Hannah Jefferson MRN: [id number] Date of Birth: [birth year]  Beginning of progress report period: Apr 10, 2018 End of progress report period: April 18, 2018  Today's Date: 04/18/2018 PT Individual Time: 1100-1200 and 1430-1530 PT Individual Time Calculation (min): 60 min and 60 min   Patient has met 3 of 4 short term goals.  Pt has made excellent progress this reporting period with her bed mobility, transfers, and been able to initiate gait.  She continues to fatigue very quickly and requires frequent rest breaks throughout sessions.  Patient continues to demonstrate the following deficits muscle weakness and muscle paralysis, decreased cardiorespiratoy endurance, impaired timing and sequencing, abnormal tone, unbalanced muscle activation, ataxia and decreased coordination and decreased standing balance, decreased postural control, decreased balance strategies and paraplegia and therefore will continue to benefit from skilled PT intervention to increase functional independence with mobility.  Patient progressing toward long term goals..  Plan of care revisions: d/c car transfer and stair goal.  PT Short Term Goals Week 3:  PT Short Term Goal 1 (Week 3): Pt will transfer in/out of w/c with +1 assist in 50% of observations.  PT Short Term Goal 1 - Progress (Week 3): Met PT Short Term Goal 2 (Week 3): Pt will initiate standard w/c propulsion for increased UE strength and overall activity tolerance.  PT Short Term Goal 2 - Progress (Week 3): Met PT Short Term Goal 3 (Week 3): Pt will recall and demonstrate lateral leans in w/c to assist with pressure relief.  PT Short Term Goal 3 - Progress (Week 3): Met PT Short Term Goal 4 (Week 3): Pt will tolerate standing with BUE support x1 minute.  PT Short Term Goal 4 - Progress (Week 3): Progressing toward goal Week 4:  PT Short Term Goal 1 (Week 4): =LTGs due to ELOS  Skilled Therapeutic  Interventions/Progress Updates:    Session 1: no c/o pain and reporting feeling less fatigued after rest break.  Session focus on NMR via standing and gait training, and w/c evaluation for seating/positioning system.  Pt transitions sit<>stand from w/c with mod assist to rise, cues for upright posture and foot positioning.  Gait training x15' as ambulatory approach to therapy mat with platform RW, mod assist for gait, +2 for w/c follow.  Pt requires assist for managing RW and occasional assist with LE buckling.  Cues provided throughout ambulation for upright posture, increasing BOS slightly (though improved from yesterday's session), and walker positioning.  Max cues during turning for safe approach/sit on therapy mat.  Pt able to sit on therapy mat with back support x15 minutes before feeling fatigued/ill and needing to lie down.  Transition sit>supine with mod assist for LEs, and min assist to return to sitting.  Stand/pivot back to w/c at end of session with RW and mod assist.  While on therapy mat, pt participated in seating/positioning evaluation with ATP/PT.  Returned to room at end of session and agreeable to sit up for lunch and return to bed with nursing staff.    Session 2: no c/o pain, reporting she was able to rest in between sessions but still felt some fatigue.  Session focus on w/c mobility with light weight w/c in controlled and community environments.  Supine>sit with min assist and max multimodal cues for LE positioning throughout.  Sit<>stand with RW and mod assist, and ultimately requiring +2 assist for stand/pivot to steady equipment, mod assist for pt and max multimodal  cues for pivot to the R.  Pt completed grooming tasks at sink with set up assist.  W/C mobility on and off unit (outside) focus on activity tolerance, UE strengthening, and mobility.  Pt able to navigate in controlled environment with supervision, requires min assist on carpet when exiting hospital.  She is able to negotiate  up/down an incline with min assist, and on/off elevators with min assist.  Returned to room at end of session, positioned back to bed with stedy for energy conservation, mod assist for LEs but improved trunk control this time.  Positioned in supine with call bell in reach and needs met.   Therapy Documentation Precautions:  Precautions Precautions: Fall, Back Precaution Comments: LUE NWB in splint Restrictions Weight Bearing Restrictions: Yes LUE Weight Bearing: Weight bear through elbow only(as tolerated) RLE Weight Bearing: Weight bearing as tolerated   See Function Navigator for Current Functional Status.  Therapy/Group: Individual Therapy  Michel Santee 04/18/2018, 4:42 PM

## 2018-04-18 NOTE — Progress Notes (Signed)
[place] PHYSICAL MEDICINE & REHABILITATION     PROGRESS NOTE    Subjective/Complaints: No problems overnight. Eating breakfast and tolerating. Expressed concerns about her dc date of 6/11  ROS: Patient denies fever, rash, sore throat, blurred vision, nausea, vomiting, diarrhea, cough, shortness of breath or chest pain, joint or back pain, headache, or mood change.    Objective: Vital Signs: Blood pressure 98/63, pulse (!) 101, temperature 98.4 F (36.9 C), temperature source Oral, resp. rate 17, height 5\' 4"  (1.626 m), weight 67.6 kg (149 lb), SpO2 96 %. No results found. Recent Labs    04/17/18 0418  WBC 5.0  HGB 10.9*  HCT 32.6*  PLT 217   Recent Labs    04/18/18 0511  NA 131*  K 4.1  CL 95*  GLUCOSE 106*  BUN 6  CREATININE 0.57  CALCIUM 8.9   CBG (last 3)  No results for input(s): GLUCAP in the last 72 hours.  Wt Readings from Last 3 Encounters:  04/15/18 67.6 kg (149 lb)  03/20/18 93.4 kg (206 lb)  02/15/18 94.1 kg (207 lb 8 oz)    Physical Exam:  Constitutional: No distress . Vital signs reviewed. HEENT: EOMI, oral membranes moist Neck: supple Cardiovascular: RRR without murmur. No JVD    Respiratory: CTA Bilaterally without wheezes or rales. Normal effort    GI: BS +, non-tender, non-distended  Neurological: Motor B/l UE 5/5.  B/l LE 3+-4-/5 HF, KE and 3+/5 ADF. --stable. Sensory decr 1/2 below level of injury to LT/proprioception Skin:Warm and dry. Intact. PICC LUE Psychiatric:  pleasant  Assessment/Plan: 1. Paraplegia and functional deficits secondary to T8 myelopathy, left wrist fracture which require 3+ hours per day of interdisciplinary therapy in a comprehensive inpatient rehab setting. Physiatrist is providing close team supervision and 24 hour management of active medical problems listed below. Physiatrist and rehab team continue to assess barriers to discharge/monitor patient progress toward functional and medical  goals.  Function:  Bathing Bathing position Bathing activity did not occur: N/A Position: Wheelchair/chair at sink  Bathing parts Body parts bathed by patient: Right arm, Left arm, Chest, Abdomen, Front perineal area, Right upper leg, Left upper leg Body parts bathed by helper: Buttocks, Right lower leg, Left lower leg  Bathing assist Assist Level: Assistive device, 2 helpers Assistive Device Comment: reacher    Upper Body Dressing/Undressing Upper body dressing Upper body dressing/undressing activity did not occur: N/A What is the patient wearing?: Pull over shirt/dress Bra - Perfomed by patient: Thread/unthread right bra strap, Thread/unthread left bra strap Bra - Perfomed by helper: Hook/unhook bra (pull down sports bra) Pull over shirt/dress - Perfomed by patient: Thread/unthread right sleeve, Thread/unthread left sleeve, Put head through opening, Pull shirt over trunk Pull over shirt/dress - Perfomed by helper: Put head through opening Button up shirt - Perfomed by patient: Thread/unthread right sleeve, Thread/unthread left sleeve, Button/unbutton shirt Button up shirt - Perfomed by helper: Pull shirt around back    Upper body assist Assist Level: Supervision or verbal cues      Lower Body Dressing/Undressing Lower body dressing Lower body dressing/undressing activity did not occur: N/A What is the patient wearing?: Socks, Shoes     Pants- Performed by patient: Thread/unthread left pants leg, Thread/unthread right pants leg Pants- Performed by helper: Pull pants up/down   Non-skid slipper socks- Performed by helper: Don/doff right sock, Don/doff left sock   Socks - Performed by helper: Don/doff right sock, Don/doff left sock   Shoes - Performed by helper: Don/doff  right shoe, Don/doff left shoe, Fasten right, Fasten left   AFO - Performed by helper: Don/doff left AFO      Lower body assist Assist for lower body dressing: 2 Helpers      Toileting Toileting Toileting  activity did not occur: Safety/medical concerns Toileting steps completed by patient: Performs perineal hygiene Toileting steps completed by helper: Adjust clothing prior to toileting, Performs perineal hygiene, Adjust clothing after toileting Toileting Assistive Devices: Other (comment)(stedy)  Toileting assist Assist level: Touching or steadying assistance (Pt.75%)   Transfers Chair/bed transfer   Chair/bed transfer method: Stand pivot Chair/bed transfer assist level: Moderate assist (Pt 50 - 74%/lift or lower) Chair/bed transfer assistive device: Armrests, Walker, Orthosis Mechanical lift: Stedy   Locomotion Ambulation Ambulation activity did not occur: Safety/medical concerns   Max distance: 25 Assist level: 2 helpers   Wheelchair   Type: Manual Max wheelchair distance: 150 Assist Level: Touching or steadying assistance (Pt > 75%)  Cognition Comprehension Comprehension assist level: Follows complex conversation/direction with no assist  Expression Expression assist level: Expresses complex ideas: With no assist  Social Interaction Social Interaction assist level: Interacts appropriately with others with medication or extra time (anti-anxiety, antidepressant).  Problem Solving Problem solving assist level: Solves complex problems: With extra time  Memory Memory assist level: More than reasonable amount of time   Medical Problem List and Plan: 1.Paraplegia and funtional deficitssecondary to T8 meningioma/resection with myeopathy -recent left wrist fracture, now in left wrist splint -Continue CIR  -team conference today. Consider extending stay vs Short term SNF 2. 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  3. Pain Management:Hydrocodone prn 4. Mood:LCSW to follow for evaluation and support. 5. Neuropsych: This  patientiscapable of making decisions on herown behalf. 6. Skin/Wound Care:Routine pressure relief measures. 7. Fluids/Electrolytes/Nutrition:Monitor I/O.    -potassium 4.1 on 6/4, continue 88meq bid  -mg 1.8 today 6/4, cont mg supplement  400mg  TID  --serial labs  --soft diet 8. Post op ileus/neurogenic bowel:    -nausea related to meds?    -continue IV reglan through today. Consider change to oral tomorrow  -encourage OOB to tolerance  -replace Electrolytes as above  -changed diet back to soft diet, appears to be tolerating 9.Neurogenic bladder: foley out bladder incontinence  10. GERD:managed withPPIbid. 11. Hyponatremia:  Reslved   -hctz stopped  Na+ 131 on 6/4-   Cont to monitor 12.Leftwrist fracture: NWB. [place] 13. Dyspepsia  -protonix  -prn maalox 14. Acute lower UTI:  -E Coli  -Keflex changed to fosfomycin x 2 doses 15. Acute blood loss anemia  Hemoglobin 10.9 6/3      LOS (Days) [place] EVALUATION WAS PERFORMED  Meredith Staggers, MD 04/18/2018 8:58 AM

## 2018-04-18 NOTE — Progress Notes (Signed)
Instructed on procedure. HOB less than 45*. Pt held breath with line removal and pressure held for 5 min. No s/sx of bleeding at this time. Pressure drsg applied, instructed to remain in bed for 30 min, monitor for s/sx of bleeding and leave pressure drsg CDI for 24 hours. Pt VU. Fran Lowes, RN VAST

## 2018-04-18 NOTE — Progress Notes (Addendum)
Nutrition Follow-up  DOCUMENTATION CODES:   Obesity unspecified  INTERVENTION:  Continue soft diet as tolerated.  Encourage adequate PO intake.   NUTRITION DIAGNOSIS:   Inadequate oral intake related to altered GI function as evidenced by (post op ileus, liquid diet); improving  GOAL:   Patient will meet greater than or equal to 90% of their needs; progressing  MONITOR:   PO intake, Diet advancement, Weight trends, Labs, Skin, I & O's  REASON FOR ASSESSMENT:   Low Braden    ASSESSMENT:   62 year old female with history of hypertension, gait disorder with falls weakness and decreased sensation left lower extremity greater than right lower extremity since October 2018. Work-up revealed T7-T8 mass with marked cord compression and large enhancing tail emanating from dural based mass extending around the cord.  She did sustain a fall prior to her evaluation and required ORIF left wrist on 03/04/2018 by Dr. Caralyn Guile.   She was evaluated by [facility] and underwent T7-T8 tumor resection   Pt is currently on a soft diet. Meal completion has been 50-80% and has been tolerating her food at meals. Noted Prostat has been discontinued due to poor acceptance. Noted pt has refused Boost Breeze and Ensure/Boost in the past. Family has been encourage po intake at meals. Labs and medications reviewed. RD to continue to monitor for tolerance.   Diet Order:   Diet Order           DIET SOFT Room service appropriate? Yes; Fluid consistency: Thin  Diet effective now          EDUCATION NEEDS:   Not appropriate for education at this time  Skin:  Skin Assessment: Skin Integrity Issues: Skin Integrity Issues:: Incisions Incisions: back, neck  Last BM:  6/3  Height:   Ht Readings from Last 1 Encounters:  03/21/18 5\' 4"  (1.626 m)    Weight:   Wt Readings from Last 1 Encounters:  04/15/18 149 lb (67.6 kg)    Ideal Body Weight:  54.5 kg  BMI:  Body mass index is 25.58  kg/m.  Estimated Nutritional Needs:   Kcal:  1850-2000  Protein:  85-100 grams  Fluid:  1.8 - 2 L/day    Hannah Parker, MS, RD, LDN Pager # [phone] After hours/ weekend pager # [phone]

## 2018-04-18 NOTE — Progress Notes (Signed)
Physical Therapy Session Note  Patient Details  Name: Hannah Jefferson MRN: [id number] Date of Birth: May 25, 1956  Today's Date: 04/18/2018 PT Individual Time: 0905-0930 PT Individual Time Calculation (min): 25 min    Short Term Goals: Week 3:  PT Short Term Goal 1 (Week 3): Pt will transfer in/out of w/c with +1 assist in 50% of observations.  PT Short Term Goal 2 (Week 3): Pt will initiate standard w/c propulsion for increased UE strength and overall activity tolerance.  PT Short Term Goal 3 (Week 3): Pt will recall and demonstrate lateral leans in w/c to assist with pressure relief.  PT Short Term Goal 4 (Week 3): Pt will tolerate standing with BUE support x1 minute.   Skilled Therapeutic Interventions/Progress Updates:   Pt in TIS and agreeable to therapy, denies pain but reports increased fatigue this session as she just finished w/ OT. Performed kinetron 20-25 reps @ level 50 cm/sec for LE strengthening. 10 bouts total. Pt required prolonged rest in between each bout 2/2 fatigue. Pt reported feeling sick after 8 bouts, took rest tilted back w/ water to sip and cool wash cloth. Pt felt better after a few minutes and was able to complete 2 more bouts successfully. Ended session in TIS and in care of family, all needs met.   Therapy Documentation Precautions:  Precautions Precautions: Fall, Back Precaution Comments: LUE NWB in splint Restrictions Weight Bearing Restrictions: Yes LUE Weight Bearing: Weight bear through elbow only(as tolerated) RLE Weight Bearing: Weight bearing as tolerated  See Function Navigator for Current Functional Status.   Therapy/Group: Individual Therapy  [name] 04/18/2018, 9:30 AM

## 2018-04-18 NOTE — Progress Notes (Signed)
Occupational Therapy Session Note  Patient Details  Name: Hannah Jefferson MRN: [id number] Date of Birth: [birth year]  Today's Date: 04/18/2018 OT Individual Time: 0800-0900 OT Individual Time Calculation (min): 60 min    Short Term Goals: Week 4:  OT Short Term Goal 1 (Week 4): Pt will be able to squat pivot to w/c with max A of 1.  OT Short Term Goal 2 (Week 4): Pt will pull pants over hips with max A for standing in Stedy OT Short Term Goal 3 (Week 4): Pt will don socks using AE with min A OT Short Term Goal 4 (Week 4): Pt will don shoes using AE PRN with min A  Skilled Therapeutic Interventions/Progress Updates:    Pt resting in bed upon arrival and agreeable to therapy.  Pt performed supine>sit EOB with contact guard in preparation for transfer with Stedy to [facility].  Pt incontinent of bowel and required assistance for hygiene.  Pt completed bathing tasks and UB bathing/dressing tasks before transferring to w/c to complete LB dressing tasks.  Pt with incontinent bowel episodes X 3 during session.  Pt uses reacher to assist with threading pants but required assistance with donning socks and shoes.  Pt performed sit<>stand X 5 for toileting tasks with min A. Pt commented that she was exhausted at end of session.  Pt remained in w/c seated at sink with sister-in-law present.   Therapy Documentation Precautions:  Precautions Precautions: Fall, Back Precaution Comments: LUE NWB in splint Restrictions Weight Bearing Restrictions: Yes LUE Weight Bearing: Weight bear through elbow only(as tolerated) RLE Weight Bearing: Weight bearing as tolerated  Pain:  Pt denies pain  See Function Navigator for Current Functional Status.   Therapy/Group: Individual Therapy  [name] 04/18/2018, 1:02 PM

## 2018-04-18 NOTE — Progress Notes (Signed)
Algis Liming , PA notified of PICC occlusion, will assess upon rounding

## 2018-04-19 ENCOUNTER — Inpatient Hospital Stay (HOSPITAL_COMMUNITY): Payer: 59 | Admitting: Physical Therapy

## 2018-04-19 ENCOUNTER — Encounter (HOSPITAL_COMMUNITY): Payer: 59 | Admitting: Psychology

## 2018-04-19 ENCOUNTER — Inpatient Hospital Stay (HOSPITAL_COMMUNITY): Payer: 59

## 2018-04-19 MED ORDER — METOCLOPRAMIDE HCL 5 MG PO TABS
10.0000 mg | ORAL_TABLET | Freq: Three times a day (TID) | ORAL | Status: DC
  Administered 2018-04-19 – 2018-04-20 (×4): 10 mg via ORAL
  Filled 2018-04-19 (×4): qty 2

## 2018-04-19 MED ORDER — HYDROCORTISONE ACETATE 25 MG RE SUPP: 25.0000 mg | Freq: Two times a day (BID) | RECTAL | 0 refills | Status: DC

## 2018-04-19 MED ORDER — MUSCLE RUB 10-15 % EX CREA: 1.0000 "application " | TOPICAL_CREAM | CUTANEOUS | 0 refills | Status: DC | PRN

## 2018-04-19 MED ORDER — BISACODYL 10 MG RE SUPP: 10.0000 mg | RECTAL | 0 refills | Status: DC

## 2018-04-19 MED ORDER — GERHARDT'S BUTT CREAM: 1.0000 "application " | TOPICAL_CREAM | Freq: Three times a day (TID) | CUTANEOUS | Status: DC | PRN

## 2018-04-19 MED ORDER — BISACODYL 10 MG RE SUPP
10.0000 mg | RECTAL | Status: DC
  Administered 2018-04-20: 10 mg via RECTAL
  Filled 2018-04-19: qty 1

## 2018-04-19 MED ORDER — ACETAMINOPHEN 325 MG PO TABS: 325.0000 mg | ORAL_TABLET | ORAL | Status: DC | PRN

## 2018-04-19 NOTE — Progress Notes (Addendum)
[facility] PHYSICAL MEDICINE & REHABILITATION     PROGRESS NOTE    Subjective/Complaints: Feeling well. Tolerating diet. Bowels moving.   ROS: Patient denies fever, rash, sore throat, blurred vision, nausea, vomiting, diarrhea, cough, shortness of breath or chest pain, joint or back pain, headache, or mood change.   Objective: Vital Signs: Blood pressure (!) 91/59, pulse 86, temperature 98.4 F (36.9 C), temperature source Oral, resp. rate 18, height 5\' 4"  (1.626 m), weight 67.6 kg (149 lb), SpO2 98 %. No results found. Recent Labs    04/17/18 0418  WBC 5.0  HGB 10.9*  HCT 32.6*  PLT 217   Recent Labs    04/18/18 0511  NA 131*  K 4.1  CL 95*  GLUCOSE 106*  BUN 6  CREATININE 0.57  CALCIUM 8.9   CBG (last 3)  No results for input(s): GLUCAP in the last 72 hours.  Wt Readings from Last 3 Encounters:  04/15/18 67.6 kg (149 lb)  03/20/18 93.4 kg (206 lb)  02/15/18 94.1 kg (207 lb 8 oz)    Physical Exam:  Constitutional: No distress . Vital signs reviewed. HEENT: EOMI, oral membranes moist Neck: supple Cardiovascular: RRR without murmur. No JVD    Respiratory: CTA Bilaterally without wheezes or rales. Normal effort    GI: BS +, non-tender, non-distended  Neurological: Motor B/l UE 5/5.  B/l LE 3+-4-/5 HF, KE and 3+/5 ADF. --stable. Sensory decr 1/2 below level of injury to LT/proprioception Skin:Warm and dry. PICC out Psychiatric:  pleasant  Assessment/Plan: 1. Paraplegia and functional deficits secondary to T8 myelopathy, left wrist fracture which require 3+ hours per day of interdisciplinary therapy in a comprehensive inpatient rehab setting. Physiatrist is providing close team supervision and 24 hour management of active medical problems listed below. Physiatrist and rehab team continue to assess barriers to discharge/monitor patient progress toward functional and medical goals.  Function:  Bathing Bathing position Bathing activity did not occur:  N/A Position: Wheelchair/chair at sink  Bathing parts Body parts bathed by patient: Right arm, Left arm, Chest, Abdomen, Front perineal area, Right upper leg, Left upper leg Body parts bathed by helper: Buttocks, Right lower leg, Left lower leg  Bathing assist Assist Level: Assistive device, 2 helpers Assistive Device Comment: reacher    Upper Body Dressing/Undressing Upper body dressing Upper body dressing/undressing activity did not occur: N/A What is the patient wearing?: Pull over shirt/dress Bra - Perfomed by patient: Thread/unthread right bra strap, Thread/unthread left bra strap Bra - Perfomed by helper: Hook/unhook bra (pull down sports bra) Pull over shirt/dress - Perfomed by patient: Thread/unthread right sleeve, Thread/unthread left sleeve, Put head through opening, Pull shirt over trunk Pull over shirt/dress - Perfomed by helper: Put head through opening Button up shirt - Perfomed by patient: Thread/unthread right sleeve, Thread/unthread left sleeve, Button/unbutton shirt Button up shirt - Perfomed by helper: Pull shirt around back    Upper body assist Assist Level: Supervision or verbal cues      Lower Body Dressing/Undressing Lower body dressing Lower body dressing/undressing activity did not occur: N/A What is the patient wearing?: Pants, Socks, Shoes, AFO     Pants- Performed by patient: Thread/unthread left pants leg, Thread/unthread right pants leg Pants- Performed by helper: Pull pants up/down   Non-skid slipper socks- Performed by helper: Don/doff right sock, Don/doff left sock   Socks - Performed by helper: Don/doff right sock, Don/doff left sock   Shoes - Performed by helper: Don/doff right shoe, Don/doff left shoe, Fasten right, [employer]  left   AFO - Performed by helper: Don/doff left AFO      Lower body assist Assist for lower body dressing: 2 Helpers      Toileting Toileting Toileting activity did not occur: Safety/medical concerns Toileting steps  completed by patient: Performs perineal hygiene Toileting steps completed by helper: Adjust clothing prior to toileting, Adjust clothing after toileting Toileting Assistive Devices: Other (comment)(stedy)  Toileting assist Assist level: Touching or steadying assistance (Pt.75%)   Transfers Chair/bed transfer   Chair/bed transfer method: Stand pivot Chair/bed transfer assist level: Moderate assist (Pt 50 - 74%/lift or lower) Chair/bed transfer assistive device: Armrests, Walker, Orthosis Mechanical lift: Ecologist Ambulation activity did not occur: Safety/medical concerns   Max distance: 15 Assist level: 2 helpers(mod for gait, +2 for w/c follow)   Wheelchair   Type: Manual Max wheelchair distance: 150 Assist Level: Touching or steadying assistance (Pt > 75%)  Cognition Comprehension Comprehension assist level: Follows complex conversation/direction with no assist  Expression Expression assist level: Expresses complex ideas: With no assist  Social Interaction Social Interaction assist level: Interacts appropriately with others with medication or extra time (anti-anxiety, antidepressant).  Problem Solving Problem solving assist level: Solves complex problems: With extra time  Memory Memory assist level: More than reasonable amount of time   Medical Problem List and Plan: 1.Paraplegia and funtional deficitssecondary to T8 meningioma/resection with myeopathy -recent left wrist fracture, now in left wrist splint -Continue CIR  -will likely need SNF 2. 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  3. Pain Management:Hydrocodone prn 4. Mood:LCSW to follow for evaluation and support. 5. Neuropsych: This patientiscapable of making decisions on herown behalf. 6. Skin/Wound Care:Routine pressure relief  measures. 7. Fluids/Electrolytes/Nutrition:Monitor I/O.    -potassium 4.1 on 6/4, continue 59meq bid  -mg 1.8  6/4, cont mg supplement  400mg  TID  --serial labs  --soft diet 8. Post op ileus/neurogenic bowel:    -nausea related to abx   -change to oral reglan today  -encourage OOB to tolerance  -replace Electrolytes as above  -adv to regular diet 9.Neurogenic bladder: foley out bladder incontinence  10. GERD:managed withPPIbid. 11. Hyponatremia:  Reslved   -hctz stopped  Na+ 131 on 6/4-   Cont to monitor 12.Leftwrist fracture: NWB. [place] 13. Dyspepsia  -protonix  -prn maalox 14. Acute lower UTI:  -E Coli  -Keflex changed to fosfomycin x 2 doses 15. Acute blood loss anemia  Hemoglobin 10.9 6/3    Mobility Assessment  Lilibeth Opie was seen today for the purpose of a mobility assessment for a wheelchair. I have reviewed and agree with the detailed PT evaluation. This patient suffers from paraplegia related to thoracic cord compression. Due to her paraplegia, this patient is unable to utilize a cane or walker. The patient is appropriate for a customized manual wheelchair. Specifically, the patient requires an ultra lightweight manual  wheelchair.  With this wheelchair the patient can move independently at a household level and in the community. The chair will also allow the patient to perform ADL's and self-care tasks. The patient is competent to operate the recommended chair on her own and is motivated to utilize the chair on a daily basis.     Meredith Staggers, MD, [place]       LOS (Days) [place] EVALUATION WAS PERFORMED  Celesta Gentile  Naaman Plummer, MD 04/19/2018 8:49 AM

## 2018-04-19 NOTE — Progress Notes (Signed)
Physical Therapy Session Note  Patient Details  Name: Hannah Jefferson MRN: [id number] Date of Birth: [birth year]  Today's Date: 04/19/2018 PT Individual Time: 1445-1600 PT Individual Time Calculation (min): 75 min   Short Term Goals: Week 4:  PT Short Term Goal 1 (Week 4): =LTGs due to ELOS  Skilled Therapeutic Interventions/Progress Updates:    Pt received supine in bed, agreeable to PT and requests to use bathroom. No complaints of pain. Supine to sit with mod A. Sit to stand mod A to [place]. Stand pivot transfer bed to commode with PFRW and mod A, v/c for steps. Pt is dependent for clothing management prior to sitting. Pt tolerates standing x 5 min in stedy for dependent pericare and brief change following toileting. Stedy transfer commode to w/c. Manual w/c propulsion x 150 ft with BUE and Supervision. Ambulation 3 x 20 ft with PFRW and mod A for balance, w/c follow for safety. Pt exhibits scissoring gait pattern and narrow BOS, is able to correct with verbal cueing. Pt also exhibits forward trunk flexion during gait and needs cues for hip ext and upright posture. Standing table top activity with use of RUE while LUE supported on platform and mod A for balance. Pt tolerates standing with one UE support 4 x 30 sec each. Stedy transfer back to bed. Sit to supine mod A for LE. Pt left supine in bed with needs in reach, family present.  Therapy Documentation Precautions:  Precautions Precautions: Fall, Back Precaution Comments: LUE NWB in splint Restrictions Weight Bearing Restrictions: Yes LUE Weight Bearing: Weight bear through elbow only(as tolerated) RLE Weight Bearing: Weight bearing as tolerated  See Function Navigator for Current Functional Status.   Therapy/Group: Individual Therapy  Excell Seltzer, PT, DPT  04/19/2018, 4:51 PM

## 2018-04-19 NOTE — Progress Notes (Signed)
Occupational Therapy Session Note  Patient Details  Name: Hannah Jefferson MRN: [id number] Date of Birth: Dec 11, 1955  Today's Date: 04/19/2018 OT Individual Time: 0800-0900 OT Individual Time Calculation (min): 60 min    Short Term Goals: Week 4:  OT Short Term Goal 1 (Week 4): Pt will be able to squat pivot to w/c with max A of 1.  OT Short Term Goal 2 (Week 4): Pt will pull pants over hips with max A for standing in Stedy OT Short Term Goal 3 (Week 4): Pt will don socks using AE with min A OT Short Term Goal 4 (Week 4): Pt will don shoes using AE PRN with min A  Skilled Therapeutic Interventions/Progress Updates:    OT intervention with focus on bed mobility, sit<>stand with Stedy, LB dressing, toilet hygiene, activity tolerance, and safety awareness.  Pt required min A for bed mobility and sit<>stand with Stedy.  Pt incontinent of bowel and transferred to [facility] with Stedy.  Pt continued to have incontinent episodes when standing (3 times). Pt required assistance with cleaning buttocks but able to complete front pericare. Pt used reacher to thread pants but required assistance with donning socks and shoes.  Pt performed sit<>stand X 6 during session with pt verbalizing fatigue at end of session.  Pt remained in w/c at sink to complete UB dressing tasks and grooming with sister-in-law providing assistance PRN.    Therapy Documentation Precautions:  Precautions Precautions: Fall, Back Precaution Comments: LUE NWB in splint Restrictions Weight Bearing Restrictions: Yes LUE Weight Bearing: Weight bear through elbow only(as tolerated) RLE Weight Bearing: Weight bearing as tolerated    Vital Signs: Therapy Vitals Temp: 98.4 F (36.9 C) Temp Source: Oral Pulse Rate: 86 Resp: 18 BP: (!) 91/59 Patient Position (if appropriate): Lying Oxygen Therapy SpO2: 98 % O2 Device: Room Air Pain: Pain Assessment Pain Scale: 0-10 Pain Score: 0-No pain    See Function Navigator for Current  Functional Status.   Therapy/Group: Individual Therapy  [name] 04/19/2018, 9:03 AM

## 2018-04-19 NOTE — Progress Notes (Signed)
Physical Therapy Session Note  Patient Details  Name: Hannah Jefferson MRN: [id number] Date of Birth: Sep 01, 1956  Today's Date: 04/19/2018 PT Individual Time: 1000-1100 PT Individual Time Calculation (min): 60 min   Short Term Goals: Week 4:  PT Short Term Goal 1 (Week 4): =LTGs due to ELOS  Skilled Therapeutic Interventions/Progress Updates:    no c/o pain.  Session focus on NMR via functional transfers and gait, core strengthening, and w/c mobility in ultra lightweight w/c.    Sit<>stand throughout session from TIS w/c and from therapy mat with mod assist, mod cues for foot placement, and forward weight shift.  Gait training x30' +10' with platform RW.  On first trial pt requires mod assist for gait with min cues for foot placement, upright posture, and walker positioning.  On second trial, pt with decreased ability to manage walker and maintain upright/midline posture so discontinued activity in favor of rest break.  Stand/pivot to R and L throughout session with platform RW and mod assist +1, max cues for foot placement and sequencing.  Pt completes 2x10 reps modified sit ups with 2# weighted ball for core strengthening and activity tolerance.  W/C mobility back to room in ultra lightweight w/c with min cues for technique and supervision overall.  Positioned upright in ultra lightweight w/c with call bell in reach and needs met.   Therapy Documentation Precautions:  Precautions Precautions: Fall, Back Precaution Comments: LUE NWB in splint Restrictions Weight Bearing Restrictions: Yes LUE Weight Bearing: Weight bear through elbow only(as tolerated) RLE Weight Bearing: Weight bearing as tolerated   See Function Navigator for Current Functional Status.   Therapy/Group: Individual Therapy  Michel Santee 04/19/2018, 11:01 AM

## 2018-04-19 NOTE — Consult Note (Signed)
Neuropsychological Consultation   Patient:   Hannah Jefferson   DOB:   December 19, 1955  MR Number:  [id number]  Location:  [place] [facility] [address] [id number] [place] [address] Dept: [phone] SEG: [phone]           Date of Service:   04/19/2018  Start Time:   1 PM End Time:   2 PM  Provider/Observer:  Ilean Skill, Psy.D.       Clinical Neuropsychologist       Billing Code/Service: [phone] Units  Chief Complaint:    Hannah Jefferson is a 62 year old female with history of hypertension, gait disorder with falls, weakness and decreased sensation in left lower extremity greater than right lower extremity dating to October 2018.  Work-up revealed T7-T8 mass with marked cord compression and large enhancing tail emanating from dural based mass extending around the cord.  Mass felt to be a meningioma.  Underwent tumor resection.  Patient has continued have pain with significant, but improving, motor function.  The patient was able to walk 30 feet today.  Patient has been dealing with depression and worry about future physical limitation.  Patient and sister-in-law report brief crying spells.  Reason for Service:  [name] was referred for neuropsychological consultation due to coping and adjustment issues.  Below is the HPI for the current admission.  Hannah Jefferson a 62 year old female with history of hypertension, gait disorder with falls weakness and decreased sensation left lower extremity greater than right lower extremity since October 2018.She has had decline in mobility requiring cane and was continent of bowel and bladder.Work-up revealed T7-T8 mass with marked cord compression and large enhancing tail emanating from dural based mass extending around the cord. Mass felt to be a meningioma and she was started on decadron by Dr. Felecia Shelling and referred to neurosurgery for input. She did sustain  a fall prior to her evaluation and required ORIF left wrist on 03/04/2018 by Dr. Caralyn Guile.She was evaluated by [facility] underwent T7-T8 tumor resection by Dr. Redmond Pulling.  Postop course significant for abdominal pain due to adynamic ileus requiring NG tube for decompression. This was removed on 5 /5and dietadvancedto regular's butreported to have a lot of diarrhea last night. She has also had issues with urinary retention probably requiring foley placement on 5/78for bladder rest and voiding trial of 5/5 was unsuccessful therefore Foley was replaced. Hyponatremia treated with normal saline. Surveillance Doppler showed acute right peroneal DVT on 5 2 and repeat Dopplers recommended in 1 week for follow-up. Currently patient continues to have deficits in mobility and self-care and CIR was recommended due to thoracic myelopathy.  Current Status:  Patient reports that mood has improved some with improving ability to stand and walk short distances.   Acknowledges frustration about not being ready to go home yet but is dealing with likely transfer to skilled nursing after discharge from CIR.    Behavioral Observation: Hannah Jefferson  presents as a 62 y.o.-year-old Right Caucasian Female who appeared her stated age. her dress was Appropriate and she was Well Groomed and her manners were Appropriate to the situation.  her participation was indicative of Appropriate and Attentive behaviors.  There were any physical disabilities noted.  she displayed an appropriate level of cooperation and motivation.     Interactions:    Active Appropriate and Attentive  Attention:   abnormal and attention span appeared shorter than expected for  age  Memory:   within normal limits; recent and remote memory intact  Visuo-spatial:  within normal limits  Speech (Volume):  low  Speech:   normal; normal  Thought Process:  Coherent and Relevant  Though Content:  WNL; not suicidal and not  homicidal  Orientation:   person, place, time/date and situation  Judgment:   Good  Planning:   Good  Affect:    Depressed  Mood:    Dysphoric  Insight:   Good  Intelligence:   normal  Medical History:   Past Medical History:  Diagnosis Date  . Chronic left hip pain   . GERD (gastroesophageal reflux disease)   . Gestational diabetes   . Hearing loss of both ears   . Hypertension   . Neuropathy    numbness/tingling from waist down  . Vision abnormalities    Psychiatric History:  No prior history of depression or other psychiatric issues.  Family Med/Psych History:  Family History  Problem Relation Age of Onset  . Dementia Mother   . COPD Mother   . Heart disease Father   . Thyroid nodules Sister   . Diabetes Mellitus II Sister   . Heart disease Brother   . Diabetes Mellitus II Brother   . Diabetes Mellitus II Brother   . Heart disease Brother     Risk of Suicide/Violence: virtually non-existent Patient denies SI or HI.  Impression/DX:  Hannah Jefferson is a 62 year old female with history of hypertension, gait disorder with falls, weakness and decreased sensation in left lower extremity greater than right lower extremity dating to October 2018.  Work-up revealed T7-T8 mass with marked cord compression and large enhancing tail emanating from dural based mass extending around the cord.  Mass felt to be a meningioma.  Underwent tumor resection.  Patient has continued have pain with significant, but improving, motor function.  The patient was able to walk 30 feet today.  Patient has been dealing with depression and worry about future physical limitation.  Patient and sister-in-law report brief crying spells.  Patient reports that mood has improved some with improving ability to stand and walk short distances.   Acknowledges frustration about not being ready to go home yet but is dealing with likely transfer to skilled nursing after discharge from CIR.    Disposition/Plan:  Will see the patient again next week if not transferred yet.        Electronically Signed   _______________________ Ilean Skill, Psy.D.

## 2018-04-19 NOTE — Plan of Care (Signed)
  Problem: Consults Goal: RH SPINAL CORD INJURY PATIENT EDUCATION Description  See Patient Education module for education specifics.  Outcome: Progressing   Problem: SCI BOWEL ELIMINATION Goal: RH STG MANAGE BOWEL WITH ASSISTANCE Description STG Manage Bowel with mod Assistance.   Outcome: Progressing Flowsheets (Taken 04/19/2018 1546) STG: Pt will manage bowels with assistance: 1-Total assistance Goal: RH STG SCI MANAGE BOWEL PROGRAM W/ASSIST OR AS APPROPRIATE Description STG SCI Manage bowel program w/mod assist or as appropriate.   Outcome: Progressing Flowsheets (Taken 04/19/2018 1546) STG: SCI Pt will manage bowels with assistance or as appropriate: Total assist   Problem: SCI BLADDER ELIMINATION Goal: RH STG MANAGE BLADDER WITH ASSISTANCE Description STG Manage Bladder With mod Assistance   Outcome: Progressing Flowsheets (Taken 04/19/2018 1546) STG: Pt will manage bladder with assistance: 1-Total assistance Goal: RH STG MANAGE BLADDER WITH EQUIPMENT WITH ASSISTANCE Description STG Manage Bladder With Equipment With mod Assistance   Outcome: Progressing Flowsheets (Taken 04/19/2018 1546) STG: Pt will manage bladder with equipment with assistance: 1-Total assistance   Problem: RH SKIN INTEGRITY Goal: RH STG SKIN FREE OF INFECTION/BREAKDOWN Description min   Outcome: Progressing   Problem: RH SAFETY Goal: RH STG ADHERE TO SAFETY PRECAUTIONS W/ASSISTANCE/DEVICE Description STG Adhere to Safety Precautions With min Assistance/Device.  Outcome: Progressing Flowsheets (Taken 04/19/2018 1546) STG:Pt will adhere to safety precautions with assistance/device: 3-Moderate assistance   Problem: RH PAIN MANAGEMENT Goal: RH STG PAIN MANAGED AT OR BELOW PT'S PAIN GOAL Description 2 or less  Outcome: Progressing

## 2018-04-20 ENCOUNTER — Inpatient Hospital Stay (HOSPITAL_COMMUNITY): Payer: 59

## 2018-04-20 ENCOUNTER — Inpatient Hospital Stay (HOSPITAL_COMMUNITY): Payer: 59 | Admitting: Physical Therapy

## 2018-04-20 MED ORDER — HYDROCORTISONE ACE-PRAMOXINE 1-1 % RE FOAM
1.0000 | Freq: Three times a day (TID) | RECTAL | Status: DC
  Administered 2018-04-20 – 2018-04-27 (×20): 1 via RECTAL
  Filled 2018-04-20 (×3): qty 10

## 2018-04-20 MED ORDER — METOCLOPRAMIDE HCL 5 MG PO TABS
10.0000 mg | ORAL_TABLET | Freq: Three times a day (TID) | ORAL | Status: DC
  Administered 2018-04-20 – 2018-04-21 (×3): 10 mg via ORAL
  Filled 2018-04-20 (×3): qty 2

## 2018-04-20 NOTE — Progress Notes (Signed)
Occupational Therapy Session Note  Patient Details  Name: KAMBRE MESSNER MRN: [id number] Date of Birth: [birth year]  Today's Date: 04/20/2018 OT Individual Time: 0700-0757 OT Individual Time Calculation (min): 57 min    Short Term Goals: Week 5:  OT Short Term Goal 1 (Week 5): STG=LTG secondary to ELOS  Skilled Therapeutic Interventions/Progress Updates:    OT intervention with focus on bathing/dressing tasks seated on BSC and w/c at sink.  Pt performed supine>sit EOB at supervision level.  Pt required min A for sit<>stand with Stedy for transfer to [facility]. Pt continues to have loose BMs.  Pt performed sit<>stand from [facility] with Stedy X 4 requiring min A.  Pt transferred to w/c and thread BLE into pants and donned socks with AE.  Pt performed sit<>stand with RW at mod A.  Pt required assistance pulling up pants.  Pt remained in w/c with sister-in-law to complete UB bathing/dressing tasks.   Therapy Documentation Precautions:  Precautions Precautions: Fall, Back Precaution Comments: LUE WBAT with splint Restrictions Weight Bearing Restrictions: Yes LUE Weight Bearing: Weight bear through elbow only(as tolerated) RLE Weight Bearing: Weight bearing as tolerated General:   Pain:  Pt denies pain  See Function Navigator for Current Functional Status.   Therapy/Group: Individual Therapy  [name] 04/20/2018, 7:59 AM

## 2018-04-20 NOTE — Progress Notes (Signed)
Occupational Therapy Session Note  Patient Details  Name: Hannah Jefferson MRN: [id number] Date of Birth: [birth year]  Today's Date: 04/20/2018 OT Individual Time: 1400-1425 OT Individual Time Calculation (min): 25 min    Short Term Goals: Week 5:  OT Short Term Goal 1 (Week 5): STG=LTG secondary to ELOS  Skilled Therapeutic Interventions/Progress Updates:    Pt resting in bed upon arrival with sister-in-law present.  Pt stated she wasn't "feeling good" but wanted to get OOB and "do what she could." Pt required min A to removed RLE off EOB but completed remainder of supine>sit EOB without assistance.  Pt c/o lightheadedness which resolved <30 secs. Pt transferred to w/c with Stedy, performing sit<>stand from EOB without assistance.  Pt completed grooming tasks at sink.  Pt's BP 93/57, HR 109. Pt remained in w/c with all needs within reach and sister-in-law present.   Therapy Documentation Precautions:  Precautions Precautions: Fall, Back Precaution Comments: LUE NWB in splint Restrictions Weight Bearing Restrictions: Yes LUE Weight Bearing: Weight bear through elbow only(as tolerated) RLE Weight Bearing: Weight bearing as tolerated Vital Signs: Therapy Vitals Pulse Rate: (!) 107 Resp: 18 BP: 110/65 Patient Position (if appropriate): Lying Oxygen Therapy SpO2: 99 % O2 Device: Room Air Pain:  Pt denies pain  See Function Navigator for Current Functional Status.   Therapy/Group: Individual Therapy  [name] 04/20/2018, 2:45 PM

## 2018-04-20 NOTE — NC FL2 (Signed)
[place] LEVEL OF CARE SCREENING TOOL     IDENTIFICATION  Patient Name: Hannah Jefferson Birthdate: 07-29-1956 Sex: female Admission Date (Current Location): 03/21/2018  [facility] and [place] Number:  Herbalist and Address:  [facility] [place] [address], [place], [place] [address]      Provider Number: [id number]  Attending Physician Name and Address:  Meredith Staggers, MD  Relative Name and Phone Number:       Current Level of Care: Other (Comment)(Acute Inpatient Rehab) Recommended Level of Care: [place] Prior Approval Number:    Date Approved/Denied:   PASRR Number: [id number]  Discharge Plan: SNF    Current Diagnoses: Patient Active Problem List   Diagnosis Date Noted  . Ileus ([place])   . Acute blood loss anemia   . Acute deep vein thrombosis (DVT) of right femoral vein ([place])   . Acute lower UTI   . Hyponatremia   . Hypokalemia   . Hypomagnesemia   . Ileus, postoperative ([place])   . Thoracic myelopathy 03/21/2018  . Neurogenic bowel 03/21/2018  . Neurogenic bladder 03/21/2018  . Left wrist fracture, sequela 03/21/2018  . Numbness 02/15/2018  . Ataxic gait 02/15/2018  . Left leg weakness 02/15/2018  . Other fatigue 02/15/2018  . Urinary incontinence 02/15/2018  . Bowel incontinence 02/15/2018    Orientation RESPIRATION BLADDER Height & Weight     Self, Time, Situation, Place  Normal Continent(occasional incont episodes) Weight: 67.6 kg (149 lb) Height:  5\' 4"  (162.6 cm)  BEHAVIORAL SYMPTOMS/MOOD NEUROLOGICAL BOWEL NUTRITION STATUS      Continent Diet(soft diet with thin liquids)  AMBULATORY STATUS COMMUNICATION OF NEEDS Skin   Extensive Assist Verbally Other (Comment)(MASD to perineum)                       Personal Care Assistance Level of Assistance  Bathing, Dressing, Total care Bathing Assistance: Maximum assistance   Dressing Assistance: Maximum assistance Total Care  Assistance: Maximum assistance   Functional Limitations Info             SPECIAL CARE FACTORS FREQUENCY  PT (By licensed PT), OT (By licensed OT)     PT Frequency: 5x/wk OT Frequency: 5x/wk            Contractures Contractures Info: Not present    Additional Factors Info  Allergies, Code Status Code Status Info: full Allergies Info: heparin           Current Medications (04/20/2018):  This is the current hospital active medication list Current Facility-Administered Medications  Medication Dose Route Frequency Provider Last Rate Last Dose  . acetaminophen (TYLENOL) tablet 325-650 mg  325-650 mg Oral Q4H PRN Bary Leriche, PA-C   650 mg at 04/19/18 2032  . alum & mag hydroxide-simeth (MAALOX/MYLANTA) 200-200-20 MG/5ML suspension 30 mL  30 mL Oral Q4H PRN Meredith Staggers, MD      . calcium carbonate (TUMS - dosed in mg elemental calcium) chewable tablet 200 mg of elemental calcium  1 tablet Oral TID WC Love, Pamela S, PA-C   200 mg of elemental calcium at 04/20/18 1138  . diphenhydrAMINE (BENADRYL) 12.5 MG/5ML elixir 12.5-25 mg  12.5-25 mg Oral Q6H PRN Love, Ivan Anchors, PA-C      . Gerhardt's butt cream   Topical TID PRN Love, Pamela S, PA-C      . guaiFENesin-dextromethorphan (ROBITUSSIN DM) 100-10 MG/5ML syrup 5-10 mL  5-10 mL Oral  Q6H PRN Love, Ivan Anchors, PA-C      . hydrocortisone (ANUSOL-HC) suppository 25 mg  25 mg Rectal BID Bary Leriche, PA-C   25 mg at 04/20/18 0834  . hydrocortisone-pramoxine (PROCTOFOAM-HC) rectal foam 1 applicator  1 applicator Rectal TID Love, Pamela S, PA-C      . lidocaine (XYLOCAINE) 2 % jelly   Topical PRN Love, Pamela S, PA-C      . lisinopril (PRINIVIL,ZESTRIL) tablet 20 mg  20 mg Oral Daily Bary Leriche, PA-C   20 mg at 04/20/18 0834  . magnesium oxide (MAG-OX) tablet 400 mg  400 mg Oral TID Meredith Staggers, MD   400 mg at 04/20/18 0834  . methocarbamol (ROBAXIN) tablet 500 mg  500 mg Oral BID Bary Leriche, PA-C   500 mg at 04/20/18  7096  . metoCLOPramide (REGLAN) tablet 10 mg  10 mg Oral TID AC Meredith Staggers, MD   10 mg at 04/20/18 1138  . MUSCLE RUB CREA   Topical PRN Meredith Staggers, MD      . pantoprazole (PROTONIX) EC tablet 40 mg  40 mg Oral BID Meredith Staggers, MD   40 mg at 04/20/18 0835  . polyethylene glycol (MIRALAX / GLYCOLAX) packet 17 g  17 g Oral Daily PRN Bary Leriche, PA-C   17 g at 04/18/18 0802  . potassium chloride SA (K-DUR,KLOR-CON) CR tablet 40 mEq  40 mEq Oral BID Meredith Staggers, MD   40 mEq at 04/20/18 0835  . prochlorperazine (COMPAZINE) tablet 5-10 mg  5-10 mg Oral Q6H PRN Bary Leriche, PA-C   10 mg at 04/20/18 1138   Or  . prochlorperazine (COMPAZINE) injection 5-10 mg  5-10 mg Intramuscular Q6H PRN Bary Leriche, PA-C   10 mg at 04/14/18 2836   Or  . prochlorperazine (COMPAZINE) suppository 12.5 mg  12.5 mg Rectal Q6H PRN Bary Leriche, PA-C   12.5 mg at 04/16/18 6294  . pyridOXINE (VITAMIN B-6) tablet 200 mg  200 mg Oral Daily Bary Leriche, PA-C   200 mg at 04/20/18 7654  . sodium phosphate (FLEET) 7-19 GM/118ML enema 1 enema  1 enema Rectal Daily PRN Love, Pamela S, PA-C      . tamsulosin (FLOMAX) capsule 0.4 mg  0.4 mg Oral QPC supper Reesa Chew S, PA-C   0.4 mg at 04/19/18 1733  . traZODone (DESYREL) tablet 25-50 mg  25-50 mg Oral QHS PRN Love, Pamela S, PA-C      . vitamin C (ASCORBIC ACID) tablet 500 mg  500 mg Oral BID Love, Pamela S, PA-C   500 mg at 04/20/18 6503     Discharge Medications: Please see discharge summary for a list of discharge medications.  Relevant Imaging Results:  Relevant Lab Results:   Additional Information SS# [id number]  Lennart Pall, LCSW

## 2018-04-20 NOTE — Progress Notes (Signed)
[facility] PHYSICAL MEDICINE & REHABILITATION     PROGRESS NOTE    Subjective/Complaints: Tolerating regular diet. Moving bowels.   ROS: Patient denies fever, rash, sore throat, blurred vision, nausea, vomiting, diarrhea, cough, shortness of breath or chest pain, joint or back pain, headache, or mood change.    Objective: Vital Signs: Blood pressure 113/68, pulse 89, temperature 97.8 F (36.6 C), resp. rate 18, height 5\' 4"  (1.626 m), weight 67.6 kg (149 lb), SpO2 96 %. No results found. No results for input(s): WBC, HGB, HCT, PLT in the last 72 hours. Recent Labs    04/18/18 0511  NA 131*  K 4.1  CL 95*  GLUCOSE 106*  BUN 6  CREATININE 0.57  CALCIUM 8.9   CBG (last 3)  No results for input(s): GLUCAP in the last 72 hours.  Wt Readings from Last 3 Encounters:  04/15/18 67.6 kg (149 lb)  03/20/18 93.4 kg (206 lb)  02/15/18 94.1 kg (207 lb 8 oz)    Physical Exam:  Constitutional: No distress . Vital signs reviewed. HEENT: EOMI, oral membranes moist Neck: supple Cardiovascular: RRR without murmur. No JVD    Respiratory: CTA Bilaterally without wheezes or rales. Normal effort    GI: BS +, non-tender, non-distended  Neurological: Motor B/l UE 5/5.  B/l LE 3+-4-/5 HF, KE and 3+/5 ADF. --stable. Sensory decr 1/2 below level of injury to LT/proprioception persistent Skin:warm Psychiatric:  pleasant  Assessment/Plan: 1. Paraplegia and functional deficits secondary to T8 myelopathy, left wrist fracture which require 3+ hours per day of interdisciplinary therapy in a comprehensive inpatient rehab setting. Physiatrist is providing close team supervision and 24 hour management of active medical problems listed below. Physiatrist and rehab team continue to assess barriers to discharge/monitor patient progress toward functional and medical goals.  Function:  Bathing Bathing position Bathing activity did not occur: N/A Position: Wheelchair/chair at sink  Bathing parts Body  parts bathed by patient: Right arm, Left arm, Chest, Abdomen, Front perineal area, Right upper leg, Left upper leg Body parts bathed by helper: Buttocks, Right lower leg, Left lower leg  Bathing assist Assist Level: Assistive device, 2 helpers Assistive Device Comment: reacher    Upper Body Dressing/Undressing Upper body dressing Upper body dressing/undressing activity did not occur: N/A What is the patient wearing?: Pull over shirt/dress Bra - Perfomed by patient: Thread/unthread right bra strap, Thread/unthread left bra strap Bra - Perfomed by helper: Hook/unhook bra (pull down sports bra) Pull over shirt/dress - Perfomed by patient: Thread/unthread right sleeve, Thread/unthread left sleeve, Put head through opening, Pull shirt over trunk Pull over shirt/dress - Perfomed by helper: Put head through opening Button up shirt - Perfomed by patient: Thread/unthread right sleeve, Thread/unthread left sleeve, Button/unbutton shirt Button up shirt - Perfomed by helper: Pull shirt around back    Upper body assist Assist Level: Supervision or verbal cues      Lower Body Dressing/Undressing Lower body dressing Lower body dressing/undressing activity did not occur: N/A What is the patient wearing?: Pants, Socks, Shoes, AFO     Pants- Performed by patient: Thread/unthread left pants leg, Thread/unthread right pants leg Pants- Performed by helper: Pull pants up/down   Non-skid slipper socks- Performed by helper: Don/doff right sock, Don/doff left sock Socks - Performed by patient: Don/doff right sock, Don/doff left sock Socks - Performed by helper: Don/doff right sock, Don/doff left sock   Shoes - Performed by helper: Don/doff right shoe, Don/doff left shoe, Fasten right, Fasten left   AFO - Performed  by helper: Don/doff left AFO      Lower body assist Assist for lower body dressing: 2 Helpers      Toileting Toileting Toileting activity did not occur: Safety/medical concerns Toileting  steps completed by patient: Performs perineal hygiene Toileting steps completed by helper: Adjust clothing prior to toileting, Adjust clothing after toileting Toileting Assistive Devices: Other (comment)(stedy)  Toileting assist Assist level: Two helpers   Transfers Chair/bed transfer   Chair/bed transfer method: Stand pivot Chair/bed transfer assist level: Moderate assist (Pt 50 - 74%/lift or lower) Chair/bed transfer assistive device: Armrests, Walker, Orthosis Mechanical lift: Stedy   Locomotion Ambulation Ambulation activity did not occur: Safety/medical concerns   Max distance: 20' Assist level: 2 helpers(mod A for gait, +2 for w/c follow)   Wheelchair   Type: Manual Max wheelchair distance: 150' Assist Level: Supervision or verbal cues  Cognition Comprehension Comprehension assist level: Follows complex conversation/direction with no assist  Expression Expression assist level: Expresses complex ideas: With no assist  Social Interaction Social Interaction assist level: Interacts appropriately with others - No medications needed.  Problem Solving Problem solving assist level: Solves complex problems: With extra time  Memory Memory assist level: More than reasonable amount of time   Medical Problem List and Plan: 1.Paraplegia and funtional deficitssecondary to T8 meningioma/resection with myeopathy -recent left wrist fracture, now in left wrist splint -Continue CIR  -will likely need SNF 2. 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  3. Pain Management:Hydrocodone prn 4. Mood:LCSW to follow for evaluation and support. 5. Neuropsych: This patientiscapable of making decisions on herown behalf. 6. Skin/Wound Care:Routine pressure relief measures. 7. Fluids/Electrolytes/Nutrition:Monitor I/O.    -potassium 4.1 on 6/4,  continue 29meq bid  -mg 1.8  6/4, cont mg supplement  400mg  TID  --serial labs--recheck tomorrow  --reg diet 8. Post op ileus/neurogenic bowel:    -nausea related to abx   -changed to oral reglan   -encourage OOB to tolerance  -replacing Electrolytes as above  -advanced to regular diet 9.Neurogenic bladder: foley out bladder incontinence  10. GERD:managed withPPIbid. 11. Hyponatremia:  Reslved   -hctz stopped  Na+ 131 on 6/4-   Cont to monitor 12.Leftwrist fracture: NWB. [place] 13. Dyspepsia  -protonix  -prn maalox 14. Acute lower UTI:  -E Coli  -Keflex changed to fosfomycin x 2 doses 15. Acute blood loss anemia  Hemoglobin 10.9 6/3           LOS (Days) [place] EVALUATION WAS PERFORMED  Meredith Staggers, MD 04/20/2018 8:43 AM

## 2018-04-20 NOTE — Patient Care Conference (Signed)
Inpatient RehabilitationTeam Conference and Plan of Care Update Date: 04/18/2018   Time: 2:10 PM    Patient Name: Hannah Jefferson      Medical Record Number: [id number]  Date of Birth: 12-Sep-1956 Sex: Female         Room/Bed: 4W11C/4W11C-01 Payor Info: Payor: Theme park manager / Plan: Theme park manager OTHER / Product Type: *No Product type* /    Admitting Diagnosis: T7 8 MENOGIONE  RESECTION  Admit Date/Time:  03/21/2018  1:32 PM Admission Comments: No comment available   Primary Diagnosis:  Thoracic myelopathy Principal Problem: Thoracic myelopathy  Patient Active Problem List   Diagnosis Date Noted  . Ileus ([place])   . Acute blood loss anemia   . Acute deep vein thrombosis (DVT) of right femoral vein ([place])   . Acute lower UTI   . Hyponatremia   . Hypokalemia   . Hypomagnesemia   . Ileus, postoperative ([place])   . Thoracic myelopathy 03/21/2018  . Neurogenic bowel 03/21/2018  . Neurogenic bladder 03/21/2018  . Left wrist fracture, sequela 03/21/2018  . Numbness 02/15/2018  . Ataxic gait 02/15/2018  . Left leg weakness 02/15/2018  . Other fatigue 02/15/2018  . Urinary incontinence 02/15/2018  . Bowel incontinence 02/15/2018    Expected Discharge Date: Expected Discharge Date: (SNF)  Team Members Present: Physician leading conference: Dr. Alger Simons Social Worker Present: Lennart Pall, LCSW Nurse Present: Leonette Nutting, RN PT Present: Dwyane Dee, PT OT Present: Roanna Epley, [place], OT PPS Coordinator present : Daiva Nakayama, RN, CRRN     Current Status/Progress Goal Weekly Team Focus  Medical   Ileus is essentially resolved.  Patient still struggling with advancement of diet.  Has not recurrent E. coli UTI which has been treated.  Normalize bowel and bladder habits  Nutrition, ileus, treatment of UTI   Bowel/Bladder   Incontinent of Bladdr/Bowel LBM 04/17/18   less episodes of incontinence  Maintain toileting program, up to [facility] encourage     Swallow/Nutrition/ Hydration             ADL's   bathing sitting EOB-min/mod A; UB dressing-supervision; LB dressing-max A; functional transfers with [facility]; sit<>stand-min/mod A; standing balance-min A  min A overall with LB self care and transfers, s/u UB self care  activity tolerance, bed obility, sitting balance, sit<>stand, functional transfers, education   Mobility   mod assist for stand/pivot with platform RW, gait up to 25' with platform RW mod assist, lightweight w/c mobility supervision  min assist w/c level transfers; mod assist controlled environment gait goals; mod I w/c mobility  increasing endurance, functional transfers with RW, NMR, SCI education   Communication             Safety/Cognition/ Behavioral Observations            Pain   No c/o pain,  pain <3  Assess qs/prn maintaining comfort measures prn and medications   Skin   MASD to perineum area inproving continue encouraging frequent montoring with toileting, patient states she is unable at times to know she is incontinent,   no new break down  Assess QS/and prn with incontient care provided    Rehab Goals Patient on target to meet rehab goals: Yes Rehab Goals Revised: medically improving and more able to focus on rehab *See Care Plan and progress notes for long and short-term goals.     Barriers to Discharge  Current Status/Progress Possible Resolutions Date Resolved   Physician    Medical stability  Medical management as documented in the progress note      Nursing                  PT                    OT                  SLP                SW                Discharge Planning/Teaching Needs:  Following last week's conference, pt and sister-in-law reported they feel they may need to change plan to SNF as family not able to provide the level of care needed.  Will speak with team about CIR goals/ what needs to be accomplished prior to readiness for SNF transfer.  Sister-in-law present for some  sessions - teaching continues.   Team Discussion:  MD feels pt has advanced through ileus issues.  Have occasional incont issues.  Ambulated up to 25' with mod assist with PT.  Remains very motivated but goals still for min/ mod assist and family is unable to provide this.  D/c plan changed to SNF.  Revisions to Treatment Plan:  Change in dc plan.    Continued Need for Acute Rehabilitation Level of Care: The patient requires daily medical management by a physician with specialized training in physical medicine and rehabilitation for the following conditions: Daily direction of a multidisciplinary physical rehabilitation program to ensure safe treatment while eliciting the highest outcome that is of practical value to the patient.: Yes Daily medical management of patient stability for increased activity during participation in an intensive rehabilitation regime.: Yes Daily analysis of laboratory values and/or radiology reports with any subsequent need for medication adjustment of medical intervention for : Neurological problems;Post surgical problems;Other;Nutritional problems  Hannah Jefferson 04/20/2018, 12:33 PM

## 2018-04-20 NOTE — Progress Notes (Signed)
Physical Therapy Session Note  Patient Details  Name: Hannah Jefferson MRN: [id number] Date of Birth: [birth year]  Today's Date: 04/20/2018 PT Individual Time: 1100-1140 PT Individual Time Calculation (min): 40 min   Short Term Goals: Week 4:  PT Short Term Goal 1 (Week 4): =LTGs due to ELOS  Skilled Therapeutic Interventions/Progress Updates:    no c/o pain but does report some fatigue.  Session focus on activity tolerance with stand/pivot transfers, w/c mobility, and gait.  Seated rest breaks provided throughout for fatigue.   Pt transitions to EOB with min assist and mod verbal cues for supine>side lying>sit.  Stand/pivot to w/c on R with platform RW, pt requires max assist to stand from lower EOB, and min assist to step/pivot to w/c.  Pt propels w/c to therapy gym with BUEs and increased time with min/mod verbal cues for effective propulsion technique to prevent veering L.  Sit<>stand from w/c with mod assist and verbal cues for power up.  PT applied ankle weights to pt's LEs for improved proprioception with gait (2# on RLE and 1.5# on LLE).  Gait training x10' with platform RW and overall min assist, but pt reporting after 10' feeling nauseated/dizzy.  Returned to sitting and pt became very pale, nauseated, and dry heaving.  Returned to room, used stedy to transfer back to bed with min assist to stand from w/c.  Positioned in bed, with RN present.    Therapy Documentation Precautions:  Precautions Precautions: Fall, Back Precaution Comments: LUE NWB in splint Restrictions Weight Bearing Restrictions: Yes LUE Weight Bearing: Weight bear through elbow only(as tolerated) RLE Weight Bearing: Weight bearing as tolerated General: PT Amount of Missed Time (min): 20 Minutes PT Missed Treatment Reason: Patient ill (Comment)   See Function Navigator for Current Functional Status.   Therapy/Group: Individual Therapy  Michel Santee 04/20/2018, 12:51 PM

## 2018-04-20 NOTE — Progress Notes (Signed)
Nutrition Follow-up  DOCUMENTATION CODES:   Obesity unspecified  INTERVENTION:  Continue regular diet as tolerated.  Encourage adequate PO intake.   NUTRITION DIAGNOSIS:   Inadequate oral intake related to altered GI function as evidenced by (post op ileus, liquid diet); improved  GOAL:   Patient will meet greater than or equal to 90% of their needs; progressing  MONITOR:   PO intake, Weight trends, Skin, Labs, I & O's  REASON FOR ASSESSMENT:   Low [place]    ASSESSMENT:   62 year old female with history of hypertension, gait disorder with falls weakness and decreased sensation left lower extremity greater than right lower extremity since October 2018. Work-up revealed T7-T8 mass with marked cord compression and large enhancing tail emanating from dural based mass extending around the cord.  She did sustain a fall prior to her evaluation and required ORIF left wrist on 03/04/2018 by Dr. Caralyn Guile.   She was evaluated by [facility] and underwent T7-T8 tumor resection   Diet has been advanced to a regular diet. Meal completion has been 60-90%. Pt has been tolerating PO. RD to continue to monitor for tolerance. Pt encouraged to eat her food at meals. Labs and medications reviewed.    Diet Order:   Diet Order           Diet regular Room service appropriate? Yes; Fluid consistency: Thin  Diet effective now          EDUCATION NEEDS:   Not appropriate for education at this time  Skin:  Skin Assessment: Skin Integrity Issues: Skin Integrity Issues:: Incisions Incisions: back, neck  Last BM:  6/6  Height:   Ht Readings from Last 1 Encounters:  03/21/18 5\' 4"  (1.626 m)    Weight:   Wt Readings from Last 1 Encounters:  04/15/18 149 lb (67.6 kg)    Ideal Body Weight:  54.5 kg  BMI:  Body mass index is 25.58 kg/m.  Estimated Nutritional Needs:   Kcal:  1850-2000  Protein:  85-100 grams  Fluid:  1.8 - 2 L/day    Corrin Parker, MS, RD,  LDN Pager # [phone] After hours/ weekend pager # [phone]

## 2018-04-20 NOTE — Progress Notes (Signed)
Social Work Patient ID: Hannah Jefferson, female   DOB: 11-12-1956, 63 y.o.   MRN: [id number]  Have reviewed team conference with pt and sister-in-law.  Discussed plans for SNF and they are aware that team feels she is approaching readiness for transfer to SNF.  We discussed facility preferences and will begin bed search.  Nashika Coker, LCSW

## 2018-04-20 NOTE — Progress Notes (Signed)
Physical Therapy Session Note  Patient Details  Name: Hannah Jefferson MRN: [id number] Date of Birth: [birth year]  Today's Date: 04/20/2018 PT Individual Time: 1545-1630 PT Individual Time Calculation (min): 45 min   Short Term Goals: Week 4:  PT Short Term Goal 1 (Week 4): =LTGs due to ELOS  Skilled Therapeutic Interventions/Progress Updates:    Pt reported no c/o of pain prior to the start of her treatment session this afternoon. Pt reports she has been experiencing nausea all day and would prefer to participate in her treatment session while in bed. PT initiated supine LE strengthening exercises consisting of heels slides, abduction slides, and manually resisted hip flexion and extension exercises to Pt tolerance. Pt performed 2 sets/5 reps of heel slides requiring min A from PT for proper LE alignment with task to minimize hip external rotation compensation. PT initiated hip abduction heel slides with 3# wt for 2 sets/10 reps. PT educated Pt regarding the importance of quality over quantity in which Pt responded positively to. Lastly, PT provided manual resistance into hip flexion and extension for 1/10 reps bilaterally. Pt required frequent and sustained rest breaks in b/w sets of exercises due to increasing fatigue levels.   Therapy Documentation Precautions:  Precautions Precautions: Fall, Back Precaution Comments: LUE NWB in splint Restrictions Weight Bearing Restrictions: Yes LUE Weight Bearing: Weight bear through elbow only(as tolerated) RLE Weight Bearing: Weight bearing as tolerated   See Function Navigator for Current Functional Status.   Therapy/Group: Individual Therapy  Floreen Comber 04/20/2018, 4:47 PM

## 2018-04-21 ENCOUNTER — Inpatient Hospital Stay (HOSPITAL_COMMUNITY): Payer: 59 | Admitting: Physical Therapy

## 2018-04-21 ENCOUNTER — Inpatient Hospital Stay (HOSPITAL_COMMUNITY): Payer: 59

## 2018-04-21 LAB — BASIC METABOLIC PANEL
Anion gap: 9 (ref 5–15)
BUN: <5 mg/dL — ABNORMAL LOW (ref 6–20)
CO2: 26 mmol/L (ref 22–32)
Calcium: 8.9 mg/dL (ref 8.9–10.3)
Chloride: 96 mmol/L — ABNORMAL LOW (ref 101–111)
Creatinine, Ser: 0.71 mg/dL (ref 0.44–1.00)
GFR calc Af Amer: >60 mL/min (ref >60)
GFR calc non Af Amer: >60 mL/min (ref >60)
Glucose, Bld: 154 mg/dL — ABNORMAL HIGH (ref 65–99)
Potassium: 4.2 mmol/L (ref 3.5–5.1)
Sodium: 131 mmol/L — ABNORMAL LOW (ref 135–145)

## 2018-04-21 LAB — MAGNESIUM: Magnesium: 1.7 mg/dL (ref 1.7–2.4)

## 2018-04-21 MED ORDER — VITAMIN C 500 MG PO TABS
500.0000 mg | ORAL_TABLET | Freq: Two times a day (BID) | ORAL | Status: DC
  Administered 2018-04-21 – 2018-04-28 (×15): 500 mg via ORAL
  Filled 2018-04-21 (×14): qty 1

## 2018-04-21 MED ORDER — VITAMIN B-6 100 MG PO TABS
200.0000 mg | ORAL_TABLET | Freq: Every day | ORAL | Status: DC
  Administered 2018-04-21 – 2018-04-28 (×8): 200 mg via ORAL
  Filled 2018-04-21 (×8): qty 2

## 2018-04-21 MED ORDER — BISACODYL 10 MG RE SUPP
10.0000 mg | Freq: Every day | RECTAL | Status: DC
  Administered 2018-04-22 – 2018-04-28 (×7): 10 mg via RECTAL
  Filled 2018-04-21 (×7): qty 1

## 2018-04-21 NOTE — Progress Notes (Signed)
Physical Therapy Session Note  Patient Details  Name: Hannah Jefferson MRN: [id number] Date of Birth: May 24, 1956  Today's Date: 04/21/2018 PT Individual Time: 1100-1200 PT Time Calculation: 60 min   Short Term Goals: Week 4:  PT Short Term Goal 1 (Week 4): =LTGs due to ELOS  Skilled Therapeutic Interventions/Progress Updates:    no c/o pain.  Session focus on activity tolerance and NMR via gait training, static standing, and sit<>stands for toileting.    Pt transitions sit<>stand from w/c with mod assist throughout session with cues for hand placement, forward gaze, and forward weight shift.  Gait training x30' with platform RW and min/mod assist, cues for forward gaze, upright posture, and walker management.  W/C follow for safety during gait.  Transition to // bars, focus on knee control/hip abductor strength in static split stance with RLE propped on 2" step.  Pt able to tolerate 2 trials of about 30 seconds each, cues for keeping R knee bent, and reducing forward lean during seated rest break between trials.  Pt reporting feeling incontinent of BM, so transported back to room.  Sit<>stand with platform RW from w/c with mod assist, and pt able to tolerate static standing for total assist clothing management and hygiene with min assist for balance and cues for decreased forward lean.  On second 2 standing trials utilized stedy 2/2 pt fatigue.  Returned to w/c at end of session and positioned upright for lunch.  Call bell in reach and needs met.   Therapy Documentation Precautions:  Precautions Precautions: Fall, Back Precaution Comments: LUE NWB in splint Restrictions Weight Bearing Restrictions: Yes LUE Weight Bearing: Weight bear through elbow only(as tolerated) RLE Weight Bearing: Weight bearing as tolerated   See Function Navigator for Current Functional Status.   Therapy/Group: Individual Therapy  Michel Santee 04/21/2018, 4:45 PM

## 2018-04-21 NOTE — Progress Notes (Signed)
PHYSICAL MEDICINE & REHABILITATION     PROGRESS NOTE    Subjective/Complaints: Had a spell where she became weak, nauseas yesterday afternoon. Worked quite hard with therapy prior. Feels better this morning  ROS: Patient denies fever, rash, sore throat, blurred vision, nausea, vomiting, diarrhea, cough, shortness of breath or chest pain, joint or back pain, headache, or mood change.    Objective: Vital Signs: Blood pressure 116/68, pulse 87, temperature 98.2 F (36.8 C), temperature source Oral, resp. rate 18, height 5\' 4"  (1.626 m), weight 67.6 kg (149 lb), SpO2 97 %. No results found. No results for input(s): WBC, HGB, HCT, PLT in the last 72 hours. Recent Labs    04/21/18 0804  NA 131*  K 4.2  CL 96*  GLUCOSE 154*  BUN <5*  CREATININE 0.71  CALCIUM 8.9   CBG (last 3)  No results for input(s): GLUCAP in the last 72 hours.  Wt Readings from Last 3 Encounters:  04/15/18 67.6 kg (149 lb)  03/20/18 93.4 kg (206 lb)  02/15/18 94.1 kg (207 lb 8 oz)    Physical Exam:  Constitutional: No distress . Vital signs reviewed. HEENT: EOMI, oral membranes moist Neck: supple Cardiovascular: RRR without murmur. No JVD    Respiratory: CTA Bilaterally without wheezes or rales. Normal effort    GI: BS +, non-tender, non-distended  Neurological: Motor B/l UE 5/5.  B/l LE 3+-4-/5 HF, KE and 3+/5 ADF. --stable exam. Sensory decr 1/2 below level of injury to LT/proprioception persistently impaired Skin:warm Psychiatric:  pleasant  Assessment/Plan: 1. Paraplegia and functional deficits secondary to T8 myelopathy, left wrist fracture which require 3+ hours per day of interdisciplinary therapy in a comprehensive inpatient rehab setting. Physiatrist is providing close team supervision and 24 hour management of active medical problems listed below. Physiatrist and rehab team continue to assess barriers to discharge/monitor patient progress toward functional and medical  goals.  Function:  Bathing Bathing position Bathing activity did not occur: N/A Position: Wheelchair/chair at sink  Bathing parts Body parts bathed by patient: Right arm, Left arm, Chest, Abdomen, Front perineal area, Right upper leg, Left upper leg Body parts bathed by helper: Buttocks, Right lower leg, Left lower leg  Bathing assist Assist Level: Assistive device, 2 helpers Assistive Device Comment: reacher    Upper Body Dressing/Undressing Upper body dressing Upper body dressing/undressing activity did not occur: N/A What is the patient wearing?: Pull over shirt/dress Bra - Perfomed by patient: Thread/unthread right bra strap, Thread/unthread left bra strap Bra - Perfomed by helper: Hook/unhook bra (pull down sports bra) Pull over shirt/dress - Perfomed by patient: Thread/unthread right sleeve, Thread/unthread left sleeve, Put head through opening, Pull shirt over trunk Pull over shirt/dress - Perfomed by helper: Put head through opening Button up shirt - Perfomed by patient: Thread/unthread right sleeve, Thread/unthread left sleeve, Button/unbutton shirt Button up shirt - Perfomed by helper: Pull shirt around back    Upper body assist Assist Level: Supervision or verbal cues      Lower Body Dressing/Undressing Lower body dressing Lower body dressing/undressing activity did not occur: N/A What is the patient wearing?: Pants, Socks, Shoes, AFO     Pants- Performed by patient: Thread/unthread left pants leg, Thread/unthread right pants leg Pants- Performed by helper: Pull pants up/down   Non-skid slipper socks- Performed by helper: Don/doff right sock, Don/doff left sock Socks - Performed by patient: Don/doff right sock, Don/doff left sock Socks - Performed by helper: Don/doff right sock, Don/doff left sock   Shoes -  Performed by helper: Don/doff right shoe, Don/doff left shoe, Fasten right, Fasten left   AFO - Performed by helper: Don/doff left AFO      Lower body assist  Assist for lower body dressing: 2 Helpers      Toileting Toileting Toileting activity did not occur: Safety/medical concerns Toileting steps completed by patient: Performs perineal hygiene Toileting steps completed by helper: Adjust clothing prior to toileting, Adjust clothing after toileting Toileting Assistive Devices: Other (comment)(stedy)  Toileting assist Assist level: Two helpers   Transfers Chair/bed transfer   Chair/bed transfer method: Stand pivot Chair/bed transfer assist level: Moderate assist (Pt 50 - 74%/lift or lower) Chair/bed transfer assistive device: Armrests, Walker, Orthosis Mechanical lift: Stedy   Locomotion Ambulation Ambulation activity did not occur: Safety/medical concerns   Max distance: 20' Assist level: 2 helpers(mod A for gait, +2 for w/c follow)   Wheelchair   Type: Manual Max wheelchair distance: 150' Assist Level: Supervision or verbal cues  Cognition Comprehension Comprehension assist level: Follows complex conversation/direction with no assist  Expression Expression assist level: Expresses complex ideas: With no assist  Social Interaction Social Interaction assist level: Interacts appropriately with others - No medications needed.  Problem Solving Problem solving assist level: Solves complex problems: With extra time  Memory Memory assist level: More than reasonable amount of time   Medical Problem List and Plan: 1.Paraplegia and funtional deficitssecondary to T8 meningioma/resection with myeopathy -recent left wrist fracture, now in left wrist splint -Continue CIR  -SNF likely 2. 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  3. Pain Management:Hydrocodone prn 4. Mood:LCSW to follow for evaluation and support. 5. Neuropsych: This patientiscapable of making decisions on herown  behalf. 6. Skin/Wound Care:Routine pressure relief measures. 7. Fluids/Electrolytes/Nutrition:  -I personally reviewed the patient's labs today.      -potassium 4.2 today. Will decrease supp to 66meq bid  -mg 1.8=7  6/7, cont mg supplement  400mg  TID  --reg diet 8. Post op ileus/neurogenic bowel:    -having regular soft to loose stools  -dc reglan   -work on AM bowel program  -begin scheduled dulcolax supp tomorrow 9.Neurogenic bladder: foley out bladder incontinence  10. GERD:managed withPPIbid. 11. Hyponatremia:  Reslved   -hctz stopped  Na+ 131 on 6/4 and again today 6/7   Cont to monitor 12.Leftwrist fracture: NWB. [place] 13. Dyspepsia  -protonix  -prn maalox 14. Acute lower UTI:  -E Coli  -Keflex changed to fosfomycin x 2 doses 15. Acute blood loss anemia  Hemoglobin 10.9 6/3           LOS (Days) [place] EVALUATION WAS PERFORMED  Meredith Staggers, MD 04/21/2018 10:55 AM

## 2018-04-21 NOTE — Progress Notes (Signed)
Occupational Therapy Session Note  Patient Details  Name: Hannah Jefferson MRN: [id number] Date of Birth: 1956/09/15  Today's Date: 04/21/2018 OT Individual Time: 1445-1600 OT Individual Time Calculation (min): 75 min   Skilled Therapeutic Interventions/Progress Updates:    1:1. Pt requesting to complete toileting d/t needing urine sample. Pt completes supine>sitting EOB with min A for RLE management. Pt completes stand pivot transfers EOB<>w/c<>EOM with MOD A and VC for anterior weight shift as well as reaching back for controlled decent. Pt completes toileting with standing at walker with total A for clothing management. Pt able to complete hygiene seated with supervision. In tx gym, pt stands with platform walker with MIN A and VC for nose over toes while playing connect 4 game to facilitate 1UE support on walker in prep for clothing management tasks. Pt propels w/c to dayroom with supervision and remains seated to play wii bowling task with supervision. Pt requires min instructional cues for use of remote and question cueing to problem solve directing throw of ball at correct angles. Exited session with pt seated in w/c, call light in reach and family in room  Therapy Documentation Precautions:  Precautions Precautions: Fall, Back Precaution Comments: LUE NWB in splint Restrictions Weight Bearing Restrictions: Yes LUE Weight Bearing: Weight bear through elbow only(as tolerated) RLE Weight Bearing: Weight bearing as tolerated General:   Vital Signs: Therapy Vitals Temp: 98.7 F (37.1 C) Temp Source: Oral Pulse Rate: (!) 105 Resp: 18 BP: 102/70 Patient Position (if appropriate): Lying Oxygen Therapy SpO2: 99 % O2 Device: Room Air Pain: Pain Assessment Pain Scale: 0-10 Pain Score: 0-No pain Pain Type: Acute pain Pain Location: Back Pain Orientation: Mid;Lower;Medial Pain Descriptors / Indicators: Aching;Cramping Pain Frequency: Intermittent Pain Onset: On-going Patients  Stated Pain Goal: 4 Pain Intervention(s): Medication (See eMAR) \ See Function Navigator for Current Functional Status.   Therapy/Group: Individual Therapy  [name] 04/21/2018, 4:08 PM

## 2018-04-21 NOTE — Progress Notes (Signed)
Occupational Therapy Session Note  Patient Details  Name: Hannah Jefferson MRN: [id number] Date of Birth: August 15, 1956  Today's Date: 04/21/2018 OT Individual Time: 0915-1010 OT Individual Time Calculation (min): 55 min    Short Term Goals: Week 1:  OT Short Term Goal 1 (Week 1): Pt will be able to sit to stand with max A of 1 to prepare for LB self care. OT Short Term Goal 1 - Progress (Week 1): Progressing toward goal OT Short Term Goal 2 (Week 1): Pt will be able to squat pivot to w/c with max A of 1.  OT Short Term Goal 2 - Progress (Week 1): Progressing toward goal OT Short Term Goal 3 (Week 1): Pt will don shirt with min A. OT Short Term Goal 3 - Progress (Week 1): Met OT Short Term Goal 4 (Week 1): Pt will use AE to don pants over feet with mod A. OT Short Term Goal 4 - Progress (Week 1): Progressing toward goal  Skilled Therapeutic Interventions/Progress Updates:    1:1. Pt agreeable to bathing and dressing with no c/o pain. Pt rolls B to change brief and wash peri area and OT washes buttocks. Pt supine to sitting EOB with supervision and cueing for LE managmnet. Pt bathes LE with LHSS seated EOB. Pt uses reacher to don pants over feet and sit to stand with MIN A for clothing management with OT advancing pants past hips and stabilizing R knee while Pt uses platform walker. OT dons socks and shoes for time management prior to standing. Pt stand pivot transfer with MOD A and VC for RW management with VC for anterior weight shifting w/c posterior bias. Pt completes UB bathing/grooming/dressing at sink with supervision. Pt brushes hair after washing with no rise spray. OT dries hair with blow dryer and pt styles with brush saying, "whoo im tired." Exited session with pt seated in w/c with call light in reach, family in room and pt taking medications.   Therapy Documentation Precautions:  Precautions Precautions: Fall, Back Precaution Comments: LUE NWB in splint Restrictions Weight  Bearing Restrictions: Yes LUE Weight Bearing: Weight bear through elbow only(as tolerated) RLE Weight Bearing: Weight bearing as tolerated General:   Vital Signs: Therapy Vitals BP: 116/68 Pain:   no pain reported  See Function Navigator for Current Functional Status.   Therapy/Group: Individual Therapy  [name] 04/21/2018, 10:10 AM

## 2018-04-22 ENCOUNTER — Inpatient Hospital Stay (HOSPITAL_COMMUNITY): Payer: 59 | Admitting: Occupational Therapy

## 2018-04-22 ENCOUNTER — Inpatient Hospital Stay (HOSPITAL_COMMUNITY): Payer: 59

## 2018-04-22 MED ORDER — MAGNESIUM OXIDE 400 (241.3 MG) MG PO TABS
400.0000 mg | ORAL_TABLET | Freq: Two times a day (BID) | ORAL | Status: DC
  Administered 2018-04-22 – 2018-04-24 (×4): 400 mg via ORAL
  Filled 2018-04-22 (×4): qty 1

## 2018-04-22 MED ORDER — PANTOPRAZOLE SODIUM 40 MG PO TBEC
40.0000 mg | DELAYED_RELEASE_TABLET | Freq: Every day | ORAL | Status: DC
  Administered 2018-04-23 – 2018-04-28 (×6): 40 mg via ORAL
  Filled 2018-04-22 (×6): qty 1

## 2018-04-22 NOTE — Progress Notes (Signed)
Occupational Therapy Session Note  Patient Details  Name: Hannah Jefferson MRN: [id number] Date of Birth: [birth year]  Today's Date: 04/22/2018 OT Individual Time: 5583-1674 OT Individual Time Calculation (min): 60 min    Short Term Goals: Week 4:  OT Short Term Goal 1 (Week 4): Pt will be able to squat pivot to w/c with max A of 1.  OT Short Term Goal 1 - Progress (Week 4): Progressing toward goal OT Short Term Goal 2 (Week 4): Pt will pull pants over hips with max A for standing in Stedy OT Short Term Goal 2 - Progress (Week 4): Met OT Short Term Goal 3 (Week 4): Pt will don socks using AE with min A OT Short Term Goal 3 - Progress (Week 4): Met OT Short Term Goal 4 (Week 4): Pt will don shoes using AE PRN with min A OT Short Term Goal 4 - Progress (Week 4): Progressing toward goal  Skilled Therapeutic Interventions/Progress Updates: Patient and nursing tech were cleaning up and brief changing folllowing enema administered a couple hours this am.     This session patient was only to wash face and apply moisturizer and complete oral care (groomed with setup) before she continued to have runny stools.   She reported she had already washed body last night and was able to don blouse over head with min A and extra time.   The rest of the session was spent cleaning up incessant runny BMs, resulting from the enema.  At end of session, patient call bell and phone were placed within patient reached.   She requested to stay in bed and not don pants due to incessant, runny bms.     Therapy Documentation Precautions:  Precautions Precautions: Fall, Back Precaution Comments: LUE NWB in splint Restrictions Weight Bearing Restrictions: Yes LUE Weight Bearing: Weight bearing as tolerated RLE Weight Bearing: Weight bearing as tolerated   See Function Navigator for Current Functional Status.   Therapy/Group: Individual Therapy  Alfredia Ferguson [facility] 04/22/2018, 12:04 PM

## 2018-04-22 NOTE — Progress Notes (Signed)
Occupational Therapy Weekly Progress Note  Patient Details  Name: Hannah Jefferson MRN: [id number] Date of Birth: 11/18/1955  Beginning of progress report period: Apr 13, 2018 End of progress report period: April 20, 2018  Patient has met 2 of 4 short term goals.  Pt is making steady but slow progress with BADLs.  Pt completes supine>sit EOB with min A and occasional supervision.  Pt maintains sitting balance EOB with supervision and performs reciprocal scoots at supervision level to prepare for sit<>stand with Stedy.  Pt requires max A for toileting tasks. Pt uses reacher to thread BLE into pants but requires assistance to pull over hips.  Pt continues to required use of Stedy for transfers but is able to perform sit<>stand with RW at mod A and min A with Stedy.   Patient continues to demonstrate the following deficits: muscle weakness, decreased cardiorespiratoy endurance, impaired timing and sequencing, unbalanced muscle activation, decreased coordination and decreased motor planning and decreased sitting balance, decreased standing balance, decreased postural control and decreased balance strategies and therefore will continue to benefit from skilled OT intervention to enhance overall performance with BADL and iADL.  Patient progressing toward long term goals..  Continue plan of care.  OT Short Term Goals Week 4:  OT Short Term Goal 1 (Week 4): Pt will be able to squat pivot to w/c with max A of 1.  OT Short Term Goal 1 - Progress (Week 4): Progressing toward goal OT Short Term Goal 2 (Week 4): Pt will pull pants over hips with max A for standing in Stedy OT Short Term Goal 2 - Progress (Week 4): Met OT Short Term Goal 3 (Week 4): Pt will don socks using AE with min A OT Short Term Goal 3 - Progress (Week 4): Met OT Short Term Goal 4 (Week 4): Pt will don shoes using AE PRN with min A OT Short Term Goal 4 - Progress (Week 4): Progressing toward goal Week 5:  OT Short Term Goal 1 (Week 5):  STG=LTG secondary to ELOS   Therapy Documentation Precautions:  Precautions Precautions: Fall, Back Precaution Comments: LUE NWB in splint Restrictions Weight Bearing Restrictions: Yes LUE Weight Bearing: Weight bearing as tolerated RLE Weight Bearing: Weight bearing as tolerated General:   Vital Signs: Therapy Vitals Temp: 98.5 F (36.9 C) Temp Source: Oral Pulse Rate: 89 Resp: 20 BP: 96/63 Patient Position (if appropriate): Lying Oxygen Therapy SpO2: 98 % O2 Device: Room Air Pain: Pain Assessment Pain Score: Asleep  See Function Navigator for Current Functional Status.   Therapy/Group: Individual Therapy   M  04/22/2018, 6:45 AM   

## 2018-04-22 NOTE — Progress Notes (Signed)
Occupational Therapy Session Note  Patient Details  Name: Hannah Jefferson MRN: [id number] Date of Birth: [birth year]  Today's Date: 04/22/2018 OT Individual Time: 1415-1445 OT Individual Time Calculation (min): 30 min    Skilled Therapeutic Interventions/Progress Updates:    1:1. Pt agreeable to pt donning pants EOB. Pt completes supine>sitting EOB with supervision. Pt threads BLE into pants with reaching and stands with min A while OT advances pants past hips with platform walker. Pt completes stand pivot transfer with RW with MOD A overall for balance. Pt requires cueing to not back up too far to chair as chair will tilt backwards. Pt with small bowel movement upon standing. OT  Cleanses buttocks/changes brief with pt standing in stedy with supervision. Exited session with pt seated in w/c call light inr each and all needs met  Therapy Documentation Precautions:  Precautions Precautions: Fall, Back Precaution Comments: LUE NWB in splint Restrictions Weight Bearing Restrictions: Yes LUE Weight Bearing: Weight bearing as tolerated RLE Weight Bearing: Weight bearing as tolerated  See Function Navigator for Current Functional Status.   Therapy/Group: Individual Therapy  [name] 04/22/2018, 5:08 PM

## 2018-04-22 NOTE — Progress Notes (Signed)
[place] PHYSICAL MEDICINE & REHABILITATION     PROGRESS NOTE    Subjective/Complaints: Overall no new complaints.  Bowel movements have slowed.  States that she has her appetite and decreased taste for most foods  ROS: Patient denies fever, rash, sore throat, blurred vision, nausea, vomiting, diarrhea, cough, shortness of breath or chest pain, joint or back pain, headache, or mood change.   Objective: Vital Signs: Blood pressure 96/63, pulse 89, temperature 98.5 F (36.9 C), temperature source Oral, resp. rate 20, height 5\' 4"  (1.626 m), weight 67.6 kg (149 lb), SpO2 98 %. No results found. No results for input(s): WBC, HGB, HCT, PLT in the last 72 hours. Recent Labs    04/21/18 0804  NA 131*  K 4.2  CL 96*  GLUCOSE 154*  BUN <5*  CREATININE 0.71  CALCIUM 8.9   CBG (last 3)  No results for input(s): GLUCAP in the last 72 hours.  Wt Readings from Last 3 Encounters:  04/15/18 67.6 kg (149 lb)  03/20/18 93.4 kg (206 lb)  02/15/18 94.1 kg (207 lb 8 oz)    Physical Exam:  Constitutional: No distress . Vital signs reviewed. HEENT: EOMI, oral membranes moist Neck: supple Cardiovascular: RRR without murmur. No JVD    Respiratory: CTA Bilaterally without wheezes or rales. Normal effort    GI: BS +, non-tender, non-distended  Neurological: Motor B/l UE 5/5.  B/l LE 3+-4-/5 HF, KE and 3+/5 ADF. --stable exam. Sensory decr 1/2 below level of injury to LT/proprioception persistently impaired Skin:warm Psychiatric:  pleasant  Assessment/Plan: 1. Paraplegia and functional deficits secondary to T8 myelopathy, left wrist fracture which require 3+ hours per day of interdisciplinary therapy in a comprehensive inpatient rehab setting. Physiatrist is providing close team supervision and 24 hour management of active medical problems listed below. Physiatrist and rehab team continue to assess barriers to discharge/monitor patient progress toward functional and medical  goals.  Function:  Bathing Bathing position Bathing activity did not occur: N/A Position: Wheelchair/chair at sink  Bathing parts Body parts bathed by patient: Right arm, Left arm, Chest, Abdomen, Front perineal area, Right upper leg, Left upper leg Body parts bathed by helper: Buttocks, Right lower leg, Left lower leg  Bathing assist Assist Level: Assistive device, 2 helpers Assistive Device Comment: reacher    Upper Body Dressing/Undressing Upper body dressing Upper body dressing/undressing activity did not occur: N/A What is the patient wearing?: Pull over shirt/dress Bra - Perfomed by patient: Thread/unthread right bra strap, Thread/unthread left bra strap Bra - Perfomed by helper: Hook/unhook bra (pull down sports bra) Pull over shirt/dress - Perfomed by patient: Thread/unthread right sleeve, Thread/unthread left sleeve, Put head through opening, Pull shirt over trunk Pull over shirt/dress - Perfomed by helper: Put head through opening Button up shirt - Perfomed by patient: Thread/unthread right sleeve, Thread/unthread left sleeve, Button/unbutton shirt Button up shirt - Perfomed by helper: Pull shirt around back    Upper body assist Assist Level: Supervision or verbal cues      Lower Body Dressing/Undressing Lower body dressing Lower body dressing/undressing activity did not occur: N/A What is the patient wearing?: Pants, Socks, Shoes, AFO     Pants- Performed by patient: Thread/unthread left pants leg, Thread/unthread right pants leg Pants- Performed by helper: Pull pants up/down   Non-skid slipper socks- Performed by helper: Don/doff right sock, Don/doff left sock Socks - Performed by patient: Don/doff right sock, Don/doff left sock Socks - Performed by helper: Don/doff right sock, Don/doff left sock  Shoes - Performed by helper: Don/doff right shoe, Don/doff left shoe, Fasten right, Fasten left   AFO - Performed by helper: Don/doff left AFO      Lower body assist  Assist for lower body dressing: 2 Helpers      Toileting Toileting Toileting activity did not occur: Safety/medical concerns Toileting steps completed by patient: Performs perineal hygiene Toileting steps completed by helper: Adjust clothing prior to toileting, Adjust clothing after toileting Toileting Assistive Devices: Other (comment)(stedy/walker)  Toileting assist Assist level: Touching or steadying assistance (Pt.75%)   Transfers Chair/bed transfer   Chair/bed transfer method: Stand pivot Chair/bed transfer assist level: Moderate assist (Pt 50 - 74%/lift or lower) Chair/bed transfer assistive device: Armrests, Walker, Orthosis Mechanical lift: Stedy   Locomotion Ambulation Ambulation activity did not occur: Safety/medical concerns   Max distance: 20' Assist level: 2 helpers(mod A for gait, +2 for w/c follow)   Wheelchair   Type: Manual Max wheelchair distance: 150' Assist Level: Supervision or verbal cues  Cognition Comprehension Comprehension assist level: Follows complex conversation/direction with no assist  Expression Expression assist level: Expresses complex ideas: With no assist  Social Interaction Social Interaction assist level: Interacts appropriately with others - No medications needed.  Problem Solving Problem solving assist level: Solves complex problems: With extra time  Memory Memory assist level: More than reasonable amount of time   Medical Problem List and Plan: 1.Paraplegia and funtional deficitssecondary to T8 meningioma/resection with myeopathy -recent left wrist fracture, now in left wrist splint -Continue CIR  -SNF likely 2. 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  3. Pain Management:Hydrocodone prn 4. Mood:LCSW to follow for evaluation and support. 5. Neuropsych: This patientiscapable of  making decisions on herown behalf. 6. Skin/Wound Care:Routine pressure relief measures. 7. Fluids/Electrolytes/Nutrition:  -I personally reviewed the patient's labs today.      -potassium 4.2 on 6/7.  Recheck on Monday.  If normal will further decrease potassium supplement  -mg 1.7  6/7, cont mg supplement  400mg  TID  --reg diet 8. Post op ileus/neurogenic bowel:    -having regular soft to loose stools  -dc'ed reglan   -work on AM bowel program  - scheduled dulcolax supp every morning  -Do not see any obvious causes for change in taste.  Reducing supplements of medications as we can 9.Neurogenic bladder: foley out bladder incontinence  10. GERD:managed withPPIbid. 11. Hyponatremia:  Reslved   -hctz stopped  Na+ 131 on 6/4 and again today 6/7   Cont to monitor 12.Leftwrist fracture: NWB. [place] 13. Dyspepsia  -protonix  -prn maalox 14. Acute lower UTI:  -E Coli  -Keflex changed to fosfomycin x 2 doses 15. Acute blood loss anemia  Hemoglobin 10.9 6/3           LOS (Days) [place] EVALUATION WAS PERFORMED  Meredith Staggers, MD 04/22/2018 8:30 AM

## 2018-04-22 NOTE — Progress Notes (Signed)
Occupational Therapy Session Note  Patient Details  Name: Hannah Jefferson MRN: [id number] Date of Birth: 1956/04/19  Today's Date: 04/22/2018 OT Missed Time: 37 Minutes Missed Time Reason: Patient unwilling/refused to participate without medical reason(just had suppository and wanted to remain near bathroom)  Skilled Therapeutic Interventions/Progress Updates:    Pt declined participation in dance group today due to having AM suppository and requesting to remain near her bathroom. 60 minutes missed.   Therapy Documentation Precautions:  Precautions Precautions: Fall, Back Precaution Comments: LUE NWB in splint Restrictions Weight Bearing Restrictions: Yes LUE Weight Bearing: Weight bearing as tolerated RLE Weight Bearing: Weight bearing as tolerated General: General OT Amount of Missed Time: 60 Minutes Vital Signs: Therapy Vitals BP: 118/62 ADL: ADL ADL Comments: refer to functional navigator     See Function Navigator for Current Functional Status.   Therapy/Group: Group Therapy  Tycho Cheramie A Sahily Biddle 04/22/2018, 12:28 PM

## 2018-04-23 ENCOUNTER — Inpatient Hospital Stay (HOSPITAL_COMMUNITY): Payer: 59

## 2018-04-23 MED ORDER — SACCHAROMYCES BOULARDII 250 MG PO CAPS
250.0000 mg | ORAL_CAPSULE | Freq: Two times a day (BID) | ORAL | Status: DC
  Administered 2018-04-23 – 2018-04-28 (×11): 250 mg via ORAL
  Filled 2018-04-23 (×11): qty 1

## 2018-04-23 MED ORDER — CALCIUM POLYCARBOPHIL 625 MG PO TABS
625.0000 mg | ORAL_TABLET | Freq: Every day | ORAL | Status: DC
  Administered 2018-04-23 – 2018-04-28 (×6): 625 mg via ORAL
  Filled 2018-04-23 (×6): qty 1

## 2018-04-23 NOTE — Progress Notes (Signed)
Physical Therapy Session Note  Patient Details  Name: Hannah Jefferson MRN: [id number] Date of Birth: May 03, 1956  Today's Date: 04/23/2018 PT Individual Time: 1140-1215 PT Individual Time Calculation (min): 35 min   Short Term Goals: Week 1:  PT Short Term Goal 1 (Week 1): Pt will verbalize 2 methods of pressure relief in w/c with min cues PT Short Term Goal 1 - Progress (Week 1): Not met PT Short Term Goal 2 (Week 1): Pt will tolerate supported standing x1 minute for decreased burden of care with ADLs PT Short Term Goal 2 - Progress (Week 1): Not met PT Short Term Goal 3 (Week 1): Pt will initiate w/c propulsion for UE strengthening and cardiovascular endurance PT Short Term Goal 3 - Progress (Week 1): Met PT Short Term Goal 4 (Week 1): Pt will utilize LEs and RUE to assist with bed mobility in 100% of opportunities with min cues PT Short Term Goal 4 - Progress (Week 1): Met Week 2:  PT Short Term Goal 1 (Week 2): Pt will demonstrate 1 method of pressure relief in w/c with min cues. PT Short Term Goal 1 - Progress (Week 2): Partly met PT Short Term Goal 2 (Week 2): Pt will propel w/c 25' with supervision. PT Short Term Goal 2 - Progress (Week 2): Not met PT Short Term Goal 3 (Week 2): Pt will transition to standing with +1 assist in 25% of opportunities with LRAD.   PT Short Term Goal 3 - Progress (Week 2): Met PT Short Term Goal 4 (Week 2): Pt will tolerate supported standing x1 minute for decreased burden of care with ADLs PT Short Term Goal 4 - Progress (Week 2): Progressing toward goal PT Short Term Goal 5 (Week 2): Pt will self direct use of bed features for decreased burden of care with bed mobility.   PT Short Term Goal 5 - Progress (Week 2): Not met Week 3:  PT Short Term Goal 1 (Week 3): Pt will transfer in/out of w/c with +1 assist in 50% of observations.  PT Short Term Goal 1 - Progress (Week 3): Met PT Short Term Goal 2 (Week 3): Pt will initiate standard w/c propulsion for  increased UE strength and overall activity tolerance.  PT Short Term Goal 2 - Progress (Week 3): Met PT Short Term Goal 3 (Week 3): Pt will recall and demonstrate lateral leans in w/c to assist with pressure relief.  PT Short Term Goal 3 - Progress (Week 3): Met PT Short Term Goal 4 (Week 3): Pt will tolerate standing with BUE support x1 minute.  PT Short Term Goal 4 - Progress (Week 3): Progressing toward goal  Skilled Therapeutic Interventions/Progress Updates:   Pt supine in bed, and stated that she had had 4 BMs since 7:30 AM; PT agreed to bedside tx.  Pt asked to brush her teeth.  PT set her up for tooth brushing in the bed, reminding her not to flex at the neck when spitting in the basin.  Neuromuscular re-education via multimodal cues, visual feedback for R/L straight leg raises, hip abduction/knee flexion to slide LE off of bed-hip adduction/knee extension to bring LE back onto bed, L short arc quad knee extension, L ankle PF limited range to avoid over-supination.  PT provided manual therapy to increase bil ankle DF using joint mobilizations talocrural joints, grade II.   Sister in Designer, industrial/product arrived.  PT reviewed R/L heel cord stretching, clonus, and use of PRAFOs.  Pt reported that her heels  hurt after several hours wearing PRAFOs at night.  PT educated pt in directing staff to make sure foot and ankle are aligned when donning them.  [name] reported that pt has sprained her L ankle multiple times in the past, beginning when she was 62 y/o.   Pt use bed controls to move up toward [facility].  PT instructed [name] to help pt place bil LEs into hook lying position.  Pt left resting in bed with family present.     Therapy Documentation Precautions:  Precautions Precautions: Fall, Back Precaution Comments: LUE NWB in splint Restrictions Weight Bearing Restrictions: Yes LUE Weight Bearing: Weight bearing as tolerated RLE Weight Bearing: Weight bearing as tolerated   Pain: none per pt    See  Function Navigator for Current Functional Status.   Therapy/Group: Individual Therapy  Alexzander Dolinger 04/23/2018, 12:28 PM

## 2018-04-23 NOTE — Progress Notes (Signed)
Occupational Therapy Session Note  Patient Details  Name: Hannah Jefferson MRN: [id number] Date of Birth: 1956/01/29  Today's Date: 04/23/2018 OT Individual Time: 4081-4481 OT Individual Time Calculation (min): 33 min    Short Term Goals: Week 4:  OT Short Term Goal 1 (Week 4): Pt will be able to squat pivot to w/c with max A of 1.  OT Short Term Goal 1 - Progress (Week 4): Progressing toward goal OT Short Term Goal 2 (Week 4): Pt will pull pants over hips with max A for standing in Stedy OT Short Term Goal 2 - Progress (Week 4): Met OT Short Term Goal 3 (Week 4): Pt will don socks using AE with min A OT Short Term Goal 3 - Progress (Week 4): Met OT Short Term Goal 4 (Week 4): Pt will don shoes using AE PRN with min A OT Short Term Goal 4 - Progress (Week 4): Progressing toward goal  Skilled Therapeutic Interventions/Progress Updates:    1:1. Pt agreeable to complete transfer training with platform walker. Pt stand pivot transfer w/c<>EOM 2x with MOD A for all but 1 transfer back with VC for foot placement, terminal hip extension in standing and "nose over toes." Pt completes 3 frames of wii bowling in standing with min- MOD A for L lean. Pt proud to be able to complete activity in standing. Exited session with pt seated in w/c visiting with family.   Therapy Documentation Precautions:  Precautions Precautions: Fall, Back Precaution Comments: LUE NWB in splint Restrictions Weight Bearing Restrictions: Yes LUE Weight Bearing: Weight bearing as tolerated RLE Weight Bearing: Weight bearing as tolerated General:    See Function Navigator for Current Functional Status.   Therapy/Group: Individual Therapy  [name] 04/23/2018, 4:49 PM

## 2018-04-23 NOTE — Progress Notes (Signed)
[place] PHYSICAL MEDICINE & REHABILITATION     PROGRESS NOTE    Subjective/Complaints: Patient without new complaints.  Stools still are frequent.  Took suppository this morning.  Feels that there are moments when her legs move better than other  times  ROS: Patient denies fever, rash, sore throat, blurred vision, nausea, vomiting, diarrhea, cough, shortness of breath or chest pain, joint or back pain, headache, or mood change.   Objective: Vital Signs: Blood pressure 96/61, pulse 99, temperature 98.1 F (36.7 C), temperature source Oral, resp. rate 18, height 5\' 4"  (1.626 m), weight 67.6 kg (149 lb), SpO2 98 %. No results found. No results for input(s): WBC, HGB, HCT, PLT in the last 72 hours. Recent Labs    04/21/18 0804  NA 131*  K 4.2  CL 96*  GLUCOSE 154*  BUN <5*  CREATININE 0.71  CALCIUM 8.9   CBG (last 3)  No results for input(s): GLUCAP in the last 72 hours.  Wt Readings from Last 3 Encounters:  04/15/18 67.6 kg (149 lb)  03/20/18 93.4 kg (206 lb)  02/15/18 94.1 kg (207 lb 8 oz)    Physical Exam:  Constitutional: No distress . Vital signs reviewed. HEENT: EOMI, oral membranes moist Neck: supple Cardiovascular: RRR without murmur. No JVD    Respiratory: CTA Bilaterally without wheezes or rales. Normal effort    GI: BS +, non-tender, non-distended   Neurological: Motor B/l UE 5/5.  B/l LE 3+-4-/5 HF, KE and 3+/5 ADF. --stable exam. Sensory decr 1/2 below level of injury to LT/proprioception persistently impaired Skin:warm Psychiatric:  pleasant  Assessment/Plan: 1. Paraplegia and functional deficits secondary to T8 myelopathy, left wrist fracture which require 3+ hours per day of interdisciplinary therapy in a comprehensive inpatient rehab setting. Physiatrist is providing close team supervision and 24 hour management of active medical problems listed below. Physiatrist and rehab team continue to assess barriers to discharge/monitor patient progress  toward functional and medical goals.  Function:  Bathing Bathing position Bathing activity did not occur: N/A Position: Wheelchair/chair at sink  Bathing parts Body parts bathed by patient: Right arm, Left arm, Chest, Abdomen, Front perineal area, Right upper leg, Left upper leg Body parts bathed by helper: Buttocks, Right lower leg, Left lower leg  Bathing assist Assist Level: Assistive device, 2 helpers Assistive Device Comment: reacher    Upper Body Dressing/Undressing Upper body dressing Upper body dressing/undressing activity did not occur: N/A What is the patient wearing?: Pull over shirt/dress Bra - Perfomed by patient: Thread/unthread right bra strap, Thread/unthread left bra strap Bra - Perfomed by helper: Hook/unhook bra (pull down sports bra) Pull over shirt/dress - Perfomed by patient: Thread/unthread right sleeve, Thread/unthread left sleeve, Put head through opening, Pull shirt over trunk Pull over shirt/dress - Perfomed by helper: Put head through opening Button up shirt - Perfomed by patient: Thread/unthread right sleeve, Thread/unthread left sleeve, Button/unbutton shirt Button up shirt - Perfomed by helper: Pull shirt around back    Upper body assist Assist Level: Supervision or verbal cues      Lower Body Dressing/Undressing Lower body dressing Lower body dressing/undressing activity did not occur: N/A What is the patient wearing?: Pants, Socks, Shoes, AFO     Pants- Performed by patient: Thread/unthread left pants leg, Thread/unthread right pants leg Pants- Performed by helper: Pull pants up/down   Non-skid slipper socks- Performed by helper: Don/doff right sock, Don/doff left sock Socks - Performed by patient: Don/doff right sock, Don/doff left sock Socks - Performed by helper:  Don/doff right sock, Don/doff left sock   Shoes - Performed by helper: Don/doff right shoe, Don/doff left shoe, Fasten right, Fasten left   AFO - Performed by helper: Don/doff left  AFO      Lower body assist Assist for lower body dressing: 2 Helpers      Toileting Toileting Toileting activity did not occur: Safety/medical concerns Toileting steps completed by patient: Performs perineal hygiene Toileting steps completed by helper: Adjust clothing prior to toileting, Performs perineal hygiene, Adjust clothing after toileting Toileting Assistive Devices: Other (comment)(stedy/walker)  Toileting assist Assist level: Touching or steadying assistance (Pt.75%)   Transfers Chair/bed transfer   Chair/bed transfer method: Stand pivot Chair/bed transfer assist level: Moderate assist (Pt 50 - 74%/lift or lower) Chair/bed transfer assistive device: Armrests, Walker, Orthosis Mechanical lift: Stedy   Locomotion Ambulation Ambulation activity did not occur: Safety/medical concerns   Max distance: 20' Assist level: 2 helpers(mod A for gait, +2 for w/c follow)   Wheelchair   Type: Manual Max wheelchair distance: 150' Assist Level: Supervision or verbal cues  Cognition Comprehension Comprehension assist level: Follows complex conversation/direction with no assist  Expression Expression assist level: Expresses complex ideas: With no assist  Social Interaction Social Interaction assist level: Interacts appropriately with others - No medications needed.  Problem Solving Problem solving assist level: Solves complex problems: With extra time  Memory Memory assist level: Complete Independence: No helper   Medical Problem List and Plan: 1.Paraplegia and funtional deficitssecondary to T8 meningioma/resection with myeopathy -recent left wrist fracture, now in left wrist splint -Continue CIR  -SNF likely 2. 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  3. Pain Management:Hydrocodone prn 4. Mood:LCSW to follow for evaluation and  support. 5. Neuropsych: This patientiscapable of making decisions on herown behalf. 6. Skin/Wound Care:Routine pressure relief measures. 7. Fluids/Electrolytes/Nutrition:  -I personally reviewed the patient's labs today.      -potassium 4.2 on 6/7.  Recheck on Monday.  If normal will further decrease potassium supplement  -mg 1.7  6/7, cont mg supplement  400mg  TID  --reg diet 8. Post op ileus/neurogenic bowel:    -Working toward bowel program.  -She is now off of Reglan   -We will add probiotic and fiber to help bulk her up  - scheduled dulcolax supp every morning 9.Neurogenic bladder: foley out bladder incontinence  10. GERD:managed withPPIbid. 11. Hyponatremia:  Reslved   -hctz stopped  Na+ 131 on 6/4 and again  6/7   Cont to monitor 12.Leftwrist fracture: NWB. [place] 13. Dyspepsia  -protonix  -prn maalox 14. Acute lower UTI:  -E Coli  -Keflex changed to fosfomycin x 2 doses 15. Acute blood loss anemia  Hemoglobin 10.9 6/3           LOS (Days) [place] EVALUATION WAS PERFORMED  Meredith Staggers, MD 04/23/2018 8:13 AM

## 2018-04-24 ENCOUNTER — Inpatient Hospital Stay (HOSPITAL_COMMUNITY): Payer: 59

## 2018-04-24 ENCOUNTER — Inpatient Hospital Stay (HOSPITAL_COMMUNITY): Payer: 59 | Admitting: Physical Therapy

## 2018-04-24 ENCOUNTER — Inpatient Hospital Stay (HOSPITAL_COMMUNITY): Payer: 59 | Admitting: Occupational Therapy

## 2018-04-24 LAB — BASIC METABOLIC PANEL
Anion gap: 9 (ref 5–15)
BUN: 5 mg/dL — ABNORMAL LOW (ref 6–20)
CO2: 25 mmol/L (ref 22–32)
Calcium: 8.9 mg/dL (ref 8.9–10.3)
Chloride: 99 mmol/L — ABNORMAL LOW (ref 101–111)
Creatinine, Ser: 0.47 mg/dL (ref 0.44–1.00)
GFR calc Af Amer: >60 mL/min (ref >60)
GFR calc non Af Amer: >60 mL/min (ref >60)
Glucose, Bld: 105 mg/dL — ABNORMAL HIGH (ref 65–99)
Potassium: 4.3 mmol/L (ref 3.5–5.1)
Sodium: 133 mmol/L — ABNORMAL LOW (ref 135–145)

## 2018-04-24 LAB — CBC
HCT: 31.7 % — ABNORMAL LOW (ref 36.0–46.0)
Hemoglobin: 10.5 g/dL — ABNORMAL LOW (ref 12.0–15.0)
MCH: 29.6 pg (ref 26.0–34.0)
MCHC: 33.1 g/dL (ref 30.0–36.0)
MCV: 89.3 fL (ref 78.0–100.0)
Platelets: 202 10*3/uL (ref 150–400)
RBC: 3.55 MIL/uL — ABNORMAL LOW (ref 3.87–5.11)
RDW: 16.1 % — ABNORMAL HIGH (ref 11.5–15.5)
WBC: 6.2 10*3/uL (ref 4.0–10.5)

## 2018-04-24 LAB — MAGNESIUM: Magnesium: 1.9 mg/dL (ref 1.7–2.4)

## 2018-04-24 MED ORDER — POTASSIUM CHLORIDE CRYS ER 20 MEQ PO TBCR
20.0000 meq | EXTENDED_RELEASE_TABLET | Freq: Two times a day (BID) | ORAL | Status: DC
  Administered 2018-04-24 – 2018-04-26 (×4): 20 meq via ORAL
  Filled 2018-04-24 (×4): qty 1

## 2018-04-24 MED ORDER — MAGNESIUM OXIDE 400 (241.3 MG) MG PO TABS
400.0000 mg | ORAL_TABLET | Freq: Every day | ORAL | Status: DC
  Administered 2018-04-25 – 2018-04-26 (×2): 400 mg via ORAL
  Filled 2018-04-24 (×3): qty 1

## 2018-04-24 NOTE — Plan of Care (Signed)
  Problem: Consults Goal: RH SPINAL CORD INJURY PATIENT EDUCATION Description  See Patient Education module for education specifics.  Outcome: Progressing   Problem: SCI BOWEL ELIMINATION Goal: RH STG MANAGE BOWEL WITH ASSISTANCE Description STG Manage Bowel with mod Assistance.   Outcome: Progressing Goal: RH STG SCI MANAGE BOWEL PROGRAM W/ASSIST OR AS APPROPRIATE Description STG SCI Manage bowel program w/mod assist or as appropriate.   Outcome: Progressing   Problem: SCI BLADDER ELIMINATION Goal: RH STG MANAGE BLADDER WITH ASSISTANCE Description STG Manage Bladder With mod Assistance   Outcome: Progressing Goal: RH STG MANAGE BLADDER WITH EQUIPMENT WITH ASSISTANCE Description STG Manage Bladder With Equipment With mod Assistance   Outcome: Progressing   Problem: RH SKIN INTEGRITY Goal: RH STG SKIN FREE OF INFECTION/BREAKDOWN Description min   Outcome: Progressing   Problem: RH SAFETY Goal: RH STG ADHERE TO SAFETY PRECAUTIONS W/ASSISTANCE/DEVICE Description STG Adhere to Safety Precautions With min Assistance/Device.  Outcome: Progressing   Problem: RH PAIN MANAGEMENT Goal: RH STG PAIN MANAGED AT OR BELOW PT'S PAIN GOAL Description 2 or less  Outcome: Progressing

## 2018-04-24 NOTE — Progress Notes (Signed)
Physical Therapy Session Note  Patient Details  Name: Hannah Jefferson MRN: [id number] Date of Birth: October 19, 1956  Today's Date: 04/24/2018 PT Individual Time: 1000-1100 PT Individual Time Calculation (min): 60 min   Short Term Goals: Week 4:  PT Short Term Goal 1 (Week 4): =LTGs due to ELOS  Skilled Therapeutic Interventions/Progress Updates: Pt presented in bed agreeable to therapy. Performed supine to sit with use of bed features minA for BLE management as pt unable to move legs over bed rail. Performed sit to stand minA and stand pivot to w/c. Transported to [employer] and participated in gait training x 59f with close w/c follow. Pt participated in standing balance playing checkers at high/low table with PFRW. PTA performed tactile cues for maintaining TKE on RLE in standing. Able to performed x 2 trials approx 2-3 min ea. Pt returned to w/c and propelled to day room supervision. Participated in Cybex Kinetron 20 cycles at 60cm/sec, 40cm/sec, and 30cm/sec respectively. Pt propelled back to room and performed stand pivot transfer to bed with verbal cues for foot placement. Pt returned to bed and was able to bridge with PTA supporting feet to [facility] Pt remained in bed at end of session with call bell within reach and needs met.      Therapy Documentation Precautions:  Precautions Precautions: Fall, Back Precaution Comments: LUE NWB in splint Restrictions Weight Bearing Restrictions: Yes LUE Weight Bearing: Weight bearing as tolerated RLE Weight Bearing: Weight bearing as tolerated General:   Vital Signs:   Pain: Pain Assessment Pain Scale: 0-10 Pain Score: 0-No pain   See Function Navigator for Current Functional Status.   Therapy/Group: Individual Therapy  Siddh Vandeventer  Phillip Maffei, PTA  04/24/2018, 12:08 PM

## 2018-04-24 NOTE — Progress Notes (Signed)
Resting and easily aroused, states she "beginning to feel better'the Patient was observed to be more engaged in participating  in assisting staff with basic care and postioning during shift..Continue to encourage po intake, States she is aware that she maybe leaving this week and the plan is for her to continue additional therapy at another facility, then go home from there.In good spirits. Refer to assessment data collection for additional updates. Call bell within reach,, assisted

## 2018-04-24 NOTE — Progress Notes (Signed)
Occupational Therapy Session Note  Patient Details  Name: Hannah Jefferson MRN: [id number] Date of Birth: 07/14/1956  Today's Date: 04/24/2018 OT Individual Time: 1420-1530 OT Individual Time Calculation (min): 70 min    Short Term Goals: Week 5:  OT Short Term Goal 1 (Week 5): STG=LTG secondary to ELOS  Skilled Therapeutic Interventions/Progress Updates:    Pt greeted semi-reclined in bed stating she had incontinent episode. Brought pt to sitting EOB with min A. Assisted with donning shoes and air cast seated EOB. Attempted sit<>stand from EOB using platform RW. Pt unable to power up and began sliding forward off of bed requiring max A from OT to push back onto bed. Pt stated she felt like her legs were worn out from therapy today so Stedy used for sit<>stands with supervision. Pt able to maintain standing in stedy while performing peri-care and washing buttocks with CGA. After brief donned, worked on threading pant legs with reacher. Supervision sit<>stand in stedy, then CGA again for clothing management to pull pants up over hips. Stedy used to transfer pt to wc. Worked on community wc mobility and UB strengthening with wc propulsion.  OT educated pt on community safety awareness, and community access from wc level. Practiced backing wc in and out of elevators with OT providing verbal cues for direction. Pt propelled wc outside and took extended rest break. Pt returned to room and Stedy used to transfer pt back to bed with min guard A. Max A to lift B LEs back in the bed. Pt left semi-reclined with needs met.   Therapy Documentation Precautions:  Precautions Precautions: Fall, Back Precaution Comments: LUE NWB in splint Restrictions Weight Bearing Restrictions: Yes LUE Weight Bearing: Weight bearing as tolerated RLE Weight Bearing: Weight bearing as tolerated Pain:  none/denies pain ADL: ADL ADL Comments: refer to functional navigator  See Function Navigator for Current Functional  Status.   Therapy/Group: Individual Therapy   S  04/24/2018, 2:29 PM 

## 2018-04-24 NOTE — Progress Notes (Signed)
Occupational Therapy Session Note  Patient Details  Name: Hannah Jefferson MRN: [id number] Date of Birth: 27-Nov-1955  Today's Date: 04/24/2018 OT Individual Time: 4431-5400 OT Individual Time Calculation (min): 60 min    Short Term Goals: Week 5:  OT Short Term Goal 1 (Week 5): STG=LTG secondary to ELOS  Skilled Therapeutic Interventions/Progress Updates:    Pt resting in bed upon arrival.  OT intervention with focus on bed mobility, sitting balance, sit<>stand, functional transfers, standing balance, and BADL retraining to increase independence with BADLs.  Pt performed supine>sit EOB at supervision level and stood with PFRW with min A.  Pt performed stand pivot transfer to [facility] with min A.  Pt required assistance to doff brief before sitting.  Pt performed sit<>stand X 3 from [facility] to perform perineal hygiene and secure brief.  Pt attempted to transfer to w/c but was unable to shift weight and advance LLE after 3 attempts.  Pt transferred to w/c with Stedy.  Pt threaded BLE using reacher and performed sit<>stand with min A.  Pt required tot A for pulling pants over hips.  Pt completed grooming tasks at sink.  Pt stated she was not feeling "well" and requested to return to bed to await next therapy.  Pt transferred to bed with Stedy.  Pt remained in bed with all needs within reach.   Therapy Documentation Precautions:  Precautions Precautions: Fall, Back Precaution Comments: LUE NWB in splint Restrictions Weight Bearing Restrictions: Yes LUE Weight Bearing: Weight bearing as tolerated RLE Weight Bearing: Weight bearing as tolerated   Pain: Pain Assessment Pain Scale: 0-10 Pain Score: 0-No pain  See Function Navigator for Current Functional Status.   Therapy/Group: Individual Therapy  [name] 04/24/2018, 10:59 AM

## 2018-04-24 NOTE — Progress Notes (Signed)
Hannah Jefferson PHYSICAL MEDICINE & REHABILITATION     PROGRESS NOTE    Subjective/Complaints: Had a formed BM this morning. No new problems. Asked if she was still leaving tomorrow  ROS: Patient denies fever, rash, sore throat, blurred vision, nausea, vomiting, diarrhea, cough, shortness of breath or chest pain, joint or back pain, headache, or mood change.    Objective: Vital Signs: Blood pressure 120/78, pulse 96, temperature 97.9 F (36.6 C), temperature source Oral, resp. rate 18, height 5\' 4"  (1.626 m), weight 67.6 kg (149 lb), SpO2 97 %. No results found. Recent Labs    04/24/18 0550  WBC 6.2  HGB 10.5*  HCT 31.7*  PLT 202   Recent Labs    04/24/18 0550  NA 133*  K 4.3  CL 99*  GLUCOSE 105*  BUN 5*  CREATININE 0.47  CALCIUM 8.9   CBG (last 3)  No results for input(s): GLUCAP in the last 72 hours.  Wt Readings from Last 3 Encounters:  04/15/18 67.6 kg (149 lb)  03/20/18 93.4 kg (206 lb)  02/15/18 94.1 kg (207 lb 8 oz)    Physical Exam:  Constitutional: No distress . Vital signs reviewed. HEENT: EOMI, oral membranes moist Neck: supple Cardiovascular: RRR without murmur. No JVD    Respiratory: CTA Bilaterally without wheezes or rales. Normal effort    GI: BS +, non-tender, non-distended  Neurological: Motor B/l UE 5/5.  B/l LE 3+-4-/5 HF, KE and 3+/5 ADF. --stable motor exam. Sensory decr 1/2 below level of injury to LT/proprioception no change Skin:warm Psychiatric:  Remains pleasant  Assessment/Plan: 1. Paraplegia and functional deficits secondary to T8 myelopathy, left wrist fracture which require 3+ hours per day of interdisciplinary therapy in a comprehensive inpatient rehab setting. Physiatrist is providing close team supervision and 24 hour management of active medical problems listed below. Physiatrist and rehab team continue to assess barriers to discharge/monitor patient progress toward functional and medical  goals.  Function:  Bathing Bathing position Bathing activity did not occur: N/A Position: Wheelchair/chair at sink  Bathing parts Body parts bathed by patient: Right arm, Left arm, Chest, Abdomen, Front perineal area, Right upper leg, Left upper leg Body parts bathed by helper: Buttocks, Right lower leg, Left lower leg  Bathing assist Assist Level: Assistive device, 2 helpers Assistive Device Comment: reacher    Upper Body Dressing/Undressing Upper body dressing Upper body dressing/undressing activity did not occur: N/A What is the patient wearing?: Pull over shirt/dress Bra - Perfomed by patient: Thread/unthread right bra strap, Thread/unthread left bra strap Bra - Perfomed by helper: Hook/unhook bra (pull down sports bra) Pull over shirt/dress - Perfomed by patient: Thread/unthread right sleeve, Thread/unthread left sleeve, Put head through opening, Pull shirt over trunk Pull over shirt/dress - Perfomed by helper: Put head through opening Button up shirt - Perfomed by patient: Thread/unthread right sleeve, Thread/unthread left sleeve, Button/unbutton shirt Button up shirt - Perfomed by helper: Pull shirt around back    Upper body assist Assist Level: Supervision or verbal cues      Lower Body Dressing/Undressing Lower body dressing Lower body dressing/undressing activity did not occur: N/A What is the patient wearing?: Pants, Socks, Shoes, AFO     Pants- Performed by patient: Thread/unthread left pants leg, Thread/unthread right pants leg Pants- Performed by helper: Pull pants up/down   Non-skid slipper socks- Performed by helper: Don/doff right sock, Don/doff left sock Socks - Performed by patient: Don/doff right sock, Don/doff left sock Socks - Performed by helper: Don/doff right sock,  Don/doff left sock   Shoes - Performed by helper: Don/doff right shoe, Don/doff left shoe, Fasten right, Fasten left   AFO - Performed by helper: Don/doff left AFO      Lower body assist  Assist for lower body dressing: 2 Helpers      Toileting Toileting Toileting activity did not occur: Safety/medical concerns Toileting steps completed by patient: Performs perineal hygiene Toileting steps completed by helper: Adjust clothing prior to toileting, Performs perineal hygiene, Adjust clothing after toileting Toileting Assistive Devices: Other (comment)(stedy/walker)  Toileting assist Assist level: Touching or steadying assistance (Pt.75%)   Transfers Chair/bed transfer   Chair/bed transfer method: Stand pivot Chair/bed transfer assist level: Moderate assist (Pt 50 - 74%/lift or lower) Chair/bed transfer assistive device: Armrests, Walker, Orthosis Mechanical lift: Stedy   Locomotion Ambulation Ambulation activity did not occur: Safety/medical concerns   Max distance: 20' Assist level: 2 helpers(mod A for gait, +2 for w/c follow)   Wheelchair   Type: Manual Max wheelchair distance: 150' Assist Level: Supervision or verbal cues  Cognition Comprehension Comprehension assist level: Follows complex conversation/direction with no assist  Expression Expression assist level: Expresses complex ideas: With no assist  Social Interaction Social Interaction assist level: Interacts appropriately with others - No medications needed.  Problem Solving Problem solving assist level: Solves complex problems: With extra time  Memory Memory assist level: Complete Independence: No helper   Medical Problem List and Plan: 1.Paraplegia and funtional deficitssecondary to T8 meningioma/resection with myeopathy -recent left wrist fracture, now in left wrist splint -Continue CIR  -SNF this week. Should be medically stable to go over the next few days.  2. 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  3. Pain Management:Hydrocodone prn 4.  Mood:LCSW to follow for evaluation and support. 5. Neuropsych: This patientiscapable of making decisions on herown behalf. 6. Skin/Wound Care:Routine pressure relief measures. 7. Fluids/Electrolytes/Nutrition:  -I personally reviewed the patient's labs today.      -potassium 4.3 today 6/10.     -wean supplement  -mg up to 1.9, decr supp to BID  --reg diet 8. Post op ileus/neurogenic bowel:    Had bm this morning with suppository  -continue probiotic and fiber to help bulk her up  -continued scheduled dulcolax supp every morning 9.Neurogenic bladder: foley out bladder incontinence  10. GERD:managed withPPIbid. 11. Hyponatremia:  Reslved   -hctz stopped  Na+ 133 today   Cont to monitor 12.Leftwrist fracture: NWB. [place] 13. Dyspepsia  -protonix  -prn maalox 14. Acute lower UTI:  -E Coli  -Keflex changed to fosfomycin x 2 doses 15. Acute blood loss anemia  Hemoglobin 10.5 610           LOS (Days) [place] EVALUATION WAS PERFORMED  Meredith Staggers, MD 04/24/2018 10:36 AM

## 2018-04-25 ENCOUNTER — Inpatient Hospital Stay (HOSPITAL_COMMUNITY): Payer: 59

## 2018-04-25 ENCOUNTER — Inpatient Hospital Stay (HOSPITAL_COMMUNITY): Payer: 59 | Admitting: Physical Therapy

## 2018-04-25 LAB — BASIC METABOLIC PANEL
Anion gap: 11 (ref 5–15)
BUN: 6 mg/dL (ref 6–20)
CO2: 19 mmol/L — ABNORMAL LOW (ref 22–32)
Calcium: 8.6 mg/dL — ABNORMAL LOW (ref 8.9–10.3)
Chloride: 99 mmol/L — ABNORMAL LOW (ref 101–111)
Creatinine, Ser: 0.44 mg/dL (ref 0.44–1.00)
GFR calc Af Amer: >60 mL/min (ref >60)
GFR calc non Af Amer: >60 mL/min (ref >60)
Glucose, Bld: 121 mg/dL — ABNORMAL HIGH (ref 65–99)
Potassium: 3.9 mmol/L (ref 3.5–5.1)
Sodium: 129 mmol/L — ABNORMAL LOW (ref 135–145)

## 2018-04-25 LAB — CBC
HCT: 33.6 % — ABNORMAL LOW (ref 36.0–46.0)
Hemoglobin: 10.8 g/dL — ABNORMAL LOW (ref 12.0–15.0)
MCH: 29.3 pg (ref 26.0–34.0)
MCHC: 32.1 g/dL (ref 30.0–36.0)
MCV: 91.3 fL (ref 78.0–100.0)
Platelets: 208 10*3/uL (ref 150–400)
RBC: 3.68 MIL/uL — ABNORMAL LOW (ref 3.87–5.11)
RDW: 16.2 % — ABNORMAL HIGH (ref 11.5–15.5)
WBC: 8 10*3/uL (ref 4.0–10.5)

## 2018-04-25 MED ORDER — SENNA 8.6 MG PO TABS
1.0000 | ORAL_TABLET | Freq: Every day | ORAL | Status: DC
  Administered 2018-04-27: 8.6 mg via ORAL
  Filled 2018-04-25: qty 1

## 2018-04-25 MED ORDER — METHOCARBAMOL 500 MG PO TABS: 500.0000 mg | ORAL_TABLET | Freq: Four times a day (QID) | ORAL | Status: DC | PRN

## 2018-04-25 NOTE — Progress Notes (Signed)
Occupational Therapy Session Note  Patient Details  Name: Hannah Jefferson MRN: [id number] Date of Birth: May 07, 1956  Today's Date: 04/25/2018 OT Individual Time: 1300-1325 OT Individual Time Calculation (min): 25 min    Short Term Goals: Week 4:  OT Short Term Goal 1 (Week 4): Pt will be able to squat pivot to w/c with max A of 1.  OT Short Term Goal 1 - Progress (Week 4): Progressing toward goal OT Short Term Goal 2 (Week 4): Pt will pull pants over hips with max A for standing in Stedy OT Short Term Goal 2 - Progress (Week 4): Met OT Short Term Goal 3 (Week 4): Pt will don socks using AE with min A OT Short Term Goal 3 - Progress (Week 4): Met OT Short Term Goal 4 (Week 4): Pt will don shoes using AE PRN with min A OT Short Term Goal 4 - Progress (Week 4): Progressing toward goal  Skilled Therapeutic Interventions/Progress Updates:    Pt resting in w/c upon arrival.  Pt stated she had voided in her brief and needed to change. Pt stood in [place] with min A and completed all toileting tasks with steady A standing in [place]. Pt required assistance with brief but managed her clothing without assistance except for steady A standing.  Pt performed sit<>stand X 4 with min A.  Pt requested to be returned to bed and required max A for sit>supine.  Pt remained in bed with all needs within reach and son present.   Therapy Documentation Precautions:  Precautions Precautions: Fall, Back Precaution Comments: LUE NWB in splint Restrictions Weight Bearing Restrictions: Yes LUE Weight Bearing: Weight bearing as tolerated RLE Weight Bearing: Weight bearing as tolerated  Pain:  Pt denies pain  See Function Navigator for Current Functional Status.   Therapy/Group: Individual Therapy  [name] 04/25/2018, 2:37 PM

## 2018-04-25 NOTE — Progress Notes (Signed)
[place] PHYSICAL MEDICINE & REHABILITATION     PROGRESS NOTE    Subjective/Complaints: Pt without problems overnight. No bm with suppository but felt "she needed to go". When up on [facility] had syncopal episode per PA note (I had left hospital by then)  ROS: Patient denies fever, rash, sore throat, blurred vision, nausea, vomiting, diarrhea, cough, shortness of breath or chest pain, joint or back pain, headache, or mood change.   Objective: Vital Signs: Blood pressure 108/71, pulse 97, temperature 98 F (36.7 C), temperature source Oral, resp. rate 17, height 5\' 4"  (1.626 m), weight 67.6 kg (149 lb), SpO2 98 %. No results found. Recent Labs    04/24/18 0550  WBC 6.2  HGB 10.5*  HCT 31.7*  PLT 202   Recent Labs    04/24/18 0550  NA 133*  K 4.3  CL 99*  GLUCOSE 105*  BUN 5*  CREATININE 0.47  CALCIUM 8.9   CBG (last 3)  No results for input(s): GLUCAP in the last 72 hours.  Wt Readings from Last 3 Encounters:  04/15/18 67.6 kg (149 lb)  03/20/18 93.4 kg (206 lb)  02/15/18 94.1 kg (207 lb 8 oz)    Physical Exam: (pre syncopal event) Constitutional: No distress . Vital signs reviewed. HEENT: EOMI, oral membranes moist Neck: supple Cardiovascular: RRR without murmur. No JVD    Respiratory: CTA Bilaterally without wheezes or rales. Normal effort    GI: BS +, non-tender, non-distended  Neurological: Motor B/l UE 5/5.  B/l LE 3+-4-/5 HF, KE and 3+/5 ADF. --stable motor exam. Sensory decr 1/2 below level of injury to LT/proprioception without change Skin:warm Psychiatric:  Remains pleasant  Assessment/Plan: 1. Paraplegia and functional deficits secondary to T8 myelopathy, left wrist fracture which require 3+ hours per day of interdisciplinary therapy in a comprehensive inpatient rehab setting. Physiatrist is providing close team supervision and 24 hour management of active medical problems listed below. Physiatrist and rehab team continue to assess barriers to  discharge/monitor patient progress toward functional and medical goals.  Function:  Bathing Bathing position Bathing activity did not occur: N/A Position: Wheelchair/chair at sink  Bathing parts Body parts bathed by patient: Right arm, Left arm, Chest, Abdomen, Front perineal area, Right upper leg, Left upper leg Body parts bathed by helper: Buttocks, Right lower leg, Left lower leg  Bathing assist Assist Level: Assistive device, 2 helpers Assistive Device Comment: reacher    Upper Body Dressing/Undressing Upper body dressing Upper body dressing/undressing activity did not occur: N/A What is the patient wearing?: Pull over shirt/dress Bra - Perfomed by patient: Thread/unthread right bra strap, Thread/unthread left bra strap Bra - Perfomed by helper: Hook/unhook bra (pull down sports bra) Pull over shirt/dress - Perfomed by patient: Thread/unthread right sleeve, Thread/unthread left sleeve, Put head through opening, Pull shirt over trunk Pull over shirt/dress - Perfomed by helper: Put head through opening Button up shirt - Perfomed by patient: Thread/unthread right sleeve, Thread/unthread left sleeve, Button/unbutton shirt Button up shirt - Perfomed by helper: Pull shirt around back    Upper body assist Assist Level: Supervision or verbal cues      Lower Body Dressing/Undressing Lower body dressing Lower body dressing/undressing activity did not occur: N/A What is the patient wearing?: Pants, Socks, Shoes, AFO     Pants- Performed by patient: Thread/unthread left pants leg, Thread/unthread right pants leg Pants- Performed by helper: Pull pants up/down   Non-skid slipper socks- Performed by helper: Don/doff right sock, Don/doff left sock Socks - Performed by  patient: Don/doff right sock, Don/doff left sock Socks - Performed by helper: Don/doff right sock, Don/doff left sock   Shoes - Performed by helper: Don/doff right shoe, Don/doff left shoe, Fasten right, Fasten left   AFO -  Performed by helper: Don/doff left AFO      Lower body assist Assist for lower body dressing: 2 Helpers      Toileting Toileting Toileting activity did not occur: Safety/medical concerns Toileting steps completed by patient: Performs perineal hygiene Toileting steps completed by helper: Adjust clothing prior to toileting, Adjust clothing after toileting Toileting Assistive Devices: Other (comment)(stedy/walker)  Toileting assist Assist level: Touching or steadying assistance (Pt.75%)   Transfers Chair/bed transfer   Chair/bed transfer method: Stand pivot Chair/bed transfer assist level: Moderate assist (Pt 50 - 74%/lift or lower) Chair/bed transfer assistive device: Armrests, Walker, Orthosis Mechanical lift: Stedy   Locomotion Ambulation Ambulation activity did not occur: Safety/medical concerns   Max distance: 76ft Assist level: 2 helpers(modA gait, w/c follow)   Wheelchair   Type: Manual Max wheelchair distance: 150' Assist Level: Supervision or verbal cues  Cognition Comprehension Comprehension assist level: Follows complex conversation/direction with no assist  Expression Expression assist level: Expresses complex ideas: With no assist  Social Interaction Social Interaction assist level: Interacts appropriately with others - No medications needed.  Problem Solving Problem solving assist level: Solves complex problems: With extra time  Memory Memory assist level: Complete Independence: No helper   Medical Problem List and Plan: 1.Paraplegia and funtional deficitssecondary to T8 meningioma/resection with myeopathy -recent left wrist fracture, now in left wrist splint -Continue CIR  -SNF this week.  Team conf today.  2. 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  3. Pain Management:Hydrocodone prn 4. Mood:LCSW to  follow for evaluation and support. 5. Neuropsych: This patientiscapable of making decisions on herown behalf. 6. Skin/Wound Care:Routine pressure relief measures. 7. Fluids/Electrolytes/Nutrition:  -I personally reviewed the patient's labs today.      -potassium 4.3 today 6/10.     -weaning supplement  -mg up to 1.9, decr supp to BID  --reg diet 8. Post op ileus/neurogenic bowel:    Had bm this morning with suppository  -continue probiotic and fiber to help bulk her up  -continued scheduled dulcolax supp every morning 9.Neurogenic bladder: foley out bladder incontinence  10. GERD:managed withPPIbid. 11. Hyponatremia:  Reslved   -hctz stopped  Na+ 133 today   Cont to monitor 12.Leftwrist fracture: NWB. [place] 13. Dyspepsia  -protonix  -prn maalox 14. Acute lower UTI:  -E Coli  -Keflex changed to fosfomycin x 2 doses 15. Acute blood loss anemia  Hemoglobin 10.5 610 16. Syncopal episode: suspect this was  Vasovagal with BM attempt  -agree with holding lisinopril and robaxin  -treat supportively           LOS (Days) [place] EVALUATION WAS PERFORMED  Meredith Staggers, MD 04/25/2018 8:57 AM

## 2018-04-25 NOTE — Discharge Summary (Addendum)
Physician Discharge Summary  Patient ID: Hannah Jefferson MRN: [id number] DOB/AGE: 60-Jan-1957 44 y.o.  Admit date: 03/21/2018 Discharge date: 04/28/2018  Discharge Diagnoses:  Principal Problem:   Thoracic myelopathy Active Problems:   Other fatigue   Neurogenic bowel   Neurogenic bladder   Left wrist fracture, sequela   Ileus, postoperative (HCC)   Acute deep vein thrombosis (DVT) of right femoral vein (HCC)   Hyponatremia   Hypokalemia   Hypomagnesemia   Acute blood loss anemia   E. coli UTI   Discharged Condition: stable  Significant Diagnostic Studies: Dg Wrist 2 Views Left  Result Date: 04/12/2018 CLINICAL DATA:  Distal radial fracture. EXAM: LEFT WRIST - 2 VIEW COMPARISON:  None. FINDINGS: Comminuted distal radial metaphysis fracture extending to the articular surface transfixed with a volar side plate and multiple interlocking screws without significant callus formation. Displaced ununited distal ulnar styloid process fracture. No other fracture or dislocation. IMPRESSION: 1. Comminuted distal radial metaphysis fracture extending to the articular surface transfixed with a volar side plate and multiple interlocking screws without significant callus formation. 2. Displaced ununited distal ulnar styloid process fracture. Electronically Signed   By: Kathreen Devoid   On: 04/12/2018 17:14   Dg Abd 1 View  Result Date: 04/14/2018 CLINICAL DATA:  Ileus EXAM: ABDOMEN - 1 VIEW COMPARISON:  04/06/2018 FINDINGS: IVC filter remains in place. Unchanged. Prior cholecystectomy. Decreasing gaseous distention of the colon. Gas noted throughout nondistended large and small bowel. No evidence of obstruction or free air. No organomegaly. IMPRESSION: No current evidence for obstruction or ileus. Prior cholecystectomy. Electronically Signed   By: Rolm Baptise M.D.   On: 04/14/2018 09:01   Dg Abd 1 View  Result Date: 04/06/2018 CLINICAL DATA:  Ileus EXAM: ABDOMEN - 1 VIEW COMPARISON:  04/04/2018  FINDINGS: Diffuse gaseous distention of bowel has improved since prior study. Mildly prominent large and small bowels remain. NG tube tip is in the proximal stomach. Prior cholecystectomy. No organomegaly or free air. IVC filter now noted. IMPRESSION: Improving gaseous distention of bowel/ileus. Electronically Signed   By: Rolm Baptise M.D.   On: 04/06/2018 09:49   Dg Abd 1 View  Result Date: 04/04/2018 CLINICAL DATA:  Follow-up ileus EXAM: ABDOMEN - 1 VIEW COMPARISON:  Abdominal radiographs from 1 day prior FINDINGS: Enteric tube terminates in the very proximal stomach, with slight interval retraction of the tube. Stomach appears decompressed. Mildly dilated small bowel loops in the central abdomen, mildly decreased in caliber. Prominent gaseous distention of the right and transverse colon, slightly improved in the transverse colon and slightly worsened in the cecum. Surgical clip overlies the deep pelvis. No evidence of pneumatosis or pneumoperitoneum. Clear lung bases. No radiopaque nephrolithiasis. IMPRESSION: 1. Enteric tube terminates in the very proximal stomach, with slight interval retraction of the tube. Stomach appears decompressed. 2. Persistent radiographic evidence of adynamic ileus, improved in the small bowel and transverse colon and slightly worsened in the cecum. Electronically Signed   By: Ilona Sorrel M.D.   On: 04/04/2018 09:30   Dg Abd 1 View  Result Date: 04/03/2018 CLINICAL DATA:  Ileus.  NG tube in place. EXAM: ABDOMEN - 1 VIEW COMPARISON:  CT abdomen and pelvis 04/01/2018. FINDINGS: Diffuse gaseous distention of large and small bowel remains. NG tube is in the stomach. The stomach is collapsed. There is no free air. Distal colonic air is not clearly visualized. Levoconvex curvature is present in the lumbar spine. Heart is mildly enlarged. Lung bases are clear. IMPRESSION: 1. NG  tube in place decompression of the stomach. 2. Persistent diffuse distention of large and small bowel  compatible with ileus or distal obstruction. Electronically Signed   By: San Morelle M.D.   On: 04/03/2018 14:12   Dg Abd 1 View  Result Date: 03/31/2018 CLINICAL DATA:  Nasogastric catheter placement, ileus EXAM: ABDOMEN - 1 VIEW COMPARISON:  03/30/2018 FINDINGS: Nasogastric catheter is noted to be within the stomach following fluoroscopic placement. 1 minutes and 12 seconds of fluoroscopy was utilized. IMPRESSION: Nasogastric catheter within the stomach. Electronically Signed   By: Inez Catalina M.D.   On: 03/31/2018 09:15   Dg Abd 1 View  Result Date: 03/30/2018 CLINICAL DATA:  Ileus. EXAM: ABDOMEN - 1 VIEW COMPARISON:  03/29/2018. FINDINGS: NG tube not noted on today's exam. Persistent distended loops of colon and small bowel noted. Findings most consistent adynamic ileus. Follow-up exam suggested to demonstrate resolution and to exclude obstruction. Surgical clips right upper quadrant and pelvis. Pelvic calcifications consistent phleboliths. Lumbar spine scoliosis concave right. IMPRESSION: NG tube not visualized on today's exam. Persistent distended loops of colon and small bowel. Findings most consistent with persistent adynamic ileus. Similar findings noted on prior exam. Follow-up exams to demonstrate resolution suggested. Electronically Signed   By: Marcello Moores  Register   On: 03/30/2018 12:31   Ct Abdomen Pelvis W Contrast  Result Date: 04/01/2018 CLINICAL DATA:  Abdominal distention, post tumor resection from back. EXAM: CT ABDOMEN AND PELVIS WITH CONTRAST TECHNIQUE: Multidetector CT imaging of the abdomen and pelvis was performed using the standard protocol following bolus administration of intravenous contrast. CONTRAST:  177mL OMNIPAQUE IOHEXOL 300 MG/ML  SOLN COMPARISON:  03/27/2018 CT, abdomen radiographs from 03/31/2018 and CXR from 03/31/2018 FINDINGS: Lower chest: Borderline cardiomegaly. PICC line tip noted in the right atrium. There are trace bilateral pleural effusions with  atelectasis. Hepatobiliary: No focal liver abnormality is seen. Status post cholecystectomy. No biliary dilatation. Pancreas: Unremarkable. No pancreatic ductal dilatation or surrounding inflammatory changes. Spleen: Normal in size without focal abnormality. Adrenals/Urinary Tract: Adrenal glands are unremarkable. Kidneys are normal, without renal calculi, focal lesion, or hydronephrosis. Bladder is unremarkable. Stomach/Bowel: Decompressed stomach with gastric tube in place. Contrast distended small bowel re-demonstrated without mechanical bowel obstruction compatible with postoperative ileus. Fluid-filled large bowel loops without contrast is noted to the level of the rectum. Vascular/Lymphatic: No significant vascular findings are present. No enlarged abdominal or pelvic lymph nodes. Reproductive: Uterus and bilateral adnexa are unremarkable. Other: No abdominopelvic ascites or free intraperitoneal air. Musculoskeletal: Postsurgical change involving posterior elements from T7 through T8. Levoconvex curvature of the lumbar spine. Chronic spondylolisthesis of L5 with grade 1 bordering grade 2 spondylolisthesis of L5 on S1. Degenerative disc disease L2-3 and L5-S1. IMPRESSION: 1. Persistent small bowel ileus with enteric contrast noted to the level of the mid to distal ileum. No mechanical source of bowel obstruction. 2. Postop change of the lower thoracic spine. 3. Trace bilateral pleural effusions with atelectasis. Electronically Signed   By: Ashley Royalty M.D.   On: 04/01/2018 01:11   Ir Ivc Filter Plmt / S&i /img Guid/mod Sed  Result Date: 04/05/2018 INDICATION: Pulmonary thromboembolism.  DVT.  Right heart strain. EXAM: IVC FILTER,INFERIOR VENA CAVOGRAM MEDICATIONS: None. ANESTHESIA/SEDATION: Fentanyl 50 mcg IV; Versed 1 mg IV Moderate Sedation Time:  12 minutes The patient was continuously monitored during the procedure by the interventional radiology nurse under my direct supervision. FLUOROSCOPY TIME:   Fluoroscopy Time: 2 minutes  seconds (30 mGy). COMPLICATIONS: None immediate. PROCEDURE: Informed written consent  was obtained from the patient after a thorough discussion of the procedural risks, benefits and alternatives. All questions were addressed. Maximal Sterile Barrier Technique was utilized including caps, mask, sterile gowns, sterile gloves, sterile drape, hand hygiene and skin antiseptic. A timeout was performed prior to the initiation of the procedure. The right neck was prepped with Betadine in a sterile fashion, and a sterile drape was applied covering the operative field. A sterile gown and sterile gloves were used for the procedure. The right jugular vein was noted to be patent initially with ultrasound. Under sonographic guidance, a micropuncture needle was inserted into the right jugular vein (Ultrasound image documentation was performed). It was removed over an 018 wire which was up-sized to a [place]. The sheath was inserted over the wire and into the IVC. IVC venography was performed. The temporary filter was then deployed in the infrarenal IVC. The sheath was removed and hemostasis was achieved with direct pressure. FINDINGS: IVC venography demonstrates renal vein inflow at L1. No DVT and no venous anomaly. No evidence of mega cava. Final image demonstrates an IVC filter in place with its tip at the L1 inferior endplate. IMPRESSION: Successful infrarenal IVC filter placement. This is a temporary filter. It can be removed or remain in place to become permanent. PLAN: This IVC filter is potentially retrievable. The patient will be assessed for filter retrieval by Interventional Radiology in approximately 8-12 weeks. Further recommendations regarding filter retrieval, continued surveillance or declaration of device permanence, will be made at that time. Electronically Signed   By: Marybelle Killings M.D.   On: 04/05/2018 11:56   Dg Chest Port 1v Same Day  Result Date: 03/31/2018 CLINICAL DATA:  Chest  pain abdominal pain EXAM: PORTABLE CHEST 1 VIEW COMPARISON:  None. FINDINGS: NG in the stomach.  Left arm PICC tip in the right atrium. Heart size normal. Negative for heart failure. Minimal right lower lobe atelectasis. No effusion. IMPRESSION: PICC tip in the right atrium Minimal right lower lobe atelectasis. Electronically Signed   By: Franchot Gallo M.D.   On: 03/31/2018 16:05   Dg Abd Portable 1v  Result Date: 03/31/2018 CLINICAL DATA:  Abdominal pain EXAM: PORTABLE ABDOMEN - 1 VIEW COMPARISON:  03/30/2018 and 03/31/2018 FINDINGS: NG remains in the stomach. Diffuse large and small bowel dilatation unchanged. Findings compatible with ileus which is predominately involving the colon. Surgical clips in the gallbladder fossa.  Lumbar scoliosis IMPRESSION: NG remains in the stomach.  Moderate to severe ileus unchanged. Electronically Signed   By: Franchot Gallo M.D.   On: 03/31/2018 16:01   Dg Addison Bailey G Tube Plc W/fl-no Rad  Result Date: 04/28/2018 Fluoro was used, but no Radiologist interpretation will be provided. Please refer to "NOTES" tab for provider progress note.   Labs:  Basic Metabolic Panel: Recent Labs  Lab 04/21/18 0804 04/24/18 0550 04/25/18 0858 04/27/18 0705  NA 131* 133* 129* 135  K 4.2 4.3 3.9 3.9  CL 96* 99* 99* 100*  CO2 26 25 19* 27  GLUCOSE 154* 105* 121* 101*  BUN <5* 5* 6 5*  CREATININE 0.71 0.47 0.44 0.48  CALCIUM 8.9 8.9 8.6* 8.8*  MG 1.7 1.9  --  1.7    CBC: CBC Latest Ref Rng & Units 04/25/2018 04/24/2018 04/17/2018  WBC 4.0 - 10.5 K/uL 8.0 6.2 5.0  Hemoglobin 12.0 - 15.0 g/dL 10.8(L) 10.5(L) 10.9(L)  Hematocrit 36.0 - 46.0 % 33.6(L) 31.7(L) 32.6(L)  Platelets 150 - 400 K/uL 208 202 217    CBG:  No results for input(s): GLUCAP in the last 168 hours.  Brief HPI:   Debanhi Blaker is a 62 year old female with history of HTN, gait disorder with falls, weakness and sensory deficits LLE> RLE since 08/2017. Work up revealed T7-T8 mass with marked cord compression  and large enhancing tail emanating from dural based mass extending around the cord.  Mass was meant to felt to be a meningioma and she was started on Decadron and referred to neurosurgery for input.  She did sustain a fall prior to evaluation requiring ORIF left wrist on 03/08/2018 by Dr. Caralyn Guile.  She was evaluated by Dr. Redmond Pulling at [facility] and underwent T7-T8 tumor resection on 03/13/2018  Postop course significant for issues with abdominal pain with adynamic ileus requiring NG tube for decompression.  This was removed on 5/5 and diet was advanced to regular however had issues with reports of diarrhea and abdominal discomfort prior to discharge.  She has had issues with urinary retention and failed vodiing trials X 2 therefore foley replaced. She has had issues with hyponatremia, incidental findings of acute right peroneal DVT on 5/2 with recommendations for repeat Dopplers in a week as well as paraplegia.  Therapy was initiated and CIR was recommended due to deficits in mobility as well as self-care tasks.  Chart reviewed and patient was felt to be a good rehab candidate.   Hospital Course: Hannah Jefferson was admitted to rehab 03/21/2018 for inpatient therapies to consist of PT and OT at least three hours five days a week. Past admission physiatrist, therapy team and rehab RN have worked together to provide customized collaborative inpatient rehab. She was noted to have abdominal distension with ileus at admission and was started on Reglan as well as modified diet. KUB showed gaseous distension of colon which continued to worsen. She was also severely hypokalemic and required multiple runs of IV KCL as well as magnesium for supplementation of hypomagnesemia. .  General surgery as well as GI was consulted to help with input/management.  Conservative care recommended with aggressive supplementation of hypokalemia.   CT abdomen/pelvis on 5/13 showed post op ileus without obstruction. CT abdomen was repeated on  5/18 due to drop in H/H and severe pain--this was negative for bleed and showed continued ileus. Colonic decompression not needed per GI and Dr Cristina Gong recommended keeping  K>4.0 and conservative care with slow initiation of diet when able. As symptoms improved, she was started on clears with good tolerance therefore NGT removed and diet advanced slowly to regular. Ileus has resolved and colonic distension is gradually improving.  Reglan has been being weaned off and bowel program has been adjusted with good results.  Hyponatremia was treated with IVF and HCTZ was discontinued. Po intake has improved with family providing supplements from home. Most recent BMET showed mild hyponatremia with stable K+ and Mg+ levels.   Dopplers were repeated on 05/11 for monitoring and showed acute mobile DVT in R-CFV as well as right posterior tibial and peroneal veins. She was placed on bedrest and started on IV heparin for treatment. She did have coffee ground drainage from NGT and CBC was monitored with serial checks. She developed thrombocytopenia with drop in platelets to 72 as well as drop in H/H to 9.1/27.1 with positive HITpanel. Heparin was discontinued and IVC filter placed on 05/22 by Dr. Vernard Gambles.  No signs of acute bleeding most recent check showed H/H is stable at 10.8/33.6 and thrombocytopenia has resolved. Foley was discontinued past admission and bladder  program initiated. She is currently voiding without difficulty.  E coli UTI was treated with rocephin X 7 days. She had issues with repeat nausea and fatigue on 05/31 and repeat urine culture showed recurrent E coli UTI. She was treated with 2 doses of Fosfomycin.   She has had issues with dizziness due to orthostatic symptoms with increase in activity and lisinopril was discontineud on 06/11. Back pain is controlled and robaxin was weaned to prn basis to avoid orthostatic changes. Her family and team has been providing ego support to help with anxiety and no  signs of depression noted. Her anxiety levels are improving. Back incision is healing well without signs of infection. Ortho has followed up on left wrist fracture with change out to wrist splint and advance to WBAT by 5/29. BLE strength has improved from 2/5 to 3+ to 4-/5. Her progress has been hindered by medical issues but she is showing improvement in activity tolerance and mobility. She continues to require extensive assistance and family has elected on SNF for progressive therapy. Patient's discharge was held for 24 hours due to issues with insurance. She was discharged to [facility] on 04/27/18     Rehab course: During patient's stay in rehab weekly team conferences were held to monitor patient's progress, set goals and discuss barriers to discharge. At admission, patient required total assist for basic self care tasks and max to total assist +2 for mobility.  She  has had improvement in activity tolerance, balance, postural control as well as ability to compensate for deficits. She is able to complete bathing and dressing with mod assist. She requires total assist for hygeine and clothing management. She requires min to max assist for transfers depending on fatigue levels. She is able to propel her wheelchair with mod assist. She has been able to ambulate 30' with left platform walker with mod to max assist.    Discharge disposition: 03-Skilled Nursing Facility  Diet: Regular--as tolerated.   Special Instructions: 1. Back precautions. 2. Continue to adjust bowel regimen as needed. 3. Recheck BMET in 3-5 days.  Need to keep potassium levels at 4 or higher.   Discharge Instructions    Ambulatory referral to Physical Medicine Rehab   Complete by:  As directed    3-4 weeks follow up appt     Allergies as of 04/28/2018      Reactions   Heparin    HIT (Heparin antibody = 2.866, Serotonin 65%. positive      Medication List    STOP taking these medications   dexamethasone 4 MG  tablet Commonly known as:  DECADRON   gabapentin 300 MG capsule Commonly known as:  NEURONTIN   HYDROcodone-acetaminophen 5-325 MG tablet Commonly known as:  NORCO/VICODIN   lisinopril-hydrochlorothiazide 20-12.5 MG tablet Commonly known as:  PRINZIDE,ZESTORETIC   omeprazole 20 MG capsule Commonly known as:  PRILOSEC   oxybutynin 5 MG tablet Commonly known as:  DITROPAN   polyethylene glycol packet Commonly known as:  MIRALAX / GLYCOLAX   senna 8.6 MG Tabs tablet Commonly known as:  SENOKOT     TAKE these medications   acetaminophen 325 MG tablet Commonly known as:  TYLENOL Take 1-2 tablets (325-650 mg total) by mouth every 4 (four) hours as needed for mild pain. What changed:    medication strength  how much to take  when to take this   bisacodyl 10 MG suppository Commonly known as:  DULCOLAX Place 1 suppository (10 mg total) rectally every morning.  What changed:    when to take this  reasons to take this   calcium carbonate 500 MG chewable tablet Commonly known as:  TUMS - dosed in mg elemental calcium Chew 1 tablet by mouth 3 (three) times daily as needed for indigestion or heartburn.   Gerhardt's butt cream Crea Apply 1 application topically 3 (three) times daily as needed for irritation.   hydrocortisone-pramoxine rectal foam Commonly known as:  PROCTOFOAM-HC Place 1 applicator rectally 3 (three) times daily.   magnesium oxide 400 (241.3 Mg) MG tablet Commonly known as:  MAG-OX Take 1 tablet (400 mg total) by mouth 2 (two) times daily.   methocarbamol 500 MG tablet Commonly known as:  ROBAXIN Take 1 tablet (500 mg total) by mouth every 6 (six) hours as needed for muscle spasms. What changed:    when to take this  reasons to take this  additional instructions   MUSCLE RUB 10-15 % Crea Apply 1 application topically as needed for muscle pain.   pantoprazole 40 MG tablet Commonly known as:  PROTONIX Take 1 tablet (40 mg total) by mouth  daily.   polycarbophil 625 MG tablet Commonly known as:  FIBERCON Take 1 tablet (625 mg total) by mouth daily.   potassium chloride SA 20 MEQ tablet Commonly known as:  K-DUR,KLOR-CON Take 2 tablets (40 mEq total) by mouth 2 (two) times daily.   pyridoxine 200 MG tablet Commonly known as:  B-6 Take 200 mg by mouth daily.   saccharomyces boulardii 250 MG capsule Commonly known as:  FLORASTOR Take 1 capsule (250 mg total) by mouth 2 (two) times daily.   sodium phosphate 7-19 GM/118ML Enem Place 133 mLs (1 enema total) rectally daily as needed for severe constipation.   tamsulosin 0.4 MG Caps capsule Commonly known as:  FLOMAX Take 0.4 mg by mouth daily after supper.   vitamin C 500 MG tablet Commonly known as:  ASCORBIC ACID Take 500 mg by mouth 2 (two) times daily.       Contact information for follow-up providers    Meredith Staggers, MD Follow up.   Specialty:  Physical Medicine and Rehabilitation Why:  office will call you with follow up appointment Contact information: [address] [place] [address] [phone]        Iran Planas, MD. Call.   Specialty:  Orthopedic Surgery Why:  for follow up in 1-2 weeks Contact information: [address] [place] [address] [phone]        Grayland Ormond, MD. Call.   Specialty:  Neurosurgery Why:  for post op check Contact information: [place] [address] [phone]        Ronald Lobo, MD. Call.   Specialty:  Gastroenterology Why:  as needed Contact information: [address] [place] [address] [phone]        Greer Pickerel, MD. Call.   Specialty:  General Surgery Why:  as needed Contact information: [place] [address] [phone]            Contact information for after-discharge care    Destination    [facility] .   Service:  Skilled Nursing Contact  information: [place] [phone]                  Signed: Bary Leriche 04/28/2018, 11:51 AM

## 2018-04-25 NOTE — Progress Notes (Signed)
Occupational Therapy Session Note  Patient Details  Name: Hannah Jefferson MRN: [id number] Date of Birth: 08/31/1956  Today's Date: 04/25/2018 OT Individual Time: 7078-6754 OT Individual Time Calculation (min): 40 min  and Today's Date: 04/25/2018 OT Missed Time: 20 Minutes Missed Time Reason: Other (comment)(lightheaded and feeling "bad")   Short Term Goals: Week 5:  OT Short Term Goal 1 (Week 5): STG=LTG secondary to ELOS  Skilled Therapeutic Interventions/Progress Updates:    Pt sitting on BSC upon arrival.  Pt performed sit<>stand with Stedy X 4 for perineal hygiene and was able to clean in front but required assistance to clean buttocks.  Pt c/o lightheadness after 4th sit<>stand and stated she felt like she was going to pass out.  Pt returned to bed and RN notified. BP 108/71.  Pt remained in bed with all needs within reach.  Pt missed 20 mins skilled OT services.   Therapy Documentation Precautions:  Precautions Precautions: Fall, Back Precaution Comments: LUE NWB in splint Restrictions Weight Bearing Restrictions: Yes LUE Weight Bearing: Weight bearing as tolerated RLE Weight Bearing: Weight bearing as tolerated General: General OT Amount of Missed Time: 20 Minutes Pain:  Pt denies pain  See Function Navigator for Current Functional Status.   Therapy/Group: Individual Therapy  [name] 04/25/2018, 8:43 AM

## 2018-04-25 NOTE — Progress Notes (Signed)
Patient with presyncopal episode while getting off BSC after DM. Likely vasovagal episode as BP have been running low. Will discontinue lisinopril and change robaxin to prn. Check orthostatic BP as had been getting dizzy with activity per nursing. May bolus patient if drops noted. Also check EKG to rule out acute changes. Labs yesterday looked good but will recheck for stability.

## 2018-04-25 NOTE — NC FL2 (Signed)
[place] LEVEL OF CARE SCREENING TOOL     IDENTIFICATION  Patient Name: Hannah Jefferson Birthdate: Feb 23, 1956 Sex: female Admission Date (Current Location): 03/21/2018  [facility] and [place] Number:  Herbalist and Address:  [facility] [place] [address] [place], [place] [address]      Provider Number: [id number]  Attending Physician Name and Address:  Meredith Staggers, MD  Relative Name and Phone Number:       Current Level of Care: Other (Comment)(Acute Inpatient Rehab) Recommended Level of Care: [place] Prior Approval Number:    Date Approved/Denied:   PASRR Number: [id number]  Discharge Plan: SNF    Current Diagnoses: Patient Active Problem List   Diagnosis Date Noted  . Ileus ([place])   . Acute blood loss anemia   . Acute deep vein thrombosis (DVT) of right femoral vein ([place])   . Acute lower UTI   . Hyponatremia   . Hypokalemia   . Hypomagnesemia   . Ileus, postoperative ([place])   . Thoracic myelopathy 03/21/2018  . Neurogenic bowel 03/21/2018  . Neurogenic bladder 03/21/2018  . Left wrist fracture, sequela 03/21/2018  . Numbness 02/15/2018  . Ataxic gait 02/15/2018  . Left leg weakness 02/15/2018  . Other fatigue 02/15/2018  . Urinary incontinence 02/15/2018  . Bowel incontinence 02/15/2018    Orientation RESPIRATION BLADDER Height & Weight     Self, Time, Situation, Place  Normal Continent Weight: 67.6 kg (149 lb) Height:  5\' 4"  (162.6 cm)  BEHAVIORAL SYMPTOMS/MOOD NEUROLOGICAL BOWEL NUTRITION STATUS      Continent Diet(soft diet, thin)  AMBULATORY STATUS COMMUNICATION OF NEEDS Skin   Extensive Assist Verbally Other (Comment)(MASD to perineum)                       Personal Care Assistance Level of Assistance  Bathing, Dressing Bathing Assistance: Limited assistance(min/ mod assist)   Dressing Assistance: Limited assistance(min/mod assist) Total Care Assistance: Limited  assistance(min/mod assist)   Functional Limitations Info             SPECIAL CARE FACTORS FREQUENCY  PT (By licensed PT), OT (By licensed OT)     PT Frequency: 5x/wk OT Frequency: 5x/wk            Contractures Contractures Info: Not present    Additional Factors Info  Allergies, Code Status Code Status Info: full Allergies Info: heparin           Current Medications (04/25/2018):  This is the current hospital active medication list Current Facility-Administered Medications  Medication Dose Route Frequency Provider Last Rate Last Dose  . acetaminophen (TYLENOL) tablet 325-650 mg  325-650 mg Oral Q4H PRN Bary Leriche, PA-C   650 mg at 04/22/18 1919  . alum & mag hydroxide-simeth (MAALOX/MYLANTA) 200-200-20 MG/5ML suspension 30 mL  30 mL Oral Q4H PRN Meredith Staggers, MD      . bisacodyl (DULCOLAX) suppository 10 mg  10 mg Rectal Q0600 Meredith Staggers, MD   10 mg at 04/25/18 0543  . calcium carbonate (TUMS - dosed in mg elemental calcium) chewable tablet 200 mg of elemental calcium  1 tablet Oral TID WC Love, Pamela S, PA-C   200 mg of elemental calcium at 04/25/18 1157  . diphenhydrAMINE (BENADRYL) 12.5 MG/5ML elixir 12.5-25 mg  12.5-25 mg Oral Q6H PRN Love, Ivan Anchors, PA-C      . Gerhardt's butt cream   Topical TID PRN Love,  Ivan Anchors, PA-C      . guaiFENesin-dextromethorphan (ROBITUSSIN DM) 100-10 MG/5ML syrup 5-10 mL  5-10 mL Oral Q6H PRN Love, Pamela S, PA-C      . hydrocortisone-pramoxine (PROCTOFOAM-HC) rectal foam 1 applicator  1 applicator Rectal TID Bary Leriche, PA-C   1 applicator at 96/75/91 6384  . lidocaine (XYLOCAINE) 2 % jelly   Topical PRN Love, Pamela S, PA-C      . magnesium oxide (MAG-OX) tablet 400 mg  400 mg Oral Daily Meredith Staggers, MD   400 mg at 04/25/18 0754  . methocarbamol (ROBAXIN) tablet 500 mg  500 mg Oral Q6H PRN Love, Pamela S, PA-C      . MUSCLE RUB CREA   Topical PRN Meredith Staggers, MD      . pantoprazole (PROTONIX) EC tablet 40  mg  40 mg Oral Daily Meredith Staggers, MD   40 mg at 04/25/18 0754  . polycarbophil (FIBERCON) tablet 625 mg  625 mg Oral Daily Meredith Staggers, MD   625 mg at 04/25/18 0753  . polyethylene glycol (MIRALAX / GLYCOLAX) packet 17 g  17 g Oral Daily PRN Bary Leriche, PA-C   17 g at 04/18/18 0802  . potassium chloride SA (K-DUR,KLOR-CON) CR tablet 20 mEq  20 mEq Oral BID Meredith Staggers, MD   20 mEq at 04/25/18 0754  . prochlorperazine (COMPAZINE) tablet 5-10 mg  5-10 mg Oral Q6H PRN Bary Leriche, PA-C   10 mg at 04/20/18 2109   Or  . prochlorperazine (COMPAZINE) injection 5-10 mg  5-10 mg Intramuscular Q6H PRN Bary Leriche, PA-C   10 mg at 04/14/18 6659   Or  . prochlorperazine (COMPAZINE) suppository 12.5 mg  12.5 mg Rectal Q6H PRN Bary Leriche, PA-C   12.5 mg at 04/16/18 9357  . pyridOXINE (VITAMIN B-6) tablet 200 mg  200 mg Oral Q1200 Bary Leriche, PA-C   200 mg at 04/25/18 1157  . saccharomyces boulardii (FLORASTOR) capsule 250 mg  250 mg Oral BID Meredith Staggers, MD   250 mg at 04/25/18 0754  . senna (SENOKOT) tablet 8.6 mg  1 tablet Oral QHS Meredith Staggers, MD      . sodium phosphate (FLEET) 7-19 GM/118ML enema 1 enema  1 enema Rectal Daily PRN Love, Pamela S, PA-C      . tamsulosin (FLOMAX) capsule 0.4 mg  0.4 mg Oral QPC supper Love, Pamela S, PA-C   0.4 mg at 04/24/18 1714  . traZODone (DESYREL) tablet 25-50 mg  25-50 mg Oral QHS PRN Love, Pamela S, PA-C      . vitamin C (ASCORBIC ACID) tablet 500 mg  500 mg Oral BID AC Love, Pamela S, PA-C   500 mg at 04/25/18 1157     Discharge Medications: Please see discharge summary for a list of discharge medications.  Relevant Imaging Results:  Relevant Lab Results:   Additional Information SS# [id number]  Lennart Pall, LCSW

## 2018-04-25 NOTE — Progress Notes (Signed)
Physical Therapy Session Note  Patient Details  Name: Hannah Jefferson MRN: [id number] Date of Birth: Dec 08, 1955  Today's Date: 04/25/2018 PT Individual Time: 1000-1100 PT Individual Time Calculation (min): 60 min   Short Term Goals: Week 4:  PT Short Term Goal 1 (Week 4): =LTGs due to ELOS  Skilled Therapeutic Interventions/Progress Updates:    no c/o pain but reporting feeling tired/weak from earlier in the day.  Session focus on activity tolerance, functional mobility retraining, and out of bed tolerance during ADL/grooming tasks at sink level.    Pt requires min assist to come from supine>sit with multimodal cues for LE positioning and sequencing.  Sit<>stand for orthostatic BP assessment (see vitals flowsheet), with stedy for safety and increased standing time to allow for BP measurement.  Pt with slight drop in diasolic BP (11 points).  Transferred to w/c with stedy.  Pt completes lower body/arms/face bathing at sink level with set up assist, focus on use of BUEs and activity tolerance.  PT applied teds with total assist, mod assist for lower body dressing due to fatigue.  Pt requires mod assist to stand from w/c with platform RW and total assist to pull pants over hips.  Pt able to doff/don clean shirt with set up assist.  Set up in w/c with encouragement to try and stay up through lunch.  Positioned with call bell in reach and needs met.   Therapy Documentation Precautions:  Precautions Precautions: Fall, Back Precaution Comments: LUE NWB in splint Restrictions Weight Bearing Restrictions: Yes LUE Weight Bearing: Weight bearing as tolerated RLE Weight Bearing: Weight bearing as tolerated   See Function Navigator for Current Functional Status.   Therapy/Group: Individual Therapy  Michel Santee 04/25/2018, 3:18 PM

## 2018-04-25 NOTE — Patient Care Conference (Signed)
Inpatient RehabilitationTeam Conference and Plan of Care Update Date: 04/25/2018   Time: 2:00 PM    Patient Name: Hannah Jefferson      Medical Record Number: [id number]  Date of Birth: [birth year] Sex: Female         Room/Bed: 4W11C/4W11C-01 Payor Info: Payor: Theme park manager / Plan: Theme park manager OTHER / Product Type: *No Product type* /    Admitting Diagnosis: T7 8 MENOGIONE  RESECTION  Admit Date/Time:  03/21/2018  1:32 PM Admission Comments: No comment available   Primary Diagnosis:  Thoracic myelopathy Principal Problem: Thoracic myelopathy  Patient Active Problem List   Diagnosis Date Noted  . Ileus ([place])   . Acute blood loss anemia   . Acute deep vein thrombosis (DVT) of right femoral vein ([place])   . Acute lower UTI   . Hyponatremia   . Hypokalemia   . Hypomagnesemia   . Ileus, postoperative ([place])   . Thoracic myelopathy 03/21/2018  . Neurogenic bowel 03/21/2018  . Neurogenic bladder 03/21/2018  . Left wrist fracture, sequela 03/21/2018  . Numbness 02/15/2018  . Ataxic gait 02/15/2018  . Left leg weakness 02/15/2018  . Other fatigue 02/15/2018  . Urinary incontinence 02/15/2018  . Bowel incontinence 02/15/2018    Expected Discharge Date: Expected Discharge Date: (SNF)  Team Members Present: Physician leading conference: Dr. Alger Simons Social Worker Present: Lennart Pall, LCSW Nurse Present: Leonette Nutting, RN PT Present: Dwyane Dee, PT OT Present: Roanna Epley, [place], OT SLP Present: Weston Anna, SLP PPS Coordinator present : Daiva Nakayama, RN, CRRN     Current Status/Progress Goal Weekly Team Focus  Medical   ileus improved. off all meds and on regular diet. beginning to establish bowel regmien  see prior  optimize bowel program, urine mgt   Bowel/Bladder   Continent /Incontinent episodes of B/B , LBM 6/10./19   Resume normal patterns of continence  Assess qs Toileting needs and provide care,   Swallow/Nutrition/ Hydration          ADL's   bathing EOB/w/c-mod A; UB dressing-supervision; LB dressing-mod A sit<>stand; functional tranfsers with PFRW-mod A; sit<>stand-min A  min A overall with LB self care and transfers, s/u UB self care  activity tolerance, bed mobility, sitting balance, functional transfers   Mobility   min/mod for stand/pivot with platform RW depending on fatigue, gait up to 30' with platform RW, lightweight w/c mobility supervision  min assist w/c level transfers; mod assist controlled environment gait goals; mod I w/c mobility  increasing endurance, functional transfers with PRW, NMR, SCI education   Communication             Safety/Cognition/ Behavioral Observations            Pain   denies pain  Free of pain < 2 goal  QS assessment and prn provide prn medication and monitor effective    Skin   Surgical incison to back w/o complication and healed, MASD to perineum improving continue regime,   skin free of breakdown or injury   Assess QS/PRN     Rehab Goals Patient on target to meet rehab goals: Yes Rehab Goals Revised: medically improving and more able to focus on rehab *See Care Plan and progress notes for long and short-term goals.     Barriers to Discharge  Current Status/Progress Possible Resolutions Date Resolved   Physician    Medical stability        medical mgt as documented in progress note  Nursing                  PT                    OT                  SLP                SW                Discharge Planning/Teaching Needs:  Continue to pursue SNF placement  NA with change in d/c plan   Team Discussion:  Remains relatively medically stable, however, still with some nausea at times.  Had a vasovagal episode this morning.  Continues to make gains with PT/OT.  Medically ready for SNF placement and SW reports bed search underway.  Revisions to Treatment Plan:  None    Continued Need for Acute Rehabilitation Level of Care: The patient requires daily medical  management by a physician with specialized training in physical medicine and rehabilitation for the following conditions: Daily direction of a multidisciplinary physical rehabilitation program to ensure safe treatment while eliciting the highest outcome that is of practical value to the patient.: Yes Daily medical management of patient stability for increased activity during participation in an intensive rehabilitation regime.: Yes Daily analysis of laboratory values and/or radiology reports with any subsequent need for medication adjustment of medical intervention for : Neurological problems;Post surgical problems  Nobel Brar 04/25/2018, 3:34 PM

## 2018-04-25 NOTE — Progress Notes (Signed)
Occupational Therapy Session Note  Patient Details  Name: Hannah Jefferson MRN: [id number] Date of Birth: 10/09/1956  Today's Date: 04/25/2018 OT Individual Time: 1415-1515 OT Individual Time Calculation (min): 60 min    Short Term Goals: Week 5:  OT Short Term Goal 1 (Week 5): STG=LTG secondary to ELOS  Skilled Therapeutic Interventions/Progress Updates:    Pt supine in bed with no c/o pain, reporting urinary incontinence. Pt completed bed level anterior peri-hygiene with set up, requiring min A for posterior perr-care. Pt completed bed mobility rolling R and L with (S). Max A provided for donning pants at bed level and total A for shoes. BP obtained supine d/t recent hypotensive episodes, with a reading of 95/61 and pt asymptomatic. Pt transitioned to EOB with manual facilitation provided for LE management, with a BP reading of 98/64. Pt requested to use stedy to transfer to w/c d/t fatigue from the day, with min lifting A provided. Pt was transported into rehab gym and completed 15 min on the reciprocal stepper to facilitate increased B LE flexibility/strength to facilitate ADL transfers. Vc provided throughout to grade task and for pt to take rest breaks when needed. Following stepper, pt's BP was 126/71, with edu provided to pt re importance of OOB mobility on BP maintenance and exercises to perform in bed, I.e. Calf raises. Pt was returned to room and used the stedy to transfer back to bed with min A overall. Pt left in bed with all needs met and friend present.   Therapy Documentation Precautions:  Precautions Precautions: Fall, Back Precaution Comments: LUE NWB in splint Restrictions Weight Bearing Restrictions: Yes LUE Weight Bearing: Weight bearing as tolerated RLE Weight Bearing: Weight bearing as tolerated General:   Vital Signs: Therapy Vitals Temp: 98 F (36.7 C) Pulse Rate: 99 Resp: 18 BP: 102/61 Patient Position (if appropriate): Lying Oxygen Therapy SpO2: 98 % O2  Device: Room Air Pain:   No pain reported  ADL: ADL ADL Comments: refer to functional navigator  See Function Navigator for Current Functional Status.   Therapy/Group: Individual Therapy   H  04/25/2018, 5:00 PM 

## 2018-04-25 NOTE — Progress Notes (Signed)
RN called to room during OT session this morning. Pt had complaints of dizziness and verbalized that she felt as though she " was going to pass out" Pt helped back to bed by OT. Vitals were baseline for patient. PA Reesa Chew notified and ortho vitals taken. Pt feels okay while lying down. Will continue to monitor.

## 2018-04-26 ENCOUNTER — Inpatient Hospital Stay (HOSPITAL_COMMUNITY): Payer: 59 | Admitting: Occupational Therapy

## 2018-04-26 ENCOUNTER — Inpatient Hospital Stay (HOSPITAL_COMMUNITY): Payer: 59 | Admitting: Physical Therapy

## 2018-04-26 MED ORDER — CALCIUM POLYCARBOPHIL 625 MG PO TABS: 625.0000 mg | ORAL_TABLET | Freq: Every day | ORAL | Status: AC

## 2018-04-26 MED ORDER — POTASSIUM CHLORIDE CRYS ER 20 MEQ PO TBCR
20.0000 meq | EXTENDED_RELEASE_TABLET | Freq: Three times a day (TID) | ORAL | Status: DC
  Administered 2018-04-26 (×2): 20 meq via ORAL
  Filled 2018-04-26 (×3): qty 1

## 2018-04-26 MED ORDER — HYDROCORTISONE ACE-PRAMOXINE 1-1 % RE FOAM: 1.0000 | Freq: Three times a day (TID) | RECTAL | 0 refills | Status: DC

## 2018-04-26 MED ORDER — MAGNESIUM OXIDE 400 (241.3 MG) MG PO TABS: 400.0000 mg | ORAL_TABLET | Freq: Every day | ORAL | Status: DC

## 2018-04-26 MED ORDER — METHOCARBAMOL 500 MG PO TABS: 500.0000 mg | ORAL_TABLET | Freq: Four times a day (QID) | ORAL | Status: DC | PRN

## 2018-04-26 MED ORDER — SACCHAROMYCES BOULARDII 250 MG PO CAPS: 250.0000 mg | ORAL_CAPSULE | Freq: Two times a day (BID) | ORAL | Status: DC

## 2018-04-26 MED ORDER — SENNA 8.6 MG PO TABS: 1.0000 | ORAL_TABLET | Freq: Every day | ORAL | 0 refills | Status: DC

## 2018-04-26 MED ORDER — POTASSIUM CHLORIDE CRYS ER 20 MEQ PO TBCR: 20.0000 meq | EXTENDED_RELEASE_TABLET | Freq: Three times a day (TID) | ORAL | Status: DC

## 2018-04-26 MED ORDER — PANTOPRAZOLE SODIUM 40 MG PO TBEC: 40.0000 mg | DELAYED_RELEASE_TABLET | Freq: Every day | ORAL | Status: DC

## 2018-04-26 MED ORDER — FLEET ENEMA 7-19 GM/118ML RE ENEM: 1.0000 | ENEMA | Freq: Every day | RECTAL | 0 refills | Status: DC | PRN

## 2018-04-26 NOTE — Progress Notes (Signed)
Physical Therapy Session Note  Patient Details  Name: Hannah Jefferson MRN: [id number] Date of Birth: 12-17-1955  Today's Date: 04/26/2018 PT Individual Time:  -      Short Term Goals: Week 4:  PT Short Term Goal 1 (Week 4): =LTGs due to ELOS  Skilled Therapeutic Interventions/Progress Updates:   Pt reported no pain prior to her treatment session this morning. PT initiated transfers<>BSC for toileting using stedy for urgency. Pt required verbal cues for forward trunk lean and proper hand placement for safe transition from seated w/c<>steady. PT provided min A for lowering from stedy to commode to initiate voiding. Pt required total assist for hygiene and clothing management. PT initiated LE strengthening and motor control in // bars for safety consideration with task completion. Pt performed single leg step ups to 1in step focusing on reducing B knee hyperextension by providing multimodal cues for maintaining slight knee flexion. Pt tolerated ~1-2 min in standing before requiring seated rest break in between sets. PT provided tactile cues to hamstrings and quads to facilitate proper sequencing for maintaining an upright position and prevention of knee buckling. PT provided min A for steadying in standing and reinforcement to reach back for arm rests prior to transitioning into sitting between sets. PT further progressed Pt to 3 in step for increased hip abductor activation for pelvic stability promotion/ strengthening. Pt responded well to exercise and was able to demonstrate performance of alternating toe tapping to 3 in step for 1 min duration before experiencing increasing fatigue requiring seated rest break. PT then initiated mini squats in // bars for x2 trials, utilizing swedish knee cage to L LE for second trial to provide tactile sensory feedback for improved motor control. PT provided tactile cues to R LE knee to promote slight flexion for prevention of hyperextension in standing and VC's to  initiate hip extension for upright posture. Pt reported she was unable to feel the knee cage providing tactile feedback; however, her knee hyperextension reduced when wearing device. Pt demo'd improved carry over and motor learning with increasing repetitions t/o session duration. Pt was returned to her room with caregiver present and all needs met.     Therapy Documentation Precautions:  Precautions Precautions: Fall, Back Precaution Comments: LUE NWB in splint Restrictions Weight Bearing Restrictions: Yes LUE Weight Bearing: Weight bearing as tolerated RLE Weight Bearing: Weight bearing as tolerated   See Function Navigator for Current Functional Status.   Therapy/Group: Individual Therapy  Floreen Comber 04/26/2018, 2:38 PM

## 2018-04-26 NOTE — Progress Notes (Signed)
Occupational Therapy Session Note  Patient Details  Name: Hannah Jefferson MRN: [id number] Date of Birth: [birth year]  Today's Date: 04/26/2018 OT Individual Time: 0867-6195 OT Individual Time Calculation (min): 59 min    Short Term Goals: Week 5:  OT Short Term Goal 1 (Week 5): STG=LTG secondary to ELOS  Skilled Therapeutic Interventions/Progress Updates:    Pt seen for ADL training with a focus on using AE and sit to stand and standing with RW.  Pt completed LB bathing from bed to allow for donning of TED hose first. She sat to EOB to don pants over feet with reacher. She then stood to standard RW with min -mod A of 1.  Pt used R hand to partially pull pants over hips.  She then pivoted to w/c with RW with +2 for safety and min A.  Mod to lower slowly to w/c.  She then completed all UB self care with mod I from w/c.  Pt in room with all needs met.  Therapy Documentation Precautions:  Precautions Precautions: Fall, Back Precaution Comments: LUE NWB in splint Restrictions Weight Bearing Restrictions: Yes LUE Weight Bearing: Weight bearing as tolerated RLE Weight Bearing: Weight bearing as tolerated  Pain: Pain Assessment Pain Scale: 0-10 Pain Score: 2  Pain Type: Acute pain Pain Location: Heel Pain Orientation: Right Pain Descriptors / Indicators: Aching Pain Frequency: Intermittent Pain Onset: On-going Patients Stated Pain Goal: 2 Pain Intervention(s): Medication (See eMAR) ADL: ADL ADL Comments: refer to functional navigator   See Function Navigator for Current Functional Status.   Therapy/Group: Individual Therapy  SAGUIER,JULIA 04/26/2018, 11:05 AM

## 2018-04-26 NOTE — Progress Notes (Signed)
Occupational Therapy Session Note  Patient Details  Name: Hannah Jefferson MRN: [id number] Date of Birth: 10-22-1956  Today's Date: 04/26/2018 OT Individual Time: 1300-1345 OT Individual Time Calculation (min): 45 min    Short Term Goals: Week 3:  OT Short Term Goal 1 (Week 3): Pt will be able to sit to stand with max A of 1 to prepare for LB self care. OT Short Term Goal 1 - Progress (Week 3): Met OT Short Term Goal 2 (Week 3): Pt will be able to squat pivot to w/c with max A of 1.  OT Short Term Goal 2 - Progress (Week 3): Progressing toward goal OT Short Term Goal 3 (Week 3): Pt will use AE to don pants over feet with mod A. OT Short Term Goal 3 - Progress (Week 3): Met Week 4:  OT Short Term Goal 1 (Week 4): Pt will be able to squat pivot to w/c with max A of 1.  OT Short Term Goal 1 - Progress (Week 4): Progressing toward goal OT Short Term Goal 2 (Week 4): Pt will pull pants over hips with max A for standing in Stedy OT Short Term Goal 2 - Progress (Week 4): Met OT Short Term Goal 3 (Week 4): Pt will don socks using AE with min A OT Short Term Goal 3 - Progress (Week 4): Met OT Short Term Goal 4 (Week 4): Pt will don shoes using AE PRN with min A OT Short Term Goal 4 - Progress (Week 4): Progressing toward goal Week 5:  OT Short Term Goal 1 (Week 5): STG=LTG secondary to ELOS  Skilled Therapeutic Interventions/Progress Updates:    1:1 SElf care retraining at shower level! Pt performed mod A squat pivot transfer w/c to roll in shower chair. Pt perform sit to stand to pull down clothing with mod A and min for balance. Pt able to let go with one hand to assist with clothing but still needed A to complete task. Pt able to bathe with mod A 7out of 10 parts. Pt performed LB threading with Reacher with extra time and then sit to stand with mod A for clothing management with A. Squat pivot with mod A back into w/c. Left at sin with sister in law.   Therapy Documentation Precautions:   Precautions Precautions: Fall, Back Precaution Comments: LUE NWB in splint Restrictions Weight Bearing Restrictions: Yes LUE Weight Bearing: Weight bearing as tolerated RLE Weight Bearing: Weight bearing as tolerated Pain:  no c/o pain   See Function Navigator for Current Functional Status.   Therapy/Group: Individual Therapy  Willeen Cass [facility] 04/26/2018, 2:38 PM

## 2018-04-26 NOTE — Progress Notes (Addendum)
[name]     PROGRESS NOTE    Subjective/Complaints: Had results with bowel program this morning. No further problems after vagal episode yesterday.   ROS: Patient denies fever, rash, sore throat, blurred vision, nausea, vomiting, diarrhea, cough, shortness of breath or chest pain, joint or back pain, headache, or mood change.    Objective: Vital Signs: Blood pressure (!) 110/56, pulse 90, temperature 98.1 F (36.7 C), temperature source Oral, resp. rate 17, height 5\' 4"  (1.626 m), weight 67.6 kg (149 lb), SpO2 96 %. No results found. Recent Labs    04/24/18 0550 04/25/18 1144  WBC 6.2 8.0  HGB 10.5* 10.8*  HCT 31.7* 33.6*  PLT 202 208   Recent Labs    04/24/18 0550 04/25/18 0858  NA 133* 129*  K 4.3 3.9  CL 99* 99*  GLUCOSE 105* 121*  BUN 5* 6  CREATININE 0.47 0.44  CALCIUM 8.9 8.6*   CBG (last 3)  No results for input(s): GLUCAP in the last 72 hours.  Wt Readings from Last 3 Encounters:  04/15/18 67.6 kg (149 lb)  03/20/18 93.4 kg (206 lb)  02/15/18 94.1 kg (207 lb 8 oz)    Physical Exam: (pre syncopal event) Constitutional: No distress . Vital signs reviewed. HEENT: EOMI, oral membranes moist Neck: supple Cardiovascular: RRR without murmur. No JVD    Respiratory: CTA Bilaterally without wheezes or rales. Normal effort    GI: BS +, non-tender, non-distended  Neurological: Motor B/l UE 5/5.  B/l LE 3+-4-/5 HF, KE and 3+/5 ADF. --stable motor exam. Sensory decr 1/2 below level of injury to LT/proprioception without change--stable Skin:warm Psychiatric:  Remains pleasant  Assessment/Plan: 1. Paraplegia and functional deficits secondary to T8 myelopathy, left wrist fracture which require 3+ hours per day of interdisciplinary therapy in a comprehensive inpatient rehab setting. Physiatrist is providing close team supervision and 24 hour management of active medical problems listed below. Physiatrist and rehab team continue  to assess barriers to discharge/monitor patient progress toward functional and medical goals.  Function:  Bathing Bathing position Bathing activity did not occur: N/A Position: Wheelchair/chair at sink  Bathing parts Body parts bathed by patient: Right arm, Left arm, Chest, Abdomen, Front perineal area, Right upper leg, Left upper leg Body parts bathed by helper: Buttocks, Right lower leg, Left lower leg  Bathing assist Assist Level: Assistive device, 2 helpers Assistive Device Comment: reacher    Upper Body Dressing/Undressing Upper body dressing Upper body dressing/undressing activity did not occur: N/A What is the patient wearing?: Pull over shirt/dress Bra - Perfomed by patient: Thread/unthread right bra strap, Thread/unthread left bra strap Bra - Perfomed by helper: Hook/unhook bra (pull down sports bra) Pull over shirt/dress - Perfomed by patient: Thread/unthread right sleeve, Thread/unthread left sleeve, Put head through opening, Pull shirt over trunk Pull over shirt/dress - Perfomed by helper: Put head through opening Button up shirt - Perfomed by patient: Thread/unthread right sleeve, Thread/unthread left sleeve, Button/unbutton shirt Button up shirt - Perfomed by helper: Pull shirt around back    Upper body assist Assist Level: Supervision or verbal cues      Lower Body Dressing/Undressing Lower body dressing Lower body dressing/undressing activity did not occur: N/A What is the patient wearing?: Pants, Socks, Shoes, AFO     Pants- Performed by patient: Thread/unthread left pants leg, Thread/unthread right pants leg Pants- Performed by helper: Pull pants up/down   Non-skid slipper socks- Performed by helper: Don/doff right sock, Don/doff left sock Socks -  Performed by patient: Don/doff right sock, Don/doff left sock Socks - Performed by helper: Don/doff right sock, Don/doff left sock   Shoes - Performed by helper: Don/doff right shoe, Don/doff left shoe, Fasten right,  Fasten left   AFO - Performed by helper: Don/doff left AFO      Lower body assist Assist for lower body dressing: 2 Helpers      Toileting Toileting Toileting activity did not occur: Safety/medical concerns Toileting steps completed by patient: Adjust clothing prior to toileting, Performs perineal hygiene, Adjust clothing after toileting Toileting steps completed by helper: Adjust clothing prior to toileting, Adjust clothing after toileting Toileting Assistive Devices: Other (comment)(stedy/walker)  Toileting assist Assist level: Touching or steadying assistance (Pt.75%)   Transfers Chair/bed transfer   Chair/bed transfer method: Other Chair/bed transfer assist level: dependent (Pt equals 0%) Chair/bed transfer assistive device: Mechanical lift Mechanical lift: Stedy   Locomotion Ambulation Ambulation activity did not occur: Safety/medical concerns   Max distance: 60ft Assist level: 2 helpers(modA gait, w/c follow)   Wheelchair   Type: Manual Max wheelchair distance: 150' Assist Level: Supervision or verbal cues  Cognition Comprehension Comprehension assist level: Follows complex conversation/direction with no assist  Expression Expression assist level: Expresses complex ideas: With no assist  Social Interaction Social Interaction assist level: Interacts appropriately with others - No medications needed.  Problem Solving Problem solving assist level: Solves complex problems: With extra time  Memory Memory assist level: Complete Independence: No helper   Medical Problem List and Plan: 1.Paraplegia and funtional deficitssecondary to T8 meningioma/resection with myeopathy -recent left wrist fracture, now in left wrist splint -Continue CIR  -SNF this week.    2. 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  3. Pain  Management:Hydrocodone prn 4. Mood:LCSW to follow for evaluation and support. 5. Neuropsych: This patientiscapable of making decisions on herown behalf. 6. Skin/Wound Care:Routine pressure relief measures. 7. Fluids/Electrolytes/Nutrition:  -Sodium 129 yesterday (not far from baseline)   -recheck tomorrow      -potassium 3.9---would like to keep above 4     -increase supplement to 25meq TID   -recheck tomorrow  -mg recently 1.9, decreased supp to BID---recheck tomorrow  --reg diet 8. Post op ileus/neurogenic bowel:    Had bm this morning with suppository. More formed  -continue probiotic and fiber to help bulk her up  -continue scheduled dulcolax supp every morning 9.Neurogenic bladder: foley out bladder incontinence  10. GERD:managed withPPIbid. 11. Hyponatremia:  Reslved   -129 today see above 12.Leftwrist fracture: wrist splint  -now almost 8 weeks out. No pain with ROM out of splint---Will check with ortho re: splint removal 13. Dyspepsia  -protonix  -prn maalox 14. Acute lower UTI:  -E Coli  -Keflex changed to fosfomycin x 2 doses 15. Acute blood loss anemia  Hemoglobin 10.5 610 16. Syncopal episode: likely  Vasovagal with BM attempt  -continue to hold lisinopril   -encourage fluids           LOS (Days) Hannah Jefferson EVALUATION WAS PERFORMED  Meredith Staggers, MD 04/26/2018 8:54 AM

## 2018-04-26 NOTE — Progress Notes (Signed)
Physical Therapy Session Note  Patient Details  Name: Hannah Jefferson MRN: [id number] Date of Birth: 08/05/1956  Today's Date: 04/26/2018 PT Individual Time: 1100-1200 PT Individual Time Calculation (min): 60 min   Short Term Goals: Week 4:  PT Short Term Goal 1 (Week 4): =LTGs due to ELOS  Skilled Therapeutic Interventions/Progress Updates:    no c/o pain but reports she's "give out."  Session focus on activity tolerance, and LE/UE strength.  Pt unable to propel w/c due to UE fatigue from shower/ADL session prior to PT session, so sister transported to day room.  Attempted LE therex without ankle weights, alternating hip flexion 3x8-12 reps with poor foot clearance due to fatigue.  Transition to Wii bowling with RUE, 1# wright weight, 2 games of 10 frames each for UE strength and endurance for carry over to w/c propulsion and use of UEs in transfers.  Returned to room at end of session and positioned in bed with stedy, min assist for sit>supine and positioned with call bell in reach and needs met.   Therapy Documentation Precautions:  Precautions Precautions: Fall, Back Precaution Comments: LUE NWB in splint Restrictions Weight Bearing Restrictions: Yes LUE Weight Bearing: Weight bearing as tolerated RLE Weight Bearing: Weight bearing as tolerated   See Function Navigator for Current Functional Status.   Therapy/Group: Individual Therapy   E  04/26/2018, 2:39 PM  

## 2018-04-26 NOTE — Plan of Care (Signed)
Pt continues to require up to max assist for transfers with RW when fatigued.

## 2018-04-26 NOTE — Progress Notes (Signed)
Nutrition Follow-up  DOCUMENTATION CODES:   Obesity unspecified  INTERVENTION:  Continue regular diet.   Encourage adequate PO intake.   NUTRITION DIAGNOSIS:   Inadequate oral intake related to altered GI function as evidenced by (post op ileus, liquid diet); improved  GOAL:   Patient will meet greater than or equal to 90% of their needs; met  MONITOR:   PO intake, Weight trends, Skin, Labs, I & O's  REASON FOR ASSESSMENT:   Low Braden    ASSESSMENT:   62 year old female with history of hypertension, gait disorder with falls weakness and decreased sensation left lower extremity greater than right lower extremity since October 2018. Work-up revealed T7-T8 mass with marked cord compression and large enhancing tail emanating from dural based mass extending around the cord.  She did sustain a fall prior to her evaluation and required ORIF left wrist on 03/04/2018 by Dr. Caralyn Guile.   She was evaluated by [facility] and underwent T7-T8 tumor resection   Meal completion has been 75-100%. Pt has been tolerating her diet. Continue with current regular diet as tolerated. Pt encouraged to eat her food at meals. Plans for SNF this week. Labs and medications reviewed.   Diet Order:   Diet Order           Diet regular Room service appropriate? Yes; Fluid consistency: Thin  Diet effective now          EDUCATION NEEDS:   Not appropriate for education at this time  Skin:  Skin Assessment: Skin Integrity Issues: Skin Integrity Issues:: Incisions Incisions: back, neck  Last BM:  6/12  Height:   Ht Readings from Last 1 Encounters:  03/21/18 '5\' 4"'  (1.626 m)    Weight:   Wt Readings from Last 1 Encounters:  04/15/18 149 lb (67.6 kg)    Ideal Body Weight:  54.5 kg  BMI:  Body mass index is 25.58 kg/m.  Estimated Nutritional Needs:   Kcal:  1850-2000  Protein:  85-100 grams  Fluid:  1.8 - 2 L/day    Corrin Parker, MS, RD, LDN Pager #  [phone] After hours/ weekend pager # [phone]

## 2018-04-26 NOTE — Progress Notes (Signed)
Social Work Patient ID: Hannah Jefferson, female   DOB: May 23, 1956, 62 y.o.   MRN: [id number]  Have received SNF bed offer today from [facility] and they are beginning authorization process with insurance.  Pt aware and very pleased as she had hoped for this facility.  Plan d/c tomorrow via ambulance once we have received SNF auth.  Tesneem Dufrane, LCSW

## 2018-04-26 NOTE — Progress Notes (Signed)
Physical Therapy Discharge Summary  Patient Details  Name: Hannah Jefferson MRN: [id number] Date of Birth: Feb 13, 1956   Patient has met 7 of 8 long term goals due to improved activity tolerance, improved balance, improved postural control, increased strength, decreased pain, functional use of  right lower extremity and left lower extremity and improved coordination.  Patient to discharge at a wheelchair level [place].   Patient's care partner unavailable to provide the necessary physical assistance at discharge. Pt to d/c to SNF for further rehab prior to returning home.   Reasons goals not met: pt can occasionally require as little as min assist for stand/pivot with RW, but dependent on fatigue can require up to max assist for transfers.   Recommendation:   Patient will benefit from ongoing skilled PT services in skilled nursing facility setting to continue to advance safe functional mobility, address ongoing impairments in strength, coordination, motor control, proprioception, balance, activity tolerance, and postural control, and minimize fall risk.  Equipment: No equipment provided  Reasons for discharge: discharge from hospital and transfer to SNF for further rehab  Patient/family agrees with progress made and goals achieved: Yes   PT Discharge Precautions/Restrictions Precautions Precautions: Fall;Back Precaution Comments: LUE WBAT, back precautions but no brace Pain Pain Assessment Pain Scale: 0-10 Vision/Perception  Perception Perception: Within Functional Limits Praxis Praxis: Intact  Cognition Overall Cognitive Status: Within Functional Limits for tasks assessed Arousal/Alertness: Awake/alert Orientation Level: Oriented X4 Memory: Appears intact Awareness: Appears intact Problem Solving: Appears intact Safety/Judgment: Appears intact Sensation Sensation Light Touch: Impaired Detail Light Touch Impaired Details: Impaired LLE;Impaired RLE(ongoing impaired  sensation in BLEs, intact to deep touch throughout) Coordination Gross Motor Movements are Fluid and Coordinated: No Fine Motor Movements are Fluid and Coordinated: Yes Motor  Motor Motor: Paraplegia;Abnormal postural alignment and control;Abnormal tone Motor - Discharge Observations: improving coordination, strength, and muscle endurance, but with ongoing limitations from above  Mobility Bed Mobility Bed Mobility: Supine to Sit;Sit to Supine Supine to Sit: Minimal Assistance - Patient > 75% Sit to Supine: Minimal Assistance - Patient > 75% Transfers Transfers: Stand Pivot Transfers Sit to Stand: Minimal Assistance - Patient > 75%;Moderate Assistance - Patient 50-74%(depending on fatigue level) Sit to Stand Details: Verbal cues for technique;Verbal cues for sequencing;Verbal cues for safe use of DME/AE;Manual facilitation for weight shifting;Tactile cues for posture Stand to Sit: Contact Guard/Touching assist Stand Pivot Transfers: Moderate Assistance - Patient 50 - 74% Stand Pivot Transfer Details: Verbal cues for technique;Verbal cues for precautions/safety;Verbal cues for safe use of DME/AE;Tactile cues for posture;Manual facilitation for placement Transfer (Assistive device): Other (Comment)(stedy) Locomotion  Gait Ambulation: Yes(from previous therapy session) Gait Assistance: Moderate Assistance - Patient 50-74% Ambulation Distance (Feet): 30 Feet Assistive device: Left platform walker Ambulation/Gait Assistance Details: Tactile cues for posture;Tactile cues for placement;Verbal cues for technique;Verbal cues for sequencing;Verbal cues for precautions/safety;Verbal cues for gait pattern;Verbal cues for safe use of DME/AE;Manual facilitation for weight shifting;Manual facilitation for placement Wheelchair Mobility Wheelchair Mobility: Yes Wheelchair Propulsion: Both upper extremities(supervision) Wheelchair Parts Management: Needs assistance Distance: 150  Trunk/Postural  Assessment  Cervical Assessment Cervical Assessment: Within Functional Limits Thoracic Assessment Thoracic Assessment: Exceptions to WFL(mild kyphosis, back precautions) Lumbar Assessment Lumbar Assessment: Within Functional Limits Postural Control Postural Control: (decreased postural endurance)  Balance Balance Balance Assessed: Yes Static Sitting Balance Static Sitting - Level of Assistance: 5: Stand by assistance Dynamic Sitting Balance Dynamic Sitting - Level of Assistance: 5: Stand by assistance Static Standing Balance Static Standing - Balance Support: Bilateral upper  extremity supported;During functional activity Static Standing - Level of Assistance: 4: Min assist Dynamic Standing Balance Dynamic Standing - Balance Support: Bilateral upper extremity supported;During functional activity Dynamic Standing - Level of Assistance: 3: Mod assist Extremity Assessment      RLE Assessment RLE Assessment: Not tested LLE Assessment LLE Assessment: Not tested   See Function Navigator for Current Functional Status.  Michel Santee 04/26/2018, 4:55 PM

## 2018-04-27 ENCOUNTER — Inpatient Hospital Stay (HOSPITAL_COMMUNITY): Payer: 59 | Admitting: Physical Therapy

## 2018-04-27 ENCOUNTER — Inpatient Hospital Stay (HOSPITAL_COMMUNITY): Payer: 59

## 2018-04-27 DIAGNOSIS — N390 Urinary tract infection, site not specified: Secondary | ICD-10-CM

## 2018-04-27 DIAGNOSIS — B9620 Unspecified Escherichia coli [E. coli] as the cause of diseases classified elsewhere: Secondary | ICD-10-CM

## 2018-04-27 LAB — MAGNESIUM: Magnesium: 1.7 mg/dL (ref 1.7–2.4)

## 2018-04-27 LAB — BASIC METABOLIC PANEL
Anion gap: 8 (ref 5–15)
BUN: 5 mg/dL — ABNORMAL LOW (ref 6–20)
CO2: 27 mmol/L (ref 22–32)
Calcium: 8.8 mg/dL — ABNORMAL LOW (ref 8.9–10.3)
Chloride: 100 mmol/L — ABNORMAL LOW (ref 101–111)
Creatinine, Ser: 0.48 mg/dL (ref 0.44–1.00)
GFR calc Af Amer: >60 mL/min (ref >60)
GFR calc non Af Amer: >60 mL/min (ref >60)
Glucose, Bld: 101 mg/dL — ABNORMAL HIGH (ref 65–99)
Potassium: 3.9 mmol/L (ref 3.5–5.1)
Sodium: 135 mmol/L (ref 135–145)

## 2018-04-27 MED ORDER — POTASSIUM CHLORIDE CRYS ER 20 MEQ PO TBCR
40.0000 meq | EXTENDED_RELEASE_TABLET | Freq: Two times a day (BID) | ORAL | Status: DC
  Administered 2018-04-27 – 2018-04-28 (×2): 40 meq via ORAL
  Filled 2018-04-27 (×2): qty 2

## 2018-04-27 MED ORDER — POTASSIUM CHLORIDE CRYS ER 20 MEQ PO TBCR: 40.0000 meq | EXTENDED_RELEASE_TABLET | Freq: Two times a day (BID) | ORAL | Status: DC

## 2018-04-27 MED ORDER — MAGNESIUM OXIDE 400 (241.3 MG) MG PO TABS
400.0000 mg | ORAL_TABLET | Freq: Two times a day (BID) | ORAL | Status: DC
  Administered 2018-04-27 – 2018-04-28 (×2): 400 mg via ORAL
  Filled 2018-04-27 (×2): qty 1

## 2018-04-27 MED ORDER — MECLIZINE HCL 25 MG PO TABS
25.0000 mg | ORAL_TABLET | Freq: Once | ORAL | Status: AC
  Administered 2018-04-27: 25 mg via ORAL
  Filled 2018-04-27: qty 1

## 2018-04-27 NOTE — Progress Notes (Signed)
Social Work Patient ID: Hannah Jefferson, female   DOB: 07-26-1956, 62 y.o.   MRN: [id number]  Have still not yet received insurance authorization for SNF, therefore, will hold d/c until tomorrow.   Pt very disappointed but understanding.    Chilton Sallade, LCSW

## 2018-04-27 NOTE — Progress Notes (Signed)
Occupational Therapy Discharge Summary  Patient Details  Name: Hannah Jefferson MRN: [id number] Date of Birth: September 27, 1956  Patient has met 8 of 8 long term goals due to improved activity tolerance, improved balance, postural control, ability to compensate for deficits, functional use of  RIGHT lower, LEFT upper and LEFT lower extremity and improved coordination.  Pt progress with BADLs was slow during this admission.  Pt requires min A for functional transfers with RW, LB dressing tasks, bathing tasks, and toileting tasks.  Pt's say in CIR has been complicated by ongoing medical issues which has hindered progress.  Pt's sister-in-law has been present for most therapy sessions.  Pt continues to fatigue easily requiring multiple rest breaks during therapy session and throughout the day. Patient to discharge at [facility] Assist level.  Patient's care partner is independent to provide the necessary physical assistance at discharge.      Recommendation:  Patient will benefit from ongoing skilled OT services in skilled nursing facility setting to continue to advance functional skills in the area of BADL and Reduce care partner burden.  Equipment: No equipment provided Discharge to SNF  Reasons for discharge: treatment goals met and discharge from hospital  Patient/family agrees with progress made and goals achieved: Yes  OT Discharge Vision Baseline Vision/History: Wears glasses Wears Glasses: Reading only Patient Visual Report: No change from baseline Vision Assessment?: No apparent visual deficits Perception  Perception: Within Functional Limits Praxis Praxis: Intact Cognition Overall Cognitive Status: Within Functional Limits for tasks assessed Arousal/Alertness: Awake/alert Orientation Level: Oriented X4 Memory: Appears intact Awareness: Appears intact Problem Solving: Appears intact Safety/Judgment: Appears intact Sensation Sensation Light Touch: Impaired Detail Light Touch  Impaired Details: Impaired LLE;Impaired RLE Hot/Cold: Appears Intact Proprioception: Appears Intact Stereognosis: Appears Intact Coordination Gross Motor Movements are Fluid and Coordinated: Yes Fine Motor Movements are Fluid and Coordinated: Yes Finger Nose Finger Test: not tested Motor  Motor Motor: Paraplegia;Abnormal postural alignment and control;Abnormal tone Motor - Discharge Observations: improving coordination, strength, and muscle endurance, but with ongoing limitations from above     Trunk/Postural Assessment  Cervical Assessment Cervical Assessment: Within Functional Limits Thoracic Assessment Thoracic Assessment: Exceptions to WFL(mild kyphosis; back precautions) Lumbar Assessment Lumbar Assessment: Within Functional Limits Postural Control Postural Control: (decreased postural endurance)  Balance Static Sitting Balance Static Sitting - Level of Assistance: 6: Modified independent (Device/Increase time) Dynamic Sitting Balance Dynamic Sitting - Level of Assistance: 5: Stand by assistance Extremity/Trunk Assessment RUE Assessment RUE Assessment: Within Functional Limits LUE Assessment LUE Assessment: Exceptions to WFL(shoulder WFL; wrist splint)   See Function Navigator for Current Functional Status.  Leotis Shames [facility] 04/27/2018, 12:04 PM

## 2018-04-27 NOTE — Progress Notes (Signed)
Occupational Therapy Session Note  Patient Details  Name: Hannah Jefferson MRN: [id number] Date of Birth: January 13, 1956  Today's Date: 04/27/2018 OT Individual Time: 0800-0900 OT Individual Time Calculation (min): 60 min    Short Term Goals: Week 5:  OT Short Term Goal 1 (Week 5): STG=LTG secondary to ELOS  Skilled Therapeutic Interventions/Progress Updates:    OT intervention with focus on bed mobility, bathing seated on BSC, dressing with sit<>stand, functional transfers, and safety awareness in preparation for discharge to SNF later in day.  Pt sat EOB with supervision and performed stand pivot transfer with RW at min A level.  Pt required min A for sit<>stand from EOB and BSC. Pt able to thread pants and pull up pants with min A for standing balance. Pt performed perineal hygiene with steady A for standing balance.  Pt completed grooming tasks seated in w/c at sink.  Pt remained in w/c with all needs within reach and husband present.   Therapy Documentation Precautions:  Precautions Precautions: Fall, Back Precaution Comments: LUE WBAT, back precautions but no brace Restrictions Weight Bearing Restrictions: Yes LUE Weight Bearing: Weight bearing as tolerated RLE Weight Bearing: Weight bearing as tolerated   Pain:  Pt denies pain  See Function Navigator for Current Functional Status.   Therapy/Group: Individual Therapy  [name] 04/27/2018, 11:55 AM

## 2018-04-27 NOTE — Progress Notes (Signed)
[facility] PHYSICAL MEDICINE & REHABILITATION     PROGRESS NOTE    Subjective/Complaints: No new issues. Still without bm this morning. Anxious about transfer today  ROS: Patient denies fever, rash, sore throat, blurred vision, nausea, vomiting, diarrhea, cough, shortness of breath or chest pain, joint or back pain, headache, or mood change.    Objective: Vital Signs: Blood pressure 109/66, pulse 94, temperature 98.4 F (36.9 C), resp. rate 18, height 5\' 4"  (1.626 m), weight 67.6 kg (149 lb), SpO2 96 %. No results found. Recent Labs    04/25/18 1144  WBC 8.0  HGB 10.8*  HCT 33.6*  PLT 208   Recent Labs    04/25/18 0858 04/27/18 0705  NA 129* 135  K 3.9 3.9  CL 99* 100*  GLUCOSE 121* 101*  BUN 6 5*  CREATININE 0.44 0.48  CALCIUM 8.6* 8.8*   CBG (last 3)  No results for input(s): GLUCAP in the last 72 hours.  Wt Readings from Last 3 Encounters:  04/15/18 67.6 kg (149 lb)  03/20/18 93.4 kg (206 lb)  02/15/18 94.1 kg (207 lb 8 oz)    Physical Exam: (pre syncopal event) Constitutional: No distress . Vital signs reviewed. HEENT: EOMI, oral membranes moist Neck: supple Cardiovascular: RRR without murmur. No JVD    Respiratory: CTA Bilaterally without wheezes or rales. Normal effort    GI: BS +, non-tender, non-distended  Neurological: Motor B/l UE 5/5.  B/l LE 3+-4-/5 HF, KE and 3+/5 ADF. --stable motor exam. Sensory decr 1/2 below level of injury to LT/proprioception without change--stable Skin:warm Psychiatric:  Remains pleasant  Assessment/Plan: 1. Paraplegia and functional deficits secondary to T8 myelopathy, left wrist fracture which require 3+ hours per day of interdisciplinary therapy in a comprehensive inpatient rehab setting. Physiatrist is providing close team supervision and 24 hour management of active medical problems listed below. Physiatrist and rehab team continue to assess barriers to discharge/monitor patient progress toward functional and  medical goals.  Function:  Bathing Bathing position Bathing activity did not occur: N/A Position: Shower  Bathing parts Body parts bathed by patient: Right arm, Left arm, Chest, Abdomen, Front perineal area, Right upper leg, Left upper leg Body parts bathed by helper: Right lower leg, Left lower leg, Back  Bathing assist Assist Level: Set up, More than reasonable time Assistive Device Comment: reacher Set up : To obtain items, To open containers  Upper Body Dressing/Undressing Upper body dressing Upper body dressing/undressing activity did not occur: N/A What is the patient wearing?: Pull over shirt/dress Bra - Perfomed by patient: Thread/unthread right bra strap, Thread/unthread left bra strap Bra - Perfomed by helper: Hook/unhook bra (pull down sports bra) Pull over shirt/dress - Perfomed by patient: Thread/unthread right sleeve, Thread/unthread left sleeve, Put head through opening, Pull shirt over trunk Pull over shirt/dress - Perfomed by helper: Put head through opening Button up shirt - Perfomed by patient: Thread/unthread right sleeve, Thread/unthread left sleeve, Button/unbutton shirt Button up shirt - Perfomed by helper: Pull shirt around back    Upper body assist Assist Level: Set up   Set up : To obtain clothing/put away  Lower Body Dressing/Undressing Lower body dressing Lower body dressing/undressing activity did not occur: N/A What is the patient wearing?: Underwear, Pants, AFO, [employer], Shoes Underwear - Performed by patient: Thread/unthread right underwear leg, Thread/unthread left underwear leg Underwear - Performed by helper: Pull underwear up/down Pants- Performed by patient: Thread/unthread right pants leg, Thread/unthread left pants leg Pants- Performed by helper: Pull pants up/down  Non-skid slipper socks- Performed by helper: Don/doff right sock, Don/doff left sock Socks - Performed by patient: Don/doff right sock, Don/doff left sock Socks - Performed by  helper: Don/doff right sock, Don/doff left sock   Shoes - Performed by helper: Don/doff right shoe, Don/doff left shoe, Fasten right, Fasten left   AFO - Performed by helper: Don/doff left AFO   TED Hose - Performed by helper: Don/doff right TED hose, Don/doff left TED hose  Lower body assist Assist for lower body dressing: Touching or steadying assistance (Pt > 75%)      Toileting Toileting Toileting activity did not occur: Safety/medical concerns Toileting steps completed by patient: Adjust clothing prior to toileting, Performs perineal hygiene, Adjust clothing after toileting Toileting steps completed by helper: Performs perineal hygiene, Adjust clothing prior to toileting, Adjust clothing after toileting Toileting Assistive Devices: Other (comment)(stedy/walker)  Toileting assist Assist level: Touching or steadying assistance (Pt.75%)   Transfers Chair/bed transfer   Chair/bed transfer method: Stand pivot Chair/bed transfer assist level: 2 helpers Chair/bed transfer assistive device: Environmental manager lift: Architectural technologist activity did not occur: Safety/medical concerns   Max distance: 35ft Assist level: 2 helpers(modA gait, w/c follow)   Wheelchair   Type: Manual Max wheelchair distance: 150' Assist Level: Supervision or verbal cues  Cognition Comprehension Comprehension assist level: Follows complex conversation/direction with no assist  Expression Expression assist level: Expresses complex ideas: With no assist  Social Interaction Social Interaction assist level: Interacts appropriately with others - No medications needed.  Problem Solving Problem solving assist level: Solves complex problems: With extra time  Memory Memory assist level: Complete Independence: No helper   Medical Problem List and Plan: 1.Paraplegia and funtional deficitssecondary to T8 meningioma/resection with myeopathy -recent left wrist fracture, now in  left wrist splint  -SNF today. Follow up with me in about 3-4 weeks.    2. 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  3. Pain Management:Hydrocodone prn 4. Mood:LCSW to follow for evaluation and support. 5. Neuropsych: This patientiscapable of making decisions on herown behalf. 6. Skin/Wound Care:Routine pressure relief measures. 7. Fluids/Electrolytes/Nutrition:  -Sodium 129 yesterday (not far from baseline)   -recheck tomorrow      -potassium 3.9---would like to keep above 4     -increase supplement to 65meq bid   -recheck next week  -mg down to 1.7, increase supp back to bid  --reg diet 8. Post op ileus/neurogenic bowel:    Had bm this morning with suppository. More formed  -continue probiotic and fiber to help bulk her up  -continue scheduled dulcolax supp every morning 9.Neurogenic bladder: foley out bladder incontinence  10. GERD:managed withPPIbid. 11. Hyponatremia:  Reslved   -129 today see above 12.Leftwrist fracture: wrist splint  -now almost 8 weeks out. No pain with ROM out of splint---wear splint prn 13. Dyspepsia  -protonix  -prn maalox 14. Acute lower UTI:  -E Coli  -Keflex changed to fosfomycin x 2 doses 15. Acute blood loss anemia  Hemoglobin 10.5 610 16. Syncopal episode: likely  Vasovagal with BM attempt  -continue to hold lisinopril   -encourage fluids           LOS (Days) [place] EVALUATION WAS PERFORMED  Meredith Staggers, MD 04/27/2018 8:39 AM

## 2018-04-27 NOTE — Progress Notes (Signed)
Responded to scc to support patient.  Patient expressed having a lot of depression but is feeling better today.  Patient being d/c to [facility] Today. Provided emotional support.  Jaclynn Major, [place], [facility], Pager [phone]

## 2018-04-28 DIAGNOSIS — N319 Neuromuscular dysfunction of bladder, unspecified: Secondary | ICD-10-CM | POA: Diagnosis not present

## 2018-04-28 DIAGNOSIS — Z7401 Bed confinement status: Secondary | ICD-10-CM | POA: Diagnosis not present

## 2018-04-28 DIAGNOSIS — K567 Ileus, unspecified: Secondary | ICD-10-CM | POA: Diagnosis not present

## 2018-04-28 DIAGNOSIS — M4714 Other spondylosis with myelopathy, thoracic region: Secondary | ICD-10-CM | POA: Diagnosis not present

## 2018-04-28 DIAGNOSIS — M2550 Pain in unspecified joint: Secondary | ICD-10-CM | POA: Diagnosis not present

## 2018-04-28 DIAGNOSIS — G992 Myelopathy in diseases classified elsewhere: Secondary | ICD-10-CM | POA: Diagnosis not present

## 2018-04-28 DIAGNOSIS — R000 Tachycardia, unspecified: Secondary | ICD-10-CM | POA: Diagnosis not present

## 2018-04-28 DIAGNOSIS — R600 Localized edema: Secondary | ICD-10-CM | POA: Diagnosis not present

## 2018-04-28 DIAGNOSIS — A499 Bacterial infection, unspecified: Secondary | ICD-10-CM | POA: Diagnosis not present

## 2018-04-28 DIAGNOSIS — I82409 Acute embolism and thrombosis of unspecified deep veins of unspecified lower extremity: Secondary | ICD-10-CM | POA: Diagnosis not present

## 2018-04-28 DIAGNOSIS — I10 Essential (primary) hypertension: Secondary | ICD-10-CM | POA: Diagnosis not present

## 2018-04-28 DIAGNOSIS — K9189 Other postprocedural complications and disorders of digestive system: Secondary | ICD-10-CM | POA: Diagnosis not present

## 2018-04-28 DIAGNOSIS — K592 Neurogenic bowel, not elsewhere classified: Secondary | ICD-10-CM | POA: Diagnosis not present

## 2018-04-28 DIAGNOSIS — K5900 Constipation, unspecified: Secondary | ICD-10-CM | POA: Diagnosis not present

## 2018-04-28 DIAGNOSIS — R261 Paralytic gait: Secondary | ICD-10-CM | POA: Diagnosis not present

## 2018-04-28 DIAGNOSIS — H53451 Other localized visual field defect, right eye: Secondary | ICD-10-CM | POA: Diagnosis not present

## 2018-04-28 DIAGNOSIS — S52352S Displaced comminuted fracture of shaft of radius, left arm, sequela: Secondary | ICD-10-CM | POA: Diagnosis not present

## 2018-04-28 DIAGNOSIS — G8220 Paraplegia, unspecified: Secondary | ICD-10-CM | POA: Diagnosis not present

## 2018-04-28 DIAGNOSIS — R110 Nausea: Secondary | ICD-10-CM | POA: Diagnosis not present

## 2018-04-28 DIAGNOSIS — N390 Urinary tract infection, site not specified: Secondary | ICD-10-CM | POA: Diagnosis not present

## 2018-04-28 DIAGNOSIS — S52352D Displaced comminuted fracture of shaft of radius, left arm, subsequent encounter for closed fracture with routine healing: Secondary | ICD-10-CM | POA: Diagnosis not present

## 2018-04-28 MED ORDER — MAGNESIUM OXIDE 400 (241.3 MG) MG PO TABS: 400.0000 mg | ORAL_TABLET | Freq: Two times a day (BID) | ORAL | Status: DC

## 2018-04-28 NOTE — Progress Notes (Signed)
Called to [facility] and gave report to [place], Marine scientist. Pt does not seem to be in any distress. Vitals are stable. PRN and scheduled meds given. EMS here to pick up pt.

## 2018-04-28 NOTE — Progress Notes (Signed)
Social Work  Discharge Note  The overall goal for the admission was met for:   Discharge location: No - plan changed to SNF as family unable to provide needed level of care 24/7  Length of Stay: Yes - 38 days  Discharge activity level: Yes - min assist w/c level overall  Home/community participation: Yes  Services provided included: MD, RD, PT, OT, RN, TR, Pharmacy, Neuropsych and SW  Financial Services: Private Insurance: [employer]  Follow-up services arranged: Other: SNF at [facility]  Comments (or additional information):  Patient/Family verbalized understanding of follow-up arrangements: Yes  Individual responsible for coordination of the follow-up plan: pt  Confirmed correct DME delivered: NA    ,  

## 2018-04-28 NOTE — Progress Notes (Signed)
[facility] PHYSICAL MEDICINE & REHABILITATION     PROGRESS NOTE    Subjective/Complaints: Up eating a breakfast sandwich. No new issues. Had 2 bm's this morning  ROS: Patient denies fever, rash, sore throat, blurred vision, nausea, vomiting, diarrhea, cough, shortness of breath or chest pain, joint or back pain, headache, or mood change.   Objective: Vital Signs: Blood pressure 110/67, pulse 87, temperature 98 F (36.7 C), temperature source Oral, resp. rate 17, height 5\' 4"  (1.626 m), weight 67.6 kg (149 lb), SpO2 97 %. No results found. Recent Labs    04/25/18 1144  WBC 8.0  HGB 10.8*  HCT 33.6*  PLT 208   Recent Labs    04/27/18 0705  NA 135  K 3.9  CL 100*  GLUCOSE 101*  BUN 5*  CREATININE 0.48  CALCIUM 8.8*   CBG (last 3)  No results for input(s): GLUCAP in the last 72 hours.  Wt Readings from Last 3 Encounters:  04/15/18 67.6 kg (149 lb)  03/20/18 93.4 kg (206 lb)  02/15/18 94.1 kg (207 lb 8 oz)    Physical Exam: (pre syncopal event) Constitutional: No distress . Vital signs reviewed. HEENT: EOMI, oral membranes moist Neck: supple Cardiovascular: RRR without murmur. No JVD    Respiratory: CTA Bilaterally without wheezes or rales. Normal effort    GI: BS +, non-tender, non-distended  Neurological: Motor B/l UE 5/5.  B/l LE 3+-4-/5 HF, KE and 3+/5 ADF. --stable motor exam. Sensory decr 1/2 below level of injury to LT/proprioception without change--no change Skin:warm Psychiatric:  Remains pleasant  Assessment/Plan: 1. Paraplegia and functional deficits secondary to T8 myelopathy, left wrist fracture which require 3+ hours per day of interdisciplinary therapy in a comprehensive inpatient rehab setting. Physiatrist is providing close team supervision and 24 hour management of active medical problems listed below. Physiatrist and rehab team continue to assess barriers to discharge/monitor patient progress toward functional and medical  goals.  Function:  Bathing Bathing position Bathing activity did not occur: N/A Position: Wheelchair/chair at sink  Bathing parts Body parts bathed by patient: Right arm, Left arm, Chest, Abdomen, Front perineal area, Right upper leg, Left upper leg Body parts bathed by helper: Right lower leg, Left lower leg, Back  Bathing assist Assist Level: Touching or steadying assistance(Pt > 75%) Assistive Device Comment: reacher Set up : To obtain items, To open containers  Upper Body Dressing/Undressing Upper body dressing Upper body dressing/undressing activity did not occur: N/A What is the patient wearing?: Bra, Pull over shirt/dress Bra - Perfomed by patient: Thread/unthread right bra strap, Thread/unthread left bra strap, Hook/unhook bra (pull down sports bra) Bra - Perfomed by helper: Hook/unhook bra (pull down sports bra) Pull over shirt/dress - Perfomed by patient: Thread/unthread right sleeve, Thread/unthread left sleeve, Put head through opening, Pull shirt over trunk Pull over shirt/dress - Perfomed by helper: Put head through opening Button up shirt - Perfomed by patient: Thread/unthread right sleeve, Thread/unthread left sleeve, Button/unbutton shirt Button up shirt - Perfomed by helper: Pull shirt around back    Upper body assist Assist Level: Set up   Set up : To obtain clothing/put away  Lower Body Dressing/Undressing Lower body dressing Lower body dressing/undressing activity did not occur: N/A What is the patient wearing?: Underwear, Pants, AFO, Shoes, Socks Underwear - Performed by patient: Thread/unthread right underwear leg, Thread/unthread left underwear leg Underwear - Performed by helper: Pull underwear up/down Pants- Performed by patient: Thread/unthread right pants leg, Thread/unthread left pants leg, Pull pants up/down Pants-  Performed by helper: Pull pants up/down   Non-skid slipper socks- Performed by helper: Don/doff right sock, Don/doff left sock Socks -  Performed by patient: Don/doff right sock, Don/doff left sock Socks - Performed by helper: Don/doff right sock, Don/doff left sock Shoes - Performed by patient: Don/doff right shoe, Don/doff left shoe Shoes - Performed by helper: Fasten right, Fasten left   AFO - Performed by helper: Don/doff left AFO   TED Hose - Performed by helper: Don/doff right TED hose, Don/doff left TED hose  Lower body assist Assist for lower body dressing: Touching or steadying assistance (Pt > 75%)      Toileting Toileting Toileting activity did not occur: Safety/medical concerns Toileting steps completed by patient: Adjust clothing prior to toileting, Performs perineal hygiene, Adjust clothing after toileting Toileting steps completed by helper: Performs perineal hygiene, Adjust clothing prior to toileting, Adjust clothing after toileting Toileting Assistive Devices: Grab bar or rail(walker)  Toileting assist Assist level: Touching or steadying assistance (Pt.75%)   Transfers Chair/bed transfer   Chair/bed transfer method: Stand pivot Chair/bed transfer assist level: 2 helpers Chair/bed transfer assistive device: Environmental manager lift: Architectural technologist activity did not occur: Safety/medical concerns   Max distance: 38ft Assist level: 2 helpers(modA gait, w/c follow)   Wheelchair   Type: Manual Max wheelchair distance: 150' Assist Level: Supervision or verbal cues  Cognition Comprehension Comprehension assist level: Follows complex conversation/direction with no assist  Expression Expression assist level: Expresses complex ideas: With no assist  Social Interaction Social Interaction assist level: Interacts appropriately with others - No medications needed.  Problem Solving Problem solving assist level: Solves complex problems: With extra time  Memory Memory assist level: Complete Independence: No helper   Medical Problem List and Plan: 1.Paraplegia and funtional  deficitssecondary to T8 meningioma/resection with myeopathy -recent left wrist fracture, now in left wrist splint  -SNF transfer hopefully today. Follow up with me in about 3-4 weeks at office.    2. 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  3. Pain Management:Hydrocodone prn 4. Mood:LCSW to follow for evaluation and support. 5. Neuropsych: This patientiscapable of making decisions on herown behalf. 6. Skin/Wound Care:Routine pressure relief measures. 7. Fluids/Electrolytes/Nutrition:  -Sodium 129 yesterday (not far from baseline)   -recheck tomorrow      -potassium 3.9---would like to keep above 4     -increase supplement to 43meq bid   -recheck next week  -mg down to 1.7, increase supp back to bid  --reg diet 8. Post op ileus/neurogenic bowel:    -bowels becoming more regulated. Discussed importance of balancing dietary intake with meds to create ideal consistency and timing until she has voluntary control again  -continue probiotic and fiber to help bulk her up  -continue scheduled dulcolax supp every morning 9.Neurogenic bladder: foley out bladder incontinence  10. GERD:managed withPPIbid. 11. Hyponatremia:  Reslved   -129 today see above 12.Leftwrist fracture: wrist splint  -now almost 8 weeks out. May wear splint prn 13. Dyspepsia  -protonix  -prn maalox 14. Acute lower UTI:  -E Coli  -Keflex changed to fosfomycin x 2 doses 15. Acute blood loss anemia  Hemoglobin 10.5 610 16. Syncopal episode: likely  Vasovagal with BM attempt  -continue to hold lisinopril   -encourage fluids           LOS (Days) 38 A FACE TO FACE EVALUATION WAS  PERFORMED  Meredith Staggers, MD 04/28/2018 10:57 AM

## 2018-05-01 DIAGNOSIS — I10 Essential (primary) hypertension: Secondary | ICD-10-CM | POA: Diagnosis not present

## 2018-05-01 DIAGNOSIS — M4714 Other spondylosis with myelopathy, thoracic region: Secondary | ICD-10-CM | POA: Diagnosis not present

## 2018-05-02 DIAGNOSIS — R600 Localized edema: Secondary | ICD-10-CM | POA: Diagnosis not present

## 2018-05-02 DIAGNOSIS — R110 Nausea: Secondary | ICD-10-CM | POA: Diagnosis not present

## 2018-05-02 DIAGNOSIS — I82409 Acute embolism and thrombosis of unspecified deep veins of unspecified lower extremity: Secondary | ICD-10-CM | POA: Diagnosis not present

## 2018-05-02 DIAGNOSIS — R261 Paralytic gait: Secondary | ICD-10-CM | POA: Diagnosis not present

## 2018-05-02 DIAGNOSIS — G8220 Paraplegia, unspecified: Secondary | ICD-10-CM | POA: Diagnosis not present

## 2018-05-03 DIAGNOSIS — I82409 Acute embolism and thrombosis of unspecified deep veins of unspecified lower extremity: Secondary | ICD-10-CM | POA: Diagnosis not present

## 2018-05-03 DIAGNOSIS — R110 Nausea: Secondary | ICD-10-CM | POA: Diagnosis not present

## 2018-05-04 DIAGNOSIS — G8220 Paraplegia, unspecified: Secondary | ICD-10-CM | POA: Diagnosis not present

## 2018-05-04 DIAGNOSIS — R110 Nausea: Secondary | ICD-10-CM | POA: Diagnosis not present

## 2018-05-04 DIAGNOSIS — R261 Paralytic gait: Secondary | ICD-10-CM | POA: Diagnosis not present

## 2018-05-04 DIAGNOSIS — I82409 Acute embolism and thrombosis of unspecified deep veins of unspecified lower extremity: Secondary | ICD-10-CM | POA: Diagnosis not present

## 2018-05-08 DIAGNOSIS — K592 Neurogenic bowel, not elsewhere classified: Secondary | ICD-10-CM | POA: Diagnosis not present

## 2018-05-08 DIAGNOSIS — G8220 Paraplegia, unspecified: Secondary | ICD-10-CM | POA: Diagnosis not present

## 2018-05-08 DIAGNOSIS — K5900 Constipation, unspecified: Secondary | ICD-10-CM | POA: Diagnosis not present

## 2018-05-08 DIAGNOSIS — R000 Tachycardia, unspecified: Secondary | ICD-10-CM | POA: Diagnosis not present

## 2018-05-08 DIAGNOSIS — R261 Paralytic gait: Secondary | ICD-10-CM | POA: Diagnosis not present

## 2018-05-10 DIAGNOSIS — G8220 Paraplegia, unspecified: Secondary | ICD-10-CM | POA: Diagnosis not present

## 2018-05-10 DIAGNOSIS — K592 Neurogenic bowel, not elsewhere classified: Secondary | ICD-10-CM | POA: Diagnosis not present

## 2018-05-10 DIAGNOSIS — R261 Paralytic gait: Secondary | ICD-10-CM | POA: Diagnosis not present

## 2018-05-16 DIAGNOSIS — H53451 Other localized visual field defect, right eye: Secondary | ICD-10-CM | POA: Diagnosis not present

## 2018-05-16 DIAGNOSIS — R261 Paralytic gait: Secondary | ICD-10-CM | POA: Diagnosis not present

## 2018-05-16 DIAGNOSIS — G8220 Paraplegia, unspecified: Secondary | ICD-10-CM | POA: Diagnosis not present

## 2018-05-16 DIAGNOSIS — K592 Neurogenic bowel, not elsewhere classified: Secondary | ICD-10-CM | POA: Diagnosis not present

## 2018-05-19 DIAGNOSIS — G8220 Paraplegia, unspecified: Secondary | ICD-10-CM | POA: Diagnosis not present

## 2018-05-19 DIAGNOSIS — R261 Paralytic gait: Secondary | ICD-10-CM | POA: Diagnosis not present

## 2018-05-19 DIAGNOSIS — K592 Neurogenic bowel, not elsewhere classified: Secondary | ICD-10-CM | POA: Diagnosis not present

## 2018-05-21 DIAGNOSIS — I82411 Acute embolism and thrombosis of right femoral vein: Secondary | ICD-10-CM | POA: Diagnosis not present

## 2018-05-21 DIAGNOSIS — S62102D Fracture of unspecified carpal bone, left wrist, subsequent encounter for fracture with routine healing: Secondary | ICD-10-CM | POA: Diagnosis not present

## 2018-05-21 DIAGNOSIS — M4714 Other spondylosis with myelopathy, thoracic region: Secondary | ICD-10-CM | POA: Diagnosis not present

## 2018-05-23 DIAGNOSIS — S62102D Fracture of unspecified carpal bone, left wrist, subsequent encounter for fracture with routine healing: Secondary | ICD-10-CM | POA: Diagnosis not present

## 2018-05-23 DIAGNOSIS — M4714 Other spondylosis with myelopathy, thoracic region: Secondary | ICD-10-CM | POA: Diagnosis not present

## 2018-05-23 DIAGNOSIS — I82411 Acute embolism and thrombosis of right femoral vein: Secondary | ICD-10-CM | POA: Diagnosis not present

## 2018-05-25 DIAGNOSIS — S52522D Torus fracture of lower end of left radius, subsequent encounter for fracture with routine healing: Secondary | ICD-10-CM | POA: Diagnosis not present

## 2018-05-26 DIAGNOSIS — M4714 Other spondylosis with myelopathy, thoracic region: Secondary | ICD-10-CM | POA: Diagnosis not present

## 2018-05-26 DIAGNOSIS — S62102D Fracture of unspecified carpal bone, left wrist, subsequent encounter for fracture with routine healing: Secondary | ICD-10-CM | POA: Diagnosis not present

## 2018-05-26 DIAGNOSIS — I82411 Acute embolism and thrombosis of right femoral vein: Secondary | ICD-10-CM | POA: Diagnosis not present

## 2018-05-30 ENCOUNTER — Encounter: Payer: 59 | Attending: Physical Medicine & Rehabilitation | Admitting: Physical Medicine & Rehabilitation

## 2018-05-30 ENCOUNTER — Encounter: Payer: Self-pay | Admitting: Physical Medicine & Rehabilitation

## 2018-05-30 VITALS — BP 148/94 | HR 96 | Ht 64.0 in | Wt 190.0 lb

## 2018-05-30 DIAGNOSIS — N319 Neuromuscular dysfunction of bladder, unspecified: Secondary | ICD-10-CM | POA: Insufficient documentation

## 2018-05-30 DIAGNOSIS — Z8249 Family history of ischemic heart disease and other diseases of the circulatory system: Secondary | ICD-10-CM | POA: Insufficient documentation

## 2018-05-30 DIAGNOSIS — K592 Neurogenic bowel, not elsewhere classified: Secondary | ICD-10-CM | POA: Diagnosis not present

## 2018-05-30 DIAGNOSIS — Z8349 Family history of other endocrine, nutritional and metabolic diseases: Secondary | ICD-10-CM | POA: Insufficient documentation

## 2018-05-30 DIAGNOSIS — F329 Major depressive disorder, single episode, unspecified: Secondary | ICD-10-CM | POA: Insufficient documentation

## 2018-05-30 DIAGNOSIS — Z818 Family history of other mental and behavioral disorders: Secondary | ICD-10-CM | POA: Diagnosis not present

## 2018-05-30 DIAGNOSIS — H539 Unspecified visual disturbance: Secondary | ICD-10-CM | POA: Insufficient documentation

## 2018-05-30 DIAGNOSIS — S62102D Fracture of unspecified carpal bone, left wrist, subsequent encounter for fracture with routine healing: Secondary | ICD-10-CM | POA: Diagnosis not present

## 2018-05-30 DIAGNOSIS — Z9049 Acquired absence of other specified parts of digestive tract: Secondary | ICD-10-CM | POA: Diagnosis not present

## 2018-05-30 DIAGNOSIS — G8220 Paraplegia, unspecified: Secondary | ICD-10-CM | POA: Diagnosis not present

## 2018-05-30 DIAGNOSIS — I82402 Acute embolism and thrombosis of unspecified deep veins of left lower extremity: Secondary | ICD-10-CM | POA: Diagnosis not present

## 2018-05-30 DIAGNOSIS — Z825 Family history of asthma and other chronic lower respiratory diseases: Secondary | ICD-10-CM | POA: Diagnosis not present

## 2018-05-30 DIAGNOSIS — I10 Essential (primary) hypertension: Secondary | ICD-10-CM | POA: Insufficient documentation

## 2018-05-30 DIAGNOSIS — G8929 Other chronic pain: Secondary | ICD-10-CM | POA: Insufficient documentation

## 2018-05-30 DIAGNOSIS — I82411 Acute embolism and thrombosis of right femoral vein: Secondary | ICD-10-CM | POA: Diagnosis not present

## 2018-05-30 DIAGNOSIS — Z833 Family history of diabetes mellitus: Secondary | ICD-10-CM | POA: Insufficient documentation

## 2018-05-30 DIAGNOSIS — K219 Gastro-esophageal reflux disease without esophagitis: Secondary | ICD-10-CM | POA: Diagnosis not present

## 2018-05-30 DIAGNOSIS — Z7901 Long term (current) use of anticoagulants: Secondary | ICD-10-CM | POA: Insufficient documentation

## 2018-05-30 DIAGNOSIS — G629 Polyneuropathy, unspecified: Secondary | ICD-10-CM | POA: Diagnosis not present

## 2018-05-30 DIAGNOSIS — M4714 Other spondylosis with myelopathy, thoracic region: Secondary | ICD-10-CM

## 2018-05-30 DIAGNOSIS — H9193 Unspecified hearing loss, bilateral: Secondary | ICD-10-CM | POA: Insufficient documentation

## 2018-05-30 DIAGNOSIS — F419 Anxiety disorder, unspecified: Secondary | ICD-10-CM | POA: Diagnosis not present

## 2018-05-30 DIAGNOSIS — M25552 Pain in left hip: Secondary | ICD-10-CM | POA: Insufficient documentation

## 2018-05-30 DIAGNOSIS — F32A Depression, unspecified: Secondary | ICD-10-CM | POA: Insufficient documentation

## 2018-05-30 MED ORDER — ALPRAZOLAM 0.25 MG PO TABS: 0.2500 mg | ORAL_TABLET | Freq: Every evening | ORAL | 1 refills | Status: DC | PRN

## 2018-05-30 NOTE — Patient Instructions (Signed)
1. HH therapy 3-4 weeks?   2. Follow up with outpatient therapy afterwards  3. Stop flomax  4. Wear ASO for now to help keep left ankle controlled in standing.   5. Keep legs elevated to help with edema.

## 2018-05-30 NOTE — Progress Notes (Signed)
Subjective:    Patient ID: Hannah Jefferson, female    DOB: 1956-07-25, 62 y.o.   MRN: [id number]  HPI   Hannah Jefferson is here in follow-up of her T8 spinal cord injury.  She was discharged from rehab on April 28, 2018. She went to a SNF after leaving [place], and now has been home for a week. She developed another DVT in her left leg and is now on pradaxa.   She has a ramp at home but it doesn't meet code and is difficult to use up and down. (an old ramp which her father had built and is too steep)  She is walking with a RW currently and can walk about 200 feet and is able to walk short distance around the house!. Therapy is working with her at home. She is dressing herself. Her bladder is emptying but fairly urgently and sometimes she has some incontinence prior emptying. Her bowels are regular and continent. She has improved sensation of emptying now. She is only on softner and fiber in addition to any dietary changes she's made.  She denies pain currently. Mood was an issue after she went to the nursing home, but she was placed on lexapro which really seemed to help. Xanax was also used to help with situational anxiety and sleep.    Pain Inventory Average Pain 0 Pain Right Now 0 My pain is na  In the last 24 hours, has pain interfered with the following? General activity 0 Relation with others 0 Enjoyment of life 0 What TIME of day is your pain at its worst? na Sleep (in general) na  Pain is worse with: na Pain improves with: na Relief from Meds: na  Mobility use a walker ability to climb steps?  no do you drive?  no  Function employed # of hrs/week 40 I need assistance with the following:  dressing, bathing, meal prep, household duties and shopping  Neuro/Psych bladder control problems bowel control problems weakness numbness tremor tingling trouble walking depression anxiety  Prior Studies Any changes since last visit?  no  Physicians involved in your care Any  changes since last visit?  no   Family History  Problem Relation Age of Onset  . Dementia Mother   . COPD Mother   . Heart disease Father   . Thyroid nodules Sister   . Diabetes Mellitus II Sister   . Heart disease Brother   . Diabetes Mellitus II Brother   . Diabetes Mellitus II Brother   . Heart disease Brother    Social History   Socioeconomic History  . Marital status: Married    Spouse name: Not on file  . Number of children: Not on file  . Years of education: Not on file  . Highest education level: Not on file  Occupational History  . Not on file  Social Needs  . Financial resource strain: Not on file  . Food insecurity:    Worry: Not on file    Inability: Not on file  . Transportation needs:    Medical: Not on file    Non-medical: Not on file  Tobacco Use  . Smoking status: Never Smoker  . Smokeless tobacco: Never Used  Substance and Sexual Activity  . Alcohol use: Never    Frequency: Never  . Drug use: Never  . Sexual activity: Not on file  Lifestyle  . Physical activity:    Days per week: Not on file    Minutes per session:  Not on file  . Stress: Not on file  Relationships  . Social connections:    Talks on phone: Not on file    Gets together: Not on file    Attends religious service: Not on file    Active member of club or organization: Not on file    Attends meetings of clubs or organizations: Not on file    Relationship status: Not on file  Other Topics Concern  . Not on file  Social History Narrative  . Not on file   Past Surgical History:  Procedure Laterality Date  . IR IVC FILTER PLMT / S&I /IMG GUID/MOD SED  04/05/2018  . LAPAROSCOPIC CHOLECYSTECTOMY  1992  . REFRACTIVE SURGERY Bilateral 2004   Past Medical History:  Diagnosis Date  . Chronic left hip pain   . GERD (gastroesophageal reflux disease)   . Gestational diabetes   . Hearing loss of both ears   . Hypertension   . Neuropathy    numbness/tingling from waist down  .  Vision abnormalities    BP (!) 148/94   Pulse 96   Ht 5\' 4"  (1.626 m)   Wt 190 lb (86.2 kg)   SpO2 97%   BMI 32.61 kg/m   Opioid Risk Score:   Fall Risk Score:  `1  Depression screen PHQ 2/9  No flowsheet data found.   Review of Systems  Constitutional: Negative.   HENT: Negative.   Eyes: Negative.   Respiratory: Negative.   Cardiovascular: Negative.   Gastrointestinal: Positive for constipation and diarrhea.  Endocrine: Negative.   Genitourinary: Positive for difficulty urinating.  Musculoskeletal: Positive for gait problem and joint swelling.  Skin: Negative.   Allergic/Immunologic: Negative.   Neurological: Positive for tremors, weakness and numbness.  Hematological: Negative.   Psychiatric/Behavioral: Positive for dysphoric mood. The patient is nervous/anxious.   All other systems reviewed and are negative.      Objective:   Physical Exam  General: No acute distress HEENT: EOMI, oral membranes moist Cards: reg rate  Chest: normal effort Abdomen: Soft, NT, ND Skin: dry, intact Extremities: no edema Neurological: Motor B/l UE 5/5.  B/l LE 4-/5 HF, KE and 4/5 ADF. -decreased LT/proprioception below injury level. can stand with chair locked without assistance, transferred without assistance. Could march in place with waist supported.    Psychiatric:  up beat and pleasant       Assessment & Plan:  1.Paraplegia and funtional deficitssecondary to T8 meningioma/resection with myeopathy -HH therapies--->outpt rehab over next 4 -6 weeks or so   -has made remarkable gains!! 2. DVT  Right common femoral vein (mobile), right posterior tibial and peroneal vein thrombi:             -now with LLE DVT  -pradaxa  -elevation, compression. 3. Pain Management: prn tyelnol 4. Mood:much improved  -continue lexapro  -wrote new rx for xanax 0.5mg  qhs prn #30 7. Fluids/Electrolytes/Nutrition:             -follow up lab work with primary next  week  -probably can come off potassium 8. Post op ileus/neurogenic bowel:               -continue fiber,softener, dietary mgt  -much improved! 9.Neurogenic bladder: dc flomax.   -continue as is 10. GERD: PPI    Thirty minutes of face to face patient care time were spent during this visit. All questions were encouraged and answered. Follow up with me in about 6 weeks.

## 2018-05-31 DIAGNOSIS — S62102D Fracture of unspecified carpal bone, left wrist, subsequent encounter for fracture with routine healing: Secondary | ICD-10-CM | POA: Diagnosis not present

## 2018-05-31 DIAGNOSIS — M4714 Other spondylosis with myelopathy, thoracic region: Secondary | ICD-10-CM | POA: Diagnosis not present

## 2018-05-31 DIAGNOSIS — I82411 Acute embolism and thrombosis of right femoral vein: Secondary | ICD-10-CM | POA: Diagnosis not present

## 2018-06-01 DIAGNOSIS — M4714 Other spondylosis with myelopathy, thoracic region: Secondary | ICD-10-CM | POA: Diagnosis not present

## 2018-06-01 DIAGNOSIS — S62102D Fracture of unspecified carpal bone, left wrist, subsequent encounter for fracture with routine healing: Secondary | ICD-10-CM | POA: Diagnosis not present

## 2018-06-01 DIAGNOSIS — I82411 Acute embolism and thrombosis of right femoral vein: Secondary | ICD-10-CM | POA: Diagnosis not present

## 2018-06-02 DIAGNOSIS — S62102D Fracture of unspecified carpal bone, left wrist, subsequent encounter for fracture with routine healing: Secondary | ICD-10-CM | POA: Diagnosis not present

## 2018-06-02 DIAGNOSIS — M4714 Other spondylosis with myelopathy, thoracic region: Secondary | ICD-10-CM | POA: Diagnosis not present

## 2018-06-02 DIAGNOSIS — I82411 Acute embolism and thrombosis of right femoral vein: Secondary | ICD-10-CM | POA: Diagnosis not present

## 2018-06-05 ENCOUNTER — Telehealth: Payer: Self-pay | Admitting: Physical Medicine & Rehabilitation

## 2018-06-05 DIAGNOSIS — S62102D Fracture of unspecified carpal bone, left wrist, subsequent encounter for fracture with routine healing: Secondary | ICD-10-CM | POA: Diagnosis not present

## 2018-06-05 DIAGNOSIS — I82411 Acute embolism and thrombosis of right femoral vein: Secondary | ICD-10-CM | POA: Diagnosis not present

## 2018-06-05 DIAGNOSIS — E876 Hypokalemia: Secondary | ICD-10-CM | POA: Diagnosis not present

## 2018-06-05 DIAGNOSIS — D329 Benign neoplasm of meninges, unspecified: Secondary | ICD-10-CM | POA: Diagnosis not present

## 2018-06-05 DIAGNOSIS — M4714 Other spondylosis with myelopathy, thoracic region: Secondary | ICD-10-CM | POA: Diagnosis not present

## 2018-06-05 DIAGNOSIS — I82413 Acute embolism and thrombosis of femoral vein, bilateral: Secondary | ICD-10-CM | POA: Diagnosis not present

## 2018-06-05 DIAGNOSIS — R29898 Other symptoms and signs involving the musculoskeletal system: Secondary | ICD-10-CM | POA: Diagnosis not present

## 2018-06-05 NOTE — Telephone Encounter (Signed)
[name] is calling to get patient set up with outpatient therapy.  Please call her when this is done.

## 2018-06-06 DIAGNOSIS — I82411 Acute embolism and thrombosis of right femoral vein: Secondary | ICD-10-CM | POA: Diagnosis not present

## 2018-06-06 DIAGNOSIS — S62102D Fracture of unspecified carpal bone, left wrist, subsequent encounter for fracture with routine healing: Secondary | ICD-10-CM | POA: Diagnosis not present

## 2018-06-06 DIAGNOSIS — M4714 Other spondylosis with myelopathy, thoracic region: Secondary | ICD-10-CM | POA: Diagnosis not present

## 2018-06-06 NOTE — Telephone Encounter (Addendum)
Typically HH PT will call and request transition to outpt therapy. Dr Naaman Plummer note says [facility] therapy 3-4 weeks.  Message left for Ms Powers.

## 2018-06-07 DIAGNOSIS — I82411 Acute embolism and thrombosis of right femoral vein: Secondary | ICD-10-CM | POA: Diagnosis not present

## 2018-06-07 DIAGNOSIS — M4714 Other spondylosis with myelopathy, thoracic region: Secondary | ICD-10-CM | POA: Diagnosis not present

## 2018-06-07 DIAGNOSIS — S62102D Fracture of unspecified carpal bone, left wrist, subsequent encounter for fracture with routine healing: Secondary | ICD-10-CM | POA: Diagnosis not present

## 2018-06-08 DIAGNOSIS — I82411 Acute embolism and thrombosis of right femoral vein: Secondary | ICD-10-CM | POA: Diagnosis not present

## 2018-06-08 DIAGNOSIS — M4714 Other spondylosis with myelopathy, thoracic region: Secondary | ICD-10-CM | POA: Diagnosis not present

## 2018-06-08 DIAGNOSIS — S62102D Fracture of unspecified carpal bone, left wrist, subsequent encounter for fracture with routine healing: Secondary | ICD-10-CM | POA: Diagnosis not present

## 2018-06-09 DIAGNOSIS — S62102D Fracture of unspecified carpal bone, left wrist, subsequent encounter for fracture with routine healing: Secondary | ICD-10-CM | POA: Diagnosis not present

## 2018-06-09 DIAGNOSIS — M4714 Other spondylosis with myelopathy, thoracic region: Secondary | ICD-10-CM | POA: Diagnosis not present

## 2018-06-09 DIAGNOSIS — I82411 Acute embolism and thrombosis of right femoral vein: Secondary | ICD-10-CM | POA: Diagnosis not present

## 2018-06-12 ENCOUNTER — Encounter: Payer: Self-pay | Admitting: Hematology

## 2018-06-12 DIAGNOSIS — M4714 Other spondylosis with myelopathy, thoracic region: Secondary | ICD-10-CM | POA: Diagnosis not present

## 2018-06-12 DIAGNOSIS — S62102D Fracture of unspecified carpal bone, left wrist, subsequent encounter for fracture with routine healing: Secondary | ICD-10-CM | POA: Diagnosis not present

## 2018-06-12 DIAGNOSIS — I82411 Acute embolism and thrombosis of right femoral vein: Secondary | ICD-10-CM | POA: Diagnosis not present

## 2018-06-13 ENCOUNTER — Telehealth: Payer: Self-pay

## 2018-06-13 DIAGNOSIS — S62102D Fracture of unspecified carpal bone, left wrist, subsequent encounter for fracture with routine healing: Secondary | ICD-10-CM | POA: Diagnosis not present

## 2018-06-13 DIAGNOSIS — M4714 Other spondylosis with myelopathy, thoracic region: Secondary | ICD-10-CM

## 2018-06-13 DIAGNOSIS — I82411 Acute embolism and thrombosis of right femoral vein: Secondary | ICD-10-CM | POA: Diagnosis not present

## 2018-06-13 NOTE — Telephone Encounter (Signed)
Recieved call from Ulice Brilliant asking what the status was on the transition from [place] to Out Patient therapies.  According to the last phone message from Dr. Naaman Plummer:  Typically [facility] PT will call and request transition to outpt therapy. Dr Naaman Plummer note says [facility] therapy 3-4 weeks.  Message left for Ms Powers.  [employer] and spoke with the [facility] and he stated that he will conference with the [facility] PT and the [facility] OT to see if the patient is truly ready for that transition.  Waiting for return phone call.

## 2018-06-14 DIAGNOSIS — M4714 Other spondylosis with myelopathy, thoracic region: Secondary | ICD-10-CM | POA: Diagnosis not present

## 2018-06-14 DIAGNOSIS — I82411 Acute embolism and thrombosis of right femoral vein: Secondary | ICD-10-CM | POA: Diagnosis not present

## 2018-06-14 DIAGNOSIS — S62102D Fracture of unspecified carpal bone, left wrist, subsequent encounter for fracture with routine healing: Secondary | ICD-10-CM | POA: Diagnosis not present

## 2018-06-14 NOTE — Telephone Encounter (Signed)
Orders written for neuro-rehab

## 2018-06-14 NOTE — Telephone Encounter (Signed)
Mr Nicki Reaper, [facility] regional manager, called me back today, stated that after talking to both the PT and OT that mrs Markie should be ready to be discharged by Friday and ready to be transitioned to out-patient at that time.

## 2018-06-15 NOTE — Telephone Encounter (Signed)
noted 

## 2018-07-04 ENCOUNTER — Inpatient Hospital Stay: Payer: 59 | Attending: Hematology | Admitting: Nurse Practitioner

## 2018-07-04 ENCOUNTER — Encounter: Payer: Self-pay | Admitting: Hematology

## 2018-07-04 VITALS — BP 136/78 | HR 94 | Temp 98.4°F | Resp 18 | Ht 64.0 in | Wt 197.4 lb

## 2018-07-04 DIAGNOSIS — D649 Anemia, unspecified: Secondary | ICD-10-CM

## 2018-07-04 DIAGNOSIS — K219 Gastro-esophageal reflux disease without esophagitis: Secondary | ICD-10-CM | POA: Diagnosis not present

## 2018-07-04 DIAGNOSIS — D321 Benign neoplasm of spinal meninges: Secondary | ICD-10-CM | POA: Diagnosis not present

## 2018-07-04 DIAGNOSIS — Z7901 Long term (current) use of anticoagulants: Secondary | ICD-10-CM | POA: Diagnosis not present

## 2018-07-04 DIAGNOSIS — M419 Scoliosis, unspecified: Secondary | ICD-10-CM | POA: Diagnosis not present

## 2018-07-04 DIAGNOSIS — Z86718 Personal history of other venous thrombosis and embolism: Secondary | ICD-10-CM | POA: Diagnosis not present

## 2018-07-04 DIAGNOSIS — Z79899 Other long term (current) drug therapy: Secondary | ICD-10-CM | POA: Diagnosis not present

## 2018-07-04 DIAGNOSIS — I10 Essential (primary) hypertension: Secondary | ICD-10-CM | POA: Insufficient documentation

## 2018-07-04 DIAGNOSIS — E1140 Type 2 diabetes mellitus with diabetic neuropathy, unspecified: Secondary | ICD-10-CM | POA: Diagnosis not present

## 2018-07-04 DIAGNOSIS — I82411 Acute embolism and thrombosis of right femoral vein: Secondary | ICD-10-CM

## 2018-07-04 DIAGNOSIS — Z9889 Other specified postprocedural states: Secondary | ICD-10-CM | POA: Diagnosis not present

## 2018-07-04 NOTE — Progress Notes (Addendum)
[place]  [phone] [phone]  Clinic New consult Note   Patient Care Team: Carol Ada, MD as PCP - General (Family Medicine) Date of Service: 07/04/18   CHIEF COMPLAINTS/PURPOSE OF CONSULTATION:  DVT  HISTORY OF PRESENTING ILLNESS:  Hannah Jefferson 62 y.o. female is here because of DVT.  She has a history of scoliosis initially developed neurological changes with lower extremity paresthesia and gait disturbance in 2018.  Thoracic and cervical MRI on 02/21/2018 revealed 32 mm extra-axial mass within the spinal canal on the left adjacent to the T7 and T8 vertebral body displacing the spinal cord to the right and causing compression at T7-T8.  Was referred to neurosurgery.  Prior to her planned thoracic laminectomy she sustained a fall and required left wrist ORIF on 03/04/2018.  She recovered well.  She underwent thoracic laminectomy per Dr. Harrison Mons at [facility] on 03/13/2018.  Surveillance doppler on 03/16/18 showed acute right peroneal DVT which was not treated.  She was recommended to undergo repeat doppler in 1 week for follow-up.  She reportedly had no lower extremity swelling or calf pain. She was transferred to [facility] for inpatient rehab and underwent repeat Doppler on 03/25/2018 which showed evidence of acute mobile deep vein thrombosis involving the right common femoral vein, right posterior tibial, and peroneal veins.  Started on IV heparin drip.  Developed coffee-ground emesis from her NG tube.  Hemoglobin dropped from normal to 9.5 on 04/04/2018; platelet count began to trend down on 04/03/2018.  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  CBC improved off heparin. She underwent IVC filter placement on 04/05/2018.  No further anticoagulant was initiated. She was discharged to skilled [facility] where she was reportedly sedentary over the weekend upon her arrival. She noticed left lower extremity swelling.   She reportedly developed DVT in the left leg and was started on dabigatran. She was discharged home on 05/20/18 and continues on anticoagulant. She is tolerating well without bleeding or bruising. She denies personal or family history of DVT or clotting disorder. Has never been on anticoagulant.   Since discharge home patient's activity has improved. She lives at home with her husband, has supportive family who help out. She ambulates with a walker, can stand at the sink to do dishes. Gait is improving. She begins outpatient PT this week. Mobility has improved but still spends much of the day resting.  She continues to have bilateral lower extremity swelling, left greater than right, without calf tenderness. Wears compression stockings. Paresthesia is improving. Denies cough, chest pain, or dyspnea. Appetite is normal. Denies fever, chills, nausea, vomiting, constipation, or diarrhea.   MEDICAL HISTORY:  Past Medical History:  Diagnosis Date  . Chronic left hip pain   . GERD (gastroesophageal reflux disease)   . Gestational diabetes   . Hearing loss of both ears   . Hypertension   . Neuropathy    numbness/tingling from waist down  . Vision abnormalities     SURGICAL HISTORY: Past Surgical History:  Procedure Laterality Date  . IR IVC FILTER PLMT / S&I /IMG GUID/MOD SED  04/05/2018  . LAPAROSCOPIC CHOLECYSTECTOMY  1992  . REFRACTIVE SURGERY Bilateral 2004    SOCIAL HISTORY: Social History   Socioeconomic History  . Marital status: Married    Spouse name: Not on file  . Number of children: 2  . Years of education: Not on file  . Highest education level: Not on file  Occupational History  . Not on file  Social Needs  . Financial resource strain: Not on file  . Food insecurity:    Worry: Not on file    Inability: Not on file  . Transportation needs:    Medical: Not on file    Non-medical: Not on file  Tobacco Use  . Smoking status: Never Smoker  . Smokeless tobacco: Never  Used  Substance and Sexual Activity  . Alcohol use: Never    Frequency: Never  . Drug use: Never  . Sexual activity: Not on file  Lifestyle  . Physical activity:    Days per week: Not on file    Minutes per session: Not on file  . Stress: Not on file  Relationships  . Social connections:    Talks on phone: Not on file    Gets together: Not on file    Attends religious service: Not on file    Active member of club or organization: Not on file    Attends meetings of clubs or organizations: Not on file    Relationship status: Not on file  . Intimate partner violence:    Fear of current or ex partner: Not on file    Emotionally abused: Not on file    Physically abused: Not on file    Forced sexual activity: Not on file  Other Topics Concern  . Not on file  Social History Narrative  . Not on file    FAMILY HISTORY: Family History  Problem Relation Age of Onset  . Dementia Mother   . COPD Mother   . Heart disease Father   . Thyroid nodules Sister   . Diabetes Mellitus II Sister   . Heart disease Brother   . Diabetes Mellitus II Brother   . Diabetes Mellitus II Brother   . Heart disease Brother     ALLERGIES:  is allergic to enoxaparin and heparin.  MEDICATIONS:  Current Outpatient Medications  Medication Sig Dispense Refill  . acetaminophen (TYLENOL) 325 MG tablet Take 1-2 tablets (325-650 mg total) by mouth every 4 (four) hours as needed for mild pain.    Marland Kitchen ALPRAZolam (XANAX) 0.25 MG tablet Take 1 tablet (0.25 mg total) by mouth at bedtime as needed for anxiety. 30 tablet 1  . calcium carbonate (TUMS - DOSED IN MG ELEMENTAL CALCIUM) 500 MG chewable tablet Chew 1 tablet by mouth 3 (three) times daily as needed for indigestion or heartburn.    . dabigatran (PRADAXA) 150 MG CAPS capsule Take 150 mg by mouth 2 (two) times daily.    Marland Kitchen docusate sodium (COLACE) 100 MG capsule Take 100 mg by mouth daily as needed for mild constipation.    Marland Kitchen escitalopram (LEXAPRO) 10 MG tablet  Take 10 mg by mouth daily.    Marland Kitchen lisinopril-hydrochlorothiazide (PRINZIDE,ZESTORETIC) 20-12.5 MG tablet TAKE ONE TABLET BY MOUTH DAILY    . methocarbamol (ROBAXIN) 500 MG tablet Patient takes as needed    . omeprazole (PRILOSEC) 40 MG capsule omeprazole 40 mg capsule,delayed release    . polycarbophil (FIBERCON) 625 MG tablet Take 1 tablet (625 mg total) by mouth daily.    Marland Kitchen saccharomyces boulardii (FLORASTOR) 250 MG capsule Take 1 capsule (250 mg total) by mouth 2 (two) times daily.     No current facility-administered medications for this visit.     REVIEW OF SYSTEMS:   Constitutional: Denies fevers, chills or abnormal night sweats, or weight loss  Ears, nose, mouth, throat, and face: Denies mucositis or  sore throat Respiratory: Denies cough, dyspnea or wheezes Cardiovascular: Denies palpitation, chest discomfort (+) bilateral lower extremity swelling, L>R  Gastrointestinal:  Denies nausea, vomiting, constipation, diarrhea, hematochezia, (+) heartburn  Skin: Denies abnormal skin rashes Lymphatics: Denies easy bruising Neurological: Denies new weakness (+) lower extremity numbness and tingling, improving  Behavioral/Psych: Mood is stable, no new changes  All other systems were reviewed with the patient and are negative.  PHYSICAL EXAMINATION: ECOG PERFORMANCE STATUS: 2-3   Vitals:   07/04/18 1449  BP: 136/78  Pulse: 94  Resp: 18  Temp: 98.4 F (36.9 C)  SpO2: 98%   Filed Weights   07/04/18 1449  Weight: 197 lb 6.4 oz (89.5 kg)    GENERAL:alert, no distress and comfortable SKIN: skin color, texture, turgor are normal, no rashes or significant lesions EYES:  sclera clear OROPHARYNX:no thrush or ulcers  LYMPH:  no palpable cervical or supraclavicular lymphadenopathy  LUNGS: clear to auscultation with normal breathing effort HEART: regular rate & rhythm, L>R lower extremity edema, no calf tenderness  ABDOMEN:abdomen soft, non-tender and normal bowel  sounds Musculoskeletal:no cyanosis of digits and no clubbing  PSYCH: alert & oriented x 3 with fluent speech  LABORATORY DATA:  I have reviewed the data as listed CBC Latest Ref Rng & Units 04/25/2018 04/24/2018 04/17/2018  WBC 4.0 - 10.5 K/uL 8.0 6.2 5.0  Hemoglobin 12.0 - 15.0 g/dL 10.8(L) 10.5(L) 10.9(L)  Hematocrit 36.0 - 46.0 % 33.6(L) 31.7(L) 32.6(L)  Platelets 150 - 400 K/uL 208 202 217    CMP Latest Ref Rng & Units 04/27/2018 04/25/2018 04/24/2018  Glucose 65 - 99 mg/dL 101(H) 121(H) 105(H)  BUN 6 - 20 mg/dL 5(L) 6 5(L)  Creatinine 0.44 - 1.00 mg/dL 0.48 0.44 0.47  Sodium 135 - 145 mmol/L 135 129(L) 133(L)  Potassium 3.5 - 5.1 mmol/L 3.9 3.9 4.3  Chloride 101 - 111 mmol/L 100(L) 99(L) 99(L)  CO2 22 - 32 mmol/L 27 19(L) 25  Calcium 8.9 - 10.3 mg/dL 8.8(L) 8.6(L) 8.9  Total Protein 6.5 - 8.1 g/dL - - -  Total Bilirubin 0.3 - 1.2 mg/dL - - -  Alkaline Phos 38 - 126 U/L - - -  AST 15 - 41 U/L - - -  ALT 14 - 54 U/L - - -     RADIOGRAPHIC STUDIES: I have personally reviewed the radiological images as listed and agreed with the findings in the report. No results found.  ASSESSMENT & PLAN: 62 yo female with bilateral DVT   1. 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 She developed a second DVT in left lower extremity while off anticoagulant and during period of immobility. Was subsequently started on dabigatran in 04/2018 and is tolerating well, no bleeding. Dr. Burr Medico recommends to continue anticoagulation for at least 6 months, or until she is fully mobile.  -Will repeat doppler studies to see if DVTs have resolved, if so, will refer her to IR for IVC filter removal. -she does not have family history of blood clots, hers is not  likely related to gene mutation -she is currently on anticoagulation and any hypercoagulable work up would be affected by this. May pursue further work up 1 month after discontinuation of anticoagulant. -We discussed preventative strategies  for avoiding other VTE events, such as physical exercise and mobility, avoiding hormonal therapies, avoiding tobacco, and frequent mobility during travel. She would need anticoagulation with any surgeries in the future. She currently wears compression stockings. She was encouraged to remain up to date with age-appropriate cancer screenings.  -Dr. Burr Medico recommends to take a baby aspirin after discontinuation of dabigatran.  -We will call her with doppler results and make referral to IR, plan to see her back in 5-6 months to evaluate readiness to discontinue anticoagulation.  2. Anemia  -She developed mild anemia during hospitalization for her back surgery, which may be related to surgery and anticoagulation. Repeat labs at PCP in 05/2018 indicate anemia and thrombocytopenia resolved.   PLAN: -Medical record reviewed -Continue anticoagulation with dabigatran  -Repeat doppler in next 1-2 weeks (ordered today), refer to IR for IVC filter if DVTs resolved  -Continue wearing compression stockings, PT, and increasing mobility  -lab, f/u in 5-6 months   Orders Placed This Encounter  Procedures  . CBC with Differential (Cancer Center Only)    Standing Status:   Standing    Number of Occurrences:   10    Standing Expiration Date:   07/06/2019    All questions were answered. The patient knows to call the clinic with any problems, questions or concerns. I spent 45 minutes counseling the patient face to face. The total time spent in the appointment was 60 minutes and more than 50% was on counseling.     Alla Feeling, NP 07/05/18   Addendum  I have reviewed the above documentation for accuracy and completeness, and I agree with the above.  Hannah Jefferson is a old  female, with past medical history of scoliosis and a 32 mm extra-axial mass at the T7 and T8 level, status post surgical resection in May 2019.  She developed lower extremity DVT after surgery, unfortunately developed HIT when she was on heparin for DVT, anticoagulation was stopped and she had IVC filter placed.  She subsequently developed additional lower extremity DVT, and started on dabigatran and been tolerating well.  She covering well from her spinal surgery, but has not return to her for physical activity.  I recommend her to continue dabigatran until she is able to ambulate without difficulty.  Also her DVT is clearly provoked by surgery and immobility, risk of recurrent DVT is high due to her sedentary lifestyle. I think it is fine to remove her IVC filter, if repeated Doppler is negative for DVT. I see her back in 5 to 6 months, to see if we can take her off anticoagulation. If she comes off anticoagulation, then I may consider hypercoagulopathy work up.   Truitt Merle  07/04/2018

## 2018-07-05 ENCOUNTER — Ambulatory Visit: Payer: 59 | Attending: Family Medicine

## 2018-07-05 ENCOUNTER — Encounter: Payer: Self-pay | Admitting: Nurse Practitioner

## 2018-07-05 ENCOUNTER — Ambulatory Visit: Payer: 59 | Admitting: Physical Therapy

## 2018-07-05 ENCOUNTER — Encounter: Payer: 59 | Admitting: Occupational Therapy

## 2018-07-05 ENCOUNTER — Other Ambulatory Visit: Payer: Self-pay

## 2018-07-05 ENCOUNTER — Ambulatory Visit: Payer: 59 | Admitting: Occupational Therapy

## 2018-07-05 ENCOUNTER — Telehealth: Payer: Self-pay

## 2018-07-05 ENCOUNTER — Encounter: Payer: Self-pay | Admitting: Occupational Therapy

## 2018-07-05 VITALS — BP 128/83 | HR 100

## 2018-07-05 DIAGNOSIS — R208 Other disturbances of skin sensation: Secondary | ICD-10-CM

## 2018-07-05 DIAGNOSIS — R2681 Unsteadiness on feet: Secondary | ICD-10-CM | POA: Diagnosis present

## 2018-07-05 DIAGNOSIS — R278 Other lack of coordination: Secondary | ICD-10-CM

## 2018-07-05 DIAGNOSIS — M6281 Muscle weakness (generalized): Secondary | ICD-10-CM

## 2018-07-05 DIAGNOSIS — R2689 Other abnormalities of gait and mobility: Secondary | ICD-10-CM | POA: Diagnosis not present

## 2018-07-05 NOTE — Therapy (Signed)
[facility] [address] [place], [place], [address] Phone: [phone]   Fax:  [phone]  Occupational Therapy Evaluation  Patient Details  Name: Hannah Jefferson MRN: [id number] Date of Birth: July 17, 1956 Referring Provider: Dr Naaman Plummer   Encounter Date: 07/05/2018  OT End of Session - 07/05/18 2052    Visit Number  1    Number of Visits  17    Date for OT Re-Evaluation  09/03/18    Authorization Type  [employer] 20 visit limit OT    Authorization - Visit Number  1    Authorization - Number of Visits  20    OT Start Time  6761    OT Stop Time  1625    OT Time Calculation (min)  52 min    Behavior During Therapy  [facility] for tasks assessed/performed       Past Medical History:  Diagnosis Date  . Chronic left hip pain   . GERD (gastroesophageal reflux disease)   . Gestational diabetes   . Hearing loss of both ears   . Hypertension   . Neuropathy    numbness/tingling from waist down  . Vision abnormalities     Past Surgical History:  Procedure Laterality Date  . IR IVC FILTER PLMT / S&I /IMG GUID/MOD SED  04/05/2018  . LAPAROSCOPIC CHOLECYSTECTOMY  1992  . REFRACTIVE SURGERY Bilateral 2004    There were no vitals filed for this visit.  Subjective Assessment - 07/05/18 1542    Subjective   I am hoping to get back to work soon    Currently in Pain?  No/denies    Pain Score  0-No pain        [facility] OT Assessment - 07/05/18 1545      Assessment   Medical Diagnosis  Thoracic myelopathy    Referring Provider  Dr Naaman Plummer    Onset Date/Surgical Date  03/13/18    Hand Dominance  Left    Prior Therapy  CIR,SNF, HHOT      Precautions   Precautions  None    Precaution Comments  H/O DVT Bilateral - with IVC filter      Restrictions   Weight Bearing Restrictions  No      Balance Screen   Has the patient fallen in the past 6 months  Yes    How many times?  1x    Is the patient reluctant to leave their home  because of a fear of falling?   No      Prior Function   Level of Independence  Independent with basic ADLs;Independent with homemaking with ambulation    Vocation  Full time employment    Engineer, materials at [employer], walking, bowling, motorcycle, read, very active      ADL   Eating/Feeding  Independent    Grooming  Independent    Scientist, clinical (histocompatibility and immunogenetics)  Independent    Lower Body Bathing  Modified independent    Upper Body Dressing  Increased time    Lower Body Dressing  Minimal assistance    Toilet Transfer  Modified independent    [place] independent    Toileting -  Advertising copywriter tub bench;Grab bars      IADL   Prior Level of Function Shopping  Independent  Shopping  Assistance for transportation    Prior Level of Function Light Archivist  Performs light daily tasks but cannot maintain acceptable level of cleanliness    Prior Level of Function Meal Prep  Independent    Meal Prep  Able to complete simple cold meal and snack prep    Prior Level of Function Community Mobility  Independent    Prior Level of Function Loss adjuster, chartered financial matters independently (budgets, writes checks, pays rent, bills goes to bank), collects and keeps track of income      Written Expression   Dominant Hand  Left      Vision Assessment   Eye Alignment  Within Functional Limits    Vision Assessment  Vision not tested      Activity Tolerance   Activity Tolerance  Tolerates 10-20 min activity with multiple rests      Cognition   Overall Cognitive Status  Within Functional Limits for tasks assessed      Posture/Postural Control   Posture/Postural Control  Postural limitations    Postural Limitations  Posterior pelvic  tilt   limited lower trunk initiated controlled movement     Sensation   Light Touch  Appears Intact   upper extremities     Coordination   Gross Motor Movements are Fluid and Coordinated  No    Fine Motor Movements are Fluid and Coordinated  Yes    Right 9 Hole Peg Test  23    Left 9 Hole Peg Test  22      Perception   Perception  Within Functional Limits      Praxis   Praxis  Intact      ROM / Strength   AROM / PROM / Strength  AROM;Strength      AROM   Overall AROM   Within functional limits for tasks performed      Strength   Overall Strength  Within functional limits for tasks performed   BUE's w/ exception of grip strength     Hand Function   Right Hand Gross Grasp  Impaired    Right Hand Grip (lbs)  34    Right Hand Lateral Pinch  9 lbs    Right Hand 3 Point Pinch  10 lbs    Left Hand Gross Grasp  Impaired    Left Hand Grip (lbs)  36    Left Hand Lateral Pinch  9 lbs    Left 3 point pinch  10 lbs                      OT Education - 07/05/18 2051    Education Details  evaluation findings, potential goals.  Upgraded putty to green    Person(s) Educated  Patient;Caregiver(s)    Methods  Explanation    Comprehension  Verbalized understanding       OT Short Term Goals - 07/05/18 2120      OT SHORT TERM GOAL #1   Title  Patient will complete a home exercise program designed to improve grip strength - due 08/04/18    Time  4    Period  Weeks    Status  New    Target Date  08/04/18      OT SHORT TERM GOAL #2   Title  Patient wil stand in shower from tub transfer bench in shower to wash periarea with supervision  Time  4    Period  Weeks    Status  New      OT SHORT TERM GOAL #3   Title  Patient will demonstrate understanding of graduated driving program, and/or driving resources to prepare for return to driving    Time  4    Period  Weeks    Status  New      OT SHORT TERM GOAL #4   Title  Patient will don socks and shoes, and  fasten/tie shoes with modified independence    Time  4    Period  Weeks    Status  New        OT Long Term Goals - 07/05/18 2125      OT LONG TERM GOAL #1   Title  Patient will demonstrate sufficient activity tolerance and functional mobility to return work 4-6 hours per day following daily self care skills.    Time  8    Period  Weeks    Status  New    Target Date  09/03/18      OT LONG TERM GOAL #2   Title  Patient will prepare a hot meal consisting of at least two items with no more than intermittent min assist    Time  8    Period  Weeks    Status  New      OT LONG TERM GOAL #3   Title  Patient will complete simple housekeeping and laundry from ambulatory level without physical assistance    Time  8    Period  Weeks      OT LONG TERM GOAL #4   Title  Patient will demonstrate improved dynamic stand balance to stand without UE support for greater than 10 min while completing familiar functional task.      Time  8    Period  Weeks    Status  New            Plan - 07/05/18 2053    Clinical Impression Statement  Patient is a 62 year old woman with thoracic myelopathy following T8 Myeloma with tumor resection and complicated medical course with adynamic ileus following surgery.  Patient with significant bilateral DVT's during course of recovery requiring placement of IVC filter.  Patient has received OT in acute care, in inpatient rehab, in skilled nursing facility and in home health since surgery in April.  Patient has made remarkable progress thus far from her initial status with paraplegia, and both bowel and bladder incontinenece, to her current status where she is walking up to 200 ft with a rolling walker, and completing basic self care skills with supervision to modified independence.  Patient is hoping to return to driving and work, but is limited by significant LE weakness, decreased balance, reliance on UE's for functional mobility, decreased activity tolerance /  fatigue, and decreased grip strength    Occupational Profile and client history currently impacting functional performance  Patient is married, worked full time as Glass blower/designer, very active in her church with excellent extended family support    Occupational performance deficits (Please refer to evaluation for details):  ADL's;IADL's;Work;Leisure    Rehab Potential  Excellent    OT Frequency  2x / week    OT Duration  8 weeks    OT Treatment/Interventions  Self-care/ADL training;Therapeutic exercise;Aquatic Therapy;Balance training;Therapeutic activities;Functional Mobility Training;DME and/or AE instruction;Energy conservation;Patient/family education;Neuromuscular education;Stair Training    Plan  Discuss aquatics, functional mobility, stand tolerance, decrease UE  reliance in standing - dynamic    Clinical Decision Making  Limited treatment options, no task modification necessary    OT Home Exercise Plan  Theraputty    Consulted and Agree with Plan of Care  Patient;Family member/caregiver       Patient will benefit from skilled therapeutic intervention in order to improve the following deficits and impairments:  Decreased knowledge of use of DME, Pain, Decreased mobility, Impaired sensation, Improper body mechanics, Decreased activity tolerance, Decreased endurance, Decreased strength, Increased muscle spasms, Improper spinal/pelvic alignment, Decreased balance, Difficulty walking  Visit Diagnosis: Muscle weakness (generalized) - Plan: Ot plan of care cert/re-cert  Unsteadiness on feet - Plan: Ot plan of care cert/re-cert  Other lack of coordination - Plan: Ot plan of care cert/re-cert    Problem List Patient Active Problem List   Diagnosis Date Noted  . Anxiety and depression 05/30/2018  . E. coli UTI 04/27/2018  . Acute blood loss anemia   . Acute deep vein thrombosis (DVT) of right femoral vein ([place])   . Hyponatremia   . Hypokalemia   . Hypomagnesemia   . Ileus,  postoperative ([place])   . Thoracic myelopathy 03/21/2018  . Neurogenic bowel 03/21/2018  . Neurogenic bladder 03/21/2018  . Left wrist fracture, sequela 03/21/2018  . Numbness 02/15/2018  . Ataxic gait 02/15/2018  . Left leg weakness 02/15/2018  . Other fatigue 02/15/2018  . Urinary incontinence 02/15/2018  . Bowel incontinence 02/15/2018    Mariah Milling, OTR/L 07/05/2018, 9:50 PM  [facility] [address] [place], [place], [address] Phone: [phone]   Fax:  [phone]  Name: DARNISE MONTAG MRN: [id number] Date of Birth: [birth year]

## 2018-07-05 NOTE — Telephone Encounter (Signed)
Left voice message for appointment for LE venous doppler tomorrow 8/22 at 11:00 at [facility] to arrive by 10:45.  Encouraged patient to call back if she has any questions.

## 2018-07-05 NOTE — Therapy (Signed)
[facility] [address] [place], [place], [address] Phone: [phone]   Fax:  [phone]  Physical Therapy Evaluation  Patient Details  Name: Hannah Jefferson MRN: [id number] Date of Birth: 12/23/1955 Referring Provider: Dr. Naaman Plummer   Encounter Date: 07/05/2018  PT End of Session - 07/05/18 1610    Visit Number  1    Number of Visits  17    Date for PT Re-Evaluation  09/03/18    Authorization Type  UHC: 20 visits PT and 20 visits OT    PT Start Time  1451    PT Stop Time  1531    PT Time Calculation (min)  40 min    Equipment Utilized During Treatment  --   S for safety   Activity Tolerance  Patient tolerated treatment well    Behavior During Therapy  [facility] for tasks assessed/performed       Past Medical History:  Diagnosis Date  . Chronic left hip pain   . GERD (gastroesophageal reflux disease)   . Gestational diabetes   . Hearing loss of both ears   . Hypertension   . Neuropathy    numbness/tingling from waist down  . Vision abnormalities     Past Surgical History:  Procedure Laterality Date  . IR IVC FILTER PLMT / S&I /IMG GUID/MOD SED  04/05/2018  . LAPAROSCOPIC CHOLECYSTECTOMY  1992  . REFRACTIVE SURGERY Bilateral 2004    Vitals:   07/05/18 1512  BP: 128/83  Pulse: 100     Subjective Assessment - 07/05/18 1505    Subjective  Pt is a 62 yo female who presented to [facility] for initial neurosurgical evaluation of a T7-8 meningioma referred by Dr. Sherley Bounds. Patient reported numbness in lower extremities since 10/18 worse on the left. She fell 03/05/18 and broke her wrist; underwent ORIF and has no precautions or weight bearing restrictions. Pt in CIR until 04/28/18, then went to SNF, then d/c home with HHPT. Pt was cleared by neurosurgeon on 07/04/18 for all activity. Pt amb. back to the gym with RW. Pt still has N/T starting from B ribcage to BLEs but sensation is improving. Pt has a ramp  at her house that she walks with RW to driveway (she does this 5 laps per day). Pt has attempted amb. with a quad tip attachment. Pt is able to get OOB at night to use bathroom without. Pt had HHPT/OT after CIR and SNF rehab. Pt has intermittent back pain standing for too long. Pt reports HA at lunch but Tylenol has helped.     Patient is accompained by:  Family member   sister: [name]   Pertinent History  B DVTs s/p 4/20019 surgery with IVC filter in place, hx of gestation DM only, HTN, Chronic L hip pain, scoliosis, hiatal hernia    Patient Stated Goals  I want to get back as much as possible.     Currently in Pain?  No/denies         [facility] PT Assessment - 07/05/18 1516      Assessment   Medical Diagnosis  0.0     Referring Provider  Dr. Naaman Plummer    Onset Date/Surgical Date  03/13/18    Hand Dominance  Left    Prior Therapy  CIR, SNF, and HHPT      Precautions   Precautions  Other (comment)    Precaution Comments  Pt has B DVTs with IVC filter in place. Pt  receiving updated scan tomorrow (07/06/18).      Restrictions   Weight Bearing Restrictions  No      Balance Screen   Has the patient fallen in the past 6 months  Yes    How many times?  4    Has the patient had a decrease in activity level because of a fear of falling?   Yes    Is the patient reluctant to leave their home because of a fear of falling?   Yes      [place]  Private residence    Living Arrangements  Spouse/significant other    Available Help at Discharge  Family    Type of [place]  One level   basement but doesn't go down to [place] - 2 wheels;Tub bench;Cane - single point;Grab bars - toilet;Grab bars - tub/shower;Toilet riser;Wheelchair - manual   SPC with quad attachment, w/c is on loan   Additional Comments  Pt uses manual w/c when at home by herself. W/c is on order, just waiting on manual w/c.        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 light touch in B LEs and trunk      Coordination   Gross Motor Movements are Fluid and Coordinated  No    Fine Motor Movements are Fluid and Coordinated  No    Heel Shin Test  Decr. speed, especially LLE.      ROM / Strength   AROM / PROM / Strength  AROM;Strength      AROM   Overall AROM   Deficits    Overall AROM Comments  BLE AROM WNL, except for decr. L hip ER 2/2 weakness.       Strength   Overall Strength  Deficits;Unable to assess    Overall Strength Comments  BLE weakness noted  based on gait deviations. However, PT did not formally perform MMT 2/2 B LE DVTs and pt is having a new scan tomorrow for DVTs. She does have an IVC filter in place. Pt able to move BLEs against gravity for at least 3/5 strength.       Transfers   Transfers  Sit to Stand;Stand to Sit    Sit to Stand  With upper extremity assist;From chair/3-in-1;5: Supervision    Stand to Sit  5: Supervision;With upper extremity assist;To chair/3-in-1      Ambulation/Gait   Ambulation/Gait  Yes    Ambulation/Gait Assistance  5: Supervision    Ambulation/Gait Assistance Details  S for safety.    Ambulation Distance (Feet)  100 Feet    Assistive device  Rolling walker    Gait Pattern  Step-through pattern;Decreased stride length;Decreased dorsiflexion - right;Decreased dorsiflexion - left    Ambulation Surface  Level;Indoor    Gait velocity  2.23 ft/sec.       Standardized Balance Assessment   Standardized Balance Assessment  Timed  Up and Go Test      Timed Up and Go Test   TUG  Normal TUG     Normal TUG (seconds)  18.33   with RW               Objective measurements completed on examination: See above findings.              PT Education - 07/05/18 1610    Education Details  PT educated pt on PT POC, frequency, and duration. PT educated pt on outcome measures.     Person(s) Educated  Patient;Other (comment)   sister: [name]   Methods  Explanation    Comprehension  Verbalized understanding       PT Short Term Goals - 07/05/18 1616      PT SHORT TERM GOAL #1   Title  Pt will be IND in HEP to improve deficits listed above. TARGET DATE FOR ALL STGS: 08/02/18    Status  New      PT SHORT TERM GOAL #2   Title  Pt will improve TUG time with LRAD to </=13.5sec. to decr. falls risk.     Status  New      PT SHORT TERM GOAL #3   Title  Pt will improve gait speed to >/=2.54ft/sec, with LRAD to safely amb. in the community.     Status  New      PT SHORT TERM GOAL #4   Title  Pt will amb. 500' over even terrain, with LRAD at MOD I Level to improve safety during functional mobility.     Status  New      PT SHORT TERM GOAL #5   Title  Perform BERG and write STG and LTG    Status  New        PT Long Term Goals - 07/05/18 1618      PT LONG TERM GOAL #1   Title  Pt will amb. 1000' over even/paved terrain with LRAD at MOD I level to improve functional mobility. TARGET DATE FOR ALL LTGS: 08/30/18    Status  New      PT LONG TERM GOAL #2   Title  Trial amb. without AD when appropriate and write goal as indicated.     Status  New      PT LONG TERM GOAL #3   Title  Pt will report no falls in the last 4 weeks to improve safety.     Status  New             Plan - 07/05/18 1611    Clinical Impression Statement  Pt is a pleasant 62y/o female presenting to OPPT neuro s/p T8 resection and laminectomy for meningioma on 03/13/18 at [facility]. Pt's [place] signficant for the following: B DVTs s/p 4/20019 surgery with IVC filter in place, hx of gestation  DM only, HTN, Chronic L hip pain, scoliosis, hiatal hernia. Pt's gait speed indiates pt is not able to safely amb. in the community. Pt's TUG time indicates pt is at a high risk for falls risk. PT will perform BERG balance test next session, as this session limited by time constraints and extensive hx of post-op recovery. The following deficits were noted upon exam: gait deviations, impaired balance, decr. strength, impaired coordination, impaired sensation, and decr. endurance. Pt would benefit from skilled PT to improve safety during functional mobility.     History and Personal Factors relevant to plan of care:  Pt  worked full-time prior to surgery, very active prior to surgery, L wrist fx on 03/05/18 s/p fall and ORIF (no precautions)    Clinical Presentation  Evolving    Clinical Presentation due to:  B DVTs s/p 4/20019 surgery with IVC filter in place, hx of gestation DM only, HTN, Chronic L hip pain, scoliosis, hiatal hernia    Clinical Decision Making  Moderate    Rehab Potential  Good    Clinical Impairments Affecting Rehab Potential  see above.     PT Frequency  2x / week    PT Duration  8 weeks    PT Treatment/Interventions  ADLs/Self Care Home Management;Biofeedback;Electrical Stimulation;Therapeutic exercise;Therapeutic activities;Functional mobility training;Stair training;Orthotic Fit/Training;Gait training;Patient/family education;DME Instruction;Neuromuscular re-education;Balance training;Vestibular   e-stim with caution 2/2 impaired sensation   PT Next Visit Plan  Perform BERG and write STG and LTGs. Initiate HEP.     Consulted and Agree with Plan of Care  Patient;Family member/caregiver    Family Member Consulted  Lou: sister       Patient will benefit from skilled therapeutic intervention in order to improve the following deficits and impairments:  Abnormal gait, Decreased endurance, Impaired sensation, Decreased knowledge of use of DME, Decreased strength, Decreased balance,  Decreased mobility, Decreased range of motion, Decreased coordination  Visit Diagnosis: Other abnormalities of gait and mobility - Plan: PT plan of care cert/re-cert  Muscle weakness (generalized) - Plan: PT plan of care cert/re-cert  Other disturbances of skin sensation - Plan: PT plan of care cert/re-cert  Other lack of coordination - Plan: PT plan of care cert/re-cert     Problem List Patient Active Problem List   Diagnosis Date Noted  . Anxiety and depression 05/30/2018  . E. coli UTI 04/27/2018  . Acute blood loss anemia   . Acute deep vein thrombosis (DVT) of right femoral vein ([place])   . Hyponatremia   . Hypokalemia   . Hypomagnesemia   . Ileus, postoperative ([place])   . Thoracic myelopathy 03/21/2018  . Neurogenic bowel 03/21/2018  . Neurogenic bladder 03/21/2018  . Left wrist fracture, sequela 03/21/2018  . Numbness 02/15/2018  . Ataxic gait 02/15/2018  . Left leg weakness 02/15/2018  . Other fatigue 02/15/2018  . Urinary incontinence 02/15/2018  . Bowel incontinence 02/15/2018    Jaylan Hinojosa L 07/05/2018, 4:21 PM  [facility] [address] [place], [place], [address] Phone: [phone]   Fax:  [phone]  Name: Hannah Jefferson MRN: [id number] Date of Birth: 16-Mar-1956  Geoffry Paradise, PT,DPT 07/05/18 4:21 PM Phone: [phone] Fax: [phone]

## 2018-07-06 ENCOUNTER — Encounter: Payer: Self-pay | Admitting: Rehabilitation

## 2018-07-06 ENCOUNTER — Ambulatory Visit: Payer: 59 | Admitting: Occupational Therapy

## 2018-07-06 ENCOUNTER — Encounter: Payer: Self-pay | Admitting: Occupational Therapy

## 2018-07-06 ENCOUNTER — Ambulatory Visit (HOSPITAL_COMMUNITY)
Admission: RE | Admit: 2018-07-06 | Discharge: 2018-07-06 | Disposition: A | Discharge status: Home / Self Care | Payer: 59 | Source: Ambulatory Visit | Attending: Nurse Practitioner | Admitting: Nurse Practitioner

## 2018-07-06 ENCOUNTER — Ambulatory Visit: Payer: 59 | Admitting: Rehabilitation

## 2018-07-06 ENCOUNTER — Telehealth: Payer: Self-pay | Admitting: *Deleted

## 2018-07-06 VITALS — BP 125/95

## 2018-07-06 DIAGNOSIS — R208 Other disturbances of skin sensation: Secondary | ICD-10-CM

## 2018-07-06 DIAGNOSIS — I82403 Acute embolism and thrombosis of unspecified deep veins of lower extremity, bilateral: Secondary | ICD-10-CM | POA: Insufficient documentation

## 2018-07-06 DIAGNOSIS — E876 Hypokalemia: Secondary | ICD-10-CM | POA: Diagnosis not present

## 2018-07-06 DIAGNOSIS — I82411 Acute embolism and thrombosis of right femoral vein: Secondary | ICD-10-CM | POA: Diagnosis not present

## 2018-07-06 DIAGNOSIS — M6281 Muscle weakness (generalized): Secondary | ICD-10-CM | POA: Diagnosis not present

## 2018-07-06 DIAGNOSIS — R278 Other lack of coordination: Secondary | ICD-10-CM

## 2018-07-06 DIAGNOSIS — R2689 Other abnormalities of gait and mobility: Secondary | ICD-10-CM | POA: Diagnosis not present

## 2018-07-06 DIAGNOSIS — R2681 Unsteadiness on feet: Secondary | ICD-10-CM

## 2018-07-06 NOTE — Progress Notes (Signed)
*  Preliminary Results* Bilateral lower extremity venous duplex completed. Bilateral lower extremities are positive for age indeterminate deep vein thrombosis involving the right femoral, right posterior tibial, right peroneal, left femoral, and left popliteal veins. There is no evidence of Baker's cyst bilaterally.  Preliminary results discussed with Mackie Pai, Triage RN.  07/06/2018 11:35 AM Maudry Mayhew, MHA, RVT, RDCS, RDMS

## 2018-07-06 NOTE — Therapy (Signed)
[facility] [address] [place], [place], [address] Phone: [phone]   Fax:  [phone]  Occupational Therapy Treatment  Patient Details  Name: Hannah Jefferson MRN: [id number] Date of Birth: Apr 14, 1956 Referring Provider: Dr Naaman Plummer   Encounter Date: 07/06/2018  OT End of Session - 07/06/18 1638    Visit Number  2    Number of Visits  17    Date for OT Re-Evaluation  09/03/18    Authorization Type  UHC 20 visit limit OT    Authorization - Visit Number  2    Authorization - Number of Visits  20    OT Start Time  0626    OT Stop Time  1530    OT Time Calculation (min)  45 min    Activity Tolerance  Patient tolerated treatment well    Behavior During Therapy  [facility] for tasks assessed/performed       Past Medical History:  Diagnosis Date  . Chronic left hip pain   . GERD (gastroesophageal reflux disease)   . Gestational diabetes   . Hearing loss of both ears   . Hypertension   . Neuropathy    numbness/tingling from waist down  . Vision abnormalities     Past Surgical History:  Procedure Laterality Date  . IR IVC FILTER PLMT / S&I /IMG GUID/MOD SED  04/05/2018  . LAPAROSCOPIC CHOLECYSTECTOMY  1992  . REFRACTIVE SURGERY Bilateral 2004    Vitals:   07/06/18 1458  BP: (!) 125/95    Subjective Assessment - 07/06/18 1619    Subjective   I bent down and picked up a washcloth from the floor!    Patient is accompained by:  Family member    Currently in Pain?  Yes    Pain Score  5     Pain Location  Back    Pain Orientation  Mid    Pain Descriptors / Indicators  Aching    Pain Type  Surgical pain    Pain Onset  More than a month ago    Pain Frequency  Intermittent    Aggravating Factors   sitting unsupported    Pain Relieving Factors  rest- back support                   OT Treatments/Exercises (OP) - 07/06/18 0001      ADLs   Functional Mobility  worked today on static to dynamic  standing balance to reduce reliance on upper extremities for support.  Patient required min assist to lift off of regular height chair without use of arms.  Worked on standing with anterior posterior balance changes, lateral weight shifts.  Patient needing cueing to reduce tension in upper quadrant and neck.  Increased demand to add tossing and catching a large lightweight ball, then completing PNF Bialteral diagonals.  Patient with increased BP and HR with standing exercise see vitals.  Patient's BP resolved after 5 min seated rest break, although HR remained elevated 122    ADL Comments  Reviewed short and long term goals for OT with patient and her sister in law.               OT Education - 07/06/18 1637    Education Details  short and long term goals    Person(s) Educated  Patient;Caregiver(s)    Methods  Explanation    Comprehension  Verbalized understanding       OT Short Term Goals - 07/06/18  [place] #1   Title  Patient will complete a home exercise program designed to improve grip strength - due 08/04/18    Status  On-going      OT SHORT TERM GOAL #2   Title  Patient wil stand in shower from tub transfer bench in shower to wash periarea with supervision    Status  On-going      OT SHORT TERM GOAL #3   Title  Patient will demonstrate understanding of graduated driving program, and/or driving resources to prepare for return to driving    Status  On-going      OT [place] #4   Title  Patient will don socks and shoes, and fasten/tie shoes with modified independence    Status  On-going        OT Long Term Goals - 07/05/18 2125      OT LONG TERM GOAL #1   Title  Patient will demonstrate sufficient activity tolerance and functional mobility to return work 4-6 hours per day following daily self care skills.    Time  8    Period  Weeks    Status  New    Target Date  09/03/18      OT LONG TERM GOAL #2   Title  Patient will prepare a hot meal  consisting of at least two items with no more than intermittent min assist    Time  8    Period  Weeks    Status  New      OT LONG TERM GOAL #3   Title  Patient will complete simple housekeeping and laundry from ambulatory level without physical assistance    Time  8    Period  Weeks      OT LONG TERM GOAL #4   Title  Patient will demonstrate improved dynamic stand balance to stand without UE support for greater than 10 min while completing familiar functional task.      Time  8    Period  Weeks    Status  New            Plan - 07/06/18 1638    Clinical Impression Statement  Patient is on a solid progressive trajectory - making excellent functional strides.      Occupational Profile and client history currently impacting functional performance  Patient is married, worked full time as Glass blower/designer, very active in her church with excellent extended family support    Occupational performance deficits (Please refer to evaluation for details):  ADL's;IADL's;Work;Leisure    Rehab Potential  Excellent    OT Frequency  2x / week    OT Duration  8 weeks    OT Treatment/Interventions  Self-care/ADL training;Therapeutic exercise;Aquatic Therapy;Balance training;Therapeutic activities;Functional Mobility Training;DME and/or AE instruction;Energy conservation;Patient/family education;Neuromuscular education;Stair Training    Plan  functional mobility, stand tolerance, decrease UE reliance in standing - dynamic, kitchen or housekeeping task    Clinical Decision Making  Limited treatment options, no task modification necessary    OT Home Exercise Plan  Theraputty    Consulted and Agree with Plan of Care  Patient;Family member/caregiver       Patient will benefit from skilled therapeutic intervention in order to improve the following deficits and impairments:  Decreased knowledge of use of DME, Pain, Decreased mobility, Impaired sensation, Improper body mechanics, Decreased activity  tolerance, Decreased endurance, Decreased strength, Increased muscle spasms, Improper spinal/pelvic alignment, Decreased balance, Difficulty walking  Visit Diagnosis: Muscle weakness (generalized)  Unsteadiness on feet  Other disturbances of skin sensation  Other lack of coordination    Problem List Patient Active Problem List   Diagnosis Date Noted  . Anxiety and depression 05/30/2018  . E. coli UTI 04/27/2018  . Acute blood loss anemia   . Acute deep vein thrombosis (DVT) of right femoral vein ([place])   . Hyponatremia   . Hypokalemia   . Hypomagnesemia   . Ileus, postoperative ([place])   . Thoracic myelopathy 03/21/2018  . Neurogenic bowel 03/21/2018  . Neurogenic bladder 03/21/2018  . Left wrist fracture, sequela 03/21/2018  . Numbness 02/15/2018  . Ataxic gait 02/15/2018  . Left leg weakness 02/15/2018  . Other fatigue 02/15/2018  . Urinary incontinence 02/15/2018  . Bowel incontinence 02/15/2018    Mariah Milling, OTR/L 07/06/2018, 4:40 PM  [facility] [address] [place], [place], [address] Phone: [phone]   Fax:  [phone]  Name: Hannah Jefferson MRN: [id number] Date of Birth: January 25, 1956

## 2018-07-06 NOTE — Telephone Encounter (Signed)
FYI  "Sharyn Lull calling report of today's study.  You do not have to document everything because a note will be placed in Epic.  Today was a follow up exam.  Still age indeterminate DVT to right femoral ... and left femoral and popliteal.  I will let the patient go home at this time."

## 2018-07-06 NOTE — Therapy (Signed)
[facility] [address] [place], [place], [address] Phone: [phone]   Fax:  [phone]  Physical Therapy Treatment  Patient Details  Name: Hannah Jefferson MRN: [id number] Date of Birth: November 08, 1956 Referring Provider: Dr Naaman Plummer   Encounter Date: 07/06/2018  PT End of Session - 07/06/18 1503    Visit Number  2    Number of Visits  17    Date for PT Re-Evaluation  09/03/18    Authorization Type  [employer]: 20 visits PT and 20 visits OT    Authorization - Visit Number  2    Authorization - Number of Visits  20    PT Start Time  9741    PT Stop Time  1447    PT Time Calculation (min)  44 min    Equipment Utilized During Treatment  --   S for safety   Activity Tolerance  Patient tolerated treatment well    Behavior During Therapy  [facility] for tasks assessed/performed       Past Medical History:  Diagnosis Date  . Chronic left hip pain   . GERD (gastroesophageal reflux disease)   . Gestational diabetes   . Hearing loss of both ears   . Hypertension   . Neuropathy    numbness/tingling from waist down  . Vision abnormalities     Past Surgical History:  Procedure Laterality Date  . IR IVC FILTER PLMT / S&I /IMG GUID/MOD SED  04/05/2018  . LAPAROSCOPIC CHOLECYSTECTOMY  1992  . REFRACTIVE SURGERY Bilateral 2004    There were no vitals filed for this visit.  Subjective Assessment - 07/06/18 1408    Subjective  Pt reports no changes since yesterday.     Patient is accompained by:  Family member    Pertinent History  B DVTs s/p 4/20019 surgery with IVC filter in place, hx of gestation DM only, HTN, Chronic L hip pain, scoliosis, hiatal hernia    Patient Stated Goals  I want to get back as much as possible.     Currently in Pain?  Yes    Pain Score  5     Pain Location  Back    Pain Orientation  Mid    Pain Descriptors / Indicators  Aching    Pain Type  Surgical pain    Pain Onset  More than a month ago    Pain  Frequency  Intermittent    Aggravating Factors   increased walking    Pain Relieving Factors  rest                        [facility] Adult PT Treatment/Exercise - 07/06/18 1410      Standardized Balance Assessment   Standardized Balance Assessment  Berg Balance Test      Berg Balance Test   Sit to Stand  Able to stand  independently using hands    Standing Unsupported  Able to stand 2 minutes with supervision    Sitting with Back Unsupported but Feet Supported on Floor or Stool  Able to sit safely and securely 2 minutes    Stand to Sit  Sits safely with minimal use of hands    Transfers  Able to transfer safely, definite need of hands    Standing Unsupported with Eyes Closed  Able to stand 10 seconds with supervision    Standing Ubsupported with Feet Together  Needs help to attain position but able to stand for  30 seconds with feet together    From Standing, Reach Forward with Outstretched Arm  Reaches forward but needs supervision    From Standing Position, Pick up Object from [place] to pick up shoe, needs supervision    From Standing Position, Turn to Look Behind Over each Shoulder  Needs supervision when turning    Turn 360 Degrees  Needs close supervision or verbal cueing    Standing Unsupported, Alternately Place Feet on Step/Stool  Able to complete >2 steps/needs minimal assist    Standing Unsupported, One Foot in [place] help to step but can hold 15 seconds    Standing on One Leg  Tries to lift leg/unable to hold 3 seconds but remains standing independently    Total Score  30        Functional Quadriceps: Sit to Stand    Sit on edge of chair, feet flat on floor. Stand upright, extending knees fully.  Shift weight forward and try not to let legs hit back of chair.  Have walker in front of you for safety.  Repeat __10__ times per set. Do __10_ sets per session. Do __1-2_ sessions per day.  http://orth.exer.us/735   Copyright  VHI. All rights reserved.      Drop / Hold    Feet shoulder width apart, relax leg muscles and drop _8-10__ inches. Stop the downward motion and hold a squat _2-3__ seconds.   Keep chair behind you for safety and to "aim" your butt for chair.  Keep back straight and don't bend forward.   Repeat _10__ times per set. Do _1__ sets per session.  http://plyo.exer.us/69   Copyright  VHI. All rights reserved.   Hip Abduction (Standing)    Stand with support. Squeeze pelvic floor and hold. Lift right leg out to side, keeping toe forward. Hold for __2-3_ seconds. Relax for _2-3__ seconds.  Don't twist to the side that you are moving, keep foot pointed forward.  DO SLOWLY!!  Repeat _10__ times. Do _2__ times a day.    Copyright  VHI. All rights reserved.   Marching In-Place    Standing straight, alternate bringing knees toward trunk. Arms swing alternately.  Count to 20 (total).  Keep right hand on counter, can put chair or walker on the left for support.  Work on getting rid of left hand support.  Do _2__ sets. Do _2__ times per day.  Copyright  VHI. All rights reserved.        PT Education - 07/06/18 1503    Education Details  BERG balance score results, HEP    Person(s) Educated  Patient;Other (comment)   sister in law   Methods  Explanation;Demonstration;Handout    Comprehension  Verbalized understanding;Returned demonstration;Verbal cues required;Need further instruction       PT Short Term Goals - 07/06/18 1505      PT SHORT TERM GOAL #1   Title  Pt will be IND in HEP to improve deficits listed above. TARGET DATE FOR ALL STGS: 08/02/18    Status  New      PT SHORT TERM GOAL #2   Title  Pt will improve TUG time with LRAD to </=13.5sec. to decr. falls risk.     Status  New      PT SHORT TERM GOAL #3   Title  Pt will improve gait speed to >/=2.16ft/sec, with LRAD to safely amb. in the community.     Status  New      PT  SHORT TERM GOAL #4   Title  Pt will amb. 500' over even terrain,  with LRAD at MOD I Level to improve safety during functional mobility.     Status  New      PT SHORT TERM GOAL #5   Title  Perform BERG and write STG and LTG    Baseline  30/56    Status  Achieved      Additional Short Term Goals   Additional Short Term Goals  Yes      PT SHORT TERM GOAL #6   Title  Pt will improve BERG balance test to 34/56 in order to indicate decreased fall risk.     Time  4    Period  Weeks    Status  New        PT Long Term Goals - 07/06/18 1506      PT LONG TERM GOAL #1   Title  Pt will amb. 1000' over even/paved terrain with LRAD at MOD I level to improve functional mobility. TARGET DATE FOR ALL LTGS: 08/30/18    Status  New      PT LONG TERM GOAL #2   Title  Trial amb. without AD when appropriate and write goal as indicated.     Status  New      PT LONG TERM GOAL #3   Title  Pt will report no falls in the last 4 weeks to improve safety.     Status  New      PT LONG TERM GOAL #4   Title  Pt will improve BERG balance score to 38/56 in order to indicate decreased fall risk.     Time  8    Period  Weeks    Status  New            Plan - 07/06/18 1504    Clinical Impression Statement  Skilled session focused on formal assessment of balance with BERG balance test.  Note score of 30/56 indicative of near 100% risk of falls (any score <36).  Initiated HEP for BLE strength and balance.  See pt instruction for details.     Rehab Potential  Good    Clinical Impairments Affecting Rehab Potential  see above.     PT Frequency  2x / week    PT Duration  8 weeks    PT Treatment/Interventions  ADLs/Self Care Home Management;Biofeedback;Electrical Stimulation;Therapeutic exercise;Therapeutic activities;Functional mobility training;Stair training;Orthotic Fit/Training;Gait training;Patient/family education;DME Instruction;Neuromuscular re-education;Balance training;Vestibular   e-stim with caution 2/2 impaired sensation   PT Next Visit Plan  Add to HEP for  balance (wall bumps would be great for her), gait with LRAD as able (maybe start in // bars with single UE support?), check leg length descrepancy-add shoe lift, need for AFO?    Consulted and Agree with Plan of Care  Patient;Family member/caregiver    Family Member Consulted  Lou: sister       Patient will benefit from skilled therapeutic intervention in order to improve the following deficits and impairments:  Abnormal gait, Decreased endurance, Impaired sensation, Decreased knowledge of use of DME, Decreased strength, Decreased balance, Decreased mobility, Decreased range of motion, Decreased coordination  Visit Diagnosis: Muscle weakness (generalized)  Unsteadiness on feet  Other abnormalities of gait and mobility  Other disturbances of skin sensation     Problem List Patient Active Problem List   Diagnosis Date Noted  . Anxiety and depression 05/30/2018  . E. coli UTI 04/27/2018  . Acute blood  loss anemia   . Acute deep vein thrombosis (DVT) of right femoral vein ([place])   . Hyponatremia   . Hypokalemia   . Hypomagnesemia   . Ileus, postoperative ([place])   . Thoracic myelopathy 03/21/2018  . Neurogenic bowel 03/21/2018  . Neurogenic bladder 03/21/2018  . Left wrist fracture, sequela 03/21/2018  . Numbness 02/15/2018  . Ataxic gait 02/15/2018  . Left leg weakness 02/15/2018  . Other fatigue 02/15/2018  . Urinary incontinence 02/15/2018  . Bowel incontinence 02/15/2018    Denice Bors 07/06/2018, 3:07 PM  [facility] [address] [place], [place], [address] Phone: [phone]   Fax:  [phone]  Name: Hannah Jefferson MRN: [id number] Date of Birth: 01/01/1956

## 2018-07-06 NOTE — Patient Instructions (Signed)
Functional Quadriceps: Sit to Stand    Sit on edge of chair, feet flat on floor. Stand upright, extending knees fully.  Shift weight forward and try not to let legs hit back of chair.  Have walker in front of you for safety.  Repeat __10__ times per set. Do __10_ sets per session. Do __1-2_ sessions per day.  http://orth.exer.us/735   Copyright  VHI. All rights reserved.    Drop / Hold    Feet shoulder width apart, relax leg muscles and drop _8-10__ inches. Stop the downward motion and hold a squat _2-3__ seconds.   Keep chair behind you for safety and to "aim" your butt for chair.  Keep back straight and don't bend forward.   Repeat _10__ times per set. Do _1__ sets per session.  http://plyo.exer.us/69   Copyright  VHI. All rights reserved.   Hip Abduction (Standing)    Stand with support. Squeeze pelvic floor and hold. Lift right leg out to side, keeping toe forward. Hold for __2-3_ seconds. Relax for _2-3__ seconds.  Don't twist to the side that you are moving, keep foot pointed forward.  DO SLOWLY!!  Repeat _10__ times. Do _2__ times a day.    Copyright  VHI. All rights reserved.   Marching In-Place    Standing straight, alternate bringing knees toward trunk. Arms swing alternately.  Count to 20 (total).  Keep right hand on counter, can put chair or walker on the left for support.  Work on getting rid of left hand support.  Do _2__ sets. Do _2__ times per day.  Copyright  VHI. All rights reserved.

## 2018-07-10 ENCOUNTER — Encounter: Payer: Self-pay | Admitting: Rehabilitation

## 2018-07-10 ENCOUNTER — Ambulatory Visit: Payer: 59 | Admitting: Occupational Therapy

## 2018-07-10 ENCOUNTER — Ambulatory Visit: Payer: 59 | Admitting: Rehabilitation

## 2018-07-10 DIAGNOSIS — R2681 Unsteadiness on feet: Secondary | ICD-10-CM | POA: Diagnosis present

## 2018-07-10 DIAGNOSIS — R2689 Other abnormalities of gait and mobility: Secondary | ICD-10-CM | POA: Diagnosis not present

## 2018-07-10 DIAGNOSIS — M6281 Muscle weakness (generalized): Secondary | ICD-10-CM

## 2018-07-10 DIAGNOSIS — R278 Other lack of coordination: Secondary | ICD-10-CM | POA: Diagnosis present

## 2018-07-10 DIAGNOSIS — R208 Other disturbances of skin sensation: Secondary | ICD-10-CM | POA: Diagnosis not present

## 2018-07-10 NOTE — Therapy (Signed)
[facility] [address] [place], [place], [address] Phone: [phone]   Fax:  [phone]  Physical Therapy Treatment  Patient Details  Name: Hannah Jefferson MRN: [id number] Date of Birth: 10/05/1956 Referring Provider: Dr Naaman Plummer   Encounter Date: 07/10/2018  PT End of Session - 07/10/18 1523    Visit Number  3    Number of Visits  17    Date for PT Re-Evaluation  09/03/18    Authorization Type  [employer]: 20 visits PT and 20 visits OT    Authorization - Visit Number  3    Authorization - Number of Visits  20    PT Start Time  3532    PT Stop Time  1448    PT Time Calculation (min)  45 min    Equipment Utilized During Treatment  --   S for safety   Activity Tolerance  Patient tolerated treatment well    Behavior During Therapy  [facility] for tasks assessed/performed       Past Medical History:  Diagnosis Date  . Chronic left hip pain   . GERD (gastroesophageal reflux disease)   . Gestational diabetes   . Hearing loss of both ears   . Hypertension   . Neuropathy    numbness/tingling from waist down  . Vision abnormalities     Past Surgical History:  Procedure Laterality Date  . IR IVC FILTER PLMT / S&I /IMG GUID/MOD SED  04/05/2018  . LAPAROSCOPIC CHOLECYSTECTOMY  1992  . REFRACTIVE SURGERY Bilateral 2004    There were no vitals filed for this visit.  Subjective Assessment - 07/10/18 1408    Subjective  Pt reports cooking a meal over the weekend, went to a baby shower and a birthday party, also up stairs to son's house.     Patient is accompained by:  Family member    Pertinent History  B DVTs s/p 4/20019 surgery with IVC filter in place, hx of gestation DM only, HTN, Chronic L hip pain, scoliosis, hiatal hernia    Patient Stated Goals  I want to get back as much as possible.     Currently in Pain?  Yes    Pain Score  2     Pain Location  Head    Pain Descriptors / Indicators  Headache    Pain Type  Acute  pain    Pain Onset  Yesterday    Pain Frequency  Intermittent    Aggravating Factors   being busy over weekend    Pain Relieving Factors  rest                        [facility] Adult PT Treatment/Exercise - 07/10/18 1405      Ambulation/Gait   Ambulation/Gait  Yes    Ambulation/Gait Assistance  4: Min guard;4: Min assist    Ambulation/Gait Assistance Details  Initiated decreasing support from AD during session.  Began in // bars with pt ambulating forwards with single UE support.  Pt able to do at S level with RUE support only.  Cues for posture and forward weight shift during stance.  Then trialed use of SBQC in // bars for safety only (did not use LUE support) x 4 reps at min/guard to min A level with cues for sequencing and technique, esp when making turns with quad cane.  Ambulated out of // bars on last rep and continued another 30' x 1 and another 25'  x 1 with min/guard to min A level with continued cues for sequencing, breath control, relaxed posture, improved L arm swing and smoother more fluid gait pattern.  Pt unable to do 2 point gait pattern during session.  Again, pt did very well, only needing min A when making turns.  Therefore PT trialed use of quad tip cane (pt reports she has this cane at home).  Performed another 77'  with this device at min/guard to min A level with same cues mentioned above, however gait pattern did seem more fliud with this device.  Pt reports following gait that she has actually already tried this at home with family.  Recommend she continue to practice, but that hands on assist is recommended for safety at this time. Pt verbalized understanding.     Ambulation Distance (Feet)  --   see above   Assistive device  Straight cane;Small based quad cane   SPC with quad tip   Gait Pattern  Step-through pattern;Decreased stride length;Decreased dorsiflexion - right;Decreased dorsiflexion - left    Ambulation Surface  Level;Indoor    Stairs  Yes     Stairs Assistance  5: Supervision;4: Min guard    Stairs Assistance Details (indicate cue type and reason)  Cues for safety when descending as she does not fully load LLE     Stair Management Technique  Two rails;Step to pattern;Alternating pattern;Forwards   alternating when ascending, step to to descend    Number of Stairs  4    Height of Stairs  6      High Level Balance   High Level Balance Comments  While standing in corner, performed lateral weight shifts reaching with UE across body in upward diagonal x 10 reps.  Pt intially doing very quickly and tended to lock out LEs (esp RLE) therefore PT provided target for pt before having pt do without target.  Pt hesitant to fully load LE with forward weight shift.  Also had pt stand with feet narrowed EO x 20 secs>feet narrowed EC x 2 sets of 20 secs.  Continued to note that RLE remains in locked out position in extension, therefore recalled that she had leg length descrepancy.  Note R LE slightly shorter, therefore PT added small heel lift to shoe.  This did seem to distribute weight more equally in LEs with standing and gait.        Therapeutic Activites    Therapeutic Activities  Other Therapeutic Activities    Other Therapeutic Activities  Had pt lie in hooklying, bridge then extend LEs to assess leg length.  Note RLE slightly shorter, therefore added small heel wedge to shoe to decrease difference.               PT Education - 07/10/18 1513    Education Details  beginning to practice with quad tip cane in house with min A from family     Person(s) Educated  Patient;Other (comment)   sister in law   Methods  Explanation    Comprehension  Verbalized understanding       PT Short Term Goals - 07/06/18 1505      PT SHORT TERM GOAL #1   Title  Pt will be IND in HEP to improve deficits listed above. TARGET DATE FOR ALL STGS: 08/02/18    Status  New      PT SHORT TERM GOAL #2   Title  Pt will improve TUG time with LRAD to </=13.5sec.  to decr. falls  risk.     Status  New      PT SHORT TERM GOAL #3   Title  Pt will improve gait speed to >/=2.21ft/sec, with LRAD to safely amb. in the community.     Status  New      PT SHORT TERM GOAL #4   Title  Pt will amb. 500' over even terrain, with LRAD at MOD I Level to improve safety during functional mobility.     Status  New      PT SHORT TERM GOAL #5   Title  Perform BERG and write STG and LTG    Baseline  30/56    Status  Achieved      Additional Short Term Goals   Additional Short Term Goals  Yes      PT SHORT TERM GOAL #6   Title  Pt will improve BERG balance test to 34/56 in order to indicate decreased fall risk.     Time  4    Period  Weeks    Status  New        PT Long Term Goals - 07/06/18 1506      PT LONG TERM GOAL #1   Title  Pt will amb. 1000' over even/paved terrain with LRAD at MOD I level to improve functional mobility. TARGET DATE FOR ALL LTGS: 08/30/18    Status  New      PT LONG TERM GOAL #2   Title  Trial amb. without AD when appropriate and write goal as indicated.     Status  New      PT LONG TERM GOAL #3   Title  Pt will report no falls in the last 4 weeks to improve safety.     Status  New      PT LONG TERM GOAL #4   Title  Pt will improve BERG balance score to 38/56 in order to indicate decreased fall risk.     Time  8    Period  Weeks    Status  New            Plan - 07/10/18 1524    Clinical Impression Statement  Skilled session focused on balance with narrowed BOS, EC and also laterally shifting weight along with initiation of gait training with quad tip cane vs SBQC. Pt able to ambulate at same assist level with quad tip cane, therefore recommend she begin to practice at home with hands on assist from family (she has already tried at home).  Pt verbalized understanding.     Rehab Potential  Good    Clinical Impairments Affecting Rehab Potential  see above.     PT Frequency  2x / week    PT Duration  8 weeks    PT  Treatment/Interventions  ADLs/Self Care Home Management;Biofeedback;Electrical Stimulation;Therapeutic exercise;Therapeutic activities;Functional mobility training;Stair training;Orthotic Fit/Training;Gait training;Patient/family education;DME Instruction;Neuromuscular re-education;Balance training;Vestibular   e-stim with caution 2/2 impaired sensation   PT Next Visit Plan  Add to HEP for balance (wall bumps would be great for her), gait with quad tip cane, continue sit<>stand with emphasis on forward weight shift (may have to begin in elevated position), standing balance with emphasis on forward weight shift (lateral/foward), turns and negotiation around obstacles with cane    Consulted and Agree with Plan of Care  Patient;Family member/caregiver    Family Member Consulted  Lou: sister       Patient will benefit from skilled therapeutic intervention in order to improve the  following deficits and impairments:  Abnormal gait, Decreased endurance, Impaired sensation, Decreased knowledge of use of DME, Decreased strength, Decreased balance, Decreased mobility, Decreased range of motion, Decreased coordination  Visit Diagnosis: Muscle weakness (generalized)  Unsteadiness on feet  Other disturbances of skin sensation  Other abnormalities of gait and mobility     Problem List Patient Active Problem List   Diagnosis Date Noted  . Anxiety and depression 05/30/2018  . E. coli UTI 04/27/2018  . Acute blood loss anemia   . Acute deep vein thrombosis (DVT) of right femoral vein ([place])   . Hyponatremia   . Hypokalemia   . Hypomagnesemia   . Ileus, postoperative ([place])   . Thoracic myelopathy 03/21/2018  . Neurogenic bowel 03/21/2018  . Neurogenic bladder 03/21/2018  . Left wrist fracture, sequela 03/21/2018  . Numbness 02/15/2018  . Ataxic gait 02/15/2018  . Left leg weakness 02/15/2018  . Other fatigue 02/15/2018  . Urinary incontinence 02/15/2018  . Bowel incontinence 02/15/2018     Cameron Sprang, PT, MPT [facility] [address] [place], [place], [address] Phone: [phone]   Fax:  [phone] 07/10/18, 3:27 PM  Name: Hannah Jefferson MRN: [id number] Date of Birth: [birth year]

## 2018-07-10 NOTE — Patient Instructions (Signed)
Stand in corner with chair in front of you.   Feet Together, Arm Motion - Eyes Closed    With eyes closed and feet together, arms by your side.  Repeat _3__ times per session for 15-20 secs each. Do _2___ sessions per day.  Copyright  VHI. All rights reserved.

## 2018-07-11 ENCOUNTER — Ambulatory Visit: Payer: 59 | Admitting: Occupational Therapy

## 2018-07-11 ENCOUNTER — Encounter: Payer: Self-pay | Admitting: Occupational Therapy

## 2018-07-11 DIAGNOSIS — R2681 Unsteadiness on feet: Secondary | ICD-10-CM

## 2018-07-11 DIAGNOSIS — R2689 Other abnormalities of gait and mobility: Secondary | ICD-10-CM | POA: Diagnosis not present

## 2018-07-11 DIAGNOSIS — M6281 Muscle weakness (generalized): Secondary | ICD-10-CM

## 2018-07-11 DIAGNOSIS — R208 Other disturbances of skin sensation: Secondary | ICD-10-CM

## 2018-07-11 DIAGNOSIS — R278 Other lack of coordination: Secondary | ICD-10-CM

## 2018-07-11 NOTE — Therapy (Signed)
[facility] [address] [place], [place], [address] Phone: [phone]   Fax:  [phone]  Occupational Therapy Treatment  Patient Details  Name: Hannah Jefferson MRN: [id number] Date of Birth: 1956/04/01 Referring Provider: Dr Naaman Plummer   Encounter Date: 07/11/2018  OT End of Session - 07/11/18 1800    Visit Number  3    Number of Visits  17    Date for OT Re-Evaluation  09/03/18    Authorization Type  [employer] 20 visit limit OT    Authorization - Visit Number  3    Authorization - Number of Visits  20    OT Start Time  9379    OT Stop Time  1528    OT Time Calculation (min)  41 min    Activity Tolerance  Patient tolerated treatment well    Behavior During Therapy  [facility] for tasks assessed/performed       Past Medical History:  Diagnosis Date  . Chronic left hip pain   . GERD (gastroesophageal reflux disease)   . Gestational diabetes   . Hearing loss of both ears   . Hypertension   . Neuropathy    numbness/tingling from waist down  . Vision abnormalities     Past Surgical History:  Procedure Laterality Date  . IR IVC FILTER PLMT / S&I /IMG GUID/MOD SED  04/05/2018  . LAPAROSCOPIC CHOLECYSTECTOMY  1992  . REFRACTIVE SURGERY Bilateral 2004    There were no vitals filed for this visit.  Subjective Assessment - 07/11/18 1454    Subjective   I went to work today about 4.5 hours.    (Pended)     Patient is accompained by:  Family member  (Pended)    son [name]   Currently in Pain?  Yes  (Pended)     Pain Score  4   (Pended)     Pain Location  Back  (Pended)     Pain Orientation  Mid  (Pended)     Pain Descriptors / Indicators  Aching  (Pended)     Pain Type  Acute pain  (Pended)                    OT Treatments/Exercises (OP) - 07/11/18 0001      ADLs   Functional Mobility  Patient has started walking in therapy with cane (safety base tip)  Patient able to walk in home setting to load washer,  dryer, unload, carry laundry basket, carry groceries, etc., with close supervision.        Neurological Re-education Exercises   Other Exercises 1  Worked on balance and lower trunk initiated weight shifts. Patient seated on physioball to encourage ant/post/lateral weight shifts with decreased upper body response.               OT Education - 07/11/18 1759    Education Details  progress toward goals    Person(s) Educated  Patient;Child(ren)   son [name]   Methods  Explanation    Comprehension  Verbalized understanding       OT Short Term Goals - 07/11/18 1525      OT SHORT TERM GOAL #4   Title  Patient will don socks and shoes, and fasten/tie shoes with modified independence    Status  Achieved        OT Long Term Goals - 07/11/18 1526      OT LONG TERM GOAL #2   Title  Patient  will prepare a hot meal consisting of at least two items with no more than intermittent min assist    Status  Achieved            Plan - 07/11/18 1801    Clinical Impression Statement  Patient is on a solid progressive trajectory - making excellent functional strides.      Occupational Profile and client history currently impacting functional performance  Patient is married, worked full time as Glass blower/designer, very active in her church with excellent extended family support    Occupational performance deficits (Please refer to evaluation for details):  ADL's;IADL's;Work;Leisure    Rehab Potential  Excellent    OT Frequency  2x / week    OT Duration  8 weeks    OT Treatment/Interventions  Self-care/ADL training;Therapeutic exercise;Aquatic Therapy;Balance training;Therapeutic activities;Functional Mobility Training;DME and/or AE instruction;Energy conservation;Patient/family education;Neuromuscular education;Stair Training    Plan  functional mobility, stand tolerance, decrease UE reliance in standing - dynamic, kitchen or housekeeping task    Clinical Decision Making  Limited treatment  options, no task modification necessary    OT Home Exercise Plan  Theraputty    Consulted and Agree with Plan of Care  Patient;Family member/caregiver       Patient will benefit from skilled therapeutic intervention in order to improve the following deficits and impairments:  Decreased knowledge of use of DME, Pain, Decreased mobility, Impaired sensation, Improper body mechanics, Decreased activity tolerance, Decreased endurance, Decreased strength, Increased muscle spasms, Improper spinal/pelvic alignment, Decreased balance, Difficulty walking  Visit Diagnosis: Muscle weakness (generalized)  Unsteadiness on feet  Other disturbances of skin sensation  Other lack of coordination    Problem List Patient Active Problem List   Diagnosis Date Noted  . Anxiety and depression 05/30/2018  . E. coli UTI 04/27/2018  . Acute blood loss anemia   . Acute deep vein thrombosis (DVT) of right femoral vein ([place])   . Hyponatremia   . Hypokalemia   . Hypomagnesemia   . Ileus, postoperative ([place])   . Thoracic myelopathy 03/21/2018  . Neurogenic bowel 03/21/2018  . Neurogenic bladder 03/21/2018  . Left wrist fracture, sequela 03/21/2018  . Numbness 02/15/2018  . Ataxic gait 02/15/2018  . Left leg weakness 02/15/2018  . Other fatigue 02/15/2018  . Urinary incontinence 02/15/2018  . Bowel incontinence 02/15/2018    Mariah Milling, OTR/L 07/11/2018, 6:03 PM  [facility] [address] [place], [place], [address] Phone: [phone]   Fax:  [phone]  Name: Hannah Jefferson MRN: [id number] Date of Birth: [birth year]

## 2018-07-12 ENCOUNTER — Encounter: Payer: 59 | Attending: Physical Medicine & Rehabilitation | Admitting: Physical Medicine & Rehabilitation

## 2018-07-12 ENCOUNTER — Encounter: Payer: Self-pay | Admitting: Occupational Therapy

## 2018-07-12 ENCOUNTER — Encounter: Payer: Self-pay | Admitting: Physical Medicine & Rehabilitation

## 2018-07-12 ENCOUNTER — Ambulatory Visit: Payer: 59 | Admitting: Occupational Therapy

## 2018-07-12 VITALS — BP 143/94 | HR 88 | Ht 64.0 in | Wt 192.0 lb

## 2018-07-12 DIAGNOSIS — I10 Essential (primary) hypertension: Secondary | ICD-10-CM | POA: Insufficient documentation

## 2018-07-12 DIAGNOSIS — K219 Gastro-esophageal reflux disease without esophagitis: Secondary | ICD-10-CM | POA: Insufficient documentation

## 2018-07-12 DIAGNOSIS — F419 Anxiety disorder, unspecified: Secondary | ICD-10-CM | POA: Insufficient documentation

## 2018-07-12 DIAGNOSIS — G629 Polyneuropathy, unspecified: Secondary | ICD-10-CM | POA: Insufficient documentation

## 2018-07-12 DIAGNOSIS — Z8349 Family history of other endocrine, nutritional and metabolic diseases: Secondary | ICD-10-CM | POA: Insufficient documentation

## 2018-07-12 DIAGNOSIS — N319 Neuromuscular dysfunction of bladder, unspecified: Secondary | ICD-10-CM | POA: Insufficient documentation

## 2018-07-12 DIAGNOSIS — R278 Other lack of coordination: Secondary | ICD-10-CM

## 2018-07-12 DIAGNOSIS — M25552 Pain in left hip: Secondary | ICD-10-CM | POA: Insufficient documentation

## 2018-07-12 DIAGNOSIS — H9193 Unspecified hearing loss, bilateral: Secondary | ICD-10-CM | POA: Insufficient documentation

## 2018-07-12 DIAGNOSIS — Z833 Family history of diabetes mellitus: Secondary | ICD-10-CM | POA: Insufficient documentation

## 2018-07-12 DIAGNOSIS — F329 Major depressive disorder, single episode, unspecified: Secondary | ICD-10-CM | POA: Insufficient documentation

## 2018-07-12 DIAGNOSIS — K592 Neurogenic bowel, not elsewhere classified: Secondary | ICD-10-CM | POA: Diagnosis not present

## 2018-07-12 DIAGNOSIS — I82402 Acute embolism and thrombosis of unspecified deep veins of left lower extremity: Secondary | ICD-10-CM | POA: Insufficient documentation

## 2018-07-12 DIAGNOSIS — M6281 Muscle weakness (generalized): Secondary | ICD-10-CM

## 2018-07-12 DIAGNOSIS — Z825 Family history of asthma and other chronic lower respiratory diseases: Secondary | ICD-10-CM | POA: Insufficient documentation

## 2018-07-12 DIAGNOSIS — M4714 Other spondylosis with myelopathy, thoracic region: Secondary | ICD-10-CM | POA: Diagnosis not present

## 2018-07-12 DIAGNOSIS — G8220 Paraplegia, unspecified: Secondary | ICD-10-CM | POA: Insufficient documentation

## 2018-07-12 DIAGNOSIS — H539 Unspecified visual disturbance: Secondary | ICD-10-CM | POA: Insufficient documentation

## 2018-07-12 DIAGNOSIS — R2681 Unsteadiness on feet: Secondary | ICD-10-CM

## 2018-07-12 DIAGNOSIS — Z9049 Acquired absence of other specified parts of digestive tract: Secondary | ICD-10-CM | POA: Insufficient documentation

## 2018-07-12 DIAGNOSIS — R208 Other disturbances of skin sensation: Secondary | ICD-10-CM

## 2018-07-12 DIAGNOSIS — Z7901 Long term (current) use of anticoagulants: Secondary | ICD-10-CM | POA: Insufficient documentation

## 2018-07-12 DIAGNOSIS — Z8249 Family history of ischemic heart disease and other diseases of the circulatory system: Secondary | ICD-10-CM | POA: Insufficient documentation

## 2018-07-12 DIAGNOSIS — R2689 Other abnormalities of gait and mobility: Secondary | ICD-10-CM | POA: Diagnosis not present

## 2018-07-12 DIAGNOSIS — G8929 Other chronic pain: Secondary | ICD-10-CM | POA: Insufficient documentation

## 2018-07-12 DIAGNOSIS — Z818 Family history of other mental and behavioral disorders: Secondary | ICD-10-CM | POA: Insufficient documentation

## 2018-07-12 NOTE — Progress Notes (Signed)
Subjective:    Patient ID: Hannah Jefferson, female    DOB: January 18, 1956, 62 y.o.   MRN: [id number]  HPI   Hannah Jefferson is here in follow up of her thoracic SCI. She has increased her mobility and is walking over 200' now with her RW.  She still has some swelling in her legs and is wearing her compression stockings.  She asked if she should be wearing tighter stockings.  The left leg remains more swollen than the right.  She is on Pradaxa still for treatment of her clots.  She saw a hematologist regarding the origin of her thrombi.  Bowels and bladder are much improved.  She is having no issues with flow or output.  She denies constipation.  Hannah Jefferson has gone back to work on a limited basis as her brother is her boss.  She is worked an hour two at a time.  She is an Glass blower/designer.  Pain is minimal at this point.  Her mood has much improved.  She never needed the Xanax.  She is sleeping well.  Pain Inventory Average Pain 2 Pain Right Now 0 My pain is dull  In the last 24 hours, has pain interfered with the following? General activity 0 Relation with others 0 Enjoyment of life 0 What TIME of day is your pain at its worst? evening Sleep (in general) Good  Pain is worse with: sitting and standing Pain improves with: heat/ice, therapy/exercise and medication Relief from Meds: 10  Mobility walk without assistance use a cane use a walker ability to climb steps?  yes do you drive?  no  Function employed # of hrs/week . I need assistance with the following:  bathing, household duties and shopping  Neuro/Psych numbness tingling trouble walking spasms  Prior Studies Any changes since last visit?  no  Physicians involved in your care Any changes since last visit?  no   Family History  Problem Relation Age of Onset  . Dementia Mother   . COPD Mother   . Heart disease Father   . Thyroid nodules Sister   . Diabetes Mellitus II Sister   . Heart disease Brother   . Diabetes  Mellitus II Brother   . Diabetes Mellitus II Brother   . Heart disease Brother    Social History   Socioeconomic History  . Marital status: Married    Spouse name: Not on file  . Number of children: 2  . Years of education: Not on file  . Highest education level: Not on file  Occupational History  . Not on file  Social Needs  . Financial resource strain: Not on file  . Food insecurity:    Worry: Not on file    Inability: Not on file  . Transportation needs:    Medical: Not on file    Non-medical: Not on file  Tobacco Use  . Smoking status: Never Smoker  . Smokeless tobacco: Never Used  Substance and Sexual Activity  . Alcohol use: Never    Frequency: Never  . Drug use: Never  . Sexual activity: Not on file  Lifestyle  . Physical activity:    Days per week: Not on file    Minutes per session: Not on file  . Stress: Not on file  Relationships  . Social connections:    Talks on phone: Not on file    Gets together: Not on file    Attends religious service: Not on file    Active member  of club or organization: Not on file    Attends meetings of clubs or organizations: Not on file    Relationship status: Not on file  Other Topics Concern  . Not on file  Social History Narrative  . Not on file   Past Surgical History:  Procedure Laterality Date  . IR IVC FILTER PLMT / S&I /IMG GUID/MOD SED  04/05/2018  . LAPAROSCOPIC CHOLECYSTECTOMY  1992  . REFRACTIVE SURGERY Bilateral 2004   Past Medical History:  Diagnosis Date  . Chronic left hip pain   . GERD (gastroesophageal reflux disease)   . Gestational diabetes   . Hearing loss of both ears   . Hypertension   . Neuropathy    numbness/tingling from waist down  . Vision abnormalities    BP (!) 143/94   Pulse 88   Ht 5\' 4"  (1.626 m)   Wt 192 lb (87.1 kg)   SpO2 98%   BMI 32.96 kg/m   Opioid Risk Score:   Fall Risk Score:  `1  Depression screen PHQ 2/9  No flowsheet data found.   Review of Systems    Constitutional: Negative.   HENT: Negative.   Eyes: Negative.   Respiratory: Negative.   Cardiovascular: Negative.   Gastrointestinal: Negative.   Endocrine: Negative.   Genitourinary: Negative.   Musculoskeletal: Positive for gait problem, joint swelling and myalgias.  Skin: Negative.   Allergic/Immunologic: Negative.   Neurological: Positive for numbness.  Hematological: Negative.   Psychiatric/Behavioral: Negative.   All other systems reviewed and are negative.      Objective:   Physical Exam General: No acute distress HEENT: EOMI, oral membranes moist Cards: reg rate  Chest: normal effort Abdomen: Soft, NT, ND Skin: dry, intact Extremities: 1+ RLE Edema, 1++ LLE edema   Neurological: Motor B/l UE 5/5.  B/l LE 4+/5 HF, KE and 4+/5 ADF. Improved proprioception in both legs. Ambulated easily with walker. Without walker was able to take several steps but lost balance and had difficulties with position sens  Psychiatric: up beat and pleasant       Assessment & Plan:  1.Paraplegia and funtional deficitssecondary to T8 meningioma/resection with myeopathy -HH therapies--->outpt rehab over next 4 -6 weeks or so             -continues to make outstanding gains.    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 Would advise return to work (when she completes therapy) at a 4 hour per day level.  -provided her written guidelines for return to driving. She will work on some considerations with OT as well at neuro-rehab 2. DVT Right common femoral vein (mobile), right posterior tibial and peroneal vein thrombi: -now with LLE DVT             -pradaxa             -she has seen a hematologist.   -recommend continuing pradaxa for 3-6 months after resolution of thrombus depending upon her mobility level at that time  -spoke to interventional radiology who states that her [facility] IVCF can remain in up to a year or more and still  be retrieved. She can be on pradaxa as well for procedure. 3. Pain Management: prn tyelnol 4. Mood:much improved             -continue lexapro             -anxiety improved!  5. neurogenic bowel: -essentially back to normal 9.Neurogenic bladder:  voiding  Normally 10. GERD: PPI    30 minutes of face to face patient care time were spent during this visit. All questions were encouraged and answered. Follow up with me in about 3 months. Greater than 50% of time during this encounter was spent counseling patient/family in regard to DVT rx and treatment considerations. Marland Kitchen  Marland Kitchen

## 2018-07-12 NOTE — Therapy (Signed)
[facility] [address] [place], [place], [address] Phone: [phone]   Fax:  [phone]  Occupational Therapy Treatment  Patient Details  Name: Hannah Jefferson MRN: [id number] Date of Birth: 27-Nov-1955 Referring Provider: Dr Naaman Plummer   Encounter Date: 07/12/2018  OT End of Session - 07/12/18 1455    Visit Number  4    Number of Visits  17    Date for OT Re-Evaluation  09/03/18    Authorization Type  [employer] 20 visit limit OT    Authorization - Visit Number  4    Authorization - Number of Visits  20    OT Start Time  1315    OT Stop Time  1404    OT Time Calculation (min)  49 min    Activity Tolerance  Patient tolerated treatment well    Behavior During Therapy  [facility] for tasks assessed/performed       Past Medical History:  Diagnosis Date  . Chronic left hip pain   . GERD (gastroesophageal reflux disease)   . Gestational diabetes   . Hearing loss of both ears   . Hypertension   . Neuropathy    numbness/tingling from waist down  . Vision abnormalities     Past Surgical History:  Procedure Laterality Date  . IR IVC FILTER PLMT / S&I /IMG GUID/MOD SED  04/05/2018  . LAPAROSCOPIC CHOLECYSTECTOMY  1992  . REFRACTIVE SURGERY Bilateral 2004    There were no vitals filed for this visit.  Subjective Assessment - 07/12/18 1317    Subjective   I saw Dr Naaman Plummer this morning    Patient is accompained by:  Family member    Currently in Pain?  No/denies    Pain Score  0-No pain                   OT Treatments/Exercises (OP) - 07/12/18 0001      ADLs   Driving  Reviewed MD's recommendations regarding driving.  Set up simulated pedals for brake, gas to assess RLE control.  Patient's sister in law was a Press photographer in the past, and agrees to attempt graduated driving - planning parking lot this weekend.        Neurological Re-education Exercises   Other Exercises 1  Neuromuscular reeducation to  address lower trunk initiated weight shifts and balance.  Patient with improved excursion in all directions today.  Patient with significant numbness in left hip/thigh so difficulty maintaining midline while seated on physioball.       Other Exercises 2  Worked on transitional movements - 4 point to side sit to either side.  PAtient hopes to be able to get down onto floor to play with niece.  Patient with significant LLE weakness compared to RLE duriing this transition.               OT Education - 07/12/18 1455    Education Details  graduated driving program    Person(s) Educated  Patient;Caregiver(s)    Methods  Explanation;Demonstration    Comprehension  Verbalized understanding;Returned demonstration       OT Short Term Goals - 07/11/18 1525      OT SHORT TERM GOAL #4   Title  Patient will don socks and shoes, and fasten/tie shoes with modified independence    Status  Achieved        OT Long Term Goals - 07/11/18 1526      OT LONG TERM GOAL #  2   Title  Patient will prepare a hot meal consisting of at least two items with no more than intermittent min assist    Status  Achieved            Plan - 07/12/18 1455    Clinical Impression Statement  Patient is on a solid progressive trajectory - making excellent functional strides.      Occupational Profile and client history currently impacting functional performance  Patient is married, worked full time as Glass blower/designer, very active in her church with excellent extended family support    Occupational performance deficits (Please refer to evaluation for details):  ADL's;IADL's;Work;Leisure    Rehab Potential  Excellent    OT Frequency  2x / week    OT Duration  8 weeks    OT Treatment/Interventions  Self-care/ADL training;Therapeutic exercise;Aquatic Therapy;Balance training;Therapeutic activities;Functional Mobility Training;DME and/or AE instruction;Energy conservation;Patient/family education;Neuromuscular  education;Stair Training    Plan  functional mobility, stand tolerance, decrease UE reliance in standing - dynamic, kitchen or housekeeping task    Clinical Decision Making  Limited treatment options, no task modification necessary    OT Home Exercise Plan  Theraputty    Consulted and Agree with Plan of Care  Patient;Family member/caregiver       Patient will benefit from skilled therapeutic intervention in order to improve the following deficits and impairments:  Decreased knowledge of use of DME, Pain, Decreased mobility, Impaired sensation, Improper body mechanics, Decreased activity tolerance, Decreased endurance, Decreased strength, Increased muscle spasms, Improper spinal/pelvic alignment, Decreased balance, Difficulty walking  Visit Diagnosis: Muscle weakness (generalized)  Unsteadiness on feet  Other disturbances of skin sensation  Other lack of coordination    Problem List Patient Active Problem List   Diagnosis Date Noted  . Anxiety and depression 05/30/2018  . E. coli UTI 04/27/2018  . Acute blood loss anemia   . Acute deep vein thrombosis (DVT) of right femoral vein ([place])   . Hyponatremia   . Hypokalemia   . Hypomagnesemia   . Ileus, postoperative ([place])   . Thoracic myelopathy 03/21/2018  . Neurogenic bowel 03/21/2018  . Neurogenic bladder 03/21/2018  . Left wrist fracture, sequela 03/21/2018  . Numbness 02/15/2018  . Ataxic gait 02/15/2018  . Left leg weakness 02/15/2018  . Other fatigue 02/15/2018  . Urinary incontinence 02/15/2018  . Bowel incontinence 02/15/2018    Mariah Milling, OTR/L 07/12/2018, 2:57 PM  [facility] [address] [place], [place], [address] Phone: [phone]   Fax:  [phone]  Name: ADELL KOVAL MRN: [id number] Date of Birth: 07-20-1956

## 2018-07-12 NOTE — Patient Instructions (Addendum)
PLEASE FEEL FREE TO CALL OUR OFFICE WITH ANY PROBLEMS OR QUESTIONS ([phone])   ONCE YOU'RE DONE WITH THERAPY, I WOULD RECOMMEND RETURNING TO WORK 4 HOURS PER DAY TO START. YOU MAY INCREASE YOUR ACTIVITY AND HOURS AS TOLERATED FROM THERE    RETURN TO DRIVING PLAN:  WITH THE SUPERVISION OF A LICENSED DRIVER, PLEASE DRIVE IN AN EMPTY PARKING LOT FOR AT LEAST 2-3 TRIALS TO TEST REACTION TIME, VISION, USE OF EQUIPMENT IN CAR, ETC.  IF SUCCESSFUL WITH THE PARKING LOT DRIVING, PROCEED TO SUPERVISED DRIVING TRIALS IN YOUR NEIGHBORHOOD STREETS AT LOW TRAFFIC TIMES TO TEST OBSERVATION TO TRAFFIC SIGNALS, REACTION TIME, ETC. PLEASE ATTEMPT AT LEAST 2-3 TRIALS IN YOUR NEIGHBORHOOD.  IF NEIGHBORHOOD DRIVING IS SUCCESSFUL, YOU MAY PROCEED TO DRIVING IN BUSIER AREAS IN YOUR COMMUNITY WITH SUPERVISION OF A LICENSED DRIVER. PLEASE ATTEMPT AT LEAST 4-5 TRIALS.  IF COMMUNITY DRIVING IS SUCCESSFUL, YOU MAY PROCEED TO DRIVING ALONE, DURING THE DAY TIME, IN NON-PEAK TRAFFIC TIMES. YOU SHOULD DRIVE NO FURTHER THAN 30 MINUTES IN ONE DIRECTION. PLEASE DO NOT DRIVE IF YOU FEEL FATIGUED OR UNDER THE INFLUENCE OF MEDICATION.

## 2018-07-14 ENCOUNTER — Encounter: Payer: Self-pay | Admitting: Rehabilitation

## 2018-07-14 ENCOUNTER — Encounter: Payer: 59 | Admitting: Occupational Therapy

## 2018-07-14 ENCOUNTER — Ambulatory Visit: Payer: 59 | Admitting: Rehabilitation

## 2018-07-14 DIAGNOSIS — R208 Other disturbances of skin sensation: Secondary | ICD-10-CM

## 2018-07-14 DIAGNOSIS — R2689 Other abnormalities of gait and mobility: Secondary | ICD-10-CM | POA: Diagnosis not present

## 2018-07-14 DIAGNOSIS — M6281 Muscle weakness (generalized): Secondary | ICD-10-CM

## 2018-07-14 DIAGNOSIS — R2681 Unsteadiness on feet: Secondary | ICD-10-CM

## 2018-07-14 NOTE — Therapy (Signed)
[facility] [address] [place], [place], [address] Phone: [phone]   Fax:  [phone]  Physical Therapy Treatment  Patient Details  Name: Hannah Jefferson MRN: [id number] Date of Birth: October 01, 1956 Referring Provider: Dr Naaman Plummer   Encounter Date: 07/14/2018  PT End of Session - 07/14/18 1534    Visit Number  4    Number of Visits  17    Date for PT Re-Evaluation  09/03/18    Authorization Type  UHC: 20 visits PT and 20 visits OT    Authorization - Visit Number  4    Authorization - Number of Visits  20    PT Start Time  1150    PT Stop Time  1235    PT Time Calculation (min)  45 min    Equipment Utilized During Treatment  --   S for safety   Activity Tolerance  Patient tolerated treatment well    Behavior During Therapy  [facility] for tasks assessed/performed       Past Medical History:  Diagnosis Date  . Chronic left hip pain   . GERD (gastroesophageal reflux disease)   . Gestational diabetes   . Hearing loss of both ears   . Hypertension   . Neuropathy    numbness/tingling from waist down  . Vision abnormalities     Past Surgical History:  Procedure Laterality Date  . IR IVC FILTER PLMT / S&I /IMG GUID/MOD SED  04/05/2018  . LAPAROSCOPIC CHOLECYSTECTOMY  1992  . REFRACTIVE SURGERY Bilateral 2004    There were no vitals filed for this visit.  Subjective Assessment - 07/14/18 1157    Subjective  Reports visit with Naaman Plummer went well, had her walk without AD.     Patient is accompained by:  Family member    Pertinent History  B DVTs s/p 4/20019 surgery with IVC filter in place, hx of gestation DM only, HTN, Chronic L hip pain, scoliosis, hiatal hernia    Patient Stated Goals  I want to get back as much as possible.     Currently in Pain?  No/denies                       [facility] Adult PT Treatment/Exercise - 07/14/18 1155      Ambulation/Gait   Ambulation/Gait  Yes    Ambulation/Gait  Assistance  4: Min guard;4: Min assist    Ambulation/Gait Assistance Details  Continued gait with quad tip cane during session trying to go from three point gait pattern to two point gait pattern.  Pt initially using 3 point with cues for improved sequencing, posture and L arm swing.  PT provided light HHA on L side to transition to two point gait pattern with good return demo and pt reporting it feels "better and more stable this way."  Will continue to address.     Ambulation Distance (Feet)  230 Feet    Assistive device  Straight cane   with quad tip    Gait Pattern  Step-through pattern;Decreased stride length;Decreased dorsiflexion - right;Decreased dorsiflexion - left    Ambulation Surface  Level;Indoor      Therapeutic Activites    Therapeutic Activities  Other Therapeutic Activities    Other Therapeutic Activities  Performed floor recovery x 1 rep during session from sitting>half kneel>tall kneel>quadruped>side sit and back to mat.  Pt able to do all transitions at S to min A level, however did require slightly more assist  for half kneel back to mat.        Neuro Re-ed    Neuro Re-ed Details   NMR tasks in tall kneeling, tall >half kneel trasitions and quadruped for improved proximal hip control and muscle activation along with improved core activation.  Began in tall kneeling with light UE support on physioball performing mini squats x 10 reps with cues for improved B hip extension progressing to no UE support x 5 more reps.  Tall kneel to half kneel transitions x 5 reps on each side.  Attempted to do with only support on physioball, however this was too difficult esp when trying to elevate RLE due to LLE weakness and impaired sensation, therefore had her support UEs on [place] bench while performing transitions. Pt tolerated well, but relies on UE support quite a bit esp when in stance on LLE.  Provided tactile and verbal cues throughout for improved stance control.  Also performed transitions  from side prop<>quadruped during session to improve trunk flex/rotation.  Pt able to do at S level.  Transitioned to quadruped alternating UEs x 10 reps>attempting to alternate LE into extension, however this was too difficult, therefore modified to having her slightly lift knee into abd and return to midline x 10 reps total and finally alternating UE extension with LE abd (as mentioned before) x 5 reps total.  Note that all NMR tasks also to prepare for transitions needing to be made to do floor transfer.  Therefore ended with floor recovery.  See TA.              PT Education - 07/14/18 1534    Education Details  floor recovery     Person(s) Educated  Patient;Child(ren)    Methods  Explanation;Demonstration    Comprehension  Verbalized understanding;Returned demonstration       PT Short Term Goals - 07/06/18 1505      PT SHORT TERM GOAL #1   Title  Pt will be IND in HEP to improve deficits listed above. TARGET DATE FOR ALL STGS: 08/02/18    Status  New      PT SHORT TERM GOAL #2   Title  Pt will improve TUG time with LRAD to </=13.5sec. to decr. falls risk.     Status  New      PT SHORT TERM GOAL #3   Title  Pt will improve gait speed to >/=2.71ft/sec, with LRAD to safely amb. in the community.     Status  New      PT SHORT TERM GOAL #4   Title  Pt will amb. 500' over even terrain, with LRAD at MOD I Level to improve safety during functional mobility.     Status  New      PT SHORT TERM GOAL #5   Title  Perform BERG and write STG and LTG    Baseline  30/56    Status  Achieved      Additional Short Term Goals   Additional Short Term Goals  Yes      PT SHORT TERM GOAL #6   Title  Pt will improve BERG balance test to 34/56 in order to indicate decreased fall risk.     Time  4    Period  Weeks    Status  New        PT Long Term Goals - 07/14/18 1537      PT LONG TERM GOAL #1   Title  Pt will amb. 1000' over  even/paved terrain with LRAD at MOD I level to improve  functional mobility. TARGET DATE FOR ALL LTGS: 08/30/18    Status  New      PT LONG TERM GOAL #2   Title  Trial amb. without AD when appropriate and write goal as indicated.     Status  New      PT LONG TERM GOAL #3   Title  Pt will report no falls in the last 4 weeks to improve safety.     Status  New      PT LONG TERM GOAL #4   Title  Pt will improve BERG balance score to 38/56 in order to indicate decreased fall risk.     Time  8    Period  Weeks    Status  New      PT LONG TERM GOAL #5   Title  Pt will perform floor recovery tranfser at S level in order to indicate safety in case of fall but also to ensure safety when playing in floor with family.     Time  8    Period  Weeks    Status  New            Plan - 07/14/18 1534    Clinical Impression Statement  Skilled session focused on NMR with transitional movements into tall kneeling, half kneeling, quadruped and side sit to/from quadruped for improved proximal LE motor control/activation, core activation and to prepare for floor transfer.  She did perform single floor transfer at min to mod A and will benefit from continued practice.      Rehab Potential  Good    Clinical Impairments Affecting Rehab Potential  see above.     PT Frequency  2x / week    PT Duration  8 weeks    PT Treatment/Interventions  ADLs/Self Care Home Management;Biofeedback;Electrical Stimulation;Therapeutic exercise;Therapeutic activities;Functional mobility training;Stair training;Orthotic Fit/Training;Gait training;Patient/family education;DME Instruction;Neuromuscular re-education;Balance training;Vestibular   e-stim with caution 2/2 impaired sensation   PT Next Visit Plan  Continue floor transfer, transitional movements, Add to HEP for balance (wall bumps would be great for her), gait with quad tip cane, continue sit<>stand with emphasis on forward weight shift (may have to begin in elevated position), standing balance with emphasis on forward weight  shift (lateral/foward), turns and negotiation around obstacles with cane    Consulted and Agree with Plan of Care  Patient;Family member/caregiver    Family Member Consulted  son Thedore Mins       Patient will benefit from skilled therapeutic intervention in order to improve the following deficits and impairments:  Abnormal gait, Decreased endurance, Impaired sensation, Decreased knowledge of use of DME, Decreased strength, Decreased balance, Decreased mobility, Decreased range of motion, Decreased coordination  Visit Diagnosis: Muscle weakness (generalized)  Unsteadiness on feet  Other disturbances of skin sensation  Other abnormalities of gait and mobility     Problem List Patient Active Problem List   Diagnosis Date Noted  . Anxiety and depression 05/30/2018  . E. coli UTI 04/27/2018  . Acute blood loss anemia   . Acute deep vein thrombosis (DVT) of right femoral vein ([place])   . Hyponatremia   . Hypokalemia   . Hypomagnesemia   . Ileus, postoperative ([place])   . Thoracic myelopathy 03/21/2018  . Neurogenic bowel 03/21/2018  . Neurogenic bladder 03/21/2018  . Left wrist fracture, sequela 03/21/2018  . Numbness 02/15/2018  . Ataxic gait 02/15/2018  . Left leg weakness 02/15/2018  . Other  fatigue 02/15/2018  . Urinary incontinence 02/15/2018  . Bowel incontinence 02/15/2018    Cameron Sprang, PT, MPT [facility] [address] [place], [place], [address] Phone: [phone]   Fax:  [phone] 07/14/18, 3:39 PM  Name: Hannah Jefferson MRN: [id number] Date of Birth: Apr 12, 1956

## 2018-07-18 ENCOUNTER — Ambulatory Visit: Payer: 59 | Attending: Family Medicine | Admitting: Rehabilitation

## 2018-07-18 ENCOUNTER — Ambulatory Visit: Payer: 59 | Admitting: Occupational Therapy

## 2018-07-18 ENCOUNTER — Encounter: Payer: Self-pay | Admitting: Rehabilitation

## 2018-07-18 ENCOUNTER — Encounter: Payer: Self-pay | Admitting: Occupational Therapy

## 2018-07-18 DIAGNOSIS — R2681 Unsteadiness on feet: Secondary | ICD-10-CM

## 2018-07-18 DIAGNOSIS — R278 Other lack of coordination: Secondary | ICD-10-CM | POA: Diagnosis present

## 2018-07-18 DIAGNOSIS — R2689 Other abnormalities of gait and mobility: Secondary | ICD-10-CM | POA: Diagnosis present

## 2018-07-18 DIAGNOSIS — R208 Other disturbances of skin sensation: Secondary | ICD-10-CM

## 2018-07-18 DIAGNOSIS — M6281 Muscle weakness (generalized): Secondary | ICD-10-CM

## 2018-07-18 NOTE — Therapy (Signed)
[facility] [address] [place], [place], [address] Phone: [phone]   Fax:  [phone]  Occupational Therapy Treatment  Patient Details  Name: Hannah Jefferson MRN: [id number] Date of Birth: [birth year] Referring Provider: Dr Naaman Plummer   Encounter Date: 07/18/2018  OT End of Session - 07/18/18 1200    Visit Number  5    Number of Visits  17    Date for OT Re-Evaluation  09/03/18    Authorization Type  UHC 20 visit limit OT    Authorization - Visit Number  5    Authorization - Number of Visits  20    OT Start Time  1107    OT Stop Time  1145    OT Time Calculation (min)  38 min    Activity Tolerance  Patient tolerated treatment well    Behavior During Therapy  [facility] for tasks assessed/performed       Past Medical History:  Diagnosis Date  . Chronic left hip pain   . GERD (gastroesophageal reflux disease)   . Gestational diabetes   . Hearing loss of both ears   . Hypertension   . Neuropathy    numbness/tingling from waist down  . Vision abnormalities     Past Surgical History:  Procedure Laterality Date  . IR IVC FILTER PLMT / S&I /IMG GUID/MOD SED  04/05/2018  . LAPAROSCOPIC CHOLECYSTECTOMY  1992  . REFRACTIVE SURGERY Bilateral 2004    There were no vitals filed for this visit.  Subjective Assessment - 07/18/18 1111    Subjective   I am sorry I am a few minutes late - traffic    Patient is accompained by:  Family member    Currently in Pain?  No/denies    Pain Score  0-No pain                   OT Treatments/Exercises (OP) - 07/18/18 0001      ADLs   Cooking  Patient has started cooking for family.  Encouraged her to begin to utilize cane in functional situations, like cooking, if family member present.      Driving  Patient was unable to practice driving this weekend due to schedule conflicts - lots of family visiting.        Neurological Re-education Exercises   Other Exercises 1   Neuromuscular reeducation to address postural control, and lower trunk initiated weight shifts.  Seated on physioball working on Bilateral PNF diagonals with increasing complexity - increased weight increased range of motion, decreased support for ball movement.  Patient needs cueing consistently to correct drift to right side in sitting on compliant surface.               OT Education - 07/18/18 1159    Education Details  role of sensation in motor control, compensation for decreased sensation (vision)    Person(s) Educated  Patient;Caregiver(s)    Methods  Explanation;Demonstration    Comprehension  Verbalized understanding;Returned demonstration       OT Short Term Goals - 07/18/18 1201      OT SHORT TERM GOAL #1   Title  Patient will complete a home exercise program designed to improve grip strength - due 08/04/18    Status  On-going      OT SHORT TERM GOAL #2   Title  Patient wil stand in shower from tub transfer bench in shower to wash per[place] with supervision    Status  On-going      OT SHORT TERM GOAL #3   Title  Patient will demonstrate understanding of graduated driving program, and/or driving resources to prepare for return to driving    Status  Achieved      OT [place] #4   Title  Patient will don socks and shoes, and fasten/tie shoes with modified independence    Status  Achieved        OT Long Term Goals - 07/11/18 1526      OT LONG TERM GOAL #2   Title  Patient will prepare a hot meal consisting of at least two items with no more than intermittent min assist    Status  Achieved            Plan - 07/18/18 1200    Clinical Impression Statement  Patient continues to show solid progress toward her OT goals due to improved postural control, balance, and activity tolerance.      Occupational Profile and client history currently impacting functional performance  Patient is married, worked full time as Glass blower/designer, very active in her church with  excellent extended family support    Occupational performance deficits (Please refer to evaluation for details):  ADL's;IADL's;Work;Leisure    Rehab Potential  Excellent    OT Frequency  2x / week    OT Duration  8 weeks    OT Treatment/Interventions  Self-care/ADL training;Therapeutic exercise;Aquatic Therapy;Balance training;Therapeutic activities;Functional Mobility Training;DME and/or AE instruction;Energy conservation;Patient/family education;Neuromuscular education;Stair Training    Plan  functional mobility, stand tolerance, decrease UE reliance in standing - dynamic, kitchen or housekeeping task    Clinical Decision Making  Limited treatment options, no task modification necessary    OT Home Exercise Plan  Theraputty    Consulted and Agree with Plan of Care  Patient;Family member/caregiver       Patient will benefit from skilled therapeutic intervention in order to improve the following deficits and impairments:  Decreased knowledge of use of DME, Pain, Decreased mobility, Impaired sensation, Improper body mechanics, Decreased activity tolerance, Decreased endurance, Decreased strength, Increased muscle spasms, Improper spinal/pelvic alignment, Decreased balance, Difficulty walking  Visit Diagnosis: Muscle weakness (generalized)  Unsteadiness on feet  Other disturbances of skin sensation  Other lack of coordination    Problem List Patient Active Problem List   Diagnosis Date Noted  . Anxiety and depression 05/30/2018  . E. coli UTI 04/27/2018  . Acute blood loss anemia   . Acute deep vein thrombosis (DVT) of right femoral vein ([place])   . Hyponatremia   . Hypokalemia   . Hypomagnesemia   . Ileus, postoperative ([place])   . Thoracic myelopathy 03/21/2018  . Neurogenic bowel 03/21/2018  . Neurogenic bladder 03/21/2018  . Left wrist fracture, sequela 03/21/2018  . Numbness 02/15/2018  . Ataxic gait 02/15/2018  . Left leg weakness 02/15/2018  . Other fatigue 02/15/2018  .  Urinary incontinence 02/15/2018  . Bowel incontinence 02/15/2018    Mariah Milling, OTR/L 07/18/2018, 12:02 PM  [facility] [address] [place], [place], [address] Phone: [phone]   Fax:  [phone]  Name: Hannah Jefferson MRN: [id number] Date of Birth: July 21, 1956

## 2018-07-18 NOTE — Therapy (Signed)
[facility] [address] [place], [place], [address] Phone: [phone]   Fax:  [phone]  Physical Therapy Treatment  Patient Details  Name: Hannah Jefferson MRN: [id number] Date of Birth: Jan 01, 1956 Referring Provider: Dr Naaman Plummer   Encounter Date: 07/18/2018  PT End of Session - 07/18/18 1155    Visit Number  5    Number of Visits  17    Date for PT Re-Evaluation  09/03/18    Authorization Type  UHC: 20 visits PT and 20 visits OT    Authorization - Visit Number  5    Authorization - Number of Visits  20    PT Start Time  5397    PT Stop Time  1232    PT Time Calculation (min)  44 min    Equipment Utilized During Treatment  --   S for safety   Activity Tolerance  Patient tolerated treatment well    Behavior During Therapy  [facility] for tasks assessed/performed       Past Medical History:  Diagnosis Date  . Chronic left hip pain   . GERD (gastroesophageal reflux disease)   . Gestational diabetes   . Hearing loss of both ears   . Hypertension   . Neuropathy    numbness/tingling from waist down  . Vision abnormalities     Past Surgical History:  Procedure Laterality Date  . IR IVC FILTER PLMT / S&I /IMG GUID/MOD SED  04/05/2018  . LAPAROSCOPIC CHOLECYSTECTOMY  1992  . REFRACTIVE SURGERY Bilateral 2004    There were no vitals filed for this visit.  Subjective Assessment - 07/18/18 1154    Subjective  Pt reports doing a lot of walking over the weekend.  Also went shopping using shopping cart.      Patient is accompained by:  Family member    Pertinent History  B DVTs s/p 4/20019 surgery with IVC filter in place, hx of gestation DM only, HTN, Chronic L hip pain, scoliosis, hiatal hernia    Patient Stated Goals  I want to get back as much as possible.     Currently in Pain?  No/denies                       [facility] Adult PT Treatment/Exercise - 07/18/18 1209      Ambulation/Gait    Ambulation/Gait  Yes    Ambulation/Gait Assistance  4: Min guard    Ambulation/Gait Assistance Details  Continue to work on gait with quad tip cane with two point gait pattern.  Pt initially more unsteadiness and needs slightly more assist to get into straight path, however once on therapy track in straight path is able to continue with two point pattern at min/guard level.  Even on therapy track note difficulty when making turns.  Will continue to address.     Ambulation Distance (Feet)  230 Feet    Assistive device  Straight cane   w/ quad tip   Gait Pattern  Step-through pattern;Decreased stride length;Decreased dorsiflexion - right;Decreased dorsiflexion - left    Ambulation Surface  Level;Indoor    Stairs  Yes    Stairs Assistance  4: Min guard;4: Min assist    Stairs Assistance Details (indicate cue type and reason)  Assessed stairs during session in reciprocal pattern to ascend, and descend, however due to decreased strength with eccentric control, recommend step to pattern for descent at this time due to safety concerns with reciprocal pattern.  Pt verbalized understanding, but did well ascending with cane and single rail during session.     Stair Management Technique  One rail Left;Alternating pattern;Step to pattern;Forwards;With cane    Number of Stairs  4   x 3 reps   Height of Stairs  6      Therapeutic Activites    Therapeutic Activities  Other Therapeutic Activities    Other Therapeutic Activities  Continued to address floor to mat transfers during session.  Pt performed x 2 reps.  She is able to do all at S level however once in half kneel, requires min/mod A to elevate to mat to seated position.  Cues for attempting to increase use of RLE for push to elevate to mat.       Exercises   Exercises  Other Exercises    Other Exercises   lateral side stepping in lunges x 30' x 2 reps, squats x 10 reps with cues for technique and visual cue of "aiming towards mat" therefore encouraged  chair behind her at home.  Also performed x 5 reps lightly tapping buttocks to elevated mat and returning to stand.               PT Education - 07/18/18 1614    Education Details  squats added to HEP    Person(s) Educated  Patient    Methods  Explanation;Demonstration    Comprehension  Verbalized understanding;Returned demonstration       PT Short Term Goals - 07/06/18 1505      PT SHORT TERM GOAL #1   Title  Pt will be IND in HEP to improve deficits listed above. TARGET DATE FOR ALL STGS: 08/02/18    Status  New      PT SHORT TERM GOAL #2   Title  Pt will improve TUG time with LRAD to </=13.5sec. to decr. falls risk.     Status  New      PT SHORT TERM GOAL #3   Title  Pt will improve gait speed to >/=2.40ft/sec, with LRAD to safely amb. in the community.     Status  New      PT SHORT TERM GOAL #4   Title  Pt will amb. 500' over even terrain, with LRAD at MOD I Level to improve safety during functional mobility.     Status  New      PT SHORT TERM GOAL #5   Title  Perform BERG and write STG and LTG    Baseline  30/56    Status  Achieved      Additional Short Term Goals   Additional Short Term Goals  Yes      PT SHORT TERM GOAL #6   Title  Pt will improve BERG balance test to 34/56 in order to indicate decreased fall risk.     Time  4    Period  Weeks    Status  New        PT Long Term Goals - 07/14/18 1537      PT LONG TERM GOAL #1   Title  Pt will amb. 1000' over even/paved terrain with LRAD at MOD I level to improve functional mobility. TARGET DATE FOR ALL LTGS: 08/30/18    Status  New      PT LONG TERM GOAL #2   Title  Trial amb. without AD when appropriate and write goal as indicated.     Status  New      PT LONG  TERM GOAL #3   Title  Pt will report no falls in the last 4 weeks to improve safety.     Status  New      PT LONG TERM GOAL #4   Title  Pt will improve BERG balance score to 38/56 in order to indicate decreased fall risk.     Time  8     Period  Weeks    Status  New      PT LONG TERM GOAL #5   Title  Pt will perform floor recovery tranfser at S level in order to indicate safety in case of fall but also to ensure safety when playing in floor with family.     Time  8    Period  Weeks    Status  New            Plan - 07/18/18 1155    Clinical Impression Statement  Skilled session focused on BLE strengthening, continuing gait with quad tip cane for 2 point gait pattern, stairs with decreasing UE support/use of cane, and floor recovery for both safety and strengthening.  Pt continues to make excellent progress towards goals.     Rehab Potential  Good    Clinical Impairments Affecting Rehab Potential  see above.     PT Frequency  2x / week    PT Duration  8 weeks    PT Treatment/Interventions  ADLs/Self Care Home Management;Biofeedback;Electrical Stimulation;Therapeutic exercise;Therapeutic activities;Functional mobility training;Stair training;Orthotic Fit/Training;Gait training;Patient/family education;DME Instruction;Neuromuscular re-education;Balance training;Vestibular   e-stim with caution 2/2 impaired sensation   PT Next Visit Plan  Continue floor transfer, transitional movements, Add to HEP for balance (wall bumps would be great for her-also give combined handout made at last session), gait with quad tip cane, continue sit<>stand with emphasis on forward weight shift (may have to begin in elevated position), standing balance with emphasis on forward weight shift (lateral/foward), turns and negotiation around obstacles with cane    Consulted and Agree with Plan of Care  Patient;Family member/caregiver    Family Member Consulted  sister in law       Patient will benefit from skilled therapeutic intervention in order to improve the following deficits and impairments:  Abnormal gait, Decreased endurance, Impaired sensation, Decreased knowledge of use of DME, Decreased strength, Decreased balance, Decreased mobility,  Decreased range of motion, Decreased coordination  Visit Diagnosis: Muscle weakness (generalized)  Unsteadiness on feet  Other disturbances of skin sensation  Other abnormalities of gait and mobility     Problem List Patient Active Problem List   Diagnosis Date Noted  . Anxiety and depression 05/30/2018  . E. coli UTI 04/27/2018  . Acute blood loss anemia   . Acute deep vein thrombosis (DVT) of right femoral vein ([place])   . Hyponatremia   . Hypokalemia   . Hypomagnesemia   . Ileus, postoperative ([place])   . Thoracic myelopathy 03/21/2018  . Neurogenic bowel 03/21/2018  . Neurogenic bladder 03/21/2018  . Left wrist fracture, sequela 03/21/2018  . Numbness 02/15/2018  . Ataxic gait 02/15/2018  . Left leg weakness 02/15/2018  . Other fatigue 02/15/2018  . Urinary incontinence 02/15/2018  . Bowel incontinence 02/15/2018    Cameron Sprang, PT, MPT [facility] [address] [place], [place], [address] Phone: [phone]   Fax:  [phone] 07/18/18, 4:18 PM  Name: Hannah Jefferson MRN: [id number] Date of Birth: 03-22-1956

## 2018-07-18 NOTE — Patient Instructions (Addendum)
Stand in corner with chair in front of you.   Feet Together, Arm Motion - Eyes Closed    With eyes closed and feet together, arms by your side.  Repeat _3__ times per session for 15-20 secs each. Do _2___ sessions per day.  Copyright  VHI. All rights reserved.   Squat: Half    Arms hanging at sides, squat by dropping hips back as if sitting on a chair (place chair behind you for a visual cue).  Keep knees over feet. Repeat ___10_ times per set. Do __1__ sets per session. Do __2__ sessions per week. Don't worry about holding weights.  Copyright  VHI. All rights reserved.  Functional Quadriceps: Sit to Stand    Sit on edge of chair, feet flat on floor. Stand upright, extending knees fully.  Shift weight forward and try not to let legs hit back of chair.  Have walker in front of you for safety.  Repeat __10__ times per set. Do __10_ sets per session. Do __1-2_ sessions per day.  http://orth.exer.us/735   Copyright  VHI. All rights reserved.    Drop / Hold    Feet shoulder width apart, relax leg muscles and drop _8-10__ inches. Stop the downward motion and hold a squat _2-3__ seconds.   Keep chair behind you for safety and to "aim" your butt for chair.  Keep back straight and don't bend forward.   Repeat _10__ times per set. Do _1__ sets per session.  http://plyo.exer.us/69   Copyright  VHI. All rights reserved.   Hip Abduction (Standing)    Stand with support. Squeeze pelvic floor and hold. Lift right leg out to side, keeping toe forward. Hold for __2-3_ seconds. Relax for _2-3__ seconds.  Don't twist to the side that you are moving, keep foot pointed forward.  DO SLOWLY!!  Repeat _10__ times. Do _2__ times a day.    Copyright  VHI. All rights reserved.   Marching In-Place    Standing straight, alternate bringing knees toward trunk. Arms swing alternately.  Count to 20 (total).  Keep right hand on counter, can put chair or walker on the left for  support.  Work on getting rid of left hand support.  Do _2__ sets. Do _2__ times per day.  Copyright  VHI. All rights reserved.

## 2018-07-19 ENCOUNTER — Ambulatory Visit: Payer: 59 | Admitting: Neurology

## 2018-07-19 ENCOUNTER — Encounter

## 2018-07-20 ENCOUNTER — Ambulatory Visit: Payer: 59 | Admitting: Occupational Therapy

## 2018-07-20 ENCOUNTER — Encounter: Payer: Self-pay | Admitting: Occupational Therapy

## 2018-07-20 DIAGNOSIS — M6281 Muscle weakness (generalized): Secondary | ICD-10-CM | POA: Diagnosis not present

## 2018-07-20 DIAGNOSIS — R2681 Unsteadiness on feet: Secondary | ICD-10-CM

## 2018-07-20 DIAGNOSIS — R278 Other lack of coordination: Secondary | ICD-10-CM

## 2018-07-20 NOTE — Therapy (Signed)
[facility] [address] [place], [place], [address] Phone: [phone]   Fax:  [phone]  Occupational Therapy Treatment  Patient Details  Name: Hannah Jefferson MRN: [id number] Date of Birth: 12-Aug-1956 Referring Provider: Dr Naaman Plummer   Encounter Date: 07/20/2018  OT End of Session - 07/20/18 1651    Visit Number  6    Number of Visits  17    Date for OT Re-Evaluation  09/03/18    Authorization Type  U[employer] 20 visit limit OT    Authorization - Visit Number  6    Authorization - Number of Visits  20    OT Start Time  9381    OT Stop Time  1530    OT Time Calculation (min)  43 min    Activity Tolerance  Patient tolerated treatment well       Past Medical History:  Diagnosis Date  . Chronic left hip pain   . GERD (gastroesophageal reflux disease)   . Gestational diabetes   . Hearing loss of both ears   . Hypertension   . Neuropathy    numbness/tingling from waist down  . Vision abnormalities     Past Surgical History:  Procedure Laterality Date  . IR IVC FILTER PLMT / S&I /IMG GUID/MOD SED  04/05/2018  . LAPAROSCOPIC CHOLECYSTECTOMY  1992  . REFRACTIVE SURGERY Bilateral 2004    There were no vitals filed for this visit.  Subjective Assessment - 07/20/18 1454    Subjective   I do have aquatic therapy coverage as long as the dr thinks it is medically necessary    Patient is accompained by:  --   friend   Currently in Pain?  No/denies                   OT Treatments/Exercises (OP) - 07/20/18 0001      ADLs   ADL Comments  Pt reports that her insurance will cover aquatic therapy after speaking to them - medical necessity agreed to by MD via signed POC.  Reviewed aquatic therapy info sheet with pt and pt verbalized understanding.  Pt to start aquatic therapy on 08/09/2018.       Neurological Re-education Exercises   Other Exercises 1  Neuro re ed to address active sit to stand and stand to sit  without using momentum or UE's first from elevated surface and then from lower surface.  Also addressed LE and lower trunk initiated movement in sit to squat/squat to hover sit progressing to lateral weight shifting in semi squat.  Also addressed reciprocating stabiilization with mobility with alternate stepping on block - pt needs mod cues for fully active trunk into extension as well as facilitation for controlled lateral weight shift, especially weight shift to the L.  Addressed functional ambulation without RW and hands on therapist's shoulder dropping to 1 UE support then no UE support. Pt with significantly pronounced lateral weight shift wtih ambulation as well as L bias.               OT Education - 07/20/18 1647    Education Details  aquatic therapy    Person(s) Educated  Patient    Methods  Explanation;Handout    Comprehension  Verbalized understanding       OT Short Term Goals - 07/20/18 1648      OT SHORT TERM GOAL #1   Title  Patient will complete a home exercise program designed to improve grip strength -  due 08/04/18    Status  On-going      OT SHORT TERM GOAL #2   Title  Patient wil stand in shower from tub transfer bench in shower to wash periarea with supervision    Status  On-going      OT [place] #3   Title  Patient will demonstrate understanding of graduated driving program, and/or driving resources to prepare for return to driving    Status  Achieved      OT [place] #4   Title  Patient will don socks and shoes, and fasten/tie shoes with modified independence    Status  Achieved        OT Long Term Goals - 07/20/18 1649      OT LONG TERM GOAL #1   Title  Patient will demonstrate sufficient activity tolerance and functional mobility to return work 4-6 hours per day following daily self care skills. - 09/03/2018      OT LONG TERM GOAL #2   Title  Patient will prepare a hot meal consisting of at least two items with no more than  intermittent min assist    Status  Achieved      OT LONG TERM GOAL #3   Title  Patient will complete simple housekeeping and laundry from ambulatory level without physical assistance    Status  New      OT LONG TERM GOAL #4   Title  Patient will demonstrate improved dynamic stand balance to stand without UE support for greater than 10 min while completing familiar functional task.      Status  New            Plan - 07/20/18 1650    Clinical Impression Statement  Pt with slow but steady progress toward goals. Pt with improving functional mobility.    Occupational Profile and client history currently impacting functional performance  Patient is married, worked full time as Glass blower/designer, very active in her church with excellent extended family support    Occupational performance deficits (Please refer to evaluation for details):  ADL's;IADL's;Work;Leisure    Rehab Potential  Excellent    OT Frequency  2x / week    OT Duration  8 weeks    OT Treatment/Interventions  Self-care/ADL training;Therapeutic exercise;Aquatic Therapy;Balance training;Therapeutic activities;Functional Mobility Training;DME and/or AE instruction;Energy conservation;Patient/family education;Neuromuscular education;Stair Training    Plan  functional mobility, stand tolerance, decrease UE reliance in standing - dynamic, kitchen or housekeeping task    Consulted and Agree with Plan of Care  Patient       Patient will benefit from skilled therapeutic intervention in order to improve the following deficits and impairments:  Decreased knowledge of use of DME, Pain, Decreased mobility, Impaired sensation, Improper body mechanics, Decreased activity tolerance, Decreased endurance, Decreased strength, Increased muscle spasms, Improper spinal/pelvic alignment, Decreased balance, Difficulty walking  Visit Diagnosis: Muscle weakness (generalized)  Unsteadiness on feet  Other lack of coordination    Problem  List Patient Active Problem List   Diagnosis Date Noted  . Anxiety and depression 05/30/2018  . E. coli UTI 04/27/2018  . Acute blood loss anemia   . Acute deep vein thrombosis (DVT) of right femoral vein ([place])   . Hyponatremia   . Hypokalemia   . Hypomagnesemia   . Ileus, postoperative ([place])   . Thoracic myelopathy 03/21/2018  . Neurogenic bowel 03/21/2018  . Neurogenic bladder 03/21/2018  . Left wrist fracture, sequela 03/21/2018  . Numbness 02/15/2018  .  Ataxic gait 02/15/2018  . Left leg weakness 02/15/2018  . Other fatigue 02/15/2018  . Urinary incontinence 02/15/2018  . Bowel incontinence 02/15/2018    Quay Burow, OTR/L 07/20/2018, 4:54 PM  [facility] [address] [place], [place], [address] Phone: [phone]   Fax:  [phone]  Name: KIERAH GOATLEY MRN: [id number] Date of Birth: November 06, 1956

## 2018-07-25 ENCOUNTER — Encounter: Payer: Self-pay | Admitting: Physical Therapy

## 2018-07-25 ENCOUNTER — Ambulatory Visit: Payer: 59 | Admitting: Occupational Therapy

## 2018-07-25 ENCOUNTER — Ambulatory Visit: Payer: 59 | Admitting: Physical Therapy

## 2018-07-25 DIAGNOSIS — R208 Other disturbances of skin sensation: Secondary | ICD-10-CM

## 2018-07-25 DIAGNOSIS — R2681 Unsteadiness on feet: Secondary | ICD-10-CM

## 2018-07-25 DIAGNOSIS — M6281 Muscle weakness (generalized): Secondary | ICD-10-CM

## 2018-07-25 DIAGNOSIS — R2689 Other abnormalities of gait and mobility: Secondary | ICD-10-CM

## 2018-07-25 NOTE — Therapy (Signed)
[facility] [address] [place], [place], [address] Phone: [phone]   Fax:  [phone]  Occupational Therapy Treatment  Patient Details  Name: Hannah Jefferson MRN: [id number] Date of Birth: 1956-01-28 Referring Provider: Dr Naaman Plummer   Encounter Date: 07/25/2018  OT End of Session - 07/25/18 1309    Visit Number  7    Number of Visits  17    Date for OT Re-Evaluation  09/03/18    Authorization Type  UHC 20 visit limit OT    Authorization - Visit Number  7    Authorization - Number of Visits  20    OT Start Time  1100    OT Stop Time  1145    OT Time Calculation (min)  45 min    Activity Tolerance  Patient tolerated treatment well    Behavior During Therapy  [facility] for tasks assessed/performed       Past Medical History:  Diagnosis Date  . Chronic left hip pain   . GERD (gastroesophageal reflux disease)   . Gestational diabetes   . Hearing loss of both ears   . Hypertension   . Neuropathy    numbness/tingling from waist down  . Vision abnormalities     Past Surgical History:  Procedure Laterality Date  . IR IVC FILTER PLMT / S&I /IMG GUID/MOD SED  04/05/2018  . LAPAROSCOPIC CHOLECYSTECTOMY  1992  . REFRACTIVE SURGERY Bilateral 2004    There were no vitals filed for this visit.  Subjective Assessment - 07/25/18 1108    Subjective   My back and Lt hip are bothering me a little more today. I think I overdid the ex's    Pertinent History  surgery to remove tumor on spine 03/13/18    Limitations  none    Currently in Pain?  Yes    Pain Score  4     Pain Location  Back    Pain Orientation  Mid;Lower    Pain Descriptors / Indicators  Aching    Pain Type  Chronic pain    Pain Frequency  Intermittent    Aggravating Factors   over doing ex's    Pain Relieving Factors  rest, OTC pain meds, ice pack        Treatment:  Sit-stand w/o UE support w/ max effort, close supervision, and use of momentum from  chair.  Dynamic standing and functional mobility for laundry tasks including standing to move clothes from washer to dryer, dryer to laundry basket, and then practiced carrying smaller laundry basket of clothes in Lt hand while holding cane in Rt hand. Also discussed walker safety/walker negotiation in kitchen and fall prevention techniques for getting items out of lower cabinets. Problem solved ways to unload dishwasher and transport clean dishes to cabinets that are not connected by counter top.                      OT Short Term Goals - 07/20/18 1648      OT SHORT TERM GOAL #1   Title  Patient will complete a home exercise program designed to improve grip strength - due 08/04/18    Status  On-going      OT SHORT TERM GOAL #2   Title  Patient wil stand in shower from tub transfer bench in shower to wash periarea with supervision    Status  On-going      OT SHORT TERM GOAL #3  Title  Patient will demonstrate understanding of graduated driving program, and/or driving resources to prepare for return to driving    Status  Achieved      OT [place] #4   Title  Patient will don socks and shoes, and fasten/tie shoes with modified independence    Status  Achieved        OT Long Term Goals - 07/20/18 1649      OT LONG TERM GOAL #1   Title  Patient will demonstrate sufficient activity tolerance and functional mobility to return work 4-6 hours per day following daily self care skills. - 09/03/2018      OT LONG TERM GOAL #2   Title  Patient will prepare a hot meal consisting of at least two items with no more than intermittent min assist    Status  Achieved      OT LONG TERM GOAL #3   Title  Patient will complete simple housekeeping and laundry from ambulatory level without physical assistance    Status  New      OT LONG TERM GOAL #4   Title  Patient will demonstrate improved dynamic stand balance to stand without UE support for greater than 10 min while  completing familiar functional task.      Status  New            Plan - 07/25/18 1309    Clinical Impression Statement  Pt with slow but steady progress toward goals. Pt with improving functional mobility.     Occupational Profile and client history currently impacting functional performance  Patient is married, worked full time as Glass blower/designer, very active in her church with excellent extended family support    Occupational performance deficits (Please refer to evaluation for details):  ADL's;IADL's;Work;Leisure    Rehab Potential  Excellent    OT Frequency  2x / week    OT Duration  8 weeks    OT Treatment/Interventions  Self-care/ADL training;Therapeutic exercise;Aquatic Therapy;Balance training;Therapeutic activities;Functional Mobility Training;DME and/or AE instruction;Energy conservation;Patient/family education;Neuromuscular education;Stair Training    Plan  functional mobility, stand tolerance, decrease UE reliance in standing    Consulted and Agree with Plan of Care  Patient;Family member/caregiver       Patient will benefit from skilled therapeutic intervention in order to improve the following deficits and impairments:  Decreased knowledge of use of DME, Pain, Decreased mobility, Impaired sensation, Improper body mechanics, Decreased activity tolerance, Decreased endurance, Decreased strength, Increased muscle spasms, Improper spinal/pelvic alignment, Decreased balance, Difficulty walking  Visit Diagnosis: Muscle weakness (generalized)  Unsteadiness on feet    Problem List Patient Active Problem List   Diagnosis Date Noted  . Anxiety and depression 05/30/2018  . E. coli UTI 04/27/2018  . Acute blood loss anemia   . Acute deep vein thrombosis (DVT) of right femoral vein ([place])   . Hyponatremia   . Hypokalemia   . Hypomagnesemia   . Ileus, postoperative ([place])   . Thoracic myelopathy 03/21/2018  . Neurogenic bowel 03/21/2018  . Neurogenic bladder 03/21/2018  .  Left wrist fracture, sequela 03/21/2018  . Numbness 02/15/2018  . Ataxic gait 02/15/2018  . Left leg weakness 02/15/2018  . Other fatigue 02/15/2018  . Urinary incontinence 02/15/2018  . Bowel incontinence 02/15/2018    Carey Bullocks, OTR/L 07/25/2018, 1:10 PM  [facility] [address] [place], [place], [address] Phone: [phone]   Fax:  [phone]  Name: CLARIBEL SACHS MRN: [id number] Date  of Birth: 1956/06/20

## 2018-07-25 NOTE — Therapy (Signed)
[facility] [address] [place], [place], [address] Phone: [phone]   Fax:  [phone]  Physical Therapy Treatment  Patient Details  Name: Hannah Jefferson MRN: [id number] Date of Birth: Feb 28, 1956 Referring Provider: Dr Naaman Plummer   Encounter Date: 07/25/2018  PT End of Session - 07/25/18 1149    Visit Number  6    Number of Visits  17    Date for PT Re-Evaluation  09/03/18    Authorization Type  UHC: 20 visits PT and 20 visits OT    Authorization - Visit Number  6    Authorization - Number of Visits  20    PT Start Time  1146    PT Stop Time  1228    PT Time Calculation (min)  42 min    Activity Tolerance  Patient tolerated treatment well    Behavior During Therapy  [facility] for tasks assessed/performed       Past Medical History:  Diagnosis Date  . Chronic left hip pain   . GERD (gastroesophageal reflux disease)   . Gestational diabetes   . Hearing loss of both ears   . Hypertension   . Neuropathy    numbness/tingling from waist down  . Vision abnormalities     Past Surgical History:  Procedure Laterality Date  . IR IVC FILTER PLMT / S&I /IMG GUID/MOD SED  04/05/2018  . LAPAROSCOPIC CHOLECYSTECTOMY  1992  . REFRACTIVE SURGERY Bilateral 2004    There were no vitals filed for this visit.  Subjective Assessment - 07/25/18 1148    Subjective  Reports good compliance with HEP - having a some back pain today; was able to take a trip to [place]; able to descend 14 steps     Patient is accompained by:  Family member    Pertinent History  B DVTs s/p 4/20019 surgery with IVC filter in place, hx of gestation DM only, HTN, Chronic L hip pain, scoliosis, hiatal hernia    Patient Stated Goals  I want to get back as much as possible.     Currently in Pain?  Yes    Pain Score  2    tylenol prior to session   Pain Location  Back    Pain Orientation  Lower;Left    Pain Descriptors / Indicators  Aching;Sore    Pain  Type  Chronic pain                       [facility] Adult PT Treatment/Exercise - 07/25/18 1157      Ambulation/Gait   Ambulation/Gait  Yes    Ambulation/Gait Assistance  4: Min guard    Ambulation/Gait Assistance Details  working with quad tip cane with addition of horiztonal nad vertical head turns as well as pivot and stop; min guard througout for safety as well as full cervical/eye motion as patient prefers to stop in midline while changing head/eye position    Ambulation Distance (Feet)  300 Feet    Assistive device  Straight cane   quad tip   Gait Pattern  Step-through pattern;Decreased stride length;Decreased dorsiflexion - right;Decreased dorsiflexion - left    Ambulation Surface  Level;Indoor      Therapeutic Activites    Therapeutic Activities  Other Therapeutic Activities    Other Therapeutic Activities  Breakdown of floor to stand transfers: heel sitting to tall kneeling x 12 reps - limited knee motion; tall kneel to L 1/2 kneel x 12 reps -  Min A for R hip flexion for foot clearance; -1/2 kneel to stand - Min/Mod A with patient facing mat table - attempted single UE push up with patient unable      Neuro Re-ed    Neuro Re-ed Details   Backwards gait in // bars x 4 reps - VC for forward gaze; high knee march in // bars x 3 reps - VC for forward gaze and appropriate breathing; side stepping in // bars x 2 each direction; alternating toe tap to 6" step - single UE support with 2 instances of poor foot clearance from L LE; sit to stand x 5 without UE support - much more efficient if not pushing up from knees               PT Short Term Goals - 07/06/18 1505      PT SHORT TERM GOAL #1   Title  Pt will be IND in HEP to improve deficits listed above. TARGET DATE FOR ALL STGS: 08/02/18    Status  New      PT SHORT TERM GOAL #2   Title  Pt will improve TUG time with LRAD to </=13.5sec. to decr. falls risk.     Status  New      PT SHORT TERM GOAL #3   Title  Pt  will improve gait speed to >/=2.49ft/sec, with LRAD to safely amb. in the community.     Status  New      PT SHORT TERM GOAL #4   Title  Pt will amb. 500' over even terrain, with LRAD at MOD I Level to improve safety during functional mobility.     Status  New      PT SHORT TERM GOAL #5   Title  Perform BERG and write STG and LTG    Baseline  30/56    Status  Achieved      Additional Short Term Goals   Additional Short Term Goals  Yes      PT SHORT TERM GOAL #6   Title  Pt will improve BERG balance test to 34/56 in order to indicate decreased fall risk.     Time  4    Period  Weeks    Status  New        PT Long Term Goals - 07/14/18 1537      PT LONG TERM GOAL #1   Title  Pt will amb. 1000' over even/paved terrain with LRAD at MOD I level to improve functional mobility. TARGET DATE FOR ALL LTGS: 08/30/18    Status  New      PT LONG TERM GOAL #2   Title  Trial amb. without AD when appropriate and write goal as indicated.     Status  New      PT LONG TERM GOAL #3   Title  Pt will report no falls in the last 4 weeks to improve safety.     Status  New      PT LONG TERM GOAL #4   Title  Pt will improve BERG balance score to 38/56 in order to indicate decreased fall risk.     Time  8    Period  Weeks    Status  New      PT LONG TERM GOAL #5   Title  Pt will perform floor recovery tranfser at S level in order to indicate safety in case of fall but also to ensure safety when playing in  floor with family.     Time  8    Period  Weeks    Status  New            Plan - 07/25/18 1238    Clinical Impression Statement  patient doing well today - able to progress gait trianing with quad tip cane to include horizontal and vertical head turns as well as pivot and stop wihtout LOB - did require min guard for safety but no physical assist for steadying. Breakdown of floor to stand transfer for mastery of each component - most difficulty with tall knee to 1/2 kneel as well as 1/2  kneel to stand requiring Min/Mod A, likely due to fatigue. Making good progress towards all goals. WIll continue to benefit from skilled PT.     Rehab Potential  Good    Clinical Impairments Affecting Rehab Potential  see above.     PT Frequency  2x / week    PT Duration  8 weeks    PT Treatment/Interventions  ADLs/Self Care Home Management;Biofeedback;Electrical Stimulation;Therapeutic exercise;Therapeutic activities;Functional mobility training;Stair training;Orthotic Fit/Training;Gait training;Patient/family education;DME Instruction;Neuromuscular re-education;Balance training;Vestibular    PT Next Visit Plan  Continue floor transfer, transitional movements, Add to HEP for balance (wall bumps would be great for her-also give combined handout made at last session), gait with quad tip cane, continue sit<>stand with emphasis on forward weight shift (may have to begin in elevated position), standing balance with emphasis on forward weight shift (lateral/foward), turns and negotiation around obstacles with cane    Consulted and Agree with Plan of Care  Patient;Family member/caregiver    Family Member Consulted  sister in law       Patient will benefit from skilled therapeutic intervention in order to improve the following deficits and impairments:  Abnormal gait, Decreased endurance, Impaired sensation, Decreased knowledge of use of DME, Decreased strength, Decreased balance, Decreased mobility, Decreased range of motion, Decreased coordination  Visit Diagnosis: Muscle weakness (generalized)  Unsteadiness on feet  Other disturbances of skin sensation  Other abnormalities of gait and mobility     Problem List Patient Active Problem List   Diagnosis Date Noted  . Anxiety and depression 05/30/2018  . E. coli UTI 04/27/2018  . Acute blood loss anemia   . Acute deep vein thrombosis (DVT) of right femoral vein ([place])   . Hyponatremia   . Hypokalemia   . Hypomagnesemia   . Ileus,  postoperative ([place])   . Thoracic myelopathy 03/21/2018  . Neurogenic bowel 03/21/2018  . Neurogenic bladder 03/21/2018  . Left wrist fracture, sequela 03/21/2018  . Numbness 02/15/2018  . Ataxic gait 02/15/2018  . Left leg weakness 02/15/2018  . Other fatigue 02/15/2018  . Urinary incontinence 02/15/2018  . Bowel incontinence 02/15/2018     Lanney Gins, PT, DPT 07/25/18 12:43 PM Pager: [phone]   [facility] [address] [place], [place], [address] Phone: [phone]   Fax:  [phone]  Name: Hannah Jefferson MRN: [id number] Date of Birth: 1955/12/26

## 2018-07-27 ENCOUNTER — Encounter: Payer: Self-pay | Admitting: Occupational Therapy

## 2018-07-27 ENCOUNTER — Ambulatory Visit: Payer: 59 | Admitting: Occupational Therapy

## 2018-07-27 ENCOUNTER — Encounter: Payer: Self-pay | Admitting: Rehabilitation

## 2018-07-27 ENCOUNTER — Ambulatory Visit: Payer: 59 | Admitting: Rehabilitation

## 2018-07-27 DIAGNOSIS — R208 Other disturbances of skin sensation: Secondary | ICD-10-CM

## 2018-07-27 DIAGNOSIS — M6281 Muscle weakness (generalized): Secondary | ICD-10-CM | POA: Diagnosis not present

## 2018-07-27 DIAGNOSIS — R2689 Other abnormalities of gait and mobility: Secondary | ICD-10-CM

## 2018-07-27 DIAGNOSIS — R2681 Unsteadiness on feet: Secondary | ICD-10-CM

## 2018-07-27 DIAGNOSIS — R278 Other lack of coordination: Secondary | ICD-10-CM
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
## 2018-07-27 NOTE — Patient Instructions (Signed)
Feet Together (Compliant Surface) Arm Motion - Eyes Closed    Stand on compliant surface: ___pillow_____ with feet together. Close eyes and keep arms by your side.  Repeat __3__ times per session for 15 secs. Do _1-2___ sessions per day.  Copyright  VHI. All rights reserved.

## 2018-07-27 NOTE — Therapy (Signed)
[facility] [address] [place], [place], [address] Phone: [phone]   Fax:  [phone]  Physical Therapy Treatment  Patient Details  Name: Hannah Jefferson MRN: [id number] Date of Birth: 1956/02/12 Referring Provider: Dr Naaman Plummer   Encounter Date: 07/27/2018  PT End of Session - 07/27/18 1529    Visit Number  7    Number of Visits  17    Date for PT Re-Evaluation  09/03/18    Authorization Type  UHC: 20 visits PT and 20 visits OT    Authorization - Visit Number  7    Authorization - Number of Visits  20    PT Start Time  6314    PT Stop Time  1450    PT Time Calculation (min)  47 min    Activity Tolerance  Patient tolerated treatment well    Behavior During Therapy  [facility] for tasks assessed/performed       Past Medical History:  Diagnosis Date  . Chronic left hip pain   . GERD (gastroesophageal reflux disease)   . Gestational diabetes   . Hearing loss of both ears   . Hypertension   . Neuropathy    numbness/tingling from waist down  . Vision abnormalities     Past Surgical History:  Procedure Laterality Date  . IR IVC FILTER PLMT / S&I /IMG GUID/MOD SED  04/05/2018  . LAPAROSCOPIC CHOLECYSTECTOMY  1992  . REFRACTIVE SURGERY Bilateral 2004    There were no vitals filed for this visit.  Subjective Assessment - 07/27/18 1407    Subjective  Pt reports increased back pain yesterday and today from working increased hours.  Also notes L hip pain, esp with increased standing/walking.     Patient is accompained by:  Family member    Pertinent History  B DVTs s/p 4/20019 surgery with IVC filter in place, hx of gestation DM only, HTN, Chronic L hip pain, scoliosis, hiatal hernia    Patient Stated Goals  I want to get back as much as possible.     Currently in Pain?  Yes    Pain Score  3     Pain Location  Back    Pain Orientation  Lower    Pain Descriptors / Indicators  Aching;Sore    Pain Type  Chronic pain     Pain Onset  Yesterday    Pain Frequency  Intermittent    Aggravating Factors   working     Pain Relieving Factors  tylenol, ice pack                        [facility] Adult PT Treatment/Exercise - 07/27/18 1405      Ambulation/Gait   Ambulation/Gait  Yes    Ambulation/Gait Assistance  4: Min guard    Ambulation/Gait Assistance Details  Pt presents to clinic today with new cane with rubber tip in shape of circle (similar to quad tip cane but slightly smaller base).  Reports that it belongs to sister in law and has been working with this cane a little at home, but would like to get quad tip cane also.  Pt continues to need min cues for relaxed posture and decreased LE external rotation.  She also demonstrates mild unsteadiness esp when making turns or sudden stops.      Ambulation Distance (Feet)  150 Feet    Assistive device  Straight cane   with circle tip   Gait  Pattern  Step-through pattern;Decreased stride length;Decreased dorsiflexion - right;Decreased dorsiflexion - left    Ambulation Surface  Level;Indoor    Ramp  5: Supervision   min/guard   Ramp Details (indicate cue type and reason)  Min cues for posture and keeping weight towards heels when descending ramp.     Curb  4: Min assist    Curb Details (indicate cue type and reason)  Cues and demo for sequencing and technique with cane (including stepping through both when ascending/descending).  Note that she has difficulty both with ascending/descending, but due to LE weakness has lack of eccentric control with descent.        Neuro Re-ed    Neuro Re-ed Details   Continue to work on improving balance strategies during session along with multi-sensory challenges; in // bars performing braiding x 4 reps with light BUE>single UE support with cues for improved weight shift and posture.  Wall bumps with feet approx 4-5" from wall x 5 reps.  Increased challenge by adding foam airex and performed wall bumps with feet apart x 5  reps>feet together x 5 reps all with 5 sec hold to emphasize pt getting into improved posture and core activation.  Pt reports pain in L ankle (due to past injury) and note that at times she tends to supinate ankle.  Therefore transitioned to pt standing on 2 stacked pillows with feet apart maintaining balance but also emphasizing attention to L ankle to maintain in neutral alignment.  Progressed to feet together x 2 sets of 20 secs with moderate postural sway therefore added to HEP.        Exercises   Exercises  Other Exercises    Other Exercises   Pt reports increased pain in lateral aspect of L hip which seemed to be related to IT band.  Attempted position in standing to stretch, however this was difficult for pt, therefore had her lie down and cross L ankle over R knee and pull L knee towards R shoulder.  Pt reports that this gets area stretched very well, therefore verbally added to HEP.          TA:  Briefly assessed for R and L BPPV during session as she reports she intermittently has dizziness/spinning when lying back into bed at night.  Both R and L Dix Hallpike negative during session with some reports of mild dizziness when returning to sitting.  Educated pt on motion sensitivity and vestibular hypofunction due to lack of mobility in the past few months.       PT Education - 07/27/18 1529    Education Details  balance exercise to HEP    Person(s) Educated  Patient    Methods  Explanation;Demonstration;Handout    Comprehension  Verbalized understanding;Returned demonstration       PT Short Term Goals - 07/06/18 1505      PT SHORT TERM GOAL #1   Title  Pt will be IND in HEP to improve deficits listed above. TARGET DATE FOR ALL STGS: 08/02/18    Status  New      PT SHORT TERM GOAL #2   Title  Pt will improve TUG time with LRAD to </=13.5sec. to decr. falls risk.     Status  New      PT SHORT TERM GOAL #3   Title  Pt will improve gait speed to >/=2.43ft/sec, with LRAD to safely  amb. in the community.     Status  New  PT SHORT TERM GOAL #4   Title  Pt will amb. 500' over even terrain, with LRAD at MOD I Level to improve safety during functional mobility.     Status  New      PT SHORT TERM GOAL #5   Title  Perform BERG and write STG and LTG    Baseline  30/56    Status  Achieved      Additional Short Term Goals   Additional Short Term Goals  Yes      PT SHORT TERM GOAL #6   Title  Pt will improve BERG balance test to 34/56 in order to indicate decreased fall risk.     Time  4    Period  Weeks    Status  New        PT Long Term Goals - 07/14/18 1537      PT LONG TERM GOAL #1   Title  Pt will amb. 1000' over even/paved terrain with LRAD at MOD I level to improve functional mobility. TARGET DATE FOR ALL LTGS: 08/30/18    Status  New      PT LONG TERM GOAL #2   Title  Trial amb. without AD when appropriate and write goal as indicated.     Status  New      PT LONG TERM GOAL #3   Title  Pt will report no falls in the last 4 weeks to improve safety.     Status  New      PT LONG TERM GOAL #4   Title  Pt will improve BERG balance score to 38/56 in order to indicate decreased fall risk.     Time  8    Period  Weeks    Status  New      PT LONG TERM GOAL #5   Title  Pt will perform floor recovery tranfser at S level in order to indicate safety in case of fall but also to ensure safety when playing in floor with family.     Time  8    Period  Weeks    Status  New            Plan - 07/27/18 1530    Clinical Impression Statement  Pt with increased low back and lateral L LE pain today which was improved with L IT band stretch.  Pt continues to do well with use of cane, however continue to note decreased balance esp when needing vestibular input.  Pt reports that she does get dizzy at times when lying back in bed and that it is a spinning sensation.  Briefly assessed for BPPV, however R and L Dix hallpike were negative, therefore may just be  motion sensitivity and/or vestibular hypofunction.     Rehab Potential  Good    Clinical Impairments Affecting Rehab Potential  see above.     PT Frequency  2x / week    PT Duration  8 weeks    PT Treatment/Interventions  ADLs/Self Care Home Management;Biofeedback;Electrical Stimulation;Therapeutic exercise;Therapeutic activities;Functional mobility training;Stair training;Orthotic Fit/Training;Gait training;Patient/family education;DME Instruction;Neuromuscular re-education;Balance training;Vestibular    PT Next Visit Plan  Continue floor transfer, transitional movements, Add to HEP for balance, gait with quad tip cane, standing balance with emphasis on forward weight shift (lateral/foward), turns and negotiation around obstacles with cane    Consulted and Agree with Plan of Care  Patient;Family member/caregiver    Family Member Consulted  sister in law  Patient will benefit from skilled therapeutic intervention in order to improve the following deficits and impairments:  Abnormal gait, Decreased endurance, Impaired sensation, Decreased knowledge of use of DME, Decreased strength, Decreased balance, Decreased mobility, Decreased range of motion, Decreased coordination  Visit Diagnosis: Muscle weakness (generalized)  Unsteadiness on feet  Other abnormalities of gait and mobility  Other disturbances of skin sensation     Problem List Patient Active Problem List   Diagnosis Date Noted  . Anxiety and depression 05/30/2018  . E. coli UTI 04/27/2018  . Acute blood loss anemia   . Acute deep vein thrombosis (DVT) of right femoral vein ([place])   . Hyponatremia   . Hypokalemia   . Hypomagnesemia   . Ileus, postoperative ([place])   . Thoracic myelopathy 03/21/2018  . Neurogenic bowel 03/21/2018  . Neurogenic bladder 03/21/2018  . Left wrist fracture, sequela 03/21/2018  . Numbness 02/15/2018  . Ataxic gait 02/15/2018  . Left leg weakness 02/15/2018  . Other fatigue 02/15/2018  .  Urinary incontinence 02/15/2018  . Bowel incontinence 02/15/2018    Cameron Sprang, PT, MPT [facility] [address] [place], [place], [address] Phone: [phone]   Fax:  [phone] 07/27/18, 3:34 PM  Name: ANELISSE JACOBSON MRN: [id number] Date of Birth: Jan 06, 1956

## 2018-07-31 ENCOUNTER — Encounter: Payer: Self-pay | Admitting: Rehabilitation

## 2018-07-31 ENCOUNTER — Ambulatory Visit: Payer: 59 | Admitting: Rehabilitation

## 2018-07-31 ENCOUNTER — Ambulatory Visit: Payer: 59 | Admitting: Occupational Therapy

## 2018-07-31 DIAGNOSIS — M6281 Muscle weakness (generalized): Secondary | ICD-10-CM | POA: Diagnosis not present

## 2018-07-31 DIAGNOSIS — R2689 Other abnormalities of gait and mobility: Secondary | ICD-10-CM

## 2018-07-31 DIAGNOSIS — R2681 Unsteadiness on feet: Secondary | ICD-10-CM

## 2018-07-31 DIAGNOSIS — R208 Other disturbances of skin sensation: Secondary | ICD-10-CM

## 2018-07-31 NOTE — Patient Instructions (Signed)
Outer Hip Stretch: Reclined IT Band Stretch (Strap)    Strap around opposite foot or have husband/family help, pull across only as far as possible with shoulders on mat.  Keep knee straight.   Hold for __30-60__ secs. Repeat __3__ times each leg.  Copyright  VHI. All rights reserved.   OR     SIDELYING - STRETCH - ILIOTIBIAL BAND - ITB  Start by lying on your right side with your back near the edge of your bed or table. Your left leg should be on top.  Next, let the top leg lower behind you as you maintain an extended knee as shown. You should feel a gentle stretch along the side of your leg.  Hold for 30-60 secs.  Do 3 times per session, do 2-3 times per day.    Use rolling but wrap/tape wash cloth around before using OR use frozen water bottle (have against your bare skin as much as possible-keep underwear on).  Roll along upper outer portion of your left leg (like rolling dough) for about 5 mins.  Do 1-2 a day, esp at the end of the day.

## 2018-07-31 NOTE — Therapy (Signed)
[facility] [address] [place], [place], [address] Phone: [phone]   Fax:  [phone]  Physical Therapy Treatment  Patient Details  Name: Hannah Jefferson MRN: [id number] Date of Birth: December 16, 1955 Referring Provider: Dr Naaman Plummer   Encounter Date: 07/31/2018  PT End of Session - 07/31/18 1255    Visit Number  8    Number of Visits  17    Date for PT Re-Evaluation  09/03/18    Authorization Type  U[employer]: 20 visits PT and 20 visits OT    Authorization - Visit Number  8    Authorization - Number of Visits  20    PT Start Time  4235    PT Stop Time  1230    PT Time Calculation (min)  45 min    Activity Tolerance  Patient tolerated treatment well    Behavior During Therapy  [facility] for tasks assessed/performed       Past Medical History:  Diagnosis Date  . Chronic left hip pain   . GERD (gastroesophageal reflux disease)   . Gestational diabetes   . Hearing loss of both ears   . Hypertension   . Neuropathy    numbness/tingling from waist down  . Vision abnormalities     Past Surgical History:  Procedure Laterality Date  . IR IVC FILTER PLMT / S&I /IMG GUID/MOD SED  04/05/2018  . LAPAROSCOPIC CHOLECYSTECTOMY  1992  . REFRACTIVE SURGERY Bilateral 2004    There were no vitals filed for this visit.  Subjective Assessment - 07/31/18 1149    Subjective  Pt reports continued pain in L hip and into L side of abdomen today.     Patient is accompained by:  Family member    Pertinent History  B DVTs s/p 4/20019 surgery with IVC filter in place, hx of gestation DM only, HTN, Chronic L hip pain, scoliosis, hiatal hernia    Patient Stated Goals  I want to get back as much as possible.     Currently in Pain?  Yes    Pain Score  4     Pain Location  Hip    Pain Orientation  Left    Pain Descriptors / Indicators  Aching    Pain Type  Chronic pain    Pain Onset  More than a month ago    Pain Frequency  Intermittent    Aggravating Factors   walking more    Pain Relieving Factors  rest, tylenol and ice pack                        [facility] Adult PT Treatment/Exercise - 07/31/18 1145      Ambulation/Gait   Ambulation/Gait  Yes    Ambulation/Gait Assistance  4: Min assist;4: Min guard    Ambulation/Gait Assistance Details  Pt ambulatory into clinic with RW today due to increased L hip pain.  Following stretching, pt willing to ambulate with cane.  Pt ambulated 115' with quad tip cane during session at min/guard level.  Pt needs cues for relaxed shoulder posture and forward gaze.  Note that she does not fully load RLE during stance causing some hip shift to the L and feel that this may be causing L hip bursitis type pain.  Went to counter top to break task down for improved carryover. see pre gait sections.     Ambulation Distance (Feet)  115 Feet   then another 115 without device  Assistive device  Straight cane   with quad tip   Gait Pattern  Step-through pattern;Decreased stride length;Decreased dorsiflexion - right;Decreased dorsiflexion - left    Ambulation Surface  Level;Indoor    Pre-Gait Activities  At counter top with single UE support (some intermittent BUE support), had pt work on advancing RLE then shift anteriorly onto RLE for improved hip extension.  Note that she initially was very resistant to this stating she felt like she was going to fall.  With BUE support she was able to improve this and eventually moved back to single UE support.  Performed along counter top on BLEs with weight shift only x 10 reps following by slow foward gait.  Pt able to return demo with improved glute activation, therefore had her ambulate with B HHA from PT (facing each other) x 115' with cues for relaxed shoulders/posture and forward movement at hips along with decreased LLE external rotation.  Pt did very well during session.        Exercises   Exercises  Other Exercises    Other Exercises   supine  piriformis stretch as she is doing at home x 30 secs on LLE, supine L IT band stretch with PT providing assist (demo'd how to do with band but she prefers husband to assist)-added to HEP, also showed her how to do R SL IT band stretch and provided on HEP so that she could choose which she preferred.       Manual Therapy   Manual Therapy  Soft tissue mobilization    Manual therapy comments  L IT band    Soft tissue mobilization  L IT band/TFL soft tissue mobilization with foam roll x 5 mins during session.  educated on how to do with frozen water bottle at home to further address inflammation.              PT Education - 07/31/18 1255    Education Details  IT band stretches, soft tissue mobs to HEP    Person(s) Educated  Patient    Methods  Explanation;Demonstration;Handout    Comprehension  Verbalized understanding;Returned demonstration       PT Short Term Goals - 07/06/18 1505      PT SHORT TERM GOAL #1   Title  Pt will be IND in HEP to improve deficits listed above. TARGET DATE FOR ALL STGS: 08/02/18    Status  New      PT SHORT TERM GOAL #2   Title  Pt will improve TUG time with LRAD to </=13.5sec. to decr. falls risk.     Status  New      PT SHORT TERM GOAL #3   Title  Pt will improve gait speed to >/=2.41ft/sec, with LRAD to safely amb. in the community.     Status  New      PT SHORT TERM GOAL #4   Title  Pt will amb. 500' over even terrain, with LRAD at MOD I Level to improve safety during functional mobility.     Status  New      PT SHORT TERM GOAL #5   Title  Perform BERG and write STG and LTG    Baseline  30/56    Status  Achieved      Additional Short Term Goals   Additional Short Term Goals  Yes      PT SHORT TERM GOAL #6   Title  Pt will improve BERG balance test to 34/56 in order to  indicate decreased fall risk.     Time  4    Period  Weeks    Status  New        PT Long Term Goals - 07/14/18 1537      PT LONG TERM GOAL #1   Title  Pt will amb.  1000' over even/paved terrain with LRAD at MOD I level to improve functional mobility. TARGET DATE FOR ALL LTGS: 08/30/18    Status  New      PT LONG TERM GOAL #2   Title  Trial amb. without AD when appropriate and write goal as indicated.     Status  New      PT LONG TERM GOAL #3   Title  Pt will report no falls in the last 4 weeks to improve safety.     Status  New      PT LONG TERM GOAL #4   Title  Pt will improve BERG balance score to 38/56 in order to indicate decreased fall risk.     Time  8    Period  Weeks    Status  New      PT LONG TERM GOAL #5   Title  Pt will perform floor recovery tranfser at S level in order to indicate safety in case of fall but also to ensure safety when playing in floor with family.     Time  8    Period  Weeks    Status  New            Plan - 07/31/18 1255    Clinical Impression Statement  Pt continued to have R IT band pain.  Upon further assessment pts IT band is very tight and seems to have a bursitis component in L hip.  Pt responded well to IT band/TFL foam roll mobilization along with IT band stretches.  Provided handout on these exercises/stretches and also emphasized improved glute med/max activation during stance phase of gait as PT feels that her Trendelenburg like gait pattern is casuing inflammation and pain.  Pt verbalized understanding.     Rehab Potential  Good    Clinical Impairments Affecting Rehab Potential  see above.     PT Frequency  2x / week    PT Duration  8 weeks    PT Treatment/Interventions  ADLs/Self Care Home Management;Biofeedback;Electrical Stimulation;Therapeutic exercise;Therapeutic activities;Functional mobility training;Stair training;Orthotic Fit/Training;Gait training;Patient/family education;DME Instruction;Neuromuscular re-education;Balance training;Vestibular    PT Next Visit Plan  Continue floor transfer, transitional movements, Add to HEP for balance, gait with quad tip cane, standing balance with  emphasis on forward weight shift (lateral/foward), turns and negotiation around obstacles with cane    Consulted and Agree with Plan of Care  Patient;Family member/caregiver    Family Member Consulted  sister in law       Patient will benefit from skilled therapeutic intervention in order to improve the following deficits and impairments:  Abnormal gait, Decreased endurance, Impaired sensation, Decreased knowledge of use of DME, Decreased strength, Decreased balance, Decreased mobility, Decreased range of motion, Decreased coordination  Visit Diagnosis: Muscle weakness (generalized)  Unsteadiness on feet  Other disturbances of skin sensation  Other abnormalities of gait and mobility     Problem List Patient Active Problem List   Diagnosis Date Noted  . Anxiety and depression 05/30/2018  . E. coli UTI 04/27/2018  . Acute blood loss anemia   . Acute deep vein thrombosis (DVT) of right femoral vein ([place])   . Hyponatremia   .  Hypokalemia   . Hypomagnesemia   . Ileus, postoperative ([place])   . Thoracic myelopathy 03/21/2018  . Neurogenic bowel 03/21/2018  . Neurogenic bladder 03/21/2018  . Left wrist fracture, sequela 03/21/2018  . Numbness 02/15/2018  . Ataxic gait 02/15/2018  . Left leg weakness 02/15/2018  . Other fatigue 02/15/2018  . Urinary incontinence 02/15/2018  . Bowel incontinence 02/15/2018    Cameron Sprang, PT, MPT [facility] [address] [place], [place], [address] Phone: [phone]   Fax:  [phone] 07/31/18, 1:02 PM  Name: Hannah Jefferson MRN: [id number] Date of Birth: [birth year]

## 2018-07-31 NOTE — Therapy (Signed)
[facility] [address] [place], [place], [address] Phone: [phone]   Fax:  [phone]  Occupational Therapy Treatment  Patient Details  Name: Hannah Jefferson MRN: [id number] Date of Birth: 1955-11-18 Referring Provider: Dr Naaman Plummer   Encounter Date: 07/31/2018  OT End of Session - 07/31/18 1327    Visit Number  9    Number of Visits  17    Date for OT Re-Evaluation  09/03/18    Authorization Type  U[employer] 20 visit limit OT    Authorization - Visit Number  9    Authorization - Number of Visits  20    OT Start Time  3532    OT Stop Time  1320    OT Time Calculation (min)  50 min    Activity Tolerance  Patient tolerated treatment well    Behavior During Therapy  [facility] for tasks assessed/performed       Past Medical History:  Diagnosis Date  . Chronic left hip pain   . GERD (gastroesophageal reflux disease)   . Gestational diabetes   . Hearing loss of both ears   . Hypertension   . Neuropathy    numbness/tingling from waist down  . Vision abnormalities     Past Surgical History:  Procedure Laterality Date  . IR IVC FILTER PLMT / S&I /IMG GUID/MOD SED  04/05/2018  . LAPAROSCOPIC CHOLECYSTECTOMY  1992  . REFRACTIVE SURGERY Bilateral 2004    There were no vitals filed for this visit.  Subjective Assessment - 07/31/18 1325    Subjective   This is hard (re: tall kneeling)    Pertinent History  surgery to remove tumor on spine 03/13/18    Limitations  none    Currently in Pain?  Yes    Pain Score  4     Pain Location  Hip    Pain Orientation  Left    Pain Descriptors / Indicators  Aching    Pain Type  Chronic pain    Pain Onset  More than a month ago    Pain Frequency  Intermittent    Aggravating Factors   Walking more    Pain Relieving Factors  rest, tylenol, ice pack       Discussed where to put grab bars in shower.  Worked on sit to stand and slowly controlled descent w/ decreased reliance on UE's x  5 reps. Tall kneeling to work on pelvic/core control with physioball for UE support when transitioning from short to tall kneeling and cues to decrease pressure applied to ball w/ UE's. Then worked on maintaining tall kneeling w/ small movements in trunk rotation w/ max difficulty and compensations into shoulder hiking. Quadraped: leg lifts alternating with hold of 3 sec. and max difficulty (unable to lift opposite arm), however reported back felt better after this ex.  Lateral trunk flexion bilaterally on tilted board seated w/ min difficulty. Seated on disc on high mat (LE's disengaged): worked on A-P pelvic tilts, however pt would compensate into whole body tilting vs. counter balancing with upper trunk flex/ext.                       OT Short Term Goals - 07/27/18 1502      OT SHORT TERM GOAL #1   Title  Patient will complete a home exercise program designed to improve grip strength - due 08/04/18    Status  Achieved      OT  SHORT TERM GOAL #2   Title  Patient wil stand in shower from tub transfer bench in shower to wash periarea with supervision    Status  On-going      OT SHORT TERM GOAL #3   Title  Patient will demonstrate understanding of graduated driving program, and/or driving resources to prepare for return to driving    Status  Achieved      OT [place] #4   Title  Patient will don socks and shoes, and fasten/tie shoes with modified independence    Status  Achieved        OT Long Term Goals - 07/27/18 1506      OT LONG TERM GOAL #2   Title  Patient will prepare a hot meal consisting of at least two items with no more than intermittent min assist  (Pended)     Status  Achieved  (Pended)       OT LONG TERM GOAL #3   Title  Patient will complete simple housekeeping and laundry from ambulatory level without physical assistance  (Pended)     Status  Achieved  (Pended)             Plan - 07/31/18 1327    Clinical Impression Statement  Pt with  slow but steady progress toward goals. Pt with improving functional mobility. Pt w/ decreased pelvic/core control     Occupational Profile and client history currently impacting functional performance  Patient is married, worked full time as Glass blower/designer, very active in her church with excellent extended family support    Occupational performance deficits (Please refer to evaluation for details):  ADL's;IADL's;Work;Leisure    Rehab Potential  Excellent    OT Frequency  2x / week    OT Duration  8 weeks    OT Treatment/Interventions  Self-care/ADL training;Therapeutic exercise;Aquatic Therapy;Balance training;Therapeutic activities;Functional Mobility Training;DME and/or AE instruction;Energy conservation;Patient/family education;Neuromuscular education;Stair Training    Plan  functional mobility, stand tolerance, decrease UE reliance in standing       Patient will benefit from skilled therapeutic intervention in order to improve the following deficits and impairments:  Decreased knowledge of use of DME, Pain, Decreased mobility, Impaired sensation, Improper body mechanics, Decreased activity tolerance, Decreased endurance, Decreased strength, Increased muscle spasms, Improper spinal/pelvic alignment, Decreased balance, Difficulty walking  Visit Diagnosis: Unsteadiness on feet  Muscle weakness (generalized)  Other disturbances of skin sensation  Other abnormalities of gait and mobility    Problem List Patient Active Problem List   Diagnosis Date Noted  . Anxiety and depression 05/30/2018  . E. coli UTI 04/27/2018  . Acute blood loss anemia   . Acute deep vein thrombosis (DVT) of right femoral vein ([place])   . Hyponatremia   . Hypokalemia   . Hypomagnesemia   . Ileus, postoperative ([place])   . Thoracic myelopathy 03/21/2018  . Neurogenic bowel 03/21/2018  . Neurogenic bladder 03/21/2018  . Left wrist fracture, sequela 03/21/2018  . Numbness 02/15/2018  . Ataxic gait 02/15/2018  .  Left leg weakness 02/15/2018  . Other fatigue 02/15/2018  . Urinary incontinence 02/15/2018  . Bowel incontinence 02/15/2018    Carey Bullocks, OTR/L 07/31/2018, 1:28 PM  [facility] [address] [place], [place], [address] Phone: [phone]   Fax:  [phone]  Name: Hannah Jefferson MRN: [id number] Date of Birth: 12-17-1955

## 2018-08-03 ENCOUNTER — Encounter: Payer: Self-pay | Admitting: Physical Therapy

## 2018-08-03 ENCOUNTER — Ambulatory Visit: Payer: 59 | Admitting: Occupational Therapy

## 2018-08-03 ENCOUNTER — Ambulatory Visit: Payer: 59 | Admitting: Physical Therapy

## 2018-08-03 DIAGNOSIS — M6281 Muscle weakness (generalized): Secondary | ICD-10-CM

## 2018-08-03 DIAGNOSIS — R208 Other disturbances of skin sensation: Secondary | ICD-10-CM

## 2018-08-03 DIAGNOSIS — R2681 Unsteadiness on feet: Secondary | ICD-10-CM

## 2018-08-03 DIAGNOSIS — R278 Other lack of coordination: Secondary | ICD-10-CM

## 2018-08-03 NOTE — Therapy (Signed)
[facility] [address] [place], [place], [address] Phone: [phone]   Fax:  [phone]  Occupational Therapy Treatment  Patient Details  Name: Hannah Jefferson MRN: [id number] Date of Birth: [birth year] Referring Provider: Dr Naaman Plummer   Encounter Date: 08/03/2018  OT End of Session - 08/03/18 1544    Visit Number  10    Number of Visits  17    Date for OT Re-Evaluation  09/03/18    Authorization Type  U[employer] 20 visit limit OT    Authorization - Visit Number  10    Authorization - Number of Visits  20    OT Start Time  1400    OT Stop Time  1445    OT Time Calculation (min)  45 min    Activity Tolerance  Patient tolerated treatment well    Behavior During Therapy  [facility] for tasks assessed/performed       Past Medical History:  Diagnosis Date  . Chronic left hip pain   . GERD (gastroesophageal reflux disease)   . Gestational diabetes   . Hearing loss of both ears   . Hypertension   . Neuropathy    numbness/tingling from waist down  . Vision abnormalities     Past Surgical History:  Procedure Laterality Date  . IR IVC FILTER PLMT / S&I /IMG GUID/MOD SED  04/05/2018  . LAPAROSCOPIC CHOLECYSTECTOMY  1992  . REFRACTIVE SURGERY Bilateral 2004    There were no vitals filed for this visit.  Subjective Assessment - 08/03/18 1403    Pertinent History  surgery to remove tumor on spine 03/13/18  (Pended)     Currently in Pain?  No/denies  (Pended)          Quadraped: alternating UE/LE lifts bilaterally w/ CGA. Tall kneeling with trunk rotation bilaterally and greater control/tolerance today. Transitional movements from short to tall kneeling for pelvic control w/ less reliance on UE's (using physioball). Progressed to same activity while disengaging UE's entirely, but w/ max difficulty and only able to do 2 reps.  Standing at [place] working on wt shifts from Lt to Rt LE's during functional reaching w/ UE's  disengaged. Progressed to bilateral side stepping w/ one hand countertop support.  Pt static standing to perform bilateral UE reaching to retrieve/replace items in shelf. Progressed to step through patterns activating Lt side/pelvis w/ forwards and backwards stepping Rt LE and one hand countertop support.                      OT Short Term Goals - 07/27/18 1502      OT SHORT TERM GOAL #1   Title  Patient will complete a home exercise program designed to improve grip strength - due 08/04/18    Status  Achieved      OT SHORT TERM GOAL #2   Title  Patient wil stand in shower from tub transfer bench in shower to wash periarea with supervision    Status  On-going      OT SHORT TERM GOAL #3   Title  Patient will demonstrate understanding of graduated driving program, and/or driving resources to prepare for return to driving    Status  Achieved      OT [place] #4   Title  Patient will don socks and shoes, and fasten/tie shoes with modified independence    Status  Achieved        OT Long Term Goals - 08/03/18  [place] #1   Title  Patient will demonstrate sufficient activity tolerance and functional mobility to return work 4-6 hours per day following daily self care skills. - 09/03/2018    Time  8    Period  Weeks    Status  On-going      OT LONG TERM GOAL #2   Title  Patient will prepare a hot meal consisting of at least two items with no more than intermittent min assist    Time  8    Period  Weeks    Status  Achieved      OT LONG TERM GOAL #3   Title  Patient will complete simple housekeeping and laundry from ambulatory level without physical assistance    Time  8    Period  Weeks    Status  Achieved      OT LONG TERM GOAL #4   Title  Patient will demonstrate improved dynamic stand balance to stand without UE support for greater than 10 min while completing familiar functional task.      Time  8    Period  Weeks    Status   On-going            Plan - 08/03/18 1546    Clinical Impression Statement  Pt w/ increased standing tolerance today. Pt also w/ more pelvic/core control in tall kneeling    Occupational Profile and client history currently impacting functional performance  Patient is married, worked full time as Glass blower/designer, very active in her church with excellent extended family support    Occupational performance deficits (Please refer to evaluation for details):  ADL's;IADL's;Work;Leisure    Rehab Potential  Excellent    OT Frequency  2x / week    OT Duration  8 weeks    OT Treatment/Interventions  Self-care/ADL training;Therapeutic exercise;Aquatic Therapy;Balance training;Therapeutic activities;Functional Mobility Training;DME and/or AE instruction;Energy conservation;Patient/family education;Neuromuscular education;Stair Training    Plan  functional mobility, stand tolerance, decrease UE reliance in standing    Consulted and Agree with Plan of Care  Patient;Family member/caregiver       Patient will benefit from skilled therapeutic intervention in order to improve the following deficits and impairments:  Decreased knowledge of use of DME, Pain, Decreased mobility, Impaired sensation, Improper body mechanics, Decreased activity tolerance, Decreased endurance, Decreased strength, Increased muscle spasms, Improper spinal/pelvic alignment, Decreased balance, Difficulty walking  Visit Diagnosis: Unsteadiness on feet  Muscle weakness (generalized)  Other disturbances of skin sensation    Problem List Patient Active Problem List   Diagnosis Date Noted  . Anxiety and depression 05/30/2018  . E. coli UTI 04/27/2018  . Acute blood loss anemia   . Acute deep vein thrombosis (DVT) of right femoral vein ([place])   . Hyponatremia   . Hypokalemia   . Hypomagnesemia   . Ileus, postoperative ([place])   . Thoracic myelopathy 03/21/2018  . Neurogenic bowel 03/21/2018  . Neurogenic bladder 03/21/2018   . Left wrist fracture, sequela 03/21/2018  . Numbness 02/15/2018  . Ataxic gait 02/15/2018  . Left leg weakness 02/15/2018  . Other fatigue 02/15/2018  . Urinary incontinence 02/15/2018  . Bowel incontinence 02/15/2018    Carey Bullocks, OTR/L 08/03/2018, 3:47 PM  [facility] [address] [place], [place], [address] Phone: [phone]   Fax:  [phone]  Name: Hannah Jefferson MRN: [id number] Date of Birth: December 05, 1955

## 2018-08-03 NOTE — Therapy (Signed)
[facility] [address] [place], [place], [address] Phone: [phone]   Fax:  [phone]  Physical Therapy Treatment  Patient Details  Name: Hannah Jefferson MRN: [id number] Date of Birth: 08/25/1956 Referring Provider: Dr Naaman Plummer   Encounter Date: 08/03/2018  PT End of Session - 08/03/18 1513    Visit Number  9    Number of Visits  17    Date for PT Re-Evaluation  09/03/18    Authorization Type  [employer]: 20 visits PT and 20 visits OT    Authorization - Visit Number  9    Authorization - Number of Visits  20    PT Start Time  1326    PT Stop Time  1400    PT Time Calculation (min)  34 min    Activity Tolerance  Patient tolerated treatment well    Behavior During Therapy  [facility] for tasks assessed/performed       Past Medical History:  Diagnosis Date  . Chronic left hip pain   . GERD (gastroesophageal reflux disease)   . Gestational diabetes   . Hearing loss of both ears   . Hypertension   . Neuropathy    numbness/tingling from waist down  . Vision abnormalities     Past Surgical History:  Procedure Laterality Date  . IR IVC FILTER PLMT / S&I /IMG GUID/MOD SED  04/05/2018  . LAPAROSCOPIC CHOLECYSTECTOMY  1992  . REFRACTIVE SURGERY Bilateral 2004    There were no vitals filed for this visit.  Subjective Assessment - 08/03/18 1327    Subjective  Pt reports using the RW until her L hip pain resolves more. Pt reports her IT band stretches at home are improving her symptoms. Pt reports her power went out over the weekend and she was able to walk up her 10 steps at home using the lateral stepping technique with single HR.     Patient is accompained by:  Family member    Currently in Pain?  No/denies    Pain Onset  --                  [facility] Adult PT Treatment/Exercise - 08/03/18 1329      Transfers   Transfers  Sit to Stand;Stand to [employer] Transfers    Sit to Stand  6: Modified independent  (Device/Increase time)    Stand to Sit  6: Modified independent (Device/Increase time)    Stand Pivot Transfers  6: Modified independent (Device/Increase time)   increased time with no AD     Ambulation/Gait   Ambulation/Gait  Yes    Ambulation/Gait Assistance  5: Supervision;6: Modified independent (Device/Increase time)    Ambulation/Gait Assistance Details  Pt ambulates outdoors on uneven cement surfaces and ramp declines with supervision for 500 ft using RW. Pt required VC's to properly manage her RW when ambulating over uneven terrain to reduce fall risk. Pt ambulates inside on level surfaces for 115' with RW and Mod I with increased time.     Ambulation Distance (Feet)  500 Feet   500 ft outdoors and 115 ft indoors on level surface   Assistive device  Rolling walker    Gait Pattern  Step-through pattern;Decreased dorsiflexion - right;Decreased dorsiflexion - left;Decreased stride length;Wide base of support   bilateral ankle ER creating wider BOS   Ambulation Surface  Level;Unlevel;Indoor;Outdoor;Paved    Gait velocity  3.21 ft/sec    RW     Standardized Balance Assessment  Standardized Balance Assessment  Berg Balance Test;Timed Up and Go Test      Berg Balance Test   Sit to Stand  Able to stand without using hands and stabilize independently    Standing Unsupported  Able to stand safely 2 minutes    Sitting with Back Unsupported but Feet Supported on Floor or Stool  Able to sit safely and securely 2 minutes    Stand to Sit  Sits safely with minimal use of hands    Transfers  Able to transfer safely, minor use of hands    Standing Unsupported with Eyes Closed  Able to stand 10 seconds with supervision    Standing Ubsupported with Feet Together  Able to place feet together independently and stand for 1 minute with supervision    From Standing, Reach Forward with Outstretched Arm  Can reach confidently >25 cm (10")    From Standing Position, Pick up Object from Floor  Able to pick  up shoe, needs supervision    From Standing Position, Turn to Look Behind Over each Shoulder  Looks behind one side only/other side shows less weight shift    Turn 360 Degrees  Needs close supervision or verbal cueing    Standing Unsupported, Alternately Place Feet on Step/Stool  Able to complete >2 steps/needs minimal assist    Standing Unsupported, One Foot in Front  Able to place foot tandem independently and hold 30 seconds    Standing on One Leg  Tries to lift leg/unable to hold 3 seconds but remains standing independently    Total Score  43      Timed Up and Go Test   TUG  Normal TUG    Normal TUG (seconds)  12.97             PT Education - 08/03/18 1512    Education Details  Continuation of IT band stretches and HEP at home & clinical findings for STG reassessment     Person(s) Educated  Patient    Methods  Explanation    Comprehension  Verbalized understanding;Returned demonstration       PT Short Term Goals - 08/03/18 1513      PT SHORT TERM GOAL #1   Title  Pt will be IND in HEP to improve deficits listed above. TARGET DATE FOR ALL STGS: 08/02/18    Baseline  Pt reports ability to perform HEP independently- achieved on 9/19    Status  Achieved      PT SHORT TERM GOAL #2   Title  Pt will improve TUG time with LRAD to </=13.5sec. to decr. falls risk.     Baseline  TUG score improved to 12.97 seconds using RW- Achieved on 9/19     Status  Achieved      PT SHORT TERM GOAL #3   Title  Pt will improve gait speed to >/=2.20f/sec, with LRAD to safely amb. in the community.     Baseline  Gait speed improved to 3.21 ft/sec using RW- Achieved on 9/19     Status  Achieved      PT SHORT TERM GOAL #4   Title  Pt will amb. 500' over even terrain, with LRAD at MOD I Level to improve safety during functional mobility.     Baseline  Pt demonstrates ability to ambulate over even terrain Mod I using RW for 500 ft- Achieved on 9/19    Status  Achieved      PT SHORT TERM GOAL #5  Title  Perform BERG and write STG and LTG    Baseline  30/56    Status  Achieved      PT SHORT TERM GOAL #6   Title  Pt will improve BERG balance test to 34/56 in order to indicate decreased fall risk.     Time  4    Period  Weeks    Status  Achieved   Berg Score 43/56- goal achieved on 9/19       PT Long Term Goals - 08/06/18 1541      PT LONG TERM GOAL #1   Title  Pt will amb. 1000' over even/paved terrain with LRAD at MOD I level to improve functional mobility. TARGET DATE FOR ALL LTGS: 08/30/18    Status  New      PT LONG TERM GOAL #2   Title  Trial amb. without AD when appropriate and write goal as indicated.     Status  New      PT LONG TERM GOAL #3   Title  Pt will report no falls in the last 4 weeks to improve safety.     Status  New      PT LONG TERM GOAL #4   Title  Pt will improve BERG balance score to >/=50/56 in order to indicate decreased fall risk.     Baseline  43/56    Time  8    Period  Weeks    Status  Revised      PT LONG TERM GOAL #5   Title  Pt will perform floor recovery tranfser at S level in order to indicate safety in case of fall but also to ensure safety when playing in floor with family.     Time  8    Period  Weeks    Status  New            Plan - 08/03/18 1516    Clinical Impression Statement  Today's session primarily focused on STG reassessment. Pt demonstrates improvement in gait speed indicated by her score of 3.21 ft/sec using her RW. 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 Pt's balance has improved indicated by her Berg score improving by 11 points from 34/56 to 43/56; however, Pt continues to be categorized as significant fall risk. Pt's TUG time has improved to 12.97 seconds indicating normal fall risk potential using her RW. Pt demonstrates ability to ambulate over level surfaces Mod I with her RW and has indicated she routinely is able to ambulate further distances with  decreased fatigue. Overall, Pt has met 5/5 STG demonstrating improvement in strength, balance, and functional mobility.      Rehab Potential  Good    Clinical Impairments Affecting Rehab Potential  see above.     PT Frequency  2x / week    PT Duration  8 weeks    PT Treatment/Interventions  ADLs/Self Care Home Management;Biofeedback;Electrical Stimulation;Therapeutic exercise;Therapeutic activities;Functional mobility training;Stair training;Orthotic Fit/Training;Gait training;Patient/family education;DME Instruction;Neuromuscular re-education;Balance training;Vestibular    PT Next Visit Plan  ambulation with cane over level and uneven surfaces, turning with cane, eccentric quads for stair negotiation, Continue floor transfer, transitional movements, Add to HEP for balance, gait with quad tip cane, standing balance with emphasis on forward weight shift (lateral/foward), turns and negotiation around obstacles with cane    Consulted and Agree with Plan of Care  Patient;Family member/caregiver    Family Member Consulted  sister in law  Patient will benefit from skilled therapeutic intervention in order to improve the following deficits and impairments:  Abnormal gait, Decreased endurance, Impaired sensation, Decreased knowledge of use of DME, Decreased strength, Decreased balance, Decreased mobility, Decreased range of motion, Decreased coordination  Visit Diagnosis: Muscle weakness (generalized)  Other disturbances of skin sensation  Other lack of coordination     Problem List Patient Active Problem List   Diagnosis Date Noted  . Anxiety and depression 05/30/2018  . E. coli UTI 04/27/2018  . Acute blood loss anemia   . Acute deep vein thrombosis (DVT) of right femoral vein ([place])   . Hyponatremia   . Hypokalemia   . Hypomagnesemia   . Ileus, postoperative ([place])   . Thoracic myelopathy 03/21/2018  . Neurogenic bowel 03/21/2018  . Neurogenic bladder 03/21/2018  . Left wrist  fracture, sequela 03/21/2018  . Numbness 02/15/2018  . Ataxic gait 02/15/2018  . Left leg weakness 02/15/2018  . Other fatigue 02/15/2018  . Urinary incontinence 02/15/2018  . Bowel incontinence 02/15/2018    Floreen Comber, SPT 08/03/2018, 3:23 PM  [facility] [address] [place], [place], [address] Phone: [phone]   Fax:  [phone]  Name: Hannah Jefferson MRN: [id number] Date of Birth: 1956-01-19

## 2018-08-07 ENCOUNTER — Encounter: Payer: Self-pay | Admitting: Physical Therapy

## 2018-08-07 ENCOUNTER — Encounter: Payer: Self-pay | Admitting: Occupational Therapy

## 2018-08-07 ENCOUNTER — Ambulatory Visit: Payer: 59 | Admitting: Occupational Therapy

## 2018-08-07 ENCOUNTER — Ambulatory Visit: Payer: 59 | Admitting: Physical Therapy

## 2018-08-07 DIAGNOSIS — M6281 Muscle weakness (generalized): Secondary | ICD-10-CM

## 2018-08-07 DIAGNOSIS — R2681 Unsteadiness on feet: Secondary | ICD-10-CM

## 2018-08-07 DIAGNOSIS — I10 Essential (primary) hypertension: Secondary | ICD-10-CM | POA: Diagnosis not present

## 2018-08-07 DIAGNOSIS — R278 Other lack of coordination: Secondary | ICD-10-CM

## 2018-08-07 DIAGNOSIS — E782 Mixed hyperlipidemia: Secondary | ICD-10-CM | POA: Diagnosis not present

## 2018-08-07 DIAGNOSIS — R2689 Other abnormalities of gait and mobility: Secondary | ICD-10-CM

## 2018-08-07 DIAGNOSIS — R208 Other disturbances of skin sensation: Secondary | ICD-10-CM

## 2018-08-07 DIAGNOSIS — K219 Gastro-esophageal reflux disease without esophagitis: Secondary | ICD-10-CM | POA: Diagnosis not present

## 2018-08-07 NOTE — Therapy (Signed)
[facility] [address] [place], [place], [address] Phone: [phone]   Fax:  [phone]  Physical Therapy Treatment  Patient Details  Name: Hannah Jefferson MRN: [id number] Date of Birth: 04/29/1956 Referring Provider: Dr Naaman Plummer   Encounter Date: 08/07/2018  PT End of Session - 08/07/18 1244    Visit Number  10    Number of Visits  17    Date for PT Re-Evaluation  09/03/18    Authorization Type  [employer]: 20 visits PT and 20 visits OT    Authorization - Visit Number  10    Authorization - Number of Visits  20    PT Start Time  4010    PT Stop Time  1233    PT Time Calculation (min)  45 min    Behavior During Therapy  [facility] for tasks assessed/performed       Past Medical History:  Diagnosis Date  . Chronic left hip pain   . GERD (gastroesophageal reflux disease)   . Gestational diabetes   . Hearing loss of both ears   . Hypertension   . Neuropathy    numbness/tingling from waist down  . Vision abnormalities     Past Surgical History:  Procedure Laterality Date  . IR IVC FILTER PLMT / S&I /IMG GUID/MOD SED  04/05/2018  . LAPAROSCOPIC CHOLECYSTECTOMY  1992  . REFRACTIVE SURGERY Bilateral 2004    There were no vitals filed for this visit.  Subjective Assessment - 08/07/18 1150    Subjective  Pt practised driving this weekend.  Pt has been doing HEP and husband helped her with IT band massage with frozen water bottle which "helped quit a bit"    Currently in Pain?  No/denies                       [facility] Adult PT Treatment/Exercise - 08/07/18 0001      Ambulation/Gait   Ambulation/Gait  Yes    Ambulation/Gait Assistance  5: Supervision;4: Min guard    Ambulation/Gait Assistance Details  training with [place] instructing on sequence, posture, and steplength    Ambulation Distance (Feet)  200 Feet    Assistive device  Straight cane   with prongs   Gait Pattern  Step-through pattern;Decreased  dorsiflexion - right;Decreased dorsiflexion - left;Decreased stride length;Wide base of support    Ambulation Surface  Level;Indoor    Stairs  Yes    Stairs Assistance  5: Supervision    Stairs Assistance Details (indicate cue type and reason)  training with cane in L UE and rail on opposite side, cues for technique    Stair Management Technique  One rail Right;One rail Left;Alternating pattern;With cane    Ramp  5: Supervision    Ramp Details (indicate cue type and reason)  with SPC with prongs; cues for weight shifting and steplength    Curb  4: Min assist    Curb Details (indicate cue type and reason)  with SPC with prongs; cues for sequence and technique      Exercises   Exercises  Knee/Hip      Knee/Hip Exercises: Stretches   Gastroc Stretch  Both;2 reps;30 seconds   ball of foot on 2" block, holding on counter with upright posture     Knee/Hip Exercises: Supine   Other Supine Knee/Hip Exercises  R internal rotation x10 with 5 sec hold; cues to keep toes pointing to ceiling  Balance Exercises - 08/07/18 1300      Balance Exercises: Standing   Standing Eyes Closed  Narrow base of support (BOS);Foam/compliant surface;2 reps;20 secs   instructed on balance reactions; intermittent UE support   Stepping Strategy  Anterior;Posterior;UE support   cane (L UE ) counter on R side, intermittent; training of R LE control/balance in stance phase.          PT Short Term Goals - 08/03/18 1513      PT SHORT TERM GOAL #1   Title  Pt will be IND in HEP to improve deficits listed above. TARGET DATE FOR ALL STGS: 08/02/18    Baseline  Pt reports ability to perform HEP independently- achieved on 9/19    Status  Achieved      PT SHORT TERM GOAL #2   Title  Pt will improve TUG time with LRAD to </=13.5sec. to decr. falls risk.     Baseline  TUG score improved to 12.97 seconds using RW- Achieved on 9/19     Status  Achieved      PT SHORT TERM GOAL #3   Title  Pt will improve  gait speed to >/=2.58ft/sec, with LRAD to safely amb. in the community.     Baseline  Gait speed improved to 3.21 ft/sec using RW- Achieved on 9/19     Status  Achieved      PT SHORT TERM GOAL #4   Title  Pt will amb. 500' over even terrain, with LRAD at MOD I Level to improve safety during functional mobility.     Baseline  Pt demonstrates ability to ambulate over even terrain Mod I using RW for 500 ft- Achieved on 9/19    Status  Achieved      PT SHORT TERM GOAL #5   Title  Perform BERG and write STG and LTG    Baseline  30/56    Status  Achieved      PT SHORT TERM GOAL #6   Title  Pt will improve BERG balance test to 34/56 in order to indicate decreased fall risk.     Time  4    Period  Weeks    Status  Achieved   Berg Score 43/56- goal achieved on 9/19       PT Long Term Goals - 08/06/18 1541      PT LONG TERM GOAL #1   Title  Pt will amb. 1000' over even/paved terrain with LRAD at MOD I level to improve functional mobility. TARGET DATE FOR ALL LTGS: 08/30/18    Status  New      PT LONG TERM GOAL #2   Title  Trial amb. without AD when appropriate and write goal as indicated.     Status  New      PT LONG TERM GOAL #3   Title  Pt will report no falls in the last 4 weeks to improve safety.     Status  New      PT LONG TERM GOAL #4   Title  Pt will improve BERG balance score to >/=50/56 in order to indicate decreased fall risk.     Baseline  43/56    Time  8    Period  Weeks    Status  Revised      PT LONG TERM GOAL #5   Title  Pt will perform floor recovery tranfser at S level in order to indicate safety in case of fall but also to ensure  safety when playing in floor with family.     Time  8    Period  Weeks    Status  New            Plan - 08/07/18 1245    Clinical Impression Statement  Pt came to therapy with new SPC with prongs; adjusted cane to appropriate height. Gait training instructing pt on posture and upright gaze and technique to negotiate  community barriers; pt required close supervision to min guard occasionally when off balanced.  Noted LLE externally rotates during gait; pt mentioned past h/o of R ankle fracture, being in a cast and cam boot for a long period of time and noticing that her hip had started to hurt around that time.  Addressed R hip intermal rotator weakness and external rotator tightness with supine R hip internal rotations and standing gastroc stretch ( pt reporting feeling stretch all the way up through hip).                                                    Rehab Potential  Good    Clinical Impairments Affecting Rehab Potential  see above.     PT Frequency  2x / week    PT Duration  8 weeks    PT Treatment/Interventions  ADLs/Self Care Home Management;Biofeedback;Electrical Stimulation;Therapeutic exercise;Therapeutic activities;Functional mobility training;Stair training;Orthotic Fit/Training;Gait training;Patient/family education;DME Instruction;Neuromuscular re-education;Balance training;Vestibular    PT Next Visit Plan  ambulation with cane over level and uneven surfaces, turning with cane, eccentric quads for stair negotiation, Continue floor transfer, transitional movements, Add to HEP for balance, gait with quad tip cane, standing balance with emphasis on forward weight shift (lateral/foward), turns and negotiation around obstacles with cane    Consulted and Agree with Plan of Care  Patient;Family member/caregiver    Family Member Consulted  sister in law       Patient will benefit from skilled therapeutic intervention in order to improve the following deficits and impairments:  Abnormal gait, Decreased endurance, Impaired sensation, Decreased knowledge of use of DME, Decreased strength, Decreased balance, Decreased mobility, Decreased range of motion, Decreased coordination  Visit Diagnosis: Unsteadiness on feet  Muscle weakness (generalized)  Other disturbances of skin sensation  Other lack of  coordination     Problem List Patient Active Problem List   Diagnosis Date Noted  . Anxiety and depression 05/30/2018  . E. coli UTI 04/27/2018  . Acute blood loss anemia   . Acute deep vein thrombosis (DVT) of right femoral vein ([place])   . Hyponatremia   . Hypokalemia   . Hypomagnesemia   . Ileus, postoperative ([place])   . Thoracic myelopathy 03/21/2018  . Neurogenic bowel 03/21/2018  . Neurogenic bladder 03/21/2018  . Left wrist fracture, sequela 03/21/2018  . Numbness 02/15/2018  . Ataxic gait 02/15/2018  . Left leg weakness 02/15/2018  . Other fatigue 02/15/2018  . Urinary incontinence 02/15/2018  . Bowel incontinence 02/15/2018   Bjorn Loser, PTA  08/07/18, 1:07 PM [facility] [address] [place], [place], [address] Phone: [phone]   Fax:  [phone]  Name: Hannah Jefferson MRN: [id number] Date of Birth: October 27, 1956

## 2018-08-07 NOTE — Therapy (Signed)
[facility] [address] [place], [place], [address] Phone: [phone]   Fax:  [phone]  Occupational Therapy Treatment  Patient Details  Name: Hannah Jefferson MRN: [id number] Date of Birth: March 09, 1956 Referring Provider: Dr Naaman Plummer   Encounter Date: 08/07/2018  OT End of Session - 08/07/18 1516    Visit Number  11    Number of Visits  17    Date for OT Re-Evaluation  09/03/18    Authorization Type  [employer] 20 visit limit OT    Authorization - Visit Number  11    Authorization - Number of Visits  20    OT Start Time  1937    OT Stop Time  1400    OT Time Calculation (min)  39 min    Activity Tolerance  Patient tolerated treatment well    Behavior During Therapy  [facility] for tasks assessed/performed       Past Medical History:  Diagnosis Date  . Chronic left hip pain   . GERD (gastroesophageal reflux disease)   . Gestational diabetes   . Hearing loss of both ears   . Hypertension   . Neuropathy    numbness/tingling from waist down  . Vision abnormalities     Past Surgical History:  Procedure Laterality Date  . IR IVC FILTER PLMT / S&I /IMG GUID/MOD SED  04/05/2018  . LAPAROSCOPIC CHOLECYSTECTOMY  1992  . REFRACTIVE SURGERY Bilateral 2004    There were no vitals filed for this visit.  Subjective Assessment - 08/07/18 1325    Subjective   I guess I'm doing more than I realize.      Pertinent History  surgery to remove tumor on spine 03/13/18    Limitations  none    Currently in Pain?  No/denies    Pain Onset  More than a month ago       In quadruped, alternating UE lifts, forward/backward wt. Shifts, quadruped>side sitting to each side for incr core/stability.  In tall kneeling, UE diagonals with ball and rolling ball forward/backwards for incr core stability.  In standing, functional reaching without UE support with close supervision.   In standing, step and reach to the side for incr wt. Shift/balance  for IADLs with UE support.  In standing, wt. Shift with overhead reaching side>side.        OT Short Term Goals - 07/27/18 1502      OT SHORT TERM GOAL #1   Title  Patient will complete a home exercise program designed to improve grip strength - due 08/04/18    Status  Achieved      OT SHORT TERM GOAL #2   Title  Patient wil stand in shower from tub transfer bench in shower to wash periarea with supervision    Status  On-going      OT SHORT TERM GOAL #3   Title  Patient will demonstrate understanding of graduated driving program, and/or driving resources to prepare for return to driving    Status  Achieved      OT [place] #4   Title  Patient will don socks and shoes, and fasten/tie shoes with modified independence    Status  Achieved        OT Long Term Goals - 08/03/18 1545      OT LONG TERM GOAL #1   Title  Patient will demonstrate sufficient activity tolerance and functional mobility to return work 4-6 hours per day following daily self  care skills. - 09/03/2018    Time  8    Period  Weeks    Status  On-going      OT LONG TERM GOAL #2   Title  Patient will prepare a hot meal consisting of at least two items with no more than intermittent min assist    Time  8    Period  Weeks    Status  Achieved      OT LONG TERM GOAL #3   Title  Patient will complete simple housekeeping and laundry from ambulatory level without physical assistance    Time  8    Period  Weeks    Status  Achieved      OT LONG TERM GOAL #4   Title  Patient will demonstrate improved dynamic stand balance to stand without UE support for greater than 10 min while completing familiar functional task.      Time  8    Period  Weeks    Status  On-going            Plan - 08/07/18 1517    Clinical Impression Statement  Pt is progressing towards goals with improving IADL performance and improving standing tolerance.    Occupational Profile and client history currently impacting functional  performance  Patient is married, worked full time as Glass blower/designer, very active in her church with excellent extended family support    Occupational performance deficits (Please refer to evaluation for details):  ADL's;IADL's;Work;Leisure    Rehab Potential  Excellent    OT Frequency  2x / week    OT Duration  8 weeks    OT Treatment/Interventions  Self-care/ADL training;Therapeutic exercise;Aquatic Therapy;Balance training;Therapeutic activities;Functional Mobility Training;DME and/or AE instruction;Energy conservation;Patient/family education;Neuromuscular education;Stair Training    Plan  functional mobility, stand tolerance, decrease UE reliance in standing    Consulted and Agree with Plan of Care  Patient;Family member/caregiver       Patient will benefit from skilled therapeutic intervention in order to improve the following deficits and impairments:  Decreased knowledge of use of DME, Pain, Decreased mobility, Impaired sensation, Improper body mechanics, Decreased activity tolerance, Decreased endurance, Decreased strength, Increased muscle spasms, Improper spinal/pelvic alignment, Decreased balance, Difficulty walking  Visit Diagnosis: Muscle weakness (generalized)  Unsteadiness on feet  Other lack of coordination  Other abnormalities of gait and mobility    Problem List Patient Active Problem List   Diagnosis Date Noted  . Anxiety and depression 05/30/2018  . E. coli UTI 04/27/2018  . Acute blood loss anemia   . Acute deep vein thrombosis (DVT) of right femoral vein ([place])   . Hyponatremia   . Hypokalemia   . Hypomagnesemia   . Ileus, postoperative ([place])   . Thoracic myelopathy 03/21/2018  . Neurogenic bowel 03/21/2018  . Neurogenic bladder 03/21/2018  . Left wrist fracture, sequela 03/21/2018  . Numbness 02/15/2018  . Ataxic gait 02/15/2018  . Left leg weakness 02/15/2018  . Other fatigue 02/15/2018  . Urinary incontinence 02/15/2018  . Bowel incontinence  02/15/2018    [facility] 08/07/2018, 3:28 PM  [facility] [address] [place], [place], [address] Phone: [phone]   Fax:  [phone]  Name: Hannah Jefferson MRN: [id number] Date of Birth: 02-21-1956   Vianne Bulls, OTR/L [facility] [address] [place], [place]  [address] [phone] phone [phone] 08/07/18 3:28 PM

## 2018-08-07 NOTE — Patient Instructions (Signed)
`   Access Code: [email or web address]  URL: [email or web address]  Date: 08/07/2018  Prepared by: Bjorn Loser   Exercises Supine Hip Internal and External Rotation - 10 reps - 5 sec hold - 1-2x daily - 5x weekly Standing Gastroc Stretch on Step - 2 reps - 1 sets - 30 hold - 1-2x daily - 5x weeklyFeet Together (Compliant Surface) Arm Motion - Eyes Closed    Stand on compliant surface: ___pillow_____ with feet together. Close eyes and keep arms by your side.  Repeat __3__ times per session for 15 secs. Do _1-2___ sessions per day.  Copyright  VHI. All rights reserved.

## 2018-08-09 ENCOUNTER — Encounter: Payer: Self-pay | Admitting: Occupational Therapy

## 2018-08-09 ENCOUNTER — Encounter: Payer: Self-pay | Admitting: Rehabilitative and Restorative Service Providers"

## 2018-08-09 ENCOUNTER — Encounter: Payer: 59 | Admitting: Occupational Therapy

## 2018-08-09 ENCOUNTER — Ambulatory Visit: Payer: 59 | Admitting: Rehabilitative and Restorative Service Providers"

## 2018-08-09 ENCOUNTER — Ambulatory Visit: Payer: 59 | Admitting: Occupational Therapy

## 2018-08-09 DIAGNOSIS — R278 Other lack of coordination: Secondary | ICD-10-CM

## 2018-08-09 DIAGNOSIS — R2689 Other abnormalities of gait and mobility: Secondary | ICD-10-CM

## 2018-08-09 DIAGNOSIS — M6281 Muscle weakness (generalized): Secondary | ICD-10-CM

## 2018-08-09 DIAGNOSIS — R2681 Unsteadiness on feet: Secondary | ICD-10-CM

## 2018-08-09 DIAGNOSIS — Z9181 History of falling: Secondary | ICD-10-CM | POA: Diagnosis not present

## 2018-08-09 DIAGNOSIS — G8220 Paraplegia, unspecified: Secondary | ICD-10-CM | POA: Diagnosis not present

## 2018-08-09 NOTE — Therapy (Signed)
[facility] [address] [place], [place], [address] Phone: [phone]   Fax:  [phone]  Occupational Therapy Treatment  Patient Details  Name: Hannah Jefferson MRN: [id number] Date of Birth: Jun 14, 1956 Referring Provider: Dr Naaman Plummer   Encounter Date: 08/09/2018  OT End of Session - 08/09/18 2123    Visit Number  12    Number of Visits  17    Date for OT Re-Evaluation  09/03/18    Authorization Type  UHC 20 visit limit OT    Authorization - Visit Number  12    Authorization - Number of Visits  20    OT Start Time  1346    OT Stop Time  1435    OT Time Calculation (min)  49 min    Activity Tolerance  Patient tolerated treatment well       Past Medical History:  Diagnosis Date  . Chronic left hip pain   . GERD (gastroesophageal reflux disease)   . Gestational diabetes   . Hearing loss of both ears   . Hypertension   . Neuropathy    numbness/tingling from waist down  . Vision abnormalities     Past Surgical History:  Procedure Laterality Date  . IR IVC FILTER PLMT / S&I /IMG GUID/MOD SED  04/05/2018  . LAPAROSCOPIC CHOLECYSTECTOMY  1992  . REFRACTIVE SURGERY Bilateral 2004    There were no vitals filed for this visit.  Subjective Assessment - 08/09/18 2121    Subjective   My L hip yesterday after therapy but it feels better today.     Patient is accompained by:  --   friend   Pertinent History  surgery to remove tumor on spine 03/13/18    Limitations  none    Currently in Pain?  No/denies         Pt seen for aquatic therapy for first time today.  Pt entered pool via steps using both hand rails and close supervision and cueing.  Pt treated in water 2.5 - 4 feet depending upon activity.  Pt was first educated on basic principles of aquatic therapy and brief discussion of land vs water therapy.  Pt verbalized understanding.  Initially introduced hand held functional ambulation starting in more shallow  water and moving into deeper water. Initially bilateral hand held assist moving to single hand held assist and eventually to no UE support and supervision.  Treatment focused on dynamic standing balance with both wide and more narrow BOS with emphasis on basic hip strategy and use of UE's against water to maintain balance. Also addressed postural alignment as pt tends to present with hip flexion and posterior placement of COG with shoulders and head positioned slightly forward.  Addressed LE strengthening for hip ab/adduction as well as hip extension with BUE support on pool wall. Pt initially needed mod facilitation and cues to move at hips only and maintain core alignment and control - with practice pt able to maintain with vc's only and intermittent min a.  Addressed LE strengthening and single leg stance with opposite leg on noodle in hip flexion moving into hip and knee extension (BUE support).  Addressed hip ER/IR with hip and knee flexed on noodle.  Focused also on using LLE as strong stabilizer with movement on R side to increased demand on LLE.  Treatment also focused on assisting pt in decreasing bilateral shoulder hike/head holding as place to stabilize for postural control and to begin to increased demand on  core and LE"s for standing and functional ambulation.  Pt exited the pool via steps using both hand rails and supervision however pt with much better postural alignment when exiting pool.                      OT Short Term Goals - 08/09/18 2122      OT SHORT TERM GOAL #1   Title  Patient will complete a home exercise program designed to improve grip strength - due 08/04/18    Status  Achieved      OT SHORT TERM GOAL #2   Title  Patient wil stand in shower from tub transfer bench in shower to wash periarea with supervision    Status  On-going      OT SHORT TERM GOAL #3   Title  Patient will demonstrate understanding of graduated driving program, and/or driving resources  to prepare for return to driving    Status  Achieved      OT [place] #4   Title  Patient will don socks and shoes, and fasten/tie shoes with modified independence    Status  Achieved        OT Long Term Goals - 08/09/18 2122      OT LONG TERM GOAL #1   Title  Patient will demonstrate sufficient activity tolerance and functional mobility to return work 4-6 hours per day following daily self care skills. - 09/03/2018    Time  8    Period  Weeks    Status  On-going      OT LONG TERM GOAL #2   Title  Patient will prepare a hot meal consisting of at least two items with no more than intermittent min assist    Time  8    Period  Weeks    Status  Achieved      OT LONG TERM GOAL #3   Title  Patient will complete simple housekeeping and laundry from ambulatory level without physical assistance    Time  8    Period  Weeks    Status  Achieved      OT LONG TERM GOAL #4   Title  Patient will demonstrate improved dynamic stand balance to stand without UE support for greater than 10 min while completing familiar functional task.      Time  8    Period  Weeks    Status  On-going            Plan - 08/09/18 2122    Clinical Impression Statement  Pt progressing toward goals. Pt responded well to aquatic therapy today.     Occupational Profile and client history currently impacting functional performance  Patient is married, worked full time as Glass blower/designer, very active in her church with excellent extended family support    Occupational performance deficits (Please refer to evaluation for details):  ADL's;IADL's;Work;Leisure    Rehab Potential  Excellent    OT Frequency  2x / week    OT Duration  8 weeks    OT Treatment/Interventions  Self-care/ADL training;Therapeutic exercise;Aquatic Therapy;Balance training;Therapeutic activities;Functional Mobility Training;DME and/or AE instruction;Energy conservation;Patient/family education;Neuromuscular education;Stair Training     Plan  functional mobility, stand tolerance, decrease UE reliance in standing    Consulted and Agree with Plan of Care  Patient;Other (Comment)   friend      Patient will benefit from skilled therapeutic intervention in order to improve the following deficits and impairments:  Decreased  knowledge of use of DME, Pain, Decreased mobility, Impaired sensation, Improper body mechanics, Decreased activity tolerance, Decreased endurance, Decreased strength, Increased muscle spasms, Improper spinal/pelvic alignment, Decreased balance, Difficulty walking  Visit Diagnosis: Muscle weakness (generalized)  Unsteadiness on feet  Other lack of coordination    Problem List Patient Active Problem List   Diagnosis Date Noted  . Anxiety and depression 05/30/2018  . E. coli UTI 04/27/2018  . Acute blood loss anemia   . Acute deep vein thrombosis (DVT) of right femoral vein ([place])   . Hyponatremia   . Hypokalemia   . Hypomagnesemia   . Ileus, postoperative ([place])   . Thoracic myelopathy 03/21/2018  . Neurogenic bowel 03/21/2018  . Neurogenic bladder 03/21/2018  . Left wrist fracture, sequela 03/21/2018  . Numbness 02/15/2018  . Ataxic gait 02/15/2018  . Left leg weakness 02/15/2018  . Other fatigue 02/15/2018  . Urinary incontinence 02/15/2018  . Bowel incontinence 02/15/2018    Quay Burow, OTR/L 08/09/2018, 9:25 PM  [facility] [address] [place], [place], [address] Phone: [phone]   Fax:  [phone]  Name: Hannah Jefferson MRN: [id number] Date of Birth: [birth year]

## 2018-08-09 NOTE — Patient Instructions (Signed)

## 2018-08-09 NOTE — Therapy (Signed)
[facility] [address] [place], [place], [address] Phone: [phone]   Fax:  [phone]  Physical Therapy Treatment  Patient Details  Name: Hannah Jefferson MRN: [id number] Date of Birth: 11/30/1955 Referring Provider: Dr Naaman Plummer   Encounter Date: 08/09/2018  PT End of Session - 08/09/18 1209    Visit Number  11    Number of Visits  17    Date for PT Re-Evaluation  09/03/18    Authorization Type  UHC: 20 visits PT and 20 visits OT    Authorization - Visit Number  11    Authorization - Number of Visits  20    PT Start Time  1106    PT Stop Time  1150    PT Time Calculation (min)  44 min    Equipment Utilized During Treatment  Gait belt    Activity Tolerance  Patient tolerated treatment well    Behavior During Therapy  WFL for tasks assessed/performed       Past Medical History:  Diagnosis Date  . Chronic left hip pain   . GERD (gastroesophageal reflux disease)   . Gestational diabetes   . Hearing loss of both ears   . Hypertension   . Neuropathy    numbness/tingling from waist down  . Vision abnormalities     Past Surgical History:  Procedure Laterality Date  . IR IVC FILTER PLMT / S&I /IMG GUID/MOD SED  04/05/2018  . LAPAROSCOPIC CHOLECYSTECTOMY  1992  . REFRACTIVE SURGERY Bilateral 2004    There were no vitals filed for this visit.  Subjective Assessment - 08/09/18 1112    Subjective  The patient brought SPC with quad tip today.  She drove to her sister in [place] today to pick her up.   She is going to [employer] today.     Pertinent History  B DVTs s/p 4/20019 surgery with IVC filter in place, hx of gestation DM only, HTN, Chronic L hip pain, scoliosis, hiatal hernia    Patient Stated Goals  I want to get back as much as possible.     Currently in Pain?  No/denies                       [facility] Adult PT Treatment/Exercise - 08/09/18 1120      Ambulation/Gait   Ambulation/Gait   Yes    Ambulation/Gait Assistance  5: Supervision;4: Min guard    Ambulation/Gait Assistance Details  PT arrived with straight cane with quad tip and PT provided CGA initially walking into clinic.  Was able to reduce CGA to close supervision level during session.  Patient utilizes L hip external rotation with toeing out *she notes some of this may be chronic due to h/o ankle injuries L side.  PT provided verbal and demo cues for foot placement during gait activities.    Ambulation Distance (Feet)  230 Feet   115 x 2   Assistive device  Straight cane   with quad tip   Gait Pattern  Step-through pattern;Decreased dorsiflexion - right;Decreased dorsiflexion - left;Decreased stride length;Wide base of support    Ambulation Surface  Level;Indoor      Neuro Re-ed    Neuro Re-ed Details   Standing wall bumps working on hip/ankle strategy x 10 reps.        Exercises   Exercises  Other Exercises    Other Exercises   QUADRIPED:  hip extension x 5 reps R and  L with cues for core engagement, tal kneeling to 1/2 sitting with feet off met x 5 reps. RIGHT SIDELYING:  performed L IT band stretching with hip extension with passive overpressure and cues to reduce trunk rotation.  SUPINE:  trunk rotation, then with feet wider did LE rotation for hips.  STANDING:  wall slides with min A/ cues  at left leg.  At counter, heel raises and toe raises dec'ing weight through UEs.       Manual Therapy   Manual Therapy  Soft tissue mobilization    Manual therapy comments  L I T Band    Soft tissue mobilization  L IT band trigger point release with soft tissue mobilization.  Patient has tightness anterior to IT band (mid thigh) and posterior to IT band (distally).               PT Short Term Goals - 08/03/18 1513      PT SHORT TERM GOAL #1   Title  Pt will be IND in HEP to improve deficits listed above. TARGET DATE FOR ALL STGS: 08/02/18    Baseline  Pt reports ability to perform HEP independently- achieved on  9/19    Status  Achieved      PT SHORT TERM GOAL #2   Title  Pt will improve TUG time with LRAD to </=13.5sec. to decr. falls risk.     Baseline  TUG score improved to 12.97 seconds using RW- Achieved on 9/19     Status  Achieved      PT SHORT TERM GOAL #3   Title  Pt will improve gait speed to >/=2.27f/sec, with LRAD to safely amb. in the community.     Baseline  Gait speed improved to 3.21 ft/sec using RW- Achieved on 9/19     Status  Achieved      PT SHORT TERM GOAL #4   Title  Pt will amb. 500' over even terrain, with LRAD at MOD I Level to improve safety during functional mobility.     Baseline  Pt demonstrates ability to ambulate over even terrain Mod I using RW for 500 ft- Achieved on 9/19    Status  Achieved      PT SHORT TERM GOAL #5   Title  Perform BERG and write STG and LTG    Baseline  30/56    Status  Achieved      PT SHORT TERM GOAL #6   Title  Pt will improve BERG balance test to 34/56 in order to indicate decreased fall risk.     Time  4    Period  Weeks    Status  Achieved   Berg Score 43/56- goal achieved on 9/19       PT Long Term Goals - 08/06/18 1541      PT LONG TERM GOAL #1   Title  Pt will amb. 1000' over even/paved terrain with LRAD at MOD I level to improve functional mobility. TARGET DATE FOR ALL LTGS: 08/30/18    Status  New      PT LONG TERM GOAL #2   Title  Trial amb. without AD when appropriate and write goal as indicated.     Status  New      PT LONG TERM GOAL #3   Title  Pt will report no falls in the last 4 weeks to improve safety.     Status  New      PT LONG TERM GOAL #4  Title  Pt will improve BERG balance score to >/=50/56 in order to indicate decreased fall risk.     Baseline  43/56    Time  8    Period  Weeks    Status  Revised      PT LONG TERM GOAL #5   Title  Pt will perform floor recovery tranfser at S level in order to indicate safety in case of fall but also to ensure safety when playing in floor with family.      Time  8    Period  Weeks    Status  New            Plan - 08/09/18 1211    Clinical Impression Statement  The patient has increased L hip discomfort with standing activities. PT ended with IT band stretch and recommended dry needling to reduce trigger points.     PT Frequency  2x / week    PT Duration  8 weeks    PT Treatment/Interventions  ADLs/Self Care Home Management;Biofeedback;Electrical Stimulation;Therapeutic exercise;Therapeutic activities;Functional mobility training;Stair training;Orthotic Fit/Training;Gait training;Patient/family education;DME Instruction;Neuromuscular re-education;Balance training;Vestibular;Dry needling    PT Next Visit Plan  Cane with emphasis on mechanics (dec'ing L toe out), cane on community surfaces, eccentric quads for stair negotiation, floor transfers.  Dynamic gait and higher level Balance.  ADDING CERT to include dry needling due to trigger points L IT Band.    Consulted and Agree with Plan of Care  Patient;Family member/caregiver    Family Member Consulted  sister in law       Patient will benefit from skilled therapeutic intervention in order to improve the following deficits and impairments:  Abnormal gait, Decreased endurance, Impaired sensation, Decreased knowledge of use of DME, Decreased strength, Decreased balance, Decreased mobility, Decreased range of motion, Decreased coordination  Visit Diagnosis: Muscle weakness (generalized)  Unsteadiness on feet  Other abnormalities of gait and mobility     Problem List Patient Active Problem List   Diagnosis Date Noted  . Anxiety and depression 05/30/2018  . E. coli UTI 04/27/2018  . Acute blood loss anemia   . Acute deep vein thrombosis (DVT) of right femoral vein ([place])   . Hyponatremia   . Hypokalemia   . Hypomagnesemia   . Ileus, postoperative ([place])   . Thoracic myelopathy 03/21/2018  . Neurogenic bowel 03/21/2018  . Neurogenic bladder 03/21/2018  . Left wrist fracture,  sequela 03/21/2018  . Numbness 02/15/2018  . Ataxic gait 02/15/2018  . Left leg weakness 02/15/2018  . Other fatigue 02/15/2018  . Urinary incontinence 02/15/2018  . Bowel incontinence 02/15/2018    Rosendo Couser, PT 08/09/2018, 12:15 PM  [facility] [address] [place], [place], [address] Phone: [phone]   Fax:  [phone]  Name: AINSLEE SOU MRN: [id number] Date of Birth: Jul 24, 1956

## 2018-08-14 ENCOUNTER — Ambulatory Visit: Payer: 59 | Admitting: Occupational Therapy

## 2018-08-14 ENCOUNTER — Ambulatory Visit: Payer: 59 | Admitting: Rehabilitation

## 2018-08-14 DIAGNOSIS — M6281 Muscle weakness (generalized): Secondary | ICD-10-CM

## 2018-08-14 DIAGNOSIS — R2681 Unsteadiness on feet: Secondary | ICD-10-CM

## 2018-08-14 NOTE — Therapy (Signed)
[facility] [address] [place], [place], [address] Phone: [phone]   Fax:  [phone]  Occupational Therapy Treatment  Patient Details  Name: Hannah Jefferson MRN: [id number] Date of Birth: [birth year] No data recorded  Encounter Date: 08/14/2018  OT End of Session - 08/14/18 1407    Visit Number  13    Number of Visits  17    Date for OT Re-Evaluation  09/03/18    Authorization Type  UHC 20 visit limit OT    Authorization - Visit Number  13    Authorization - Number of Visits  20    Activity Tolerance  Patient tolerated treatment well    Behavior During Therapy  [facility] for tasks assessed/performed       Past Medical History:  Diagnosis Date  . Chronic left hip pain   . GERD (gastroesophageal reflux disease)   . Gestational diabetes   . Hearing loss of both ears   . Hypertension   . Neuropathy    numbness/tingling from waist down  . Vision abnormalities     Past Surgical History:  Procedure Laterality Date  . IR IVC FILTER PLMT / S&I /IMG GUID/MOD SED  04/05/2018  . LAPAROSCOPIC CHOLECYSTECTOMY  1992  . REFRACTIVE SURGERY Bilateral 2004    There were no vitals filed for this visit.  Subjective Assessment - 08/14/18 1320    Pertinent History  surgery to remove tumor on spine 03/13/18  (Pended)     Limitations  none  (Pended)     Currently in Pain?  No/denies  (Pended)         Neuro re-educ:  Seated on physioball - working on A/P pelvic tilts; followed by lateral trunk flexion and wt shifts; then progressed to combined movements in pelvic circumduction w/ CGA to min assist prn. Pt w/ LOB on ball x 2.  Tall kneeling to short kneeling first w/ light UE support, then w/o UE support. Progressed to dynamic movements upper body while maintaining tall kneeling. Quadraped: alternating UE/LE lifts for controlled wt shifts and core control.  Standing: wt shifts laterally for functional UE abduction/lateral  reaching, followed by controlled sit to stand movements w/ light UE support, then without. Followed by weight shifting and step through patterns w/ very light UE support.                      OT Short Term Goals - 08/09/18 2122      OT SHORT TERM GOAL #1   Title  Patient will complete a home exercise program designed to improve grip strength - due 08/04/18    Status  Achieved      OT SHORT TERM GOAL #2   Title  Patient wil stand in shower from tub transfer bench in shower to wash periarea with supervision    Status  On-going      OT SHORT TERM GOAL #3   Title  Patient will demonstrate understanding of graduated driving program, and/or driving resources to prepare for return to driving    Status  Achieved      OT [place] #4   Title  Patient will don socks and shoes, and fasten/tie shoes with modified independence    Status  Achieved        OT Long Term Goals - 08/09/18 2122      OT LONG TERM GOAL #1   Title  Patient will demonstrate sufficient activity tolerance and functional  mobility to return work 4-6 hours per day following daily self care skills. - 09/03/2018    Time  8    Period  Weeks    Status  On-going      OT LONG TERM GOAL #2   Title  Patient will prepare a hot meal consisting of at least two items with no more than intermittent min assist    Time  8    Period  Weeks    Status  Achieved      OT LONG TERM GOAL #3   Title  Patient will complete simple housekeeping and laundry from ambulatory level without physical assistance    Time  8    Period  Weeks    Status  Achieved      OT LONG TERM GOAL #4   Title  Patient will demonstrate improved dynamic stand balance to stand without UE support for greater than 10 min while completing familiar functional task.      Time  8    Period  Weeks    Status  On-going            Plan - 08/14/18 1408    Clinical Impression Statement  Pt with increased pelvic control and endurance for tall  kneeling and standing activities    Occupational Profile and client history currently impacting functional performance  Patient is married, worked full time as Glass blower/designer, very active in her church with excellent extended family support    Occupational performance deficits (Please refer to evaluation for details):  ADL's;IADL's;Work;Leisure    Rehab Potential  Excellent    OT Frequency  2x / week    OT Duration  8 weeks    OT Treatment/Interventions  Self-care/ADL training;Therapeutic exercise;Aquatic Therapy;Balance training;Therapeutic activities;Functional Mobility Training;DME and/or AE instruction;Energy conservation;Patient/family education;Neuromuscular education;Stair Training    Plan  Aquatic therapy next session followed by land session to work on functional mobility, stand tolerance, decrease UE reliance in standing    Consulted and Agree with Plan of Care  Patient       Patient will benefit from skilled therapeutic intervention in order to improve the following deficits and impairments:  Decreased knowledge of use of DME, Pain, Decreased mobility, Impaired sensation, Improper body mechanics, Decreased activity tolerance, Decreased endurance, Decreased strength, Increased muscle spasms, Improper spinal/pelvic alignment, Decreased balance, Difficulty walking  Visit Diagnosis: Muscle weakness (generalized)  Unsteadiness on feet    Problem List Patient Active Problem List   Diagnosis Date Noted  . Anxiety and depression 05/30/2018  . E. coli UTI 04/27/2018  . Acute blood loss anemia   . Acute deep vein thrombosis (DVT) of right femoral vein ([place])   . Hyponatremia   . Hypokalemia   . Hypomagnesemia   . Ileus, postoperative ([place])   . Thoracic myelopathy 03/21/2018  . Neurogenic bowel 03/21/2018  . Neurogenic bladder 03/21/2018  . Left wrist fracture, sequela 03/21/2018  . Numbness 02/15/2018  . Ataxic gait 02/15/2018  . Left leg weakness 02/15/2018  . Other fatigue  02/15/2018  . Urinary incontinence 02/15/2018  . Bowel incontinence 02/15/2018    Carey Bullocks, OTR/L 08/14/2018, 2:11 PM  [facility] [address] [place], [place], [address] Phone: [phone]   Fax:  [phone]  Name: MURIELLE STANG MRN: [id number] Date of Birth: 12-12-1955

## 2018-08-16 ENCOUNTER — Ambulatory Visit: Payer: 59 | Admitting: Occupational Therapy

## 2018-08-16 ENCOUNTER — Encounter: Payer: Self-pay | Admitting: Occupational Therapy

## 2018-08-16 ENCOUNTER — Encounter: Payer: Self-pay | Admitting: Physical Therapy

## 2018-08-16 ENCOUNTER — Ambulatory Visit: Payer: 59 | Attending: Family Medicine | Admitting: Physical Therapy

## 2018-08-16 ENCOUNTER — Encounter: Payer: 59 | Admitting: Occupational Therapy

## 2018-08-16 DIAGNOSIS — R278 Other lack of coordination: Secondary | ICD-10-CM | POA: Diagnosis present

## 2018-08-16 DIAGNOSIS — R293 Abnormal posture: Secondary | ICD-10-CM | POA: Insufficient documentation

## 2018-08-16 DIAGNOSIS — R2681 Unsteadiness on feet: Secondary | ICD-10-CM | POA: Diagnosis present

## 2018-08-16 DIAGNOSIS — M6281 Muscle weakness (generalized): Secondary | ICD-10-CM

## 2018-08-16 DIAGNOSIS — R208 Other disturbances of skin sensation: Secondary | ICD-10-CM | POA: Diagnosis not present

## 2018-08-16 DIAGNOSIS — R2689 Other abnormalities of gait and mobility: Secondary | ICD-10-CM | POA: Insufficient documentation

## 2018-08-16 NOTE — Therapy (Signed)
[facility] [address] [place], [place], [address] Phone: [phone]   Fax:  [phone]  Physical Therapy Treatment  Patient Details  Name: Hannah Jefferson MRN: [id number] Date of Birth: 1956-06-14 Referring Provider (PT): Dr Naaman Plummer   Encounter Date: 08/16/2018  PT End of Session - 08/16/18 1224    Visit Number  12    Number of Visits  17    Date for PT Re-Evaluation  09/03/18    Authorization Type  UHC: 20 visits PT and 20 visits OT    Authorization - Visit Number  12    Authorization - Number of Visits  20    PT Start Time  1100    PT Stop Time  1145    PT Time Calculation (min)  45 min    Equipment Utilized During Treatment  Gait belt    Activity Tolerance  Patient tolerated treatment well    Behavior During Therapy  WFL for tasks assessed/performed       Past Medical History:  Diagnosis Date  . Chronic left hip pain   . GERD (gastroesophageal reflux disease)   . Gestational diabetes   . Hearing loss of both ears   . Hypertension   . Neuropathy    numbness/tingling from waist down  . Vision abnormalities     Past Surgical History:  Procedure Laterality Date  . IR IVC FILTER PLMT / S&I /IMG GUID/MOD SED  04/05/2018  . LAPAROSCOPIC CHOLECYSTECTOMY  1992  . REFRACTIVE SURGERY Bilateral 2004    There were no vitals filed for this visit.  Subjective Assessment - 08/16/18 1106    Subjective  Pt reports she drove herself to the clinic today and reports everything has been going well. Pt reports her IT band continues to be tight and would like to try dry needling to reduce tightness.    Patient is accompained by:  Family member    Pertinent History  B DVTs s/p 4/20019 surgery with IVC filter in place, hx of gestation DM only, HTN, Chronic L hip pain, scoliosis, hiatal hernia    Patient Stated Goals  I want to get back as much as possible.     Currently in Pain?  No/denies                        [facility] Adult PT Treatment/Exercise - 08/16/18 1111      Transfers   Transfers  Sit to Stand;Stand to [employer] Transfers    Sit to Stand  6: Modified independent (Device/Increase time)    Stand to Sit  6: Modified independent (Device/Increase time)    Stand Pivot Transfers  6: Modified independent (Device/Increase time)      Ambulation/Gait   Ambulation/Gait  Yes    Ambulation/Gait Assistance  5: Supervision;4: Min guard    Ambulation/Gait Assistance Details  Pt performs ambulation outdoors with her cane over uneven paved surfaces, grass, curbs, and inclines. Pt demonstrates ability to vary gait speed and perform head turns in all directions when cued with minimal sway. Pt performs anterograde/retrograde ambulation over mulch and gravel with therapist providing VC's to maintain proper step width to prevent retrograde tandem walking. Pt demonstrates significant wide BOS with bilateral foot out with patient indicating inability to correct. Therapist provides cues to maintain forward gaze to optimize safety with gait.     Ambulation Distance (Feet)  500 Feet    Assistive device  Other (Comment)   patients  cane with wider end point   Gait Pattern  Step-through pattern;Decreased dorsiflexion - right;Decreased dorsiflexion - left;Decreased stride length;Wide base of support   bilateral ankles positioned in toe out    Ambulation Surface  Outdoor;Paved;Gravel;Grass;Unlevel    Curb  5: Supervision    Curb Details (indicate cue type and reason)  Pt able to descend curb safely with cane utilizing proper technique. Therapist verbalized and demonstrated correct technique to ascend curb with cane with patient performing subsequent trials with good carry over.      Exercises   Exercises  Knee/Hip      Knee/Hip Exercises: Stretches   ITB Stretch  Left;2 reps;30 seconds             PT Education - 08/16/18 1110    Education Details  Therapist provided  education regarding implications for dry needling and expected symptoms following treatment to IT band, proper height of cane, ascending curb safely with cane, and correct technique to perform IT band stretch.     Person(s) Educated  Patient    Methods  Explanation    Comprehension  Verbalized understanding;Returned demonstration       PT Short Term Goals - 08/03/18 1513      PT SHORT TERM GOAL #1   Title  Pt will be IND in HEP to improve deficits listed above. TARGET DATE FOR ALL STGS: 08/02/18    Baseline  Pt reports ability to perform HEP independently- achieved on 9/19    Status  Achieved      PT SHORT TERM GOAL #2   Title  Pt will improve TUG time with LRAD to </=13.5sec. to decr. falls risk.     Baseline  TUG score improved to 12.97 seconds using RW- Achieved on 9/19     Status  Achieved      PT SHORT TERM GOAL #3   Title  Pt will improve gait speed to >/=2.101ft/sec, with LRAD to safely amb. in the community.     Baseline  Gait speed improved to 3.21 ft/sec using RW- Achieved on 9/19     Status  Achieved      PT SHORT TERM GOAL #4   Title  Pt will amb. 500' over even terrain, with LRAD at MOD I Level to improve safety during functional mobility.     Baseline  Pt demonstrates ability to ambulate over even terrain Mod I using RW for 500 ft- Achieved on 9/19    Status  Achieved      PT SHORT TERM GOAL #5   Title  Perform BERG and write STG and LTG    Baseline  30/56    Status  Achieved      PT SHORT TERM GOAL #6   Title  Pt will improve BERG balance test to 34/56 in order to indicate decreased fall risk.     Time  4    Period  Weeks    Status  Achieved   Berg Score 43/56- goal achieved on 9/19       PT Long Term Goals - 08/06/18 1541      PT LONG TERM GOAL #1   Title  Pt will amb. 1000' over even/paved terrain with LRAD at MOD I level to improve functional mobility. TARGET DATE FOR ALL LTGS: 08/30/18    Status  New      PT LONG TERM GOAL #2   Title  Trial amb.  without AD when appropriate and write goal as indicated.  Status  New      PT LONG TERM GOAL #3   Title  Pt will report no falls in the last 4 weeks to improve safety.     Status  New      PT LONG TERM GOAL #4   Title  Pt will improve BERG balance score to >/=50/56 in order to indicate decreased fall risk.     Baseline  43/56    Time  8    Period  Weeks    Status  Revised      PT LONG TERM GOAL #5   Title  Pt will perform floor recovery tranfser at S level in order to indicate safety in case of fall but also to ensure safety when playing in floor with family.     Time  8    Period  Weeks    Status  New            Plan - 08/16/18 1224    Clinical Impression Statement  Today's session primarily focused on therapist performing dry needling to L IT band for relief of patient symptoms, community ambulation with her cane, and correct technique to perform L IT band stretch at home to promote reduction in tightness. Pt demonstrates increased sway and widens her BOS when ambulating outdoors over uneven surfaces 2/2 to balance deficits. Pt attempts to ambulate with ankles in more neutral alignment; however, demonstrates increased LOB 2/2 to inability to focus on LE placement and dynamic gait. Pt will continue to benefit from skilled physical therapy to address IT band tightness, strength, and balance deficits to improve independence with functional mobility.      PT Frequency  2x / week    PT Duration  8 weeks    PT Treatment/Interventions  ADLs/Self Care Home Management;Biofeedback;Electrical Stimulation;Therapeutic exercise;Therapeutic activities;Functional mobility training;Stair training;Orthotic Fit/Training;Gait training;Patient/family education;DME Instruction;Neuromuscular re-education;Balance training;Vestibular;Dry needling    PT Next Visit Plan  Cane with emphasis on mechanics (dec'ing L toe out), cane on community surfaces, eccentric quads for stair negotiation, floor transfers.   Dynamic gait and higher level Balance. balance without her cane over hurdles, cone weaving and compliant surfaces. Stepping/hip strategies     Consulted and Agree with Plan of Care  Patient    Family Member Consulted  --       Patient will benefit from skilled therapeutic intervention in order to improve the following deficits and impairments:  Abnormal gait, Decreased endurance, Impaired sensation, Decreased knowledge of use of DME, Decreased strength, Decreased balance, Decreased mobility, Decreased range of motion, Decreased coordination  Visit Diagnosis: Muscle weakness (generalized)  Other disturbances of skin sensation  Other lack of coordination     Problem List Patient Active Problem List   Diagnosis Date Noted  . Anxiety and depression 05/30/2018  . E. coli UTI 04/27/2018  . Acute blood loss anemia   . Acute deep vein thrombosis (DVT) of right femoral vein ([place])   . Hyponatremia   . Hypokalemia   . Hypomagnesemia   . Ileus, postoperative ([place])   . Thoracic myelopathy 03/21/2018  . Neurogenic bowel 03/21/2018  . Neurogenic bladder 03/21/2018  . Left wrist fracture, sequela 03/21/2018  . Numbness 02/15/2018  . Ataxic gait 02/15/2018  . Left leg weakness 02/15/2018  . Other fatigue 02/15/2018  . Urinary incontinence 02/15/2018  . Bowel incontinence 02/15/2018    Floreen Comber, SPT 08/16/2018, 12:30 PM  [facility] [address] [place], [place], [address] Phone: [phone]  Fax:  [phone]  Name: Hannah Jefferson MRN: [id number] Date of Birth: Jun 20, 1956

## 2018-08-16 NOTE — Therapy (Signed)
[facility] [address] [place], [place], [address] Phone: [phone]   Fax:  [phone]  Occupational Therapy Treatment  Patient Details  Name: Hannah Jefferson MRN: [id number] Date of Birth: 1956/09/08 No data recorded  Encounter Date: 08/16/2018  OT End of Session - 08/16/18 1825    Visit Number  14    Number of Visits  17    Date for OT Re-Evaluation  09/03/18    Authorization Type  UHC 20 visit limit OT    Authorization - Visit Number  14    Authorization - Number of Visits  20    OT Start Time  9242    OT Stop Time  1503    OT Time Calculation (min)  46 min    Activity Tolerance  Patient tolerated treatment well       Past Medical History:  Diagnosis Date  . Chronic left hip pain   . GERD (gastroesophageal reflux disease)   . Gestational diabetes   . Hearing loss of both ears   . Hypertension   . Neuropathy    numbness/tingling from waist down  . Vision abnormalities     Past Surgical History:  Procedure Laterality Date  . IR IVC FILTER PLMT / S&I /IMG GUID/MOD SED  04/05/2018  . LAPAROSCOPIC CHOLECYSTECTOMY  1992  . REFRACTIVE SURGERY Bilateral 2004    There were no vitals filed for this visit.  Subjective Assessment - 08/16/18 1823    Subjective   I am more aware of not hiking my shoulders when I am walking    Patient is accompained by:  --   friend   Pertinent History  surgery to remove tumor on spine 03/13/18    Limitations  none    Currently in Pain?  No/denies        Treatment: Pt seen for aquatic therapy today.  Treatment occurred in water 2.5 - 4 feet deep depending upon activity.  Pt entered the pool using two railings with focus on decreasing reliance on UE's and improving postural alignment and control. Focused on RLE lowering pt with LLE first to descend vs lowering primarily with UE's.  Treatment also focused on postural alignment and control for functional ambulation using dumb  bells for light UE support with vc's and tactile cues for pt to avoid shoulder hiking and "holding " through head and UE's.  Also addressed alignment relative to COM and BOS in static standing, wide tandem stance and functional ambulation. Addressed weight shift from one LE to the other while in wide tandem stance with emphasis on alignment and control .  Addressed LE strengthening (hip extension, ab/adduction, hip flexion, knee flexion/extension) as well as core strength and dynamic standing balance.  Pt beginning to be able to self regulate for postural control and alignment as evidenced by decreasing need for vc's and tactile cues. Pt exited the pool via steps using only 1 hand rail and focusing on using RLE as "power leg" to complete step - pt needed mod facilitation and cue.  Pt tolerated well and demonstrated improved postural alignment and control on pool deck with functional ambulation.                      OT Short Term Goals - 08/16/18 1823      OT SHORT TERM GOAL #1   Title  Patient will complete a home exercise program designed to improve grip strength - due 08/04/18  Status  Achieved      OT SHORT TERM GOAL #2   Title  Patient wil stand in shower from tub transfer bench in shower to wash periarea with supervision    Status  On-going      OT SHORT TERM GOAL #3   Title  Patient will demonstrate understanding of graduated driving program, and/or driving resources to prepare for return to driving    Status  Achieved      OT [place] #4   Title  Patient will don socks and shoes, and fasten/tie shoes with modified independence    Status  Achieved        OT Long Term Goals - 08/16/18 1824      OT LONG TERM GOAL #1   Title  Patient will demonstrate sufficient activity tolerance and functional mobility to return work 4-6 hours per day following daily self care skills. - 09/03/2018    Time  8    Period  Weeks    Status  On-going      OT LONG TERM GOAL #2    Title  Patient will prepare a hot meal consisting of at least two items with no more than intermittent min assist    Time  8    Period  Weeks    Status  Achieved      OT LONG TERM GOAL #3   Title  Patient will complete simple housekeeping and laundry from ambulatory level without physical assistance    Time  8    Period  Weeks    Status  Achieved      OT LONG TERM GOAL #4   Title  Patient will demonstrate improved dynamic stand balance to stand without UE support for greater than 10 min while completing familiar functional task.      Time  8    Period  Weeks    Status  On-going            Plan - 08/16/18 1824    Clinical Impression Statement  Pt progressing toward goals. 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 with excellent extended family support    Occupational performance deficits (Please refer to evaluation for details):  ADL's;IADL's;Work;Leisure    Rehab Potential  Excellent    OT Frequency  2x / week    OT Duration  8 weeks    OT Treatment/Interventions  Self-care/ADL training;Therapeutic exercise;Aquatic Therapy;Balance training;Therapeutic activities;Functional Mobility Training;DME and/or AE instruction;Energy conservation;Patient/family education;Neuromuscular education;Stair Training    Plan  Aquatic therapy next session followed by land session to work on functional mobility, stand tolerance, decrease UE reliance in standing    Consulted and Agree with Plan of Care  Patient       Patient will benefit from skilled therapeutic intervention in order to improve the following deficits and impairments:  Decreased knowledge of use of DME, Pain, Decreased mobility, Impaired sensation, Improper body mechanics, Decreased activity tolerance, Decreased endurance, Decreased strength,  Increased muscle spasms, Improper spinal/pelvic alignment, Decreased balance, Difficulty walking  Visit Diagnosis: Muscle weakness (generalized)  Other disturbances of skin sensation  Other lack of coordination  Unsteadiness on feet    Problem List Patient Active Problem List   Diagnosis Date Noted  . Anxiety and depression 05/30/2018  . E. coli UTI 04/27/2018  . Acute blood loss  anemia   . Acute deep vein thrombosis (DVT) of right femoral vein ([place])   . Hyponatremia   . Hypokalemia   . Hypomagnesemia   . Ileus, postoperative ([place])   . Thoracic myelopathy 03/21/2018  . Neurogenic bowel 03/21/2018  . Neurogenic bladder 03/21/2018  . Left wrist fracture, sequela 03/21/2018  . Numbness 02/15/2018  . Ataxic gait 02/15/2018  . Left leg weakness 02/15/2018  . Other fatigue 02/15/2018  . Urinary incontinence 02/15/2018  . Bowel incontinence 02/15/2018    Quay Burow , OTR/L 08/16/2018, 9:16 PM  [facility] [address] [place], [place], [address] Phone: [phone]   Fax:  [phone]  Name: BERNADETT MILIAN MRN: [id number] Date of Birth: Nov 23, 1955

## 2018-08-16 NOTE — Patient Instructions (Addendum)
Trigger Point Dry Needling  . What is Trigger Point Dry Needling (DN)? o DN is a physical therapy technique used to treat muscle pain and dysfunction. Specifically, DN helps deactivate muscle trigger points (muscle knots).  o A thin filiform needle is used to penetrate the skin and stimulate the underlying trigger point. The goal is for a local twitch response (LTR) to occur and for the trigger point to relax. No medication of any kind is injected during the procedure.   . What Does Trigger Point Dry Needling Feel Like?  o The procedure feels different for each individual patient. Some patients report that they do not actually feel the needle enter the skin and overall the process is not painful. Very mild bleeding may occur. However, many patients feel a deep cramping in the muscle in which the needle was inserted. This is the local twitch response.   Marland Kitchen How Will I feel after the treatment? o Soreness is normal, and the onset of soreness may not occur for a few hours. Typically this soreness does not last longer than two days.  o Bruising is uncommon, however; ice can be used to decrease any possible bruising.  o In rare cases feeling tired or nauseous after the treatment is normal. In addition, your symptoms may get worse before they get better, this period will typically not last longer than 24 hours.   . What Can I do After My Treatment? o Increase your hydration by drinking more water for the next 24 hours. o You may place ice or heat on the areas treated that have become sore, however, do not use heat on inflamed or bruised areas. Heat often brings more relief post needling. o You can continue your regular activities, but vigorous activity is not recommended initially after the treatment for 24 hours. o DN is best combined with other physical therapy such as strengthening, stretching, and other therapies.   Outer Hip Stretch: Reclined IT Band Stretch (Strap)    Strap around opposite  foot, pull across only as far as possible with shoulders on mat. Hold for __30-60__ seconds. Repeat _3___ times on left leg.   Copyright  VHI. All rights reserved.

## 2018-08-21 ENCOUNTER — Encounter: Payer: Self-pay | Admitting: Rehabilitation

## 2018-08-21 ENCOUNTER — Ambulatory Visit: Payer: 59 | Admitting: Occupational Therapy

## 2018-08-21 ENCOUNTER — Encounter: Payer: Self-pay | Admitting: Occupational Therapy

## 2018-08-21 ENCOUNTER — Ambulatory Visit: Payer: 59 | Admitting: Rehabilitation

## 2018-08-21 DIAGNOSIS — M6281 Muscle weakness (generalized): Secondary | ICD-10-CM

## 2018-08-21 DIAGNOSIS — R2689 Other abnormalities of gait and mobility: Secondary | ICD-10-CM

## 2018-08-21 DIAGNOSIS — R278 Other lack of coordination: Secondary | ICD-10-CM

## 2018-08-21 DIAGNOSIS — R2681 Unsteadiness on feet: Secondary | ICD-10-CM

## 2018-08-21 DIAGNOSIS — R208 Other disturbances of skin sensation: Secondary | ICD-10-CM

## 2018-08-21 NOTE — Therapy (Signed)
[facility] [address] [place], [place], [address] Phone: [phone]   Fax:  [phone]  Occupational Therapy Treatment  Patient Details  Name: Hannah Jefferson MRN: [id number] Date of Birth: [birth year] No data recorded  Encounter Date: 08/21/2018  OT End of Session - 08/21/18 1232    Visit Number  78   arrived no charge today   Number of Visits  17    Date for OT Re-Evaluation  09/03/18    Authorization Type  [employer] 20 visit limit OT    Authorization - Visit Number  23   arrived no charge today   Authorization - Number of Visits  20    OT Start Time  1150    OT Stop Time  1225    OT Time Calculation (min)  35 min    Activity Tolerance  Patient tolerated treatment well       Past Medical History:  Diagnosis Date  . Chronic left hip pain   . GERD (gastroesophageal reflux disease)   . Gestational diabetes   . Hearing loss of both ears   . Hypertension   . Neuropathy    numbness/tingling from waist down  . Vision abnormalities     Past Surgical History:  Procedure Laterality Date  . IR IVC FILTER PLMT / S&I /IMG GUID/MOD SED  04/05/2018  . LAPAROSCOPIC CHOLECYSTECTOMY  1992  . REFRACTIVE SURGERY Bilateral 2004    There were no vitals filed for this visit.  Subjective Assessment - 08/21/18 1151    Subjective   My hip and back pain are better - I think its because of what I am doing in the pool     Pertinent History  surgery to remove tumor on spine 03/13/18    Limitations  none    Currently in Pain?  No/denies                   OT Treatments/Exercises (OP) - 08/21/18 1227      ADLs   ADL Comments  Pt has 6 visits for OT remaining for this calendar year.  Had discussion with pt regarding how she wants to use her remainng visits. Pt wishes to decrease OT frequency to 1x/wk for 6 weeks. Pt also wishes to use remainng visits for OT for aquatic therapy only.  Schedule adjusted and will see pt  1x this week for OT.                 OT Short Term Goals - 08/21/18 1230      OT SHORT TERM GOAL #1   Title  Patient will complete a home exercise program designed to improve grip strength - due 08/04/18    Status  Achieved      OT SHORT TERM GOAL #2   Title  Patient wil stand in shower from tub transfer bench in shower to wash periarea with supervision    Status  Achieved      OT [place] #3   Title  Patient will demonstrate understanding of graduated driving program, and/or driving resources to prepare for return to driving    Status  Achieved      OT [place] #4   Title  Patient will don socks and shoes, and fasten/tie shoes with modified independence    Status  Achieved        OT Long Term Goals - 08/21/18 1231      OT LONG TERM  GOAL #1   Title  Patient will demonstrate sufficient activity tolerance and functional mobility to return work 4-6 hours per day following daily self care skills. - 09/03/2018    Time  8    Period  Weeks    Status  On-going      OT LONG TERM GOAL #2   Title  Patient will prepare a hot meal consisting of at least two items with no more than intermittent min assist    Time  8    Period  Weeks    Status  Achieved      OT LONG TERM GOAL #3   Title  Patient will complete simple housekeeping and laundry from ambulatory level without physical assistance    Time  8    Period  Weeks    Status  Achieved      OT LONG TERM GOAL #4   Title  Patient will demonstrate improved dynamic stand balance to stand without UE support for greater than 10 min while completing familiar functional task.      Time  8    Period  Weeks    Status  On-going            Plan - 08/21/18 1231    Clinical Impression Statement  Pt progressing toward goals. Pt wishes to decrease frequency to 1x/wk for remaining 6 weeks.     Occupational Profile and client history currently impacting functional performance  Patient is married, worked full time as  Glass blower/designer, very active in her church with excellent extended family support    Occupational performance deficits (Please refer to evaluation for details):  ADL's;IADL's;Work;Leisure    Rehab Potential  Excellent    OT Frequency  2x / week    OT Duration  8 weeks    OT Treatment/Interventions  Self-care/ADL training;Therapeutic exercise;Aquatic Therapy;Balance training;Therapeutic activities;Functional Mobility Training;DME and/or AE instruction;Energy conservation;Patient/family education;Neuromuscular education;Stair Training    Plan  Aquatic therapy to address functional mobility, standing /activity  tolerance, decrease UE reliance in standing, postural alignment and control     Consulted and Agree with Plan of Care  Patient       Patient will benefit from skilled therapeutic intervention in order to improve the following deficits and impairments:  Decreased knowledge of use of DME, Pain, Decreased mobility, Impaired sensation, Improper body mechanics, Decreased activity tolerance, Decreased endurance, Decreased strength, Increased muscle spasms, Improper spinal/pelvic alignment, Decreased balance, Difficulty walking  Visit Diagnosis: Muscle weakness (generalized)  Other lack of coordination  Other disturbances of skin sensation  Unsteadiness on feet    Problem List Patient Active Problem List   Diagnosis Date Noted  . Anxiety and depression 05/30/2018  . E. coli UTI 04/27/2018  . Acute blood loss anemia   . Acute deep vein thrombosis (DVT) of right femoral vein ([place])   . Hyponatremia   . Hypokalemia   . Hypomagnesemia   . Ileus, postoperative ([place])   . Thoracic myelopathy 03/21/2018  . Neurogenic bowel 03/21/2018  . Neurogenic bladder 03/21/2018  . Left wrist fracture, sequela 03/21/2018  . Numbness 02/15/2018  . Ataxic gait 02/15/2018  . Left leg weakness 02/15/2018  . Other fatigue 02/15/2018  . Urinary incontinence 02/15/2018  . Bowel incontinence 02/15/2018     Quay Burow, OTR/L 08/21/2018, 12:34 PM  [facility] [address] [place], [place], [address] Phone: [phone]   Fax:  [phone]  Name: Hannah Jefferson MRN: [id number] Date of Birth:  02/14/1956 

## 2018-08-21 NOTE — Therapy (Signed)
[facility] [address] [place], [place], [address] Phone: [phone]   Fax:  [phone]  Physical Therapy Treatment  Patient Details  Name: Hannah Jefferson MRN: [id number] Date of Birth: 01/20/1956 Referring Provider (PT): Dr Naaman Plummer   Encounter Date: 08/21/2018  PT End of Session - 08/21/18 1201    Visit Number  13    Number of Visits  17    Date for PT Re-Evaluation  09/03/18    Authorization Type  U[employer]: 20 visits PT and 20 visits OT    Authorization - Visit Number  13    Authorization - Number of Visits  20    PT Start Time  1100    PT Stop Time  1145    PT Time Calculation (min)  45 min    Activity Tolerance  Patient tolerated treatment well    Behavior During Therapy  [facility] for tasks assessed/performed       Past Medical History:  Diagnosis Date  . Chronic left hip pain   . GERD (gastroesophageal reflux disease)   . Gestational diabetes   . Hearing loss of both ears   . Hypertension   . Neuropathy    numbness/tingling from waist down  . Vision abnormalities     Past Surgical History:  Procedure Laterality Date  . IR IVC FILTER PLMT / S&I /IMG GUID/MOD SED  04/05/2018  . LAPAROSCOPIC CHOLECYSTECTOMY  1992  . REFRACTIVE SURGERY Bilateral 2004    There were no vitals filed for this visit.  Subjective Assessment - 08/21/18 1109    Subjective  Pt reports she performed all ADL's this morning using her cane with no issues and continues to enjoy aquatic therapy. Reports IT band is better; however, continues to tighten up after a long day of walking.     Patient is accompained by:  Family member    Pertinent History  B DVTs s/p 4/20019 surgery with IVC filter in place, hx of gestation DM only, HTN, Chronic L hip pain, scoliosis, hiatal hernia    Patient Stated Goals  I want to get back as much as possible.     Currently in Pain?  No/denies                       [facility] Adult PT  Treatment/Exercise - 08/21/18 1150      Transfers   Transfers  Sit to Stand;Stand to [employer] Transfers    Sit to Stand  6: Modified independent (Device/Increase time)    Stand to Sit  6: Modified independent (Device/Increase time)    Stand Pivot Transfers  6: Modified independent (Device/Increase time)    Comments  Pt demonstrates ability to perform STS from standard height chair with no UE assist s/p multiple attempts and verbal cues to maintain trunk flexion for efficiency      Ambulation/Gait   Curb  4: Min assist    Curb Details (indicate cue type and reason)  Therapist assesses patient recall of proper technique with curb negotiation. Pt requires min A for recall and demonstrates ability to demonstrate good carry over with proper cueing. Pt performs x1 trial with therapist providing cueing for technique and 2nd trial with no cueing demonstrating good carry over.        High Level Balance   High Level Balance Activities  Negotiating over obstacles    High Level Balance Comments  Pt performs x2 trials of stepping over hurdles of different  heights using her cane. Pt demonstrates good carry over of ascending obstacles with her cane. Pt requires CGA for safety considerations.       Therapeutic Activites    Therapeutic Activities  Other Therapeutic Activities    Other Therapeutic Activities  Pt performs mat> floor transfer safely with supervision demonstrating good recall of sequencing with safety considerations. Pt performs floor> mat transfer with therapist providing verbal cues for proper technique and energy conservation. Pt able to perform initial trial with min A for lateral boost. Pt then attempts to perform second trial and able to descend to floor safely with supervision; however, requires +2 assistance to return to mat 2/2 fatigue onset.       Exercises   Exercises  Knee/Hip    Other Exercises   Quadriped exercises include; hip abduction & straight leg hip extension for 2x10  reps each. Pt demonstrates increased difficulty performing strengthening exercises on L> R side 2/2 weakness. Therapist provides exercise modifications to L LE to reduce pelvic instability and improve glut med activation with dec compensatory movements. Pt then performs tall kneeling ball roll outs to increase core activation and pelvic stability for 2x10 reps. Therapist provides verbal cues and tactile cues to promote proper posture and adequate technique.              PT Education - 08/21/18 1108    Education Details  Therapist provided education regarding decreasing frequency of visits per week, safety considerations with fall recovery and energy conservation, and curb negotiation with her cane utilizing proper technique.     Person(s) Educated  Patient    Methods  Explanation    Comprehension  Verbalized understanding       PT Short Term Goals - 08/03/18 1513      PT SHORT TERM GOAL #1   Title  Pt will be IND in HEP to improve deficits listed above. TARGET DATE FOR ALL STGS: 08/02/18    Baseline  Pt reports ability to perform HEP independently- achieved on 9/19    Status  Achieved      PT SHORT TERM GOAL #2   Title  Pt will improve TUG time with LRAD to </=13.5sec. to decr. falls risk.     Baseline  TUG score improved to 12.97 seconds using RW- Achieved on 9/19     Status  Achieved      PT SHORT TERM GOAL #3   Title  Pt will improve gait speed to >/=2.12ft/sec, with LRAD to safely amb. in the community.     Baseline  Gait speed improved to 3.21 ft/sec using RW- Achieved on 9/19     Status  Achieved      PT SHORT TERM GOAL #4   Title  Pt will amb. 500' over even terrain, with LRAD at MOD I Level to improve safety during functional mobility.     Baseline  Pt demonstrates ability to ambulate over even terrain Mod I using RW for 500 ft- Achieved on 9/19    Status  Achieved      PT SHORT TERM GOAL #5   Title  Perform BERG and write STG and LTG    Baseline  30/56    Status   Achieved      PT SHORT TERM GOAL #6   Title  Pt will improve BERG balance test to 34/56 in order to indicate decreased fall risk.     Time  4    Period  Weeks    Status  Achieved   Berg Score 43/56- goal achieved on 9/19       PT Long Term Goals - 08/06/18 1541      PT LONG TERM GOAL #1   Title  Pt will amb. 1000' over even/paved terrain with LRAD at MOD I level to improve functional mobility. TARGET DATE FOR ALL LTGS: 08/30/18    Status  New      PT LONG TERM GOAL #2   Title  Trial amb. without AD when appropriate and write goal as indicated.     Status  New      PT LONG TERM GOAL #3   Title  Pt will report no falls in the last 4 weeks to improve safety.     Status  New      PT LONG TERM GOAL #4   Title  Pt will improve BERG balance score to >/=50/56 in order to indicate decreased fall risk.     Baseline  43/56    Time  8    Period  Weeks    Status  Revised      PT LONG TERM GOAL #5   Title  Pt will perform floor recovery tranfser at S level in order to indicate safety in case of fall but also to ensure safety when playing in floor with family.     Time  8    Period  Weeks    Status  New            Plan - 08/21/18 1201    Clinical Impression Statement  Today's skilled session focused on safety considerations with fall recovery, LE strengthening, and curb negotiation with her cane. Pt demonstrates good recall of how to safely perform mat> floor transfers; however, required multimodal cueing for proper technique with special consideration to energy conservation for floor> mat recovery with therapist providing min A for boosting. Pt attempted to perform 2nd attempt of mat<>floor for increased practice using proper technique; however, required +2 assist to achieve floor> mat 2/2 fatigue onset. Pt will continue to benefit from functional mobility training with her cane to ensure safety and proper technique when ambulating in the community. Pt will continue to benefit from  therapy to address balance, strength, and functional limitations.     PT Frequency  2x / week    PT Duration  8 weeks    PT Treatment/Interventions  ADLs/Self Care Home Management;Biofeedback;Electrical Stimulation;Therapeutic exercise;Therapeutic activities;Functional mobility training;Stair training;Orthotic Fit/Training;Gait training;Patient/family education;DME Instruction;Neuromuscular re-education;Balance training;Vestibular;Dry needling    PT Next Visit Plan  hurdles on compliant surface, cone tapping with side step for glut med, recall of curb sequencing, Cane with emphasis on mechanics (dec'ing L toe out), cane on community surfaces, eccentric quads for stair negotiation, floor transfers.  Dynamic gait and higher level Balance. balance without her cane over hurdles, cone weaving and compliant surfaces. Stepping/hip strategies     Consulted and Agree with Plan of Care  Patient       Patient will benefit from skilled therapeutic intervention in order to improve the following deficits and impairments:  Abnormal gait, Decreased endurance, Impaired sensation, Decreased knowledge of use of DME, Decreased strength, Decreased balance, Decreased mobility, Decreased range of motion, Decreased coordination  Visit Diagnosis: Muscle weakness (generalized)  Other lack of coordination     Problem List Patient Active Problem List   Diagnosis Date Noted  . Anxiety and depression 05/30/2018  . E. coli UTI 04/27/2018  . Acute blood loss anemia   . Acute  deep vein thrombosis (DVT) of right femoral vein ([place])   . Hyponatremia   . Hypokalemia   . Hypomagnesemia   . Ileus, postoperative ([place])   . Thoracic myelopathy 03/21/2018  . Neurogenic bowel 03/21/2018  . Neurogenic bladder 03/21/2018  . Left wrist fracture, sequela 03/21/2018  . Numbness 02/15/2018  . Ataxic gait 02/15/2018  . Left leg weakness 02/15/2018  . Other fatigue 02/15/2018  . Urinary incontinence 02/15/2018  . Bowel  incontinence 02/15/2018    Floreen Comber, SPT 08/21/2018, 12:06 PM  [facility] [address] [place], [place], [address] Phone: [phone]   Fax:  [phone]  Name: Hannah Jefferson MRN: [id number] Date of Birth: 1956-06-20

## 2018-08-23 ENCOUNTER — Ambulatory Visit: Payer: 59 | Admitting: Physical Therapy

## 2018-08-23 ENCOUNTER — Encounter: Payer: Self-pay | Admitting: Physical Therapy

## 2018-08-23 ENCOUNTER — Encounter: Payer: Self-pay | Admitting: Occupational Therapy

## 2018-08-23 ENCOUNTER — Ambulatory Visit: Payer: 59 | Admitting: Occupational Therapy

## 2018-08-23 ENCOUNTER — Encounter: Payer: 59 | Admitting: Occupational Therapy

## 2018-08-23 DIAGNOSIS — M6281 Muscle weakness (generalized): Secondary | ICD-10-CM

## 2018-08-23 DIAGNOSIS — R208 Other disturbances of skin sensation: Secondary | ICD-10-CM

## 2018-08-23 DIAGNOSIS — R278 Other lack of coordination: Secondary | ICD-10-CM

## 2018-08-23 DIAGNOSIS — R2681 Unsteadiness on feet: Secondary | ICD-10-CM

## 2018-08-23 NOTE — Therapy (Signed)
[facility] [address] [place], [place], [address] Phone: [phone]   Fax:  [phone]  Physical Therapy Treatment  Patient Details  Name: Hannah Jefferson MRN: [id number] Date of Birth: April 19, 1956 Referring Provider (PT): Dr Naaman Plummer   Encounter Date: 08/23/2018  PT End of Session - 08/23/18 1245    Visit Number  14    Number of Visits  17    Date for PT Re-Evaluation  09/03/18    Authorization Type  [employer]: 20 visits PT and 20 visits OT    Authorization - Visit Number  14    Authorization - Number of Visits  20    PT Start Time  1100    PT Stop Time  1145    PT Time Calculation (min)  45 min    Equipment Utilized During Treatment  Gait belt    Activity Tolerance  Patient tolerated treatment well    Behavior During Therapy  WFL for tasks assessed/performed       Past Medical History:  Diagnosis Date  . Chronic left hip pain   . GERD (gastroesophageal reflux disease)   . Gestational diabetes   . Hearing loss of both ears   . Hypertension   . Neuropathy    numbness/tingling from waist down  . Vision abnormalities     Past Surgical History:  Procedure Laterality Date  . IR IVC FILTER PLMT / S&I /IMG GUID/MOD SED  04/05/2018  . LAPAROSCOPIC CHOLECYSTECTOMY  1992  . REFRACTIVE SURGERY Bilateral 2004    There were no vitals filed for this visit.  Subjective Assessment - 08/23/18 1105    Subjective  Doing well, still mainly using the cane.  Only uses the RW when she gets up in the middle of the night to use the bathroom.  Has also been walking a little while holding the cane off the ground!  Having a little soreness in the ribs but thinks it is due to the core workout.    Patient is accompained by:  Family member    Pertinent History  B DVTs s/p 4/20019 surgery with IVC filter in place, hx of gestation DM only, HTN, Chronic L hip pain, scoliosis, hiatal hernia    Patient Stated Goals  I want to get back as  much as possible.     Currently in Pain?  No/denies                       [facility] Adult PT Treatment/Exercise - 08/23/18 1112      Transfers   Transfers  Sit to Stand;Stand to Sit;Stand Pivot Transfers    Sit to Stand  6: Modified independent (Device/Increase time)    Stand to Sit  6: Modified independent (Device/Increase time)    Stand Pivot Transfers  6: Modified independent (Device/Increase time)      Ambulation/Gait   Ambulation/Gait  Yes    Ambulation/Gait Assistance  5: Supervision;4: Min guard    Ambulation/Gait Assistance Details  Pt performs ambulation on level surface with no AD and therapist providing supervision to min guard. Therapist provides VC for reicprocal arm swing to optimize gait sequencing with patient demonstrating good carry over . Pt demonstrates ability to perform ambulation with head turns in all directions but does demonstrate veering with inability to self correct without cues.     Ambulation Distance (Feet)  200 Feet    Assistive device  None    Gait Pattern  Step-through pattern;Decreased  dorsiflexion - right;Decreased dorsiflexion - left;Decreased stride length;Wide base of support    Ambulation Surface  Level;Indoor      Exercises   Exercises  Knee/Hip      Knee/Hip Exercises: Seated   Other Seated Knee/Hip Exercises  R lateral weight shifting on Swiss ball x 10 reps focus on R trunk elongation, L trunk shortening    Other Seated Knee/Hip Exercises  Pt performs 1x10 reps of UE diagonals with 3# wt while sitting on swiss ball for increased core activation during postural stability exericse. Pt performs 1 set  of 10 reps using each UE.     Marching  Right;Left;2 sets;10 reps   on Swiss ball; added contralat UE over head reach   Sit to Sand  2 sets;10 reps;without UE support   from Swiss ball with EO, EC, narrow BOS            PT Education - 08/23/18 1244    Education Details  Therapist provided education regarding POC moving  forward with continuing to practice ambulation on level surfaces with no AD and to continue performing HEP exercises at home.    Person(s) Educated  Patient;Child(ren)    Methods  Explanation    Comprehension  Verbalized understanding       PT Short Term Goals - 08/03/18 1513      PT SHORT TERM GOAL #1   Title  Pt will be IND in HEP to improve deficits listed above. TARGET DATE FOR ALL STGS: 08/02/18    Baseline  Pt reports ability to perform HEP independently- achieved on 9/19    Status  Achieved      PT SHORT TERM GOAL #2   Title  Pt will improve TUG time with LRAD to </=13.5sec. to decr. falls risk.     Baseline  TUG score improved to 12.97 seconds using RW- Achieved on 9/19     Status  Achieved      PT SHORT TERM GOAL #3   Title  Pt will improve gait speed to >/=2.81ft/sec, with LRAD to safely amb. in the community.     Baseline  Gait speed improved to 3.21 ft/sec using RW- Achieved on 9/19     Status  Achieved      PT SHORT TERM GOAL #4   Title  Pt will amb. 500' over even terrain, with LRAD at MOD I Level to improve safety during functional mobility.     Baseline  Pt demonstrates ability to ambulate over even terrain Mod I using RW for 500 ft- Achieved on 9/19    Status  Achieved      PT SHORT TERM GOAL #5   Title  Perform BERG and write STG and LTG    Baseline  30/56    Status  Achieved      PT SHORT TERM GOAL #6   Title  Pt will improve BERG balance test to 34/56 in order to indicate decreased fall risk.     Time  4    Period  Weeks    Status  Achieved   Berg Score 43/56- goal achieved on 9/19       PT Long Term Goals - 08/06/18 1541      PT LONG TERM GOAL #1   Title  Pt will amb. 1000' over even/paved terrain with LRAD at MOD I level to improve functional mobility. TARGET DATE FOR ALL LTGS: 08/30/18    Status  New      PT LONG TERM  GOAL #2   Title  Trial amb. without AD when appropriate and write goal as indicated.     Status  New      PT LONG TERM GOAL  #3   Title  Pt will report no falls in the last 4 weeks to improve safety.     Status  New      PT LONG TERM GOAL #4   Title  Pt will improve BERG balance score to >/=50/56 in order to indicate decreased fall risk.     Baseline  43/56    Time  8    Period  Weeks    Status  Revised      PT LONG TERM GOAL #5   Title  Pt will perform floor recovery tranfser at S level in order to indicate safety in case of fall but also to ensure safety when playing in floor with family.     Time  8    Period  Weeks    Status  New            Plan - 08/23/18 1245    Clinical Impression Statement  Today's skilled session focused on implementation of functional mobility tasks while sitting on large ball to increase challenge with postural stability. Pt tolerated sesssion well demonstrating ability to reach and perform UE movements with a weighted ball to challenge her core and pelvic control to prevent LOB. Pt demonstrates improvement during ambulation trial with no AD with ability to perform head turns to elicit internal pertubations with slight veering towards her left. Pt will continue to benefit from gait training with increased challenge to improve safety with functional mobility.     PT Frequency  2x / week    PT Duration  8 weeks    PT Treatment/Interventions  ADLs/Self Care Home Management;Biofeedback;Electrical Stimulation;Therapeutic exercise;Therapeutic activities;Functional mobility training;Stair training;Orthotic Fit/Training;Gait training;Patient/family education;DME Instruction;Neuromuscular re-education;Balance training;Vestibular;Dry needling    PT Next Visit Plan  (plan to recert next week for 5-6 more weeks at 1x/wk) strengthening to her HEP, and balance ,hurdles on compliant surface, cone tapping with side step for glut med, recall of curb sequencing, Cane with emphasis on mechanics (dec'ing L toe out), cane on community surfaces, eccentric quads for stair negotiation, floor transfers.   Dynamic gait and higher level Balance. balance without her cane over hurdles, cone weaving and compliant surfaces. Stepping/hip strategies     Consulted and Agree with Plan of Care  Patient;Family member/caregiver    Family Member Consulted  son       Patient will benefit from skilled therapeutic intervention in order to improve the following deficits and impairments:  Abnormal gait, Decreased endurance, Impaired sensation, Decreased knowledge of use of DME, Decreased strength, Decreased balance, Decreased mobility, Decreased range of motion, Decreased coordination  Visit Diagnosis: Muscle weakness (generalized)  Other lack of coordination     Problem List Patient Active Problem List   Diagnosis Date Noted  . Anxiety and depression 05/30/2018  . E. coli UTI 04/27/2018  . Acute blood loss anemia   . Acute deep vein thrombosis (DVT) of right femoral vein ([place])   . Hyponatremia   . Hypokalemia   . Hypomagnesemia   . Ileus, postoperative ([place])   . Thoracic myelopathy 03/21/2018  . Neurogenic bowel 03/21/2018  . Neurogenic bladder 03/21/2018  . Left wrist fracture, sequela 03/21/2018  . Numbness 02/15/2018  . Ataxic gait 02/15/2018  . Left leg weakness 02/15/2018  . Other fatigue 02/15/2018  . Urinary incontinence  02/15/2018  . Bowel incontinence 02/15/2018    Floreen Comber, SPT 08/23/2018, 12:49 PM  [facility] [address] [place], [place], [address] Phone: [phone]   Fax:  [phone]  Name: SHERLENE RICKEL MRN: [id number] Date of Birth: Jan 19, 1956

## 2018-08-23 NOTE — Therapy (Signed)
[facility] [address] [place], [place], [address] Phone: [phone]   Fax:  [phone]  Occupational Therapy Treatment  Patient Details  Name: Hannah Jefferson MRN: [id number] Date of Birth: 1956-11-14 No data recorded  Encounter Date: 08/23/2018  OT End of Session - 08/23/18 2144    Visit Number  15    Number of Visits  17    Date for OT Re-Evaluation  09/03/18    Authorization Type  [employer] 20 visit limit OT    Authorization - Visit Number  15    Authorization - Number of Visits  20    OT Start Time  1417    OT Stop Time  1500    OT Time Calculation (min)  43 min    Activity Tolerance  Patient tolerated treatment well       Past Medical History:  Diagnosis Date  . Chronic left hip pain   . GERD (gastroesophageal reflux disease)   . Gestational diabetes   . Hearing loss of both ears   . Hypertension   . Neuropathy    numbness/tingling from waist down  . Vision abnormalities     Past Surgical History:  Procedure Laterality Date  . IR IVC FILTER PLMT / S&I /IMG GUID/MOD SED  04/05/2018  . LAPAROSCOPIC CHOLECYSTECTOMY  1992  . REFRACTIVE SURGERY Bilateral 2004    There were no vitals filed for this visit.  Subjective Assessment - 08/23/18 2142    Subjective   I have been practicing walking the way we did in the pool and today during PT I walked without my cane.     Pertinent History  surgery to remove tumor on spine 03/13/18    Limitations  none    Currently in Pain?  No/denies         Treatment:  Pt seen for aquatic therapy today.  Treatment took place in water 2.5-4 feet deep depending upon activity.  Pt entered the pool via steps today.  Pt used 2 railings however demonstrated decreased reliance on UE's with cues and demonstration.  Pt able to maintain more thoracic extension and increase use of LE"s to complete steps.  Treatment focused on decreasing "holding" through shoulders and head during  functional ambulation as well as increasing normal arm swing and facilitating more upper trunk rotation during functional ambulation.  Treatment also focused on increasing speed and automatic movement patterns to decrease fall risk on land.  Started in deeper water to allow for more support and gradually moved to more shallow water to increase demand on postural control and dynamic balance.  Also addressed LE strengthening while challenging dynamic standing balance (only 1 UE support), with focus on postural alignment. Pt with improved ability to self monitor shoulder hiking and forward head during functional mobility.  Addressed core strength and balance with focus on holding and maintaining shoulder depression with all activities. LE strengthening (hip flexion/exension as well as ab/adduction) in deeper water with faster movements to increase resistance.  Pt exited the pool via steps using 1 hand rail with intermittent facilitation for RLE to power up while ascending steps.                    OT Short Term Goals - 08/23/18 2143      OT SHORT TERM GOAL #1   Title  Patient will complete a home exercise program designed to improve grip strength - due 08/04/18    Status  Achieved  OT SHORT TERM GOAL #2   Title  Patient wil stand in shower from tub transfer bench in shower to wash periarea with supervision    Status  Achieved      OT [place] #3   Title  Patient will demonstrate understanding of graduated driving program, and/or driving resources to prepare for return to driving    Status  Achieved      OT [place] #4   Title  Patient will don socks and shoes, and fasten/tie shoes with modified independence    Status  Achieved        OT Long Term Goals - 08/23/18 2143      OT LONG TERM GOAL #1   Title  Patient will demonstrate sufficient activity tolerance and functional mobility to return work 4-6 hours per day following daily self care skills. - 09/03/2018     Time  8    Period  Weeks    Status  On-going      OT LONG TERM GOAL #2   Title  Patient will prepare a hot meal consisting of at least two items with no more than intermittent min assist    Time  8    Period  Weeks    Status  Achieved      OT LONG TERM GOAL #3   Title  Patient will complete simple housekeeping and laundry from ambulatory level without physical assistance    Time  8    Period  Weeks    Status  Achieved      OT LONG TERM GOAL #4   Title  Patient will demonstrate improved dynamic stand balance to stand without UE support for greater than 10 min while completing familiar functional task.      Time  8    Period  Weeks    Status  On-going            Plan - 08/23/18 2143    Clinical Impression Statement  Pt making excellent progress toward goals. Pt demonstrating improved dynamic balance as well as improve postural alignment and control .    Occupational Profile and client history currently impacting functional performance  Patient is married, worked full time as Glass blower/designer, very active in her church with excellent extended family support    Occupational performance deficits (Please refer to evaluation for details):  ADL's;IADL's;Work;Leisure    Rehab Potential  Excellent    OT Frequency  2x / week    OT Duration  8 weeks    OT Treatment/Interventions  Self-care/ADL training;Therapeutic exercise;Aquatic Therapy;Balance training;Therapeutic activities;Functional Mobility Training;DME and/or AE instruction;Energy conservation;Patient/family education;Neuromuscular education;Stair Training    Plan  Aquatic therapy to address functional mobility, standing /activity  tolerance, decrease UE reliance in standing, postural alignment and control     Consulted and Agree with Plan of Care  Patient       Patient will benefit from skilled therapeutic intervention in order to improve the following deficits and impairments:  Decreased knowledge of use of DME, Pain,  Decreased mobility, Impaired sensation, Improper body mechanics, Decreased activity tolerance, Decreased endurance, Decreased strength, Increased muscle spasms, Improper spinal/pelvic alignment, Decreased balance, Difficulty walking  Visit Diagnosis: Muscle weakness (generalized)  Other lack of coordination  Other disturbances of skin sensation  Unsteadiness on feet    Problem List Patient Active Problem List   Diagnosis Date Noted  . Anxiety and depression 05/30/2018  . E. coli UTI 04/27/2018  . Acute blood loss anemia   .  Acute deep vein thrombosis (DVT) of right femoral vein ([place])   . Hyponatremia   . Hypokalemia   . Hypomagnesemia   . Ileus, postoperative ([place])   . Thoracic myelopathy 03/21/2018  . Neurogenic bowel 03/21/2018  . Neurogenic bladder 03/21/2018  . Left wrist fracture, sequela 03/21/2018  . Numbness 02/15/2018  . Ataxic gait 02/15/2018  . Left leg weakness 02/15/2018  . Other fatigue 02/15/2018  . Urinary incontinence 02/15/2018  . Bowel incontinence 02/15/2018    Quay Burow, OTR/L 08/23/2018, 9:46 PM  [facility] [address] [place], [place], [address] Phone: [phone]   Fax:  [phone]  Name: Hannah Jefferson MRN: [id number] Date of Birth: Apr 09, 1956

## 2018-08-24 DIAGNOSIS — M4804 Spinal stenosis, thoracic region: Secondary | ICD-10-CM | POA: Diagnosis not present

## 2018-08-24 DIAGNOSIS — Z9889 Other specified postprocedural states: Secondary | ICD-10-CM | POA: Diagnosis not present

## 2018-08-24 DIAGNOSIS — Z09 Encounter for follow-up examination after completed treatment for conditions other than malignant neoplasm: Secondary | ICD-10-CM | POA: Diagnosis not present

## 2018-08-24 DIAGNOSIS — Z86018 Personal history of other benign neoplasm: Secondary | ICD-10-CM | POA: Diagnosis not present

## 2018-08-24 DIAGNOSIS — D321 Benign neoplasm of spinal meninges: Secondary | ICD-10-CM | POA: Diagnosis not present

## 2018-08-28 ENCOUNTER — Ambulatory Visit: Payer: 59 | Admitting: Physical Therapy

## 2018-08-28 ENCOUNTER — Encounter: Payer: Self-pay | Admitting: Physical Therapy

## 2018-08-28 ENCOUNTER — Encounter: Payer: 59 | Admitting: Occupational Therapy

## 2018-08-28 DIAGNOSIS — R208 Other disturbances of skin sensation: Secondary | ICD-10-CM

## 2018-08-28 DIAGNOSIS — M6281 Muscle weakness (generalized): Secondary | ICD-10-CM | POA: Diagnosis not present

## 2018-08-28 DIAGNOSIS — R2689 Other abnormalities of gait and mobility: Secondary | ICD-10-CM

## 2018-08-28 DIAGNOSIS — R2681 Unsteadiness on feet: Secondary | ICD-10-CM

## 2018-08-28 DIAGNOSIS — R278 Other lack of coordination: Secondary | ICD-10-CM

## 2018-08-28 NOTE — Therapy (Signed)
[facility] [address] [place], [place], [address] Phone: [phone]   Fax:  [phone]  Physical Therapy Treatment  Patient Details  Name: Hannah Jefferson MRN: [id number] Date of Birth: 09-24-1956 Referring Provider (PT): Dr Hannah Jefferson   Encounter Date: 08/28/2018  PT End of Session - 08/28/18 1244    Visit Number  15    Number of Visits  17    Date for PT Re-Evaluation  09/03/18    Authorization Type  U[employer]: 20 visits PT and 20 visits OT    Authorization - Visit Number  15    Authorization - Number of Visits  20    PT Start Time  1100    PT Stop Time  1145    PT Time Calculation (min)  45 min    Equipment Utilized During Treatment  Gait belt    Activity Tolerance  Patient tolerated treatment well    Behavior During Therapy  [facility] for tasks assessed/performed       Past Medical History:  Diagnosis Date  . Chronic left hip pain   . GERD (gastroesophageal reflux disease)   . Gestational diabetes   . Hearing loss of both ears   . Hypertension   . Neuropathy    numbness/tingling from waist down  . Vision abnormalities     Past Surgical History:  Procedure Laterality Date  . IR IVC FILTER PLMT / S&I /IMG GUID/MOD SED  04/05/2018  . LAPAROSCOPIC CHOLECYSTECTOMY  1992  . REFRACTIVE SURGERY Bilateral 2004    There were no vitals filed for this visit.  Subjective Assessment - 08/28/18 1105    Subjective  Reports using her cane in the community and intermittantly walks around the house with no AD. Reports being "wobbly" at church but able to recover with no fall episodes. Reports having an MRI with reports the tumor has not returned. Reports increased scar tissue that may be pushing against her spine but her doctor does not want to remove the scar tissue at this time unless she begins to decline. Will follow up with her doctor in 1 year for next appt.     Patient is accompained by:  Family member    Pertinent  History  B DVTs s/p 4/20019 surgery with IVC filter in place, hx of gestation DM only, HTN, Chronic L hip pain, scoliosis, hiatal hernia    Patient Stated Goals  I want to get back as much as possible.     Currently in Pain?  No/denies         [facility] PT Assessment - 08/28/18 1128      Standardized Balance Assessment   Standardized Balance Assessment  --                   [facility] Adult PT Treatment/Exercise - 08/28/18 1129      Transfers   Transfers  Sit to Stand;Stand to Sit;Stand Pivot Transfers    Sit to Stand  6: Modified independent (Device/Increase time)    Stand to Sit  6: Modified independent (Device/Increase time)    Stand Pivot Transfers  6: Modified independent (Device/Increase time)      Ambulation/Gait   Ambulation/Gait  Yes    Ambulation/Gait Assistance  6: Modified independent (Device/Increase time);5: Supervision    Ambulation/Gait Assistance Details  Pt performs x1 ambulation trial outdoors navigating uneven pavement, inclines, and declines at Mod I level with her cane for 1000'. Pt performs 2nd ambulation trial indoors on level  surface with no AD performing head turns in all directions when cued, varying gait speed, spontaneous stopping, retrograde ambulation, and performing quick turns with therapist providing supervision. Pt demonstrates ability to recover with internal pertubations utilizing stepping strategy.    Ambulation Distance (Feet)  1000 Feet   200' indoors on level surface    Assistive device  Small based quad cane;None    Gait Pattern  Step-through pattern;Decreased dorsiflexion - right;Decreased dorsiflexion - left;Decreased stride length;Wide base of support    Ambulation Surface  Level;Indoor;Unlevel;Outdoor;Paved    Curb  5: Supervision    Curb Details (indicate cue type and reason)  Therapist provides supervision for safety consideration with Pt demonstrating proper sequence and technique with good carry over and recall      Balance    Balance Assessed  Yes      Dynamic Standing Balance   Dynamic Standing - Comments  Pt performs standing balance on ramp standing on compliant surface for trials of 30 second intervals. Pt performs feet apart with eyes open for 30 seconds, feet apart with horizontal head turns and then feet apart with eyes closed. Pt demonstrates x1 LOB episode with therapist providing min A for steadying.       Berg Balance Test   Sit to Stand  Able to stand without using hands and stabilize independently    Standing Unsupported  Able to stand safely 2 minutes    Sitting with Back Unsupported but Feet Supported on Floor or Stool  Able to sit safely and securely 2 minutes    Stand to Sit  Sits safely with minimal use of hands    Transfers  Able to transfer safely, minor use of hands    Standing Unsupported with Eyes Closed  Able to stand 10 seconds with supervision    Standing Ubsupported with Feet Together  Able to place feet together independently and stand 1 minute safely    From Standing, Reach Forward with Outstretched Arm  Can reach confidently >25 cm (10")    From Standing Position, Pick up Object from Floor  Able to pick up shoe safely and easily    From Standing Position, Turn to Look Behind Over each Shoulder  Looks behind from both sides and weight shifts well    Turn 360 Degrees  Able to turn 360 degrees safely but slowly    Standing Unsupported, Alternately Place Feet on Step/Stool  Able to complete >2 steps/needs minimal assist    Standing Unsupported, One Foot in Front  Able to place foot tandem independently and hold 30 seconds    Standing on One Leg  Tries to lift leg/unable to hold 3 seconds but remains standing independently    Total Score  47      Therapeutic Activites    Therapeutic Activities  Other Therapeutic Activities    Other Therapeutic Activities  Pt performs mat<>floor transfer with therapist providing supervision for safety and verbal cues for proper technique.               PT Education - 08/28/18 1158    Education Details  Therapist provided education regarding POC moving forward with recertification process and clinical findings of LTG reassessment.     Person(s) Educated  Patient;Child(ren)   Son Hannah Jefferson   Methods  Explanation    Comprehension  Verbalized understanding       PT Short Term Goals - 08/03/18 1513      PT SHORT TERM GOAL #1   Title  Pt  will be IND in HEP to improve deficits listed above. TARGET DATE FOR ALL STGS: 08/02/18    Baseline  Pt reports ability to perform HEP independently- achieved on 9/19    Status  Achieved      PT SHORT TERM GOAL #2   Title  Pt will improve TUG time with LRAD to </=13.5sec. to decr. falls risk.     Baseline  TUG score improved to 12.97 seconds using RW- Achieved on 9/19     Status  Achieved      PT SHORT TERM GOAL #3   Title  Pt will improve gait speed to >/=2.32f/sec, with LRAD to safely amb. in the community.     Baseline  Gait speed improved to 3.21 ft/sec using RW- Achieved on 9/19     Status  Achieved      PT SHORT TERM GOAL #4   Title  Pt will amb. 500' over even terrain, with LRAD at MOD I Level to improve safety during functional mobility.     Baseline  Pt demonstrates ability to ambulate over even terrain Mod I using RW for 500 ft- Achieved on 9/19    Status  Achieved      PT SHORT TERM GOAL #5   Title  Perform BERG and write STG and LTG    Baseline  30/56    Status  Achieved      PT SHORT TERM GOAL #6   Title  Pt will improve BERG balance test to 34/56 in order to indicate decreased fall risk.     Time  4    Period  Weeks    Status  Achieved   Berg Score 43/56- goal achieved on 9/19       PT Long Term Goals - 08/28/18 1155      PT LONG TERM GOAL #1   Title  Pt will amb. 1000' over even/paved terrain with LRAD at MOD I level to improve functional mobility. TARGET DATE FOR ALL LTGS: 08/30/18    Baseline  10/14: Patient ambulates 1000' over paved terrain with her cane  at Mod I level     Status  Achieved      PT LONG TERM GOAL #2   Title  Trial amb. without AD when appropriate and write goal as indicated.     Status  Achieved      PT LONG TERM GOAL #3   Title  Pt will report no falls in the last 4 weeks to improve safety.     Baseline  10/14: Patient reports no falls in the last 4 weeks    Status  Achieved      PT LONG TERM GOAL #4   Title  Pt will improve BERG balance score to >/=50/56 in order to indicate decreased fall risk.     Baseline  10/14: Pt's Berg score improved to 47/56; however, not to goal level.     Time  8    Period  Weeks    Status  Not Met      PT LONG TERM GOAL #5   Title  Pt will perform floor recovery tranfser at S level in order to indicate safety in case of fall but also to ensure safety when playing in floor with family.     Baseline  10/14: Pt performs floor recovery transfer at S level with therapist providing verbal cues for proper technique     Time  8    Period  Weeks  Status  Achieved            Plan - 08/28/18 1245    Clinical Impression Statement  Today's skilled session focused on reassessment of patient LTG's. Overall, patient has met 4/5 LTG's demonstrating improvement in strength, balance, and functional mobility. Pt's Berg score improved to 47/56 indicating reduction in fall risk potential; however, not to goal level. Pt demonstrates ability to ambulate community distances on uneven terrain with her cane at Mod I level. Pt demonstrates ability to perform floor transfer safely with therapist providing supervision for safety consideration and proper technique for energy conservation. Pt will continue to benefit from skilled PT to address balance, strength, and functional mobility deficits.     PT Frequency  2x / week    PT Duration  8 weeks    PT Treatment/Interventions  ADLs/Self Care Home Management;Biofeedback;Electrical Stimulation;Therapeutic exercise;Therapeutic activities;Functional mobility  training;Stair training;Orthotic Fit/Training;Gait training;Patient/family education;DME Instruction;Neuromuscular re-education;Balance training;Vestibular;Dry needling    PT Next Visit Plan Primary PT to recert; continue to address gait with no AD over various surfaces, dynamic balance reactions      Consulted and Agree with Plan of Care  Patient;Family member/caregiver    Family Member Consulted  son       Patient will benefit from skilled therapeutic intervention in order to improve the following deficits and impairments:  Abnormal gait, Decreased endurance, Impaired sensation, Decreased knowledge of use of DME, Decreased strength, Decreased balance, Decreased mobility, Decreased range of motion, Decreased coordination  Visit Diagnosis: Muscle weakness (generalized)  Other lack of coordination     Problem List Patient Active Problem List   Diagnosis Date Noted  . Anxiety and depression 05/30/2018  . E. coli UTI 04/27/2018  . Acute blood loss anemia   . Acute deep vein thrombosis (DVT) of right femoral vein ([place])   . Hyponatremia   . Hypokalemia   . Hypomagnesemia   . Ileus, postoperative ([place])   . Thoracic myelopathy 03/21/2018  . Neurogenic bowel 03/21/2018  . Neurogenic bladder 03/21/2018  . Left wrist fracture, sequela 03/21/2018  . Numbness 02/15/2018  . Ataxic gait 02/15/2018  . Left leg weakness 02/15/2018  . Other fatigue 02/15/2018  . Urinary incontinence 02/15/2018  . Bowel incontinence 02/15/2018    Floreen Comber, SPT 08/28/2018, 12:48 PM  [facility] [address] [place], [place], [address] Phone: [phone]   Fax:  [phone]  Name: Hannah Jefferson MRN: [id number] Date of Birth: [birth year]  This note has been reviewed and edited by supervising CI. Adjusted the plan for next treatment.  Willow Ora, PTA, [facility] [address] [place], [place]  [address] [phone] 08/29/18, 8:06 AM

## 2018-08-29 NOTE — Addendum Note (Signed)
Addended by: Cameron Sprang A on: 08/29/2018 10:06 AM   Modules accepted: Orders

## 2018-08-30 ENCOUNTER — Encounter: Payer: 59 | Admitting: Occupational Therapy

## 2018-08-30 ENCOUNTER — Encounter: Payer: Self-pay | Admitting: Occupational Therapy

## 2018-08-30 ENCOUNTER — Ambulatory Visit: Payer: 59 | Admitting: Occupational Therapy

## 2018-08-30 ENCOUNTER — Ambulatory Visit: Payer: 59 | Admitting: Physical Therapy

## 2018-08-30 DIAGNOSIS — R2681 Unsteadiness on feet: Secondary | ICD-10-CM

## 2018-08-30 DIAGNOSIS — R293 Abnormal posture: Secondary | ICD-10-CM

## 2018-08-30 DIAGNOSIS — R208 Other disturbances of skin sensation: Secondary | ICD-10-CM

## 2018-08-30 DIAGNOSIS — M6281 Muscle weakness (generalized): Secondary | ICD-10-CM

## 2018-08-30 NOTE — Therapy (Signed)
[facility] [address] [place], [place], [address] Phone: [phone]   Fax:  [phone]  Occupational Therapy Treatment  Patient Details  Name: Hannah Jefferson MRN: [id number] Date of Birth: 02-27-1956 No data recorded  Encounter Date: 08/30/2018  OT End of Session - 08/30/18 2128    Visit Number  16    Number of Visits  20    Date for OT Re-Evaluation  10/11/18    Authorization Type  [employer] 20 visit limit OT    Authorization - Visit Number  16    Authorization - Number of Visits  20    OT Start Time  1350    OT Stop Time  1440    OT Time Calculation (min)  50 min    Activity Tolerance  Patient tolerated treatment well       Past Medical History:  Diagnosis Date  . Chronic left hip pain   . GERD (gastroesophageal reflux disease)   . Gestational diabetes   . Hearing loss of both ears   . Hypertension   . Neuropathy    numbness/tingling from waist down  . Vision abnormalities     Past Surgical History:  Procedure Laterality Date  . IR IVC FILTER PLMT / S&I /IMG GUID/MOD SED  04/05/2018  . LAPAROSCOPIC CHOLECYSTECTOMY  1992  . REFRACTIVE SURGERY Bilateral 2004    There were no vitals filed for this visit.  Subjective Assessment - 08/30/18 2126    Subjective   I can tell my legs are stronger and my balance is better from doing water therapy    Patient is accompained by:  --   friend   Pertinent History  surgery to remove tumor on spine 03/13/18    Limitations  none    Currently in Pain?  No/denies         Patient seen for aquatic therapy today.  Treatment took place in water 2.5-4 feet deep depending upon activity.  Pt entered the pool via stairs using bilateral railings and vc's to descend stairs with RLE and cues for postural alignment.  Treatment focused on: use of water based Ai Chi to address core strength and stability, dynamic standing balance, slow controlled reciprocal UE and LE movements off  stable BOS, activity tolerance and transitional movements. Also addressed use of head and trunk rotation to decrease rigidity in trunk, shoulders and head while increasing control and activity of cores muscles.  Addressed lower trunk initiated movement for lateral and anterior weight shifts while sitting on noodle to facilitate sitting balance response.  Addressed LE strengthening for hip abd/adduction as well as hip extension.  Addressed dynamic standing balance and trunk alignment and stability with side stepping and braiding first with UE support and then without UE support.  Performed in deeper water to increase resistance.  Addressed core stability with UE in heavy weight bearing with UE on stairs and completing planks and side planks.  Pt required min a to complete side plank while on L side and performed in deeper water to allow water to assist.  Addressed functional ambulation at faster speed to facilitated arm swing, trunk rotation and decreasing abnormal posturing through shoulders and head.  Utilized random head turns with all activities to further accentuate this and challenge dynamic standing balance.  Pt exited pool via steps using RLE to power up with intermittent min a and vc's as well as light BUE support on railings.  OT Short Term Goals - 08/30/18 2133      OT SHORT TERM GOAL #1   Title  Patient will complete a home exercise program designed to improve grip strength - due 08/04/18    Status  Achieved      OT SHORT TERM GOAL #2   Title  Patient wil stand in shower from tub transfer bench in shower to wash periarea with supervision    Status  Achieved      OT [place] #3   Title  Patient will demonstrate understanding of graduated driving program, and/or driving resources to prepare for return to driving    Status  Achieved      OT [place] #4   Title  Patient will don socks and shoes, and fasten/tie shoes with modified  independence    Status  Achieved        OT Long Term Goals - 08/30/18 2133      OT LONG TERM GOAL #1   Title  Patient will demonstrate sufficient activity tolerance and functional mobility to return work 4-6 hours per day following daily self care skills. - 09/29/2018    Time  8    Period  Weeks    Status  On-going      OT LONG TERM GOAL #2   Title  Patient will prepare a hot meal consisting of at least two items with no more than intermittent min assist    Time  8    Period  Weeks    Status  Achieved      OT LONG TERM GOAL #3   Title  Patient will complete simple housekeeping and laundry from ambulatory level without physical assistance    Time  8    Period  Weeks    Status  Achieved      OT LONG TERM GOAL #4   Title  Patient will demonstrate improved dynamic stand balance to stand without UE support for greater than 15 min while completing familiar functional task.      Time  8    Period  Weeks    Status  Revised      OT LONG TERM GOAL #5   Title  Pt will be mod I with aquatic based HEP for community wellness     Status  New            Plan - 08/30/18 2127    Clinical Impression Statement  Pt continues to make good progress toward goals.  Pt with decreased stabilizing through head and shoulders during transitional movements.     Occupational Profile and client history currently impacting functional performance  Patient is married, worked full time as Glass blower/designer, very active in her church with excellent extended family support    Occupational performance deficits (Please refer to evaluation for details):  ADL's;IADL's;Work;Leisure    Rehab Potential  Excellent    OT Frequency  1x / week    OT Duration  4 weeks    OT Treatment/Interventions  Self-care/ADL training;Therapeutic exercise;Aquatic Therapy;Balance training;Therapeutic activities;Functional Mobility Training;DME and/or AE instruction;Energy conservation;Patient/family education;Neuromuscular  education;Stair Training    Plan  Aquatic therapy to address functional mobility, standing /activity  tolerance, decrease UE reliance in standing, postural alignment and control     Consulted and Agree with Plan of Care  Patient       Patient will benefit from skilled therapeutic intervention in order to improve the following deficits and impairments:  Decreased knowledge of  use of DME, Pain, Decreased mobility, Impaired sensation, Improper body mechanics, Decreased activity tolerance, Decreased endurance, Decreased strength, Increased muscle spasms, Improper spinal/pelvic alignment, Decreased balance, Difficulty walking  Visit Diagnosis: Muscle weakness (generalized) - Plan: Ot plan of care cert/re-cert  Unsteadiness on feet - Plan: Ot plan of care cert/re-cert  Other disturbances of skin sensation - Plan: Ot plan of care cert/re-cert  Abnormal posture - Plan: Ot plan of care cert/re-cert    Problem List Patient Active Problem List   Diagnosis Date Noted  . Anxiety and depression 05/30/2018  . E. coli UTI 04/27/2018  . Acute blood loss anemia   . Acute deep vein thrombosis (DVT) of right femoral vein ([place])   . Hyponatremia   . Hypokalemia   . Hypomagnesemia   . Ileus, postoperative ([place])   . Thoracic myelopathy 03/21/2018  . Neurogenic bowel 03/21/2018  . Neurogenic bladder 03/21/2018  . Left wrist fracture, sequela 03/21/2018  . Numbness 02/15/2018  . Ataxic gait 02/15/2018  . Left leg weakness 02/15/2018  . Other fatigue 02/15/2018  . Urinary incontinence 02/15/2018  . Bowel incontinence 02/15/2018    Quay Burow, OTR/L 08/30/2018, 9:39 PM  [facility] [address] [place], [place], [address] Phone: [phone]   Fax:  [phone]  Name: Hannah Jefferson MRN: [id number] Date of Birth: 09-08-1956

## 2018-09-04 ENCOUNTER — Encounter: Payer: Self-pay | Admitting: Rehabilitation

## 2018-09-04 ENCOUNTER — Encounter: Payer: 59 | Admitting: Occupational Therapy

## 2018-09-04 ENCOUNTER — Ambulatory Visit: Payer: 59 | Admitting: Rehabilitation

## 2018-09-04 DIAGNOSIS — R2681 Unsteadiness on feet: Secondary | ICD-10-CM

## 2018-09-04 DIAGNOSIS — R278 Other lack of coordination: Secondary | ICD-10-CM

## 2018-09-04 DIAGNOSIS — M6281 Muscle weakness (generalized): Secondary | ICD-10-CM | POA: Diagnosis not present

## 2018-09-04 DIAGNOSIS — R208 Other disturbances of skin sensation: Secondary | ICD-10-CM

## 2018-09-04 NOTE — Therapy (Signed)
[facility] [address] [place], [place], [address] Phone: [phone]   Fax:  [phone]  Physical Therapy Treatment  Patient Details  Name: Hannah Jefferson MRN: [id number] Date of Birth: 03/19/1956 Referring Provider (PT): Dr Naaman Plummer   Encounter Date: 09/04/2018  PT End of Session - 09/04/18 1200    Visit Number  16    Number of Visits  22   per updated POC   Date for PT Re-Evaluation  10/13/18   per updated POC (only need 5 weeks)   Authorization Type  [employer]: 20 visits PT and 20 visits OT    Authorization - Visit Number  16    Authorization - Number of Visits  20    PT Start Time  1100    PT Stop Time  1145    PT Time Calculation (min)  45 min    Equipment Utilized During Treatment  Gait belt    Activity Tolerance  Patient tolerated treatment well    Behavior During Therapy  WFL for tasks assessed/performed       Past Medical History:  Diagnosis Date  . Chronic left hip pain   . GERD (gastroesophageal reflux disease)   . Gestational diabetes   . Hearing loss of both ears   . Hypertension   . Neuropathy    numbness/tingling from waist down  . Vision abnormalities     Past Surgical History:  Procedure Laterality Date  . IR IVC FILTER PLMT / S&I /IMG GUID/MOD SED  04/05/2018  . LAPAROSCOPIC CHOLECYSTECTOMY  1992  . REFRACTIVE SURGERY Bilateral 2004    There were no vitals filed for this visit.  Subjective Assessment - 09/04/18 1102    Subjective  Reports her hips were causing her pain this weekend and is experiencing shooting pains in her lower back region. Patient reports taking it easy this past weekend and only performed her stretches. Reports she has taken x2 Tylenol prior to therapy today and is not experiencing any pain.     Patient is accompained by:  Family member    Pertinent History  B DVTs s/p 4/20019 surgery with IVC filter in place, hx of gestation DM only, HTN, Chronic L hip pain,  scoliosis, hiatal hernia    Patient Stated Goals  I want to get back as much as possible.     Currently in Pain?  No/denies    Pain Onset  --                       [facility] Adult PT Treatment/Exercise - 09/04/18 1150      Transfers   Transfers  Sit to Stand;Stand to [employer]    Sit to Stand  5: Supervision    Stand to Sit  5: Supervision    Stand Pivot Transfers  5: Supervision    Transfer Cueing  Therapist provides verbal cues for patient to perform STS and transfers with no UE assist to increase challenge. Patient able to achieve stand with no UE assist s/p several attempts. Therapist provided supervision for safety consideration.      Ambulation/Gait   Ambulation/Gait  Yes    Ambulation/Gait Assistance  5: Supervision    Ambulation/Gait Assistance Details  Pt performs multiple ambulation trials with no AD while performing dual tasks for increased challenge. Pt demonstrates ability to ambulate while performing head turns in all directions, reciprocal arm swing to optimize gait pattern, and maintaining gait sequence while participating  in dual cognitive task. Therapist provides verbal cues for maintaining forward gaze. Pt progresses to x1 trial consisting of forward stepping over hurdles of varying heights and performing cone weaving. Pt demonstrates increased difficulty performing hurdles requiring intermittant UE assist for steadying.     Ambulation Distance (Feet)  500 Feet    Assistive device  None    Gait Pattern  Step-through pattern;Decreased dorsiflexion - right;Decreased dorsiflexion - left;Decreased stride length;Wide base of support    Ambulation Surface  Level;Indoor      Balance   Balance Assessed  Yes      Dynamic Standing Balance   Rocker board comments:  Pt performs multiple trials of standing balance on rocker board for 30 second intervals consisting of; feet apart eyes open; feet apart eyes closed; & head turns in all directions. Pt  demonstrates difficulty with recovering from posterior LOB on rocker board requiring multimodal cues to anteriorly weight shift and hinge at hips to recover to center. Therapist manually shifts patient weight posteriorly for x10 reps with patient demonstrating ability to recover back to midline with cues for reinforcement for proper technique.      Dynamic Standing - Comments  Pt performs anterograde/retrograde stepping and tandem walking for multiple trials on level surface progressing to compliant surface with therapist providing min guard and verbal cues for proper technique. Patient demonstrates increased difficulty performing tandem walking 2/2 to narrow BOS requiring intermittant UE assist. Pt progresses to forwards/backwards marching and side stepping on compliant surface. Pt then performs side stepping while cone tapping, double tapping cones, crossing midline cone tapping, and lateral cone tapping for increased challenge with glut med activation during single limb stance.              PT Education - 09/04/18 1159    Education Details  Therapist provided education regarding clinical findings for glut med weakness and education regarding balance strategies.     Person(s) Educated  Patient;Child(ren)    Methods  Explanation;Demonstration    Comprehension  Verbalized understanding;Returned demonstration       PT Short Term Goals - 08/03/18 1513      PT SHORT TERM GOAL #1   Title  Pt will be IND in HEP to improve deficits listed above. TARGET DATE FOR ALL STGS: 08/02/18    Baseline  Pt reports ability to perform HEP independently- achieved on 9/19    Status  Achieved      PT SHORT TERM GOAL #2   Title  Pt will improve TUG time with LRAD to </=13.5sec. to decr. falls risk.     Baseline  TUG score improved to 12.97 seconds using RW- Achieved on 9/19     Status  Achieved      PT SHORT TERM GOAL #3   Title  Pt will improve gait speed to >/=2.53ft/sec, with LRAD to safely amb. in the  community.     Baseline  Gait speed improved to 3.21 ft/sec using RW- Achieved on 9/19     Status  Achieved      PT SHORT TERM GOAL #4   Title  Pt will amb. 500' over even terrain, with LRAD at MOD I Level to improve safety during functional mobility.     Baseline  Pt demonstrates ability to ambulate over even terrain Mod I using RW for 500 ft- Achieved on 9/19    Status  Achieved      PT SHORT TERM GOAL #5   Title  Perform BERG and write STG  and LTG    Baseline  30/56    Status  Achieved      PT SHORT TERM GOAL #6   Title  Pt will improve BERG balance test to 34/56 in order to indicate decreased fall risk.     Time  4    Period  Weeks    Status  Achieved   Berg Score 43/56- goal achieved on 9/19       PT Long Term Goals - 08/29/18 0958      PT LONG TERM GOAL #4   Title  Pt will improve BERG balance score to >/=51/56 in order to indicate decreased fall risk. (updated goal date: 10/13/18)    Baseline  47/56 on 10/14    Time  5    Period  Weeks    Status  Revised      PT LONG TERM GOAL #5   Title  Pt will perform floor recovery tranfser at mod I level in order to indicate safety in case of fall but also to ensure safety when playing in floor with family.     Baseline  S on 10/14    Time  5    Period  Weeks    Status  Revised      Additional Long Term Goals   Additional Long Term Goals  Yes      PT LONG TERM GOAL #6   Title  Pt will ambulate x 150' level indoor surfaces while negotiating turns, performing head motions and with environmental distractions at mod I level without AD in order to indicate improved functional mobility.     Time  5    Period  Weeks    Status  New      PT LONG TERM GOAL #7   Title  Pt will negotiate up/down 8 stairs with single rail in reciprocal pattern at mod I level in order to indicate improved home and community mobility as well as LE strength.     Time  5    Period  Weeks    Status  New            Plan - 09/04/18 1200     Clinical Impression Statement  Today's skilled session primarily focused on dynamic standing balance with progressing to multiple ambulation trials with no AD. Pt demonstrates increased challenge with dynamic standing balance on rocker board and compliant surface demonstrating delayed righting reactions during LOB episodes requiring min A from therapist for [place]. Patient would benefit from implementation of practicing balance strategies to improve safety during dynamic gait trials. Patient demonstrates increased difficulty performing hurdles of various heights indicated by circumduction around hurdles as compensatory movement for LE weakness and balance deficits. Patient will continue to benefit from LE strengthening and high level balance to improve safety with functional mobility.     PT Frequency  1x / week    PT Duration  Other (comment)   5 weeks   PT Treatment/Interventions  ADLs/Self Care Home Management;Biofeedback;Electrical Stimulation;Therapeutic exercise;Therapeutic activities;Functional mobility training;Stair training;Orthotic Fit/Training;Gait training;Patient/family education;DME Instruction;Neuromuscular re-education;Balance training;Vestibular;Dry needling    PT Next Visit Plan  corner balance HEP, balance strategies progress to resisted gait, hurdles forward/side stepping, ramp balance progress to compliant surface, forward stepping to 6 inch or more block, ambulation over uneven surface, bosu ball balance with mini squats ball toss cone reaching    Consulted and Agree with Plan of Care  Patient;Family member/caregiver    Family Member Consulted  son  Patient will benefit from skilled therapeutic intervention in order to improve the following deficits and impairments:  Abnormal gait, Decreased endurance, Impaired sensation, Decreased knowledge of use of DME, Decreased strength, Decreased balance, Decreased mobility, Decreased range of motion, Decreased coordination  Visit  Diagnosis: Muscle weakness (generalized)  Other lack of coordination     Problem List Patient Active Problem List   Diagnosis Date Noted  . Anxiety and depression 05/30/2018  . E. coli UTI 04/27/2018  . Acute blood loss anemia   . Acute deep vein thrombosis (DVT) of right femoral vein ([place])   . Hyponatremia   . Hypokalemia   . Hypomagnesemia   . Ileus, postoperative ([place])   . Thoracic myelopathy 03/21/2018  . Neurogenic bowel 03/21/2018  . Neurogenic bladder 03/21/2018  . Left wrist fracture, sequela 03/21/2018  . Numbness 02/15/2018  . Ataxic gait 02/15/2018  . Left leg weakness 02/15/2018  . Other fatigue 02/15/2018  . Urinary incontinence 02/15/2018  . Bowel incontinence 02/15/2018    Floreen Comber, SPT 09/04/2018, 12:05 PM  [facility] [address] [place], [place], [address] Phone: [phone]   Fax:  [phone]  Name: Hannah Jefferson MRN: [id number] Date of Birth: [birth year]

## 2018-09-06 ENCOUNTER — Ambulatory Visit: Payer: 59 | Admitting: Physical Therapy

## 2018-09-06 ENCOUNTER — Encounter: Payer: 59 | Admitting: Occupational Therapy

## 2018-09-06 ENCOUNTER — Ambulatory Visit: Payer: 59 | Admitting: Occupational Therapy

## 2018-09-06 ENCOUNTER — Encounter: Payer: Self-pay | Admitting: Occupational Therapy

## 2018-09-06 DIAGNOSIS — M6281 Muscle weakness (generalized): Secondary | ICD-10-CM | POA: Diagnosis not present

## 2018-09-06 DIAGNOSIS — R2681 Unsteadiness on feet: Secondary | ICD-10-CM

## 2018-09-06 DIAGNOSIS — R208 Other disturbances of skin sensation: Secondary | ICD-10-CM

## 2018-09-06 DIAGNOSIS — R293 Abnormal posture: Secondary | ICD-10-CM

## 2018-09-06 DIAGNOSIS — R278 Other lack of coordination: Secondary | ICD-10-CM

## 2018-09-06 NOTE — Therapy (Signed)
[facility] [address] [place], [place], [address] Phone: [phone]   Fax:  [phone]  Occupational Therapy Treatment  Patient Details  Name: BRIANNI Jefferson MRN: [id number] Date of Birth: 1956/04/17 No data recorded  Encounter Date: 09/06/2018  OT End of Session - 09/06/18 2124    Visit Number  17    Number of Visits  20    Date for OT Re-Evaluation  10/11/18    Authorization Type  [employer] 20 visit limit OT    Authorization - Visit Number  17    Authorization - Number of Visits  20    OT Start Time  3267    OT Stop Time  1510    OT Time Calculation (min)  52 min    Activity Tolerance  Patient tolerated treatment well       Past Medical History:  Diagnosis Date  . Chronic left hip pain   . GERD (gastroesophageal reflux disease)   . Gestational diabetes   . Hearing loss of both ears   . Hypertension   . Neuropathy    numbness/tingling from waist down  . Vision abnormalities     Past Surgical History:  Procedure Laterality Date  . IR IVC FILTER PLMT / S&I /IMG GUID/MOD SED  04/05/2018  . LAPAROSCOPIC CHOLECYSTECTOMY  1992  . REFRACTIVE SURGERY Bilateral 2004    There were no vitals filed for this visit.  Subjective Assessment - 09/06/18 2122    Subjective   My mobility just keeps getting better and better    Pertinent History  surgery to remove tumor on spine 03/13/18    Limitations  none    Currently in Pain?  No/denies         Patient seen for aquatic therapy today.  Treatment took place in water 2.5-4 feet deep depending upon activity.  Pt entered the pool via steps using both railings with distant supervision and only 1 vc for postural alignment.  Treatment focused on functional ambulation forward without UE support with emphasis on postural alignment as well as increasing demand for core strength and dynamic balance by using aqua jogger backward to increased frontal resistance as well as  increasing speed of gait in deeper water. Also addressed dynamic standing balance by using backward walking without UE support as using resistance to reinforce postural alignment and thoracic extension.  Addressed core strength through the use of planks (front and side) and use of heavy concept to increased recruitment of deep core muscles for postural control. Pt able to move into side plank with only 1UE support and then complete trunk rotation while in side plank - pt needed min a when using R side to stabilize and contact guard for L side.  Pt also needs cues for appropriate postural alignment with planks.  Addressed core control and dynamic standing balance as well as postural alignment all with light resistance by using Ai Chi positions 1-8.  Pt with increased difficulty maintaining balance with more advanced poses and during transitions of poses however with practice and slowing activity down performance improved. Pt was unable to do positions 7 and 8 last session.  Pt exited the pool via steps using railings and using RLE as power leg with min facilitation only on last two steps.                        OT Short Term Goals - 08/30/18 2133  OT SHORT TERM GOAL #1   Title  Patient will complete a home exercise program designed to improve grip strength - due 08/04/18    Status  Achieved      OT SHORT TERM GOAL #2   Title  Patient wil stand in shower from tub transfer bench in shower to wash periarea with supervision    Status  Achieved      OT [place] #3   Title  Patient will demonstrate understanding of graduated driving program, and/or driving resources to prepare for return to driving    Status  Achieved      OT [place] #4   Title  Patient will don socks and shoes, and fasten/tie shoes with modified independence    Status  Achieved        OT Long Term Goals - 08/30/18 2133      OT LONG TERM GOAL #1   Title  Patient will demonstrate sufficient  activity tolerance and functional mobility to return work 4-6 hours per day following daily self care skills. - 09/29/2018    Time  8    Period  Weeks    Status  On-going      OT LONG TERM GOAL #2   Title  Patient will prepare a hot meal consisting of at least two items with no more than intermittent min assist    Time  8    Period  Weeks    Status  Achieved      OT LONG TERM GOAL #3   Title  Patient will complete simple housekeeping and laundry from ambulatory level without physical assistance    Time  8    Period  Weeks    Status  Achieved      OT LONG TERM GOAL #4   Title  Patient will demonstrate improved dynamic stand balance to stand without UE support for greater than 15 min while completing familiar functional task.      Time  8    Period  Weeks    Status  Revised      OT LONG TERM GOAL #5   Title  Pt will be mod I with aquatic based HEP for community wellness     Status  New            Plan - 09/06/18 2123    Clinical Impression Statement  Pt continues to make excellent progress toward goals and reports she is now walking at times at home with no device and her activity tolerance has also greatly improved.     Occupational Profile and client history currently impacting functional performance  Patient is married, worked full time as Glass blower/designer, very active in her church with excellent extended family support    Occupational performance deficits (Please refer to evaluation for details):  ADL's;IADL's;Work;Leisure    Rehab Potential  Excellent    OT Frequency  1x / week    OT Duration  4 weeks    OT Treatment/Interventions  Self-care/ADL training;Therapeutic exercise;Aquatic Therapy;Balance training;Therapeutic activities;Functional Mobility Training;DME and/or AE instruction;Energy conservation;Patient/family education;Neuromuscular education;Stair Training    Consulted and Agree with Plan of Care  Patient       Patient will benefit from skilled therapeutic  intervention in order to improve the following deficits and impairments:  Decreased knowledge of use of DME, Pain, Decreased mobility, Impaired sensation, Improper body mechanics, Decreased activity tolerance, Decreased endurance, Decreased strength, Increased muscle spasms, Improper spinal/pelvic alignment, Decreased balance, Difficulty walking  Visit Diagnosis: Muscle weakness (generalized)  Other lack of coordination  Unsteadiness on feet  Other disturbances of skin sensation  Abnormal posture    Problem List Patient Active Problem List   Diagnosis Date Noted  . Anxiety and depression 05/30/2018  . E. coli UTI 04/27/2018  . Acute blood loss anemia   . Acute deep vein thrombosis (DVT) of right femoral vein ([place])   . Hyponatremia   . Hypokalemia   . Hypomagnesemia   . Ileus, postoperative ([place])   . Thoracic myelopathy 03/21/2018  . Neurogenic bowel 03/21/2018  . Neurogenic bladder 03/21/2018  . Left wrist fracture, sequela 03/21/2018  . Numbness 02/15/2018  . Ataxic gait 02/15/2018  . Left leg weakness 02/15/2018  . Other fatigue 02/15/2018  . Urinary incontinence 02/15/2018  . Bowel incontinence 02/15/2018    Quay Burow, OTR/L 09/06/2018, 9:25 PM  [facility] [address] [place], [place], [address] Phone: [phone]   Fax:  [phone]  Name: TEKEISHA HAKIM MRN: [id number] Date of Birth: [birth year]

## 2018-09-11 ENCOUNTER — Ambulatory Visit: Payer: 59 | Admitting: Physical Therapy

## 2018-09-11 ENCOUNTER — Encounter: Payer: 59 | Admitting: Occupational Therapy

## 2018-09-11 DIAGNOSIS — R208 Other disturbances of skin sensation: Secondary | ICD-10-CM

## 2018-09-11 DIAGNOSIS — R278 Other lack of coordination: Secondary | ICD-10-CM

## 2018-09-11 DIAGNOSIS — M6281 Muscle weakness (generalized): Secondary | ICD-10-CM | POA: Diagnosis not present

## 2018-09-11 NOTE — Therapy (Signed)
[facility] [address] [place], [place], [address] Phone: [phone]   Fax:  [phone]  Physical Therapy Treatment  Patient Details  Name: Hannah Jefferson MRN: [id number] Date of Birth: September 01, 1956 Referring Provider (PT): Dr Naaman Plummer   Encounter Date: 09/11/2018  PT End of Session - 09/11/18 1237    Visit Number  17    Number of Visits  20   per updated POC   Date for PT Re-Evaluation  10/13/18   per updated POC (only need 5 weeks)   Authorization Type  [employer]: 20 visits PT and 20 visits OT    Authorization - Visit Number  17    Authorization - Number of Visits  20    PT Start Time  1100    PT Stop Time  1145    PT Time Calculation (min)  45 min    Equipment Utilized During Treatment  Gait belt    Activity Tolerance  Patient tolerated treatment well    Behavior During Therapy  [facility] for tasks assessed/performed       Past Medical History:  Diagnosis Date  . Chronic left hip pain   . GERD (gastroesophageal reflux disease)   . Gestational diabetes   . Hearing loss of both ears   . Hypertension   . Neuropathy    numbness/tingling from waist down  . Vision abnormalities     Past Surgical History:  Procedure Laterality Date  . IR IVC FILTER PLMT / S&I /IMG GUID/MOD SED  04/05/2018  . LAPAROSCOPIC CHOLECYSTECTOMY  1992  . REFRACTIVE SURGERY Bilateral 2004    There were no vitals filed for this visit.  Subjective Assessment - 09/11/18 1107    Subjective  Reports she has been performing exercises at home with no issues. Continues to experience LBP with shooting pain to L LE. Pt reports stretches alleviate her pain symptoms.     Patient is accompained by:  Family member    Pertinent History  B DVTs s/p 4/20019 surgery with IVC filter in place, hx of gestation DM only, HTN, Chronic L hip pain, scoliosis, hiatal hernia    Patient Stated Goals  I want to get back as much as possible.     Currently in Pain?  Yes     Pain Score  2     Pain Location  Back    Pain Orientation  Mid;Lower   Pain radiates to L LE. Pain reduced with stretching   Pain Descriptors / Indicators  --   Tightening   Pain Type  Chronic pain    Pain Radiating Towards  down L LE    Pain Onset  More than a month ago    Pain Frequency  Intermittent   In the AM when waking up 2/2 tightness      Skilled Physical Therapy Intervention:  Patient verbalizes and demonstrates understanding of the below HEP corner balance exercises with proper technique to improve safety with functional mobility.    Exercises  Romberg Stance with Head Rotation - 3 sets - 30 hold - 1x daily - 5x weekly  Romberg Stance with Head Nods - 3 sets - 30 hold - 1x daily - 5x weekly  Romberg Stance with Eyes Closed - 3 sets - 30 hold - 1x daily - 5x weekly  Tandem Stance with Head Rotation - 3 sets - 30 hold - 1x daily - 5x weekly  Romberg Stance on Foam Pad with Head Rotation - 3 sets -  30 hold - 1x daily - 5x weekly     [facility] Adult PT Treatment/Exercise - 09/11/18 1239      Balance   Balance Assessed  Yes      Dynamic Standing Balance   Compliant surfaces comments:  Pt performs dynamic standing balance on compliant surface while positioned on the ramp decline. Pt performs 30 second trial with feet apart performing horizontal head turns; alternating marching for 30 seconds; and backwards stepping for 30 seconds. Pt demonstrates x1 LOB episode posterior requiring therapist to provide min A for steadying      Exercises   Exercises  Knee/Hip;Lumbar      Lumbar Exercises: Stretches   Piriformis Stretch  Left;3 reps;30 seconds      Knee/Hip Exercises: Stretches   ITB Stretch  Left;3 reps;30 seconds             PT Education - 09/11/18 1237    Education Details  Therapist provided education regarded updated corner balance exercises and LE stretches with patient verbalizing and demonstrating understanding.     Person(s) Educated  Patient    Methods   Explanation;Demonstration    Comprehension  Verbalized understanding;Returned demonstration       PT Short Term Goals - 08/03/18 1513      PT SHORT TERM GOAL #1   Title  Pt will be IND in HEP to improve deficits listed above. TARGET DATE FOR ALL STGS: 08/02/18    Baseline  Pt reports ability to perform HEP independently- achieved on 9/19    Status  Achieved      PT SHORT TERM GOAL #2   Title  Pt will improve TUG time with LRAD to </=13.5sec. to decr. falls risk.     Baseline  TUG score improved to 12.97 seconds using RW- Achieved on 9/19     Status  Achieved      PT SHORT TERM GOAL #3   Title  Pt will improve gait speed to >/=2.25ft/sec, with LRAD to safely amb. in the community.     Baseline  Gait speed improved to 3.21 ft/sec using RW- Achieved on 9/19     Status  Achieved      PT SHORT TERM GOAL #4   Title  Pt will amb. 500' over even terrain, with LRAD at MOD I Level to improve safety during functional mobility.     Baseline  Pt demonstrates ability to ambulate over even terrain Mod I using RW for 500 ft- Achieved on 9/19    Status  Achieved      PT SHORT TERM GOAL #5   Title  Perform BERG and write STG and LTG    Baseline  30/56    Status  Achieved      PT SHORT TERM GOAL #6   Title  Pt will improve BERG balance test to 34/56 in order to indicate decreased fall risk.     Time  4    Period  Weeks    Status  Achieved   Berg Score 43/56- goal achieved on 9/19       PT Long Term Goals - 08/29/18 0958      PT LONG TERM GOAL #4   Title  Pt will improve BERG balance score to >/=51/56 in order to indicate decreased fall risk. (updated goal date: 10/13/18)    Baseline  47/56 on 10/14    Time  5    Period  Weeks    Status  Revised  PT LONG TERM GOAL #5   Title  Pt will perform floor recovery tranfser at mod I level in order to indicate safety in case of fall but also to ensure safety when playing in floor with family.     Baseline  S on 10/14    Time  5    Period   Weeks    Status  Revised      Additional Long Term Goals   Additional Long Term Goals  Yes      PT LONG TERM GOAL #6   Title  Pt will ambulate x 150' level indoor surfaces while negotiating turns, performing head motions and with environmental distractions at mod I level without AD in order to indicate improved functional mobility.     Time  5    Period  Weeks    Status  New      PT LONG TERM GOAL #7   Title  Pt will negotiate up/down 8 stairs with single rail in reciprocal pattern at mod I level in order to indicate improved home and community mobility as well as LE strength.     Time  5    Period  Weeks    Status  New            Plan - 09/11/18 1242    Clinical Impression Statement  Today's skilled session focused on implementing corner balance HEP and dynamic standing balance on compliant surfaces. Pt reports moderate increase in L TFL tightness during standing balance exercises with mild relief s/p supine stretches. Pt verbalized and demonstrated corner balance HEP with proper technique for safety considerations. Pt continues to demonstrate increased challenge performing balance on compliant surfaces and performing balance exercises with narrow BOS. Pt will continue to benefit from PT to address balance, strength, and functional mobility deficits.     PT Frequency  1x / week    PT Duration  Other (comment)   5 weeks   PT Treatment/Interventions  ADLs/Self Care Home Management;Biofeedback;Electrical Stimulation;Therapeutic exercise;Therapeutic activities;Functional mobility training;Stair training;Orthotic Fit/Training;Gait training;Patient/family education;DME Instruction;Neuromuscular re-education;Balance training;Vestibular;Dry needling    PT Next Visit Plan  DRY NEEDLE L TFL; session after TDN session check LTG.  D/C on visit 20.  balance strategies progress to resisted gait, hurdles forward/side stepping, ramp balance progress to compliant surface, forward stepping to 6 inch  or more block, ambulation over uneven surface, bosu ball balance with mini squats ball toss cone reaching    PT [place] and Agree with Plan of Care  Patient;Family member/caregiver    Family Member Consulted  son       Patient will benefit from skilled therapeutic intervention in order to improve the following deficits and impairments:  Abnormal gait, Decreased endurance, Impaired sensation, Decreased knowledge of use of DME, Decreased strength, Decreased balance, Decreased mobility, Decreased range of motion, Decreased coordination  Visit Diagnosis: Other lack of coordination  Muscle weakness (generalized)  Other disturbances of skin sensation     Problem List Patient Active Problem List   Diagnosis Date Noted  . Anxiety and depression 05/30/2018  . E. coli UTI 04/27/2018  . Acute blood loss anemia   . Acute deep vein thrombosis (DVT) of right femoral vein ([place])   . Hyponatremia   . Hypokalemia   . Hypomagnesemia   . Ileus, postoperative ([place])   . Thoracic myelopathy 03/21/2018  . Neurogenic bowel 03/21/2018  . Neurogenic bladder 03/21/2018  . Left wrist fracture, sequela  03/21/2018  . Numbness 02/15/2018  . Ataxic gait 02/15/2018  . Left leg weakness 02/15/2018  . Other fatigue 02/15/2018  . Urinary incontinence 02/15/2018  . Bowel incontinence 02/15/2018    Floreen Comber, SPT 09/11/2018, 12:45 PM  [facility] [address] [place], [place], [address] Phone: [phone]   Fax:  [phone]  Name: SHAWNIQUE MARIOTTI MRN: [id number] Date of Birth: 04-12-1956

## 2018-09-11 NOTE — Patient Instructions (Signed)
Access Code: [email or web address]  URL: [email or web address]  Date: 09/11/2018  Prepared by: Floreen Comber   Exercises  Romberg Stance with Head Rotation - 3 sets - 30 hold - 1x daily - 5x weekly  Romberg Stance with Head Nods - 3 sets - 30 hold - 1x daily - 5x weekly  Romberg Stance with Eyes Closed - 3 sets - 30 hold - 1x daily - 5x weekly  Tandem Stance with Head Rotation - 3 sets - 30 hold - 1x daily - 5x weekly  Romberg Stance on Foam Pad with Head Rotation - 3 sets - 30 hold - 1x daily - 5x weekly

## 2018-09-13 ENCOUNTER — Ambulatory Visit: Payer: 59 | Admitting: Occupational Therapy

## 2018-09-13 ENCOUNTER — Ambulatory Visit: Payer: 59 | Admitting: Physical Therapy

## 2018-09-13 ENCOUNTER — Encounter: Payer: Self-pay | Admitting: Occupational Therapy

## 2018-09-13 ENCOUNTER — Encounter: Payer: 59 | Admitting: Occupational Therapy

## 2018-09-13 DIAGNOSIS — R2681 Unsteadiness on feet: Secondary | ICD-10-CM

## 2018-09-13 DIAGNOSIS — R208 Other disturbances of skin sensation: Secondary | ICD-10-CM

## 2018-09-13 DIAGNOSIS — M6281 Muscle weakness (generalized): Secondary | ICD-10-CM

## 2018-09-13 DIAGNOSIS — R278 Other lack of coordination: Secondary | ICD-10-CM

## 2018-09-13 DIAGNOSIS — R293 Abnormal posture: Secondary | ICD-10-CM

## 2018-09-13 NOTE — Therapy (Signed)
[facility] [address] [place], [place], [address] Phone: [phone]   Fax:  [phone]  Occupational Therapy Treatment  Patient Details  Name: Hannah Jefferson MRN: [id number] Date of Birth: 07-Jan-1956 No data recorded  Encounter Date: 09/13/2018  OT End of Session - 09/13/18 2116    Visit Number  18    Number of Visits  20    Date for OT Re-Evaluation  10/11/18    Authorization Type  [employer] 20 visit limit OT    Authorization - Visit Number  18    Authorization - Number of Visits  20    OT Start Time  1417    OT Stop Time  1500    OT Time Calculation (min)  43 min    Activity Tolerance  Patient tolerated treatment well       Past Medical History:  Diagnosis Date  . Chronic left hip pain   . GERD (gastroesophageal reflux disease)   . Gestational diabetes   . Hearing loss of both ears   . Hypertension   . Neuropathy    numbness/tingling from waist down  . Vision abnormalities     Past Surgical History:  Procedure Laterality Date  . IR IVC FILTER PLMT / S&I /IMG GUID/MOD SED  04/05/2018  . LAPAROSCOPIC CHOLECYSTECTOMY  1992  . REFRACTIVE SURGERY Bilateral 2004    There were no vitals filed for this visit.  Subjective Assessment - 09/13/18 2113    Subjective   I feel very comfortable walking around in my house now without a device however I still feel really unstable outside and especially in crowds.     Patient is accompained by:  --   friend   Pertinent History  surgery to remove tumor on spine 03/13/18    Limitations  none    Currently in Pain?  No/denies         Patient seen for aquatic therapy today.  Treatment took place in water 2.5-4 feet deep depending upon activity.  Pt entered the pool via steps using 1 railing today with cues for postural alignment, decreased reliance on UE 's and cues to use LE's to lower to next step.  Utilize steps for step ups using RLE as power leg (12 reps) initially  with facilitation for normal movement pattern (shifting weight forward over leg and then powering up vs pulling with UE's and weight behind LE's). With practice and repetition, pt able to progress to only supervision and cues for both forward step up and backward step down using RLE as power leg.  Treatment also focused on postural alignment and control as well as dynamic standing balance with rapid paced walking in deeper water for increased resistance and challenge to balance. Therapist walked in front of patient to create increased unpredictable resistance as well.  Addressed lower trunk initiated lateral weight shifting while sitting on noodle with UE support progressing to sitting on noodle with no UE support and using adjustments at trunk to maintain balance.  Addressed LE strengthening using yellow band for RLE hip extension and abduction ) 12 reps x2) while maintaining active core and appropriate postural alignment.  Addressed dynamic standing balance using walking on ramp forward and backward - initially pt required 1 UE support however progressed to no UE support and intermittent contact guard.  Addressed functional ambulation with more narrow BOS and use of dumb bells for light UE support.  Pt exited pool using 2 hand rails and using  RLE as power leg with only min vc's and supervision.                      OT Short Term Goals - 09/13/18 2114      OT SHORT TERM GOAL #1   Title  Patient will complete a home exercise program designed to improve grip strength - due 08/04/18    Status  Achieved      OT SHORT TERM GOAL #2   Title  Patient wil stand in shower from tub transfer bench in shower to wash periarea with supervision    Status  Achieved      OT [place] #3   Title  Patient will demonstrate understanding of graduated driving program, and/or driving resources to prepare for return to driving    Status  Achieved      OT [place] #4   Title  Patient will don  socks and shoes, and fasten/tie shoes with modified independence    Status  Achieved        OT Long Term Goals - 09/13/18 2114      OT LONG TERM GOAL #1   Title  Patient will demonstrate sufficient activity tolerance and functional mobility to return work 4-6 hours per day following daily self care skills. - 09/29/2018    Time  8    Period  Weeks    Status  Achieved   pt has not returned to work however is able to participate in 4-6 hours of activity following daily self care skills     OT [place] #2   Title  Patient will prepare a hot meal consisting of at least two items with no more than intermittent min assist    Time  8    Period  Weeks    Status  Achieved      OT LONG TERM GOAL #3   Title  Patient will complete simple housekeeping and laundry from ambulatory level without physical assistance    Time  8    Period  Weeks    Status  Achieved      OT LONG TERM GOAL #4   Title  Patient will demonstrate improved dynamic stand balance to stand without UE support for greater than 15 min while completing familiar functional task.      Time  8    Period  Weeks    Status  Achieved      OT LONG TERM GOAL #5   Title  Pt will be mod I with aquatic based HEP for community wellness     Status  On-going            Plan - 09/13/18 2115    Clinical Impression Statement  Pt continues to demonstate improved postural alignment and control as well as improve activity tolerance.     Occupational Profile and client history currently impacting functional performance  Patient is married, worked full time as Glass blower/designer, very active in her church with excellent extended family support    Occupational performance deficits (Please refer to evaluation for details):  ADL's;IADL's;Work;Leisure    Rehab Potential  Excellent    OT Frequency  1x / week    OT Duration  4 weeks    OT Treatment/Interventions  Self-care/ADL training;Therapeutic exercise;Aquatic Therapy;Balance  training;Therapeutic activities;Functional Mobility Training;DME and/or AE instruction;Energy conservation;Patient/family education;Neuromuscular education;Stair Training    Plan  Aquatic therapy to address functional mobility, standing /activity  tolerance,  decrease UE reliance in standing, postural alignment and control     Consulted and Agree with Plan of Care  Patient       Patient will benefit from skilled therapeutic intervention in order to improve the following deficits and impairments:  Decreased knowledge of use of DME, Pain, Decreased mobility, Impaired sensation, Improper body mechanics, Decreased activity tolerance, Decreased endurance, Decreased strength, Increased muscle spasms, Improper spinal/pelvic alignment, Decreased balance, Difficulty walking  Visit Diagnosis: Other lack of coordination  Muscle weakness (generalized)  Other disturbances of skin sensation  Unsteadiness on feet  Abnormal posture    Problem List Patient Active Problem List   Diagnosis Date Noted  . Anxiety and depression 05/30/2018  . E. coli UTI 04/27/2018  . Acute blood loss anemia   . Acute deep vein thrombosis (DVT) of right femoral vein ([place])   . Hyponatremia   . Hypokalemia   . Hypomagnesemia   . Ileus, postoperative ([place])   . Thoracic myelopathy 03/21/2018  . Neurogenic bowel 03/21/2018  . Neurogenic bladder 03/21/2018  . Left wrist fracture, sequela 03/21/2018  . Numbness 02/15/2018  . Ataxic gait 02/15/2018  . Left leg weakness 02/15/2018  . Other fatigue 02/15/2018  . Urinary incontinence 02/15/2018  . Bowel incontinence 02/15/2018    Quay Burow 09/13/2018, 9:18 PM  [facility] [address] [place], [place], [address] Phone: [phone]   Fax:  [phone]  Name: Hannah Jefferson MRN: [id number] Date of Birth: 30-Jul-1956

## 2018-09-18 DIAGNOSIS — I10 Essential (primary) hypertension: Secondary | ICD-10-CM | POA: Diagnosis not present

## 2018-09-18 DIAGNOSIS — Z23 Encounter for immunization: Secondary | ICD-10-CM | POA: Diagnosis not present

## 2018-09-18 DIAGNOSIS — K219 Gastro-esophageal reflux disease without esophagitis: Secondary | ICD-10-CM | POA: Diagnosis not present

## 2018-09-18 DIAGNOSIS — E782 Mixed hyperlipidemia: Secondary | ICD-10-CM | POA: Diagnosis not present

## 2018-09-18 DIAGNOSIS — Z0000 Encounter for general adult medical examination without abnormal findings: Secondary | ICD-10-CM | POA: Diagnosis not present

## 2018-09-19 ENCOUNTER — Encounter: Payer: Self-pay | Admitting: Rehabilitation

## 2018-09-19 ENCOUNTER — Ambulatory Visit: Payer: 59 | Attending: Family Medicine | Admitting: Rehabilitation

## 2018-09-19 DIAGNOSIS — R278 Other lack of coordination: Secondary | ICD-10-CM | POA: Insufficient documentation

## 2018-09-19 DIAGNOSIS — M6281 Muscle weakness (generalized): Secondary | ICD-10-CM

## 2018-09-19 DIAGNOSIS — R2689 Other abnormalities of gait and mobility: Secondary | ICD-10-CM | POA: Diagnosis present

## 2018-09-19 DIAGNOSIS — R293 Abnormal posture: Secondary | ICD-10-CM | POA: Diagnosis present

## 2018-09-19 DIAGNOSIS — R2681 Unsteadiness on feet: Secondary | ICD-10-CM

## 2018-09-19 DIAGNOSIS — R208 Other disturbances of skin sensation: Secondary | ICD-10-CM | POA: Diagnosis present

## 2018-09-19 NOTE — Therapy (Signed)
[facility] [address] [place], [place], [address] Phone: [phone]   Fax:  [phone]  Physical Therapy Treatment  Patient Details  Name: Hannah Jefferson MRN: [id number] Date of Birth: 07-19-1956 Referring Provider (PT): Dr Naaman Plummer   Encounter Date: 09/19/2018  PT End of Session - 09/19/18 1304    Visit Number  18    Number of Visits  20   per updated POC   Date for PT Re-Evaluation  10/13/18   per updated POC (only need 5 weeks)   Authorization Type  U[employer]: 20 visits PT and 20 visits OT    Authorization - Visit Number  18    Authorization - Number of Visits  20    PT Start Time  1100    PT Stop Time  1145    PT Time Calculation (min)  45 min    Equipment Utilized During Treatment  Gait belt    Activity Tolerance  Patient tolerated treatment well    Behavior During Therapy  WFL for tasks assessed/performed       Past Medical History:  Diagnosis Date  . Chronic left hip pain   . GERD (gastroesophageal reflux disease)   . Gestational diabetes   . Hearing loss of both ears   . Hypertension   . Neuropathy    numbness/tingling from waist down  . Vision abnormalities     Past Surgical History:  Procedure Laterality Date  . IR IVC FILTER PLMT / S&I /IMG GUID/MOD SED  04/05/2018  . LAPAROSCOPIC CHOLECYSTECTOMY  1992  . REFRACTIVE SURGERY Bilateral 2004    There were no vitals filed for this visit.  Subjective Assessment - 09/19/18 1107    Subjective  Reports everything has been going well at home. Reports she has been exercising and performing her HEP at home. Pt reports hyper sensitivity to left abdominal region reporting occassionally the region hurts when her clothing rubs against it.     Patient is accompained by:  Family member    Pertinent History  B DVTs s/p 4/20019 surgery with IVC filter in place, hx of gestation DM only, HTN, Chronic L hip pain, scoliosis, hiatal hernia    Patient Stated Goals   I want to get back as much as possible.     Currently in Pain?  No/denies        [facility] Adult PT Treatment/Exercise - 09/19/18 1255      Ambulation/Gait   Ambulation/Gait  Yes    Ambulation/Gait Assistance  5: Supervision;4: Min guard    Ambulation/Gait Assistance Details  Pt performs x1 ambulation trial on level surfaces with no AD and therapist cueing to perform reciprocal UE arm swing to optimize gait pattern. Pt progresses to x1 ambulation trial therapist providing spontaneous pertubations to elicit stepping strategies to maintain balance. Therapist provided min guard throughout activity duration.      Ambulation Distance (Feet)  115 Feet   x2 trials with rest break in between   Assistive device  None    Gait Pattern  Step-through pattern;Decreased dorsiflexion - right;Decreased dorsiflexion - left;Decreased stride length;Wide base of support    Ambulation Surface  Level;Indoor      Therapeutic Activites    Therapeutic Activities  Other Therapeutic Activities    Other Therapeutic Activities  Pt performs mat> floor transfer with supervision with patient demonstrating good recall with proper technique and sequencing. Pt then requires verbal cues for technique to perform floor> mat transfer with therapist providing min  A to boost patient on to mat 2/2 to increase fatigue s/p core stabilization exercises.       Exercises   Exercises  Knee/Hip;Lumbar    Other Exercises   Pt performs core and hip stabilization exercise in half kneeling. Pt performs 2x10 reps of diagonal "chops" against therapist applying manual resistance with band. Pt demonstrates ability to perform 2x10 reps with her R LE posterior and L LE leading. Pt then transitions to L LE posterior and R LE leading and is able to perform 1x10 reps with therapist assisting with pelvic stabilization to prevent LOB. Pt requires min A to transition into tall kneeling positions and verbal cues for proper technique.       Lumbar Exercises:  Stretches   Piriformis Stretch  Left;3 reps;30 seconds    Other Lumbar Stretch Exercise  Lumbar R sided rotation for 2x30 sec holds to improve ROM and reduce tightness to region             PT Education - 09/19/18 1303    Education Details  Therapist provided education regarding lower back rotation exercises and piriformis stretching to reduce pain and improve flexibility.     Person(s) Educated  Patient    Methods  Explanation    Comprehension  Verbalized understanding       PT Short Term Goals - 08/03/18 1513      PT SHORT TERM GOAL #1   Title  Pt will be IND in HEP to improve deficits listed above. TARGET DATE FOR ALL STGS: 08/02/18    Baseline  Pt reports ability to perform HEP independently- achieved on 9/19    Status  Achieved      PT SHORT TERM GOAL #2   Title  Pt will improve TUG time with LRAD to </=13.5sec. to decr. falls risk.     Baseline  TUG score improved to 12.97 seconds using RW- Achieved on 9/19     Status  Achieved      PT SHORT TERM GOAL #3   Title  Pt will improve gait speed to >/=2.54ft/sec, with LRAD to safely amb. in the community.     Baseline  Gait speed improved to 3.21 ft/sec using RW- Achieved on 9/19     Status  Achieved      PT SHORT TERM GOAL #4   Title  Pt will amb. 500' over even terrain, with LRAD at MOD I Level to improve safety during functional mobility.     Baseline  Pt demonstrates ability to ambulate over even terrain Mod I using RW for 500 ft- Achieved on 9/19    Status  Achieved      PT SHORT TERM GOAL #5   Title  Perform BERG and write STG and LTG    Baseline  30/56    Status  Achieved      PT SHORT TERM GOAL #6   Title  Pt will improve BERG balance test to 34/56 in order to indicate decreased fall risk.     Time  4    Period  Weeks    Status  Achieved   Berg Score 43/56- goal achieved on 9/19       PT Long Term Goals - 08/29/18 0958      PT LONG TERM GOAL #4   Title  Pt will improve BERG balance score to >/=51/56  in order to indicate decreased fall risk. (updated goal date: 10/13/18)    Baseline  47/56 on 10/14  Time  5    Period  Weeks    Status  Revised      PT LONG TERM GOAL #5   Title  Pt will perform floor recovery tranfser at mod I level in order to indicate safety in case of fall but also to ensure safety when playing in floor with family.     Baseline  S on 10/14    Time  5    Period  Weeks    Status  Revised      Additional Long Term Goals   Additional Long Term Goals  Yes      PT LONG TERM GOAL #6   Title  Pt will ambulate x 150' level indoor surfaces while negotiating turns, performing head motions and with environmental distractions at mod I level without AD in order to indicate improved functional mobility.     Time  5    Period  Weeks    Status  New      PT LONG TERM GOAL #7   Title  Pt will negotiate up/down 8 stairs with single rail in reciprocal pattern at mod I level in order to indicate improved home and community mobility as well as LE strength.     Time  5    Period  Weeks    Status  New            Plan - 09/19/18 1305    Clinical Impression Statement  Skilled session focused on core stabilization in half kneeling position, floor recovery, and dynamic gait to elicit balance strategies. Patient tolerated session well and was able to perform core stabilization exercises in half kneeling with ability to tolerate therapist providing resistance for increased core activation. Pt demonstrates increased challenge with floor>mat recovery 2/2 to fatigue requiring therapist to provide min A for boosting. Pt demonstrates good utilization of stepping strategy during resisted gait to prevent LOB episodes. Next session will focus on assessing LTG's and finalizing HEP.     PT Frequency  1x / week    PT Duration  Other (comment)   5 weeks   PT Treatment/Interventions  ADLs/Self Care Home Management;Biofeedback;Electrical Stimulation;Therapeutic exercise;Therapeutic  activities;Functional mobility training;Stair training;Orthotic Fit/Training;Gait training;Patient/family education;DME Instruction;Neuromuscular re-education;Balance training;Vestibular;Dry needling    PT Next Visit Plan  CHECK LTG'S, FINALIZE HEP    PT Home Exercise Plan  [id number]     Consulted and Agree with Plan of Care  Patient       Patient will benefit from skilled therapeutic intervention in order to improve the following deficits and impairments:  Abnormal gait, Decreased endurance, Impaired sensation, Decreased knowledge of use of DME, Decreased strength, Decreased balance, Decreased mobility, Decreased range of motion, Decreased coordination  Visit Diagnosis: Muscle weakness (generalized)  Other abnormalities of gait and mobility     Problem List Patient Active Problem List   Diagnosis Date Noted  . Anxiety and depression 05/30/2018  . E. coli UTI 04/27/2018  . Acute blood loss anemia   . Acute deep vein thrombosis (DVT) of right femoral vein ([place])   . Hyponatremia   . Hypokalemia   . Hypomagnesemia   . Ileus, postoperative ([place])   . Thoracic myelopathy 03/21/2018  . Neurogenic bowel 03/21/2018  . Neurogenic bladder 03/21/2018  . Left wrist fracture, sequela 03/21/2018  . Numbness 02/15/2018  . Ataxic gait 02/15/2018  . Left leg weakness 02/15/2018  . Other fatigue 02/15/2018  . Urinary incontinence 02/15/2018  . Bowel incontinence 02/15/2018    Floreen Comber,  SPT 09/19/2018, 1:09 PM  [facility] [address] [place], [place], [address] Phone: [phone]   Fax:  [phone]  Name: Hannah Jefferson MRN: [id number] Date of Birth: 12/01/1955

## 2018-09-20 ENCOUNTER — Ambulatory Visit: Payer: 59 | Admitting: Occupational Therapy

## 2018-09-20 ENCOUNTER — Encounter: Payer: Self-pay | Admitting: Occupational Therapy

## 2018-09-20 DIAGNOSIS — R208 Other disturbances of skin sensation: Secondary | ICD-10-CM

## 2018-09-20 DIAGNOSIS — R2681 Unsteadiness on feet: Secondary | ICD-10-CM

## 2018-09-20 DIAGNOSIS — R293 Abnormal posture: Secondary | ICD-10-CM

## 2018-09-20 DIAGNOSIS — M6281 Muscle weakness (generalized): Secondary | ICD-10-CM

## 2018-09-20 DIAGNOSIS — R278 Other lack of coordination: Secondary | ICD-10-CM

## 2018-09-20 NOTE — Therapy (Signed)
[facility] [address] [place], [place], [address] Phone: [phone]   Fax:  [phone]  Occupational Therapy Treatment  Patient Details  Name: Hannah Jefferson MRN: [id number] Date of Birth: 02/06/1956 No data recorded  Encounter Date: 09/20/2018  OT End of Session - 09/20/18 2121    Visit Number  19    Number of Visits  20    Date for OT Re-Evaluation  10/11/18    Authorization Type  [employer] 20 visit limit OT    Authorization - Visit Number  19    Authorization - Number of Visits  20    OT Start Time  1416    OT Stop Time  1500    OT Time Calculation (min)  44 min    Activity Tolerance  Patient tolerated treatment well       Past Medical History:  Diagnosis Date  . Chronic left hip pain   . GERD (gastroesophageal reflux disease)   . Gestational diabetes   . Hearing loss of both ears   . Hypertension   . Neuropathy    numbness/tingling from waist down  . Vision abnormalities     Past Surgical History:  Procedure Laterality Date  . IR IVC FILTER PLMT / S&I /IMG GUID/MOD SED  04/05/2018  . LAPAROSCOPIC CHOLECYSTECTOMY  1992  . REFRACTIVE SURGERY Bilateral 2004    There were no vitals filed for this visit.  Subjective Assessment - 09/20/18 2117    Patient is accompained by:  --   friend   Pertinent History  surgery to remove tumor on spine 03/13/18    Limitations  none    Currently in Pain?  Yes    Pain Score  2     Pain Location  Back    Pain Orientation  Right;Mid    Pain Descriptors / Indicators  Tightness    Pain Type  Chronic pain    Pain Onset  More than a month ago    Pain Frequency  Intermittent    Aggravating Factors   It feels like I have small lump there (pt had soft tissue knot)    Pain Relieving Factors  Tylenol       Patient seen for aquatic therapy today.  Treatment took place in water 2.5-4 feet deep depending upon activity.  Pt entered the pool via steps using both handrails  with only light UE support for balance and demonstrating improved postural alignment without cues.  Pt reported pain and tightness in R mid back - pt with significant soft tissue tightness as well as soft tissue knot.  Utilized aquastretch technique to decrease tightness, improve length of soft tissue and decrease pain as well as improve mobility of trunk. Pt reported no pain after intervention.  Treatment also focused on use of functional ambulation using aqua jogger in front to facilitate thoracic extension as well using stops and starts to employ principal of inertia in water.  Pt with improved ability to maintain balance with stops and starts as well as to initiate functional ambulation against increased resistance.  Addressed LE strengthening for RLE using yellow band for resistance - pt able to maintain excellent postural alignment today for hip extension as well as hip abduction without cues even with increased resistance (15 reps x2).  Addressed core strength using pushups on stairs - worked in more shallow water today to decreased buoyancy assistance from water - pt able to complete 10 reps, rest then 7 reps.  Addressed side plank on elbow with cues for active core while reaching up with opposite hand. Addressed dynamic standing balance using light dumb bells for UE support while walking on blue line on floor of pool (more narrow BOS).  Pt's performance improves with repetition. Discussed providing HEP at next session as that will be pt's last session for this year due to visit limitations.  Also continue to educate pt on basic water principles so that pt will be able to modify HEP independently.                       OT Short Term Goals - 09/20/18 2119      OT SHORT TERM GOAL #1   Title  Patient will complete a home exercise program designed to improve grip strength - due 08/04/18    Status  Achieved      OT SHORT TERM GOAL #2   Title  Patient wil stand in shower from tub  transfer bench in shower to wash periarea with supervision    Status  Achieved      OT [place] #3   Title  Patient will demonstrate understanding of graduated driving program, and/or driving resources to prepare for return to driving    Status  Achieved      OT [place] #4   Title  Patient will don socks and shoes, and fasten/tie shoes with modified independence    Status  Achieved        OT Long Term Goals - 09/20/18 2119      OT LONG TERM GOAL #1   Title  Patient will demonstrate sufficient activity tolerance and functional mobility to return work 4-6 hours per day following daily self care skills. - 09/29/2018    Time  8    Period  Weeks    Status  Achieved   pt has not returned to work however is able to participate in 4-6 hours of activity following daily self care skills     OT [place] #2   Title  Patient will prepare a hot meal consisting of at least two items with no more than intermittent min assist    Time  8    Period  Weeks    Status  Achieved      OT LONG TERM GOAL #3   Title  Patient will complete simple housekeeping and laundry from ambulatory level without physical assistance    Time  8    Period  Weeks    Status  Achieved      OT LONG TERM GOAL #4   Title  Patient will demonstrate improved dynamic stand balance to stand without UE support for greater than 15 min while completing familiar functional task.      Time  8    Period  Weeks    Status  Achieved      OT LONG TERM GOAL #5   Title  Pt will be mod I with aquatic based HEP for community wellness     Status  On-going            Plan - 09/20/18 2120    Clinical Impression Statement  Pt continues to progress with mobility and balance. Pt reports this week she felt more stable out in the community    Occupational Profile and client history currently impacting functional performance  Patient is married, worked full time as Glass blower/designer, very active in her church  with  excellent extended family support    Occupational performance deficits (Please refer to evaluation for details):  ADL's;IADL's;Work;Leisure    Rehab Potential  Excellent    OT Frequency  1x / week    OT Duration  4 weeks    OT Treatment/Interventions  Self-care/ADL training;Therapeutic exercise;Aquatic Therapy;Balance training;Therapeutic activities;Functional Mobility Training;DME and/or AE instruction;Energy conservation;Patient/family education;Neuromuscular education;Stair Training    Plan  Finalize HEP for Aquatic therapy to address functional mobility, standing /activity  tolerance, decrease UE reliance in standing, postural alignment and control     Consulted and Agree with Plan of Care  Patient       Patient will benefit from skilled therapeutic intervention in order to improve the following deficits and impairments:  Decreased knowledge of use of DME, Pain, Decreased mobility, Impaired sensation, Improper body mechanics, Decreased activity tolerance, Decreased endurance, Decreased strength, Increased muscle spasms, Improper spinal/pelvic alignment, Decreased balance, Difficulty walking  Visit Diagnosis: Muscle weakness (generalized)  Unsteadiness on feet  Abnormal posture  Other lack of coordination  Other disturbances of skin sensation    Problem List Patient Active Problem List   Diagnosis Date Noted  . Anxiety and depression 05/30/2018  . E. coli UTI 04/27/2018  . Acute blood loss anemia   . Acute deep vein thrombosis (DVT) of right femoral vein ([place])   . Hyponatremia   . Hypokalemia   . Hypomagnesemia   . Ileus, postoperative ([place])   . Thoracic myelopathy 03/21/2018  . Neurogenic bowel 03/21/2018  . Neurogenic bladder 03/21/2018  . Left wrist fracture, sequela 03/21/2018  . Numbness 02/15/2018  . Ataxic gait 02/15/2018  . Left leg weakness 02/15/2018  . Other fatigue 02/15/2018  . Urinary incontinence 02/15/2018  . Bowel incontinence 02/15/2018     Quay Burow, OTR/L 09/20/2018, 9:22 PM  [facility] [address] [place], [place], [address] Phone: [phone]   Fax:  [phone]  Name: MAKENLEY SHIMP MRN: [id number] Date of Birth: 1956-02-22

## 2018-09-25 ENCOUNTER — Ambulatory Visit: Payer: 59 | Admitting: Rehabilitation

## 2018-09-25 ENCOUNTER — Encounter: Payer: Self-pay | Admitting: Rehabilitation

## 2018-09-25 DIAGNOSIS — R2681 Unsteadiness on feet: Secondary | ICD-10-CM

## 2018-09-25 DIAGNOSIS — R2689 Other abnormalities of gait and mobility: Secondary | ICD-10-CM

## 2018-09-25 DIAGNOSIS — M6281 Muscle weakness (generalized): Secondary | ICD-10-CM

## 2018-09-25 NOTE — Therapy (Signed)
[facility] [address] [place], [place], [address] Phone: [phone]   Fax:  [phone]  Physical Therapy Treatment  Patient Details  Name: Hannah Jefferson MRN: [id number] Date of Birth: 1956/04/30 Referring Provider (PT): Dr Naaman Plummer   Encounter Date: 09/25/2018  PT End of Session - 09/25/18 1153    Visit Number  19    Number of Visits  20   per updated POC   Date for PT Re-Evaluation  10/13/18   per updated POC (only need 5 weeks)   Authorization Type  UHC: 20 visits PT and 20 visits OT    Authorization - Visit Number  19    Authorization - Number of Visits  20    PT Start Time  1100    PT Stop Time  1145    PT Time Calculation (min)  45 min    Activity Tolerance  Patient tolerated treatment well    Behavior During Therapy  [facility] for tasks assessed/performed       Past Medical History:  Diagnosis Date  . Chronic left hip pain   . GERD (gastroesophageal reflux disease)   . Gestational diabetes   . Hearing loss of both ears   . Hypertension   . Neuropathy    numbness/tingling from waist down  . Vision abnormalities     Past Surgical History:  Procedure Laterality Date  . IR IVC FILTER PLMT / S&I /IMG GUID/MOD SED  04/05/2018  . LAPAROSCOPIC CHOLECYSTECTOMY  1992  . REFRACTIVE SURGERY Bilateral 2004    There were no vitals filed for this visit.  Subjective Assessment - 09/25/18 1107    Subjective  Reports her L hip is bothering her today and she is experiencing bilateral LE cramping and takes a muscle relaxer for sx relief. Continues to perform HEP at home with no issues.     Patient is accompained by:  Family member    Pertinent History  B DVTs s/p 4/20019 surgery with IVC filter in place, hx of gestation DM only, HTN, Chronic L hip pain, scoliosis, hiatal hernia    Patient Stated Goals  I want to get back as much as possible.     Currently in Pain?  Yes    Pain Score  4     Pain Location  Hip    Pain Orientation  Left    Pain Descriptors / Indicators  Tightness    Pain Type  Chronic pain    Pain Onset  More than a month ago    Pain Frequency  Intermittent    Aggravating Factors   when standing for prolonged periods     Pain Relieving Factors  Tylenol and recently took a muscle relaxer and believes it is helping.        Skilled Physical Therapy Intervention;  Exercises  Romberg Stance with Head Rotation - 3 sets - 30 hold - 1x daily - 5x weekly  Romberg Stance with Eyes Closed - 3 sets - 30 hold - 1x daily - 5x weekly  Tandem Stance with Head Rotation - 3 sets - 30 hold - 1x daily - 5x weekly  Romberg Stance on Foam Pad with Head Rotation - 3 sets - 30 hold - 1x daily - 5x weekly  Supine ITB Stretch with Strap - 3 sets - 30 hold - 1x daily - 7x weekly  Supine Piriformis Stretch with Leg Straight - 3 sets - 30 hold - 1x daily - 7x weekly  Quadruped Alternating Leg Extensions - 10 reps - 2 sets - 1x daily - 5x weekly  Quadruped Fire Hydrant - 10 reps - 2 sets - 1x daily - 5x weekly  Single Leg Sit to Stand with Arms Crossed - 10 reps - 2 sets - 1x daily - 5x weekly      PT Education - 09/25/18 1152    Education Details  Therapist provided education regarded revised strengthening and corner balance exercises to continue improving functional mobility s/p upcoming d/c.     Person(s) Educated  Patient;Child(ren)    Methods  Explanation    Comprehension  Verbalized understanding;Returned demonstration       PT Short Term Goals - 08/03/18 1513      PT SHORT TERM GOAL #1   Title  Pt will be IND in HEP to improve deficits listed above. TARGET DATE FOR ALL STGS: 08/02/18    Baseline  Pt reports ability to perform HEP independently- achieved on 9/19    Status  Achieved      PT SHORT TERM GOAL #2   Title  Pt will improve TUG time with LRAD to </=13.5sec. to decr. falls risk.     Baseline  TUG score improved to 12.97 seconds using RW- Achieved on 9/19     Status  Achieved       PT SHORT TERM GOAL #3   Title  Pt will improve gait speed to >/=2.6ft/sec, with LRAD to safely amb. in the community.     Baseline  Gait speed improved to 3.21 ft/sec using RW- Achieved on 9/19     Status  Achieved      PT SHORT TERM GOAL #4   Title  Pt will amb. 500' over even terrain, with LRAD at MOD I Level to improve safety during functional mobility.     Baseline  Pt demonstrates ability to ambulate over even terrain Mod I using RW for 500 ft- Achieved on 9/19    Status  Achieved      PT SHORT TERM GOAL #5   Title  Perform BERG and write STG and LTG    Baseline  30/56    Status  Achieved      PT SHORT TERM GOAL #6   Title  Pt will improve BERG balance test to 34/56 in order to indicate decreased fall risk.     Time  4    Period  Weeks    Status  Achieved   Berg Score 43/56- goal achieved on 9/19       PT Long Term Goals - 09/25/18 1203      PT LONG TERM GOAL #1   Title  Pt will amb. 1000' over even/paved terrain with LRAD at MOD I level to improve functional mobility. TARGET DATE FOR ALL LTGS: 08/30/18    Baseline  10/14: Patient ambulates 1000' over paved terrain with her cane at Mod I level     Status  Achieved      PT LONG TERM GOAL #2   Title  Trial amb. without AD when appropriate and write goal as indicated.     Status  Achieved      PT LONG TERM GOAL #3   Title  Pt will report no falls in the last 4 weeks to improve safety.     Baseline  10/14: Patient reports no falls in the last 4 weeks    Status  Achieved      PT LONG TERM GOAL #4  Title  Pt will improve BERG balance score to >/=51/56 in order to indicate decreased fall risk. (updated goal date: 10/13/18)    Baseline  47/56 on 10/14    Time  5    Period  Weeks    Status  Revised      PT LONG TERM GOAL #5   Title  Pt will perform floor recovery tranfser at mod I level in order to indicate safety in case of fall but also to ensure safety when playing in floor with family.     Baseline  S on 10/14     Time  5    Period  Weeks    Status  Revised      PT LONG TERM GOAL #6   Title  Pt will ambulate x 150' level indoor surfaces while negotiating turns, performing head motions and with environmental distractions at mod I level without AD in order to indicate improved functional mobility.     Time  5    Period  Weeks    Status  New      PT LONG TERM GOAL #7   Title  Pt will negotiate up/down 8 stairs with single rail in reciprocal pattern at mod I level in order to indicate improved home and community mobility as well as LE strength.     Time  5    Period  Weeks    Status  New            Plan - 09/25/18 1112    Clinical Impression Statement  Skilled session focused on updating and revising patient's HEP in preparation for upcoming d/c. Pt tolerated session well and is able to demonstrate all revised exercises to perform at home with proper technique. Pt does demonstrate x1 LOB episode when ambulating on level surfaces while transitioning between activites with ability to recover utilizing stepping strategy. Next session will focus on assessing all LTG's and discharging patient from physical therapy.     PT Frequency  1x / week    PT Duration  Other (comment)   5 weeks   PT Treatment/Interventions  ADLs/Self Care Home Management;Biofeedback;Electrical Stimulation;Therapeutic exercise;Therapeutic activities;Functional mobility training;Stair training;Orthotic Fit/Training;Gait training;Patient/family education;DME Instruction;Neuromuscular re-education;Balance training;Vestibular;Dry needling    PT Next Visit Plan  CHECK [place] and Agree with Plan of Care  Patient    Family Member Consulted  son       Patient will benefit from skilled therapeutic intervention in order to improve the following deficits and impairments:  Abnormal gait, Decreased endurance, Impaired sensation, Decreased knowledge of use of DME, Decreased strength, Decreased  balance, Decreased mobility, Decreased range of motion, Decreased coordination  Visit Diagnosis: Muscle weakness (generalized)  Other abnormalities of gait and mobility     Problem List Patient Active Problem List   Diagnosis Date Noted  . Anxiety and depression 05/30/2018  . E. coli UTI 04/27/2018  . Acute blood loss anemia   . Acute deep vein thrombosis (DVT) of right femoral vein ([place])   . Hyponatremia   . Hypokalemia   . Hypomagnesemia   . Ileus, postoperative ([place])   . Thoracic myelopathy 03/21/2018  . Neurogenic bowel 03/21/2018  . Neurogenic bladder 03/21/2018  . Left wrist fracture, sequela 03/21/2018  . Numbness 02/15/2018  . Ataxic gait 02/15/2018  . Left leg weakness 02/15/2018  . Other fatigue 02/15/2018  . Urinary incontinence 02/15/2018  . Bowel incontinence 02/15/2018  Floreen Comber, SPT 09/25/2018, 12:09 PM  [facility] [address] [place], [place], [address] Phone: [phone]   Fax:  [phone]  Name: Hannah Jefferson MRN: [id number] Date of Birth: 26-Dec-1955

## 2018-09-25 NOTE — Patient Instructions (Signed)
Access Code: [email or web address]  URL: [email or web address]  Date: 09/25/2018  Prepared by: Floreen Comber   Exercises  Romberg Stance with Head Rotation - 3 sets - 30 hold - 1x daily - 5x weekly  Romberg Stance with Eyes Closed - 3 sets - 30 hold - 1x daily - 5x weekly  Tandem Stance with Head Rotation - 3 sets - 30 hold - 1x daily - 5x weekly  Romberg Stance on Foam Pad with Head Rotation - 3 sets - 30 hold - 1x daily - 5x weekly  Supine ITB Stretch with Strap - 3 sets - 30 hold - 1x daily - 7x weekly  Supine Piriformis Stretch with Leg Straight - 3 sets - 30 hold - 1x daily - 7x weekly  Quadruped Alternating Leg Extensions - 10 reps - 2 sets - 1x daily - 5x weekly  Quadruped Fire Hydrant - 10 reps - 2 sets - 1x daily - 5x weekly  Single Leg Sit to Stand with Arms Crossed - 10 reps - 2 sets - 1x daily - 5x weekly

## 2018-09-27 ENCOUNTER — Encounter: Payer: Self-pay | Admitting: Occupational Therapy

## 2018-09-27 ENCOUNTER — Ambulatory Visit: Payer: 59 | Admitting: Occupational Therapy

## 2018-09-27 DIAGNOSIS — M6281 Muscle weakness (generalized): Secondary | ICD-10-CM

## 2018-09-27 DIAGNOSIS — R278 Other lack of coordination: Secondary | ICD-10-CM

## 2018-09-27 DIAGNOSIS — R2681 Unsteadiness on feet: Secondary | ICD-10-CM

## 2018-09-27 DIAGNOSIS — R208 Other disturbances of skin sensation: Secondary | ICD-10-CM

## 2018-09-27 NOTE — Therapy (Signed)
[facility] [address] [place], [place], [address] Phone: [phone]   Fax:  [phone]  Occupational Therapy Treatment  Patient Details  Name: Hannah Jefferson MRN: [id number] Date of Birth: 1956/02/07 No data recorded  Encounter Date: 09/27/2018  OT End of Session - 09/27/18 1648    Visit Number  20    Number of Visits  20    Date for OT Re-Evaluation  10/11/18    Authorization Type  U[employer] 20 visit limit OT    Authorization - Visit Number  20    Authorization - Number of Visits  20    OT Start Time  1413    OT Stop Time  1500    OT Time Calculation (min)  47 min    Activity Tolerance  Patient tolerated treatment well       Past Medical History:  Diagnosis Date  . Chronic left hip pain   . GERD (gastroesophageal reflux disease)   . Gestational diabetes   . Hearing loss of both ears   . Hypertension   . Neuropathy    numbness/tingling from waist down  . Vision abnormalities     Past Surgical History:  Procedure Laterality Date  . IR IVC FILTER PLMT / S&I /IMG GUID/MOD SED  04/05/2018  . LAPAROSCOPIC CHOLECYSTECTOMY  1992  . REFRACTIVE SURGERY Bilateral 2004    There were no vitals filed for this visit.  Subjective Assessment - 09/27/18 1645    Subjective   I am really pleased with progress I have made    Pertinent History  surgery to remove tumor on spine 03/13/18    Limitations  none    Currently in Pain?  No/denies       Patient seen for aquatic therapy today.  Treatment took place in water 2.5-4 feet deep depending upon activity.  Pt entered the pool via steps and 2 hand railings independently.  Treatment session today focused on educating pt on aquatic HEP as well as reinforcing education related to basic water principles.  Pt was given handout with HEP earlier this week to review.  HEP includes activities focused on balance, postural alignment and postural control, core strengthening, LE and UE  strengthening as well as continuing to build activity tolerance. Pt reported that today she changed beds, did laundry and cleaned the bathroom before coming to therapy.  Pt also reports she is feeling much more comfortable walking in the community.  Pt was able to return demonstrate all activities as well as verbalize understanding of water principles to allow her to increase or decrease the challenge as well as to be more specific about what she is working on with an activity.  Pt exited the pool via steps using 2 hand rails independently.                       OT Short Term Goals - 09/27/18 1646      OT SHORT TERM GOAL #1   Title  Patient will complete a home exercise program designed to improve grip strength - due 08/04/18    Status  Achieved      OT SHORT TERM GOAL #2   Title  Patient wil stand in shower from tub transfer bench in shower to wash periarea with supervision    Status  Achieved      OT [place] #3   Title  Patient will demonstrate understanding of graduated driving program, and/or  driving resources to prepare for return to driving    Status  Achieved      OT SHORT TERM GOAL #4   Title  Patient will don socks and shoes, and fasten/tie shoes with modified independence    Status  Achieved        OT Long Term Goals - 09/27/18 1646      OT LONG TERM GOAL #1   Title  Patient will demonstrate sufficient activity tolerance and functional mobility to return work 4-6 hours per day following daily self care skills. - 09/29/2018    Time  8    Period  Weeks    Status  Achieved   pt has not returned to work however is able to participate in 4-6 hours of activity following daily self care skills     OT [place] #2   Title  Patient will prepare a hot meal consisting of at least two items with no more than intermittent min assist    Time  8    Period  Weeks    Status  Achieved      OT LONG TERM GOAL #3   Title  Patient will complete simple  housekeeping and laundry from ambulatory level without physical assistance    Time  8    Period  Weeks    Status  Achieved      OT LONG TERM GOAL #4   Title  Patient will demonstrate improved dynamic stand balance to stand without UE support for greater than 15 min while completing familiar functional task.      Time  8    Period  Weeks    Status  Achieved      OT LONG TERM GOAL #5   Title  Pt will be mod I with aquatic based HEP for community wellness     Status  Achieved            Plan - 09/27/18 1646    Clinical Impression Statement  Pt has met all goals and is ready for discharge.  Pt has made excellent progress. Will place pt on hold with plan to reassess and upgrade aquatic HEP prn in a few months.     Occupational Profile and client history currently impacting functional performance  Patient is married, worked full time as Glass blower/designer, very active in her church with excellent extended family support    Occupational performance deficits (Please refer to evaluation for details):  ADL's;IADL's;Work;Leisure    Rehab Potential  Excellent    OT Frequency  1x / week    OT Duration  4 weeks    OT Treatment/Interventions  Self-care/ADL training;Therapeutic exercise;Aquatic Therapy;Balance training;Therapeutic activities;Functional Mobility Training;DME and/or AE instruction;Energy conservation;Patient/family education;Neuromuscular education;Stair Training    Plan  place on hold with plan to reassess and upgrade aquatic HEP prn in a few months.     Consulted and Agree with Plan of Care  Patient       Patient will benefit from skilled therapeutic intervention in order to improve the following deficits and impairments:  Decreased knowledge of use of DME, Pain, Decreased mobility, Impaired sensation, Improper body mechanics, Decreased activity tolerance, Decreased endurance, Decreased strength, Increased muscle spasms, Improper spinal/pelvic alignment, Decreased balance, Difficulty  walking  Visit Diagnosis: Muscle weakness (generalized)  Unsteadiness on feet  Other lack of coordination  Other disturbances of skin sensation    Problem List Patient Active Problem List   Diagnosis Date Noted  . Anxiety and depression  05/30/2018  . E. coli UTI 04/27/2018  . Acute blood loss anemia   . Acute deep vein thrombosis (DVT) of right femoral vein ([place])   . Hyponatremia   . Hypokalemia   . Hypomagnesemia   . Ileus, postoperative ([place])   . Thoracic myelopathy 03/21/2018  . Neurogenic bowel 03/21/2018  . Neurogenic bladder 03/21/2018  . Left wrist fracture, sequela 03/21/2018  . Numbness 02/15/2018  . Ataxic gait 02/15/2018  . Left leg weakness 02/15/2018  . Other fatigue 02/15/2018  . Urinary incontinence 02/15/2018  . Bowel incontinence 02/15/2018    Quay Burow, OTR/L 09/27/2018, 4:49 PM  [facility] [address] [place], [place], [address] Phone: [phone]   Fax:  [phone]  Name: Hannah Jefferson MRN: [id number] Date of Birth: [birth year]

## 2018-10-02 ENCOUNTER — Encounter: Payer: 59 | Attending: Physical Medicine & Rehabilitation | Admitting: Physical Medicine & Rehabilitation

## 2018-10-02 ENCOUNTER — Encounter: Payer: Self-pay | Admitting: Physical Medicine & Rehabilitation

## 2018-10-02 VITALS — BP 147/93 | HR 75 | Ht 64.0 in | Wt 204.0 lb

## 2018-10-02 DIAGNOSIS — G629 Polyneuropathy, unspecified: Secondary | ICD-10-CM | POA: Insufficient documentation

## 2018-10-02 DIAGNOSIS — K219 Gastro-esophageal reflux disease without esophagitis: Secondary | ICD-10-CM | POA: Diagnosis not present

## 2018-10-02 DIAGNOSIS — I82411 Acute embolism and thrombosis of right femoral vein: Secondary | ICD-10-CM

## 2018-10-02 DIAGNOSIS — K592 Neurogenic bowel, not elsewhere classified: Secondary | ICD-10-CM | POA: Insufficient documentation

## 2018-10-02 DIAGNOSIS — Z8349 Family history of other endocrine, nutritional and metabolic diseases: Secondary | ICD-10-CM | POA: Insufficient documentation

## 2018-10-02 DIAGNOSIS — G8220 Paraplegia, unspecified: Secondary | ICD-10-CM | POA: Diagnosis not present

## 2018-10-02 DIAGNOSIS — M25552 Pain in left hip: Secondary | ICD-10-CM | POA: Diagnosis not present

## 2018-10-02 DIAGNOSIS — Z833 Family history of diabetes mellitus: Secondary | ICD-10-CM | POA: Diagnosis not present

## 2018-10-02 DIAGNOSIS — N319 Neuromuscular dysfunction of bladder, unspecified: Secondary | ICD-10-CM | POA: Diagnosis not present

## 2018-10-02 DIAGNOSIS — G8929 Other chronic pain: Secondary | ICD-10-CM | POA: Diagnosis not present

## 2018-10-02 DIAGNOSIS — M4714 Other spondylosis with myelopathy, thoracic region: Secondary | ICD-10-CM | POA: Diagnosis not present

## 2018-10-02 DIAGNOSIS — Z9049 Acquired absence of other specified parts of digestive tract: Secondary | ICD-10-CM | POA: Insufficient documentation

## 2018-10-02 DIAGNOSIS — Z7901 Long term (current) use of anticoagulants: Secondary | ICD-10-CM | POA: Insufficient documentation

## 2018-10-02 DIAGNOSIS — F329 Major depressive disorder, single episode, unspecified: Secondary | ICD-10-CM | POA: Insufficient documentation

## 2018-10-02 DIAGNOSIS — Z8249 Family history of ischemic heart disease and other diseases of the circulatory system: Secondary | ICD-10-CM | POA: Diagnosis not present

## 2018-10-02 DIAGNOSIS — Z818 Family history of other mental and behavioral disorders: Secondary | ICD-10-CM | POA: Insufficient documentation

## 2018-10-02 DIAGNOSIS — F419 Anxiety disorder, unspecified: Secondary | ICD-10-CM | POA: Insufficient documentation

## 2018-10-02 DIAGNOSIS — H9193 Unspecified hearing loss, bilateral: Secondary | ICD-10-CM | POA: Insufficient documentation

## 2018-10-02 DIAGNOSIS — I82402 Acute embolism and thrombosis of unspecified deep veins of left lower extremity: Secondary | ICD-10-CM | POA: Diagnosis not present

## 2018-10-02 DIAGNOSIS — Z825 Family history of asthma and other chronic lower respiratory diseases: Secondary | ICD-10-CM | POA: Diagnosis not present

## 2018-10-02 DIAGNOSIS — I10 Essential (primary) hypertension: Secondary | ICD-10-CM | POA: Diagnosis not present

## 2018-10-02 DIAGNOSIS — H539 Unspecified visual disturbance: Secondary | ICD-10-CM | POA: Insufficient documentation

## 2018-10-02 NOTE — Patient Instructions (Signed)
PLEASE FEEL FREE TO CALL OUR OFFICE WITH ANY PROBLEMS OR QUESTIONS ([phone])      

## 2018-10-02 NOTE — Progress Notes (Signed)
Subjective:    Patient ID: Hannah Jefferson, female    DOB: Feb 18, 1956, 62 y.o.   MRN: [id number]  HPI  Mrs Dhingra is here in follow up of her thoracic myelopathy.  She is about to complete outpatient therapies.  She is joining a gym with the pool and is excited to use the water.  She likes to be in the water for ambulation and physical activity.  She has been working 4 hours per day 3 days per week.  She is usually pretty tired after working.  She has to walk short distances to and from her office as well as to the copy machine.  She is caring some light objects as well.  She develops spasms in her back and has had some pain radiating down boths legs typically worst near the end of the day.  She also has had some cramping in her legs.   She has ongoing sensory deficits in both legs but is able to distinguish hot/cold.   Bowels and bladder are working nicely. She is continent.   She remains on Pradaxa for her bilateral lower extremity clots.  She also has an IVC filter in place.  Last Doppler test in August was somewhat indeterminate.   Pain Inventory Average Pain 3 Pain Right Now 0 My pain is ?  In the last 24 hours, has pain interfered with the following? General activity 0 Relation with others 0 Enjoyment of life 0 What TIME of day is your pain at its worst? morning, evening Sleep (in general) Fair  Pain is worse with: walking and standing Pain improves with: heat/ice, therapy/exercise and medication Relief from Meds: 9  Mobility walk with assistance use a cane use a walker how many minutes can you walk? 15-20 ability to climb steps?  yes do you drive?  yes Do you have any goals in this area?  yes  Function employed # of hrs/week 24 I need assistance with the following:  shopping Do you have any goals in this area?  yes  Neuro/Psych numbness tingling trouble walking spasms  Prior Studies Any changes since last visit?  no  Physicians involved in your  care Any changes since last visit?  no   Family History  Problem Relation Age of Onset  . Dementia Mother   . COPD Mother   . Heart disease Father   . Thyroid nodules Sister   . Diabetes Mellitus II Sister   . Heart disease Brother   . Diabetes Mellitus II Brother   . Diabetes Mellitus II Brother   . Heart disease Brother    Social History   Socioeconomic History  . Marital status: Married    Spouse name: Not on file  . Number of children: 2  . Years of education: Not on file  . Highest education level: Not on file  Occupational History  . Not on file  Social Needs  . Financial resource strain: Not on file  . Food insecurity:    Worry: Not on file    Inability: Not on file  . Transportation needs:    Medical: Not on file    Non-medical: Not on file  Tobacco Use  . Smoking status: Never Smoker  . Smokeless tobacco: Never Used  Substance and Sexual Activity  . Alcohol use: Never    Frequency: Never  . Drug use: Never  . Sexual activity: Not on file  Lifestyle  . Physical activity:    Days per week: Not on  file    Minutes per session: Not on file  . Stress: Not on file  Relationships  . Social connections:    Talks on phone: Not on file    Gets together: Not on file    Attends religious service: Not on file    Active member of club or organization: Not on file    Attends meetings of clubs or organizations: Not on file    Relationship status: Not on file  Other Topics Concern  . Not on file  Social History Narrative  . Not on file   Past Surgical History:  Procedure Laterality Date  . IR IVC FILTER PLMT / S&I /IMG GUID/MOD SED  04/05/2018  . LAPAROSCOPIC CHOLECYSTECTOMY  1992  . REFRACTIVE SURGERY Bilateral 2004   Past Medical History:  Diagnosis Date  . Chronic left hip pain   . GERD (gastroesophageal reflux disease)   . Gestational diabetes   . Hearing loss of both ears   . Hypertension   . Neuropathy    numbness/tingling from waist down  .  Vision abnormalities    BP (!) 147/93   Pulse 75   Ht 5\' 4"  (1.626 m)   Wt 204 lb (92.5 kg)   SpO2 95%   BMI 35.02 kg/m   Opioid Risk Score:   Fall Risk Score:  `1  Depression screen PHQ 2/9  No flowsheet data found.  Review of Systems  Constitutional: Negative.   HENT: Negative.   Eyes: Negative.   Respiratory: Negative.   Cardiovascular: Negative.   Gastrointestinal: Negative.   Genitourinary: Negative.   Musculoskeletal: Positive for gait problem.       Spasms   Skin: Negative.   Allergic/Immunologic: Negative.   Neurological: Positive for numbness.       Tingling  Hematological: Negative.        Objective:   Physical Exam  General: No acute distress HEENT: EOMI, oral membranes moist Cards: reg rate  Chest: normal effort Abdomen: Soft, NT, ND Skin: dry, intact Extremities: no edema  Neurological: Motor B/l UE 5/5.  B/l LEnearly 5/5 HF, KE and nearly 5/5 ADF. Improved proprioception in both legs and can distinguish hot and cold.  She walked without her cane today and tends to be slightly wide-based and favors the left side a bit more than right.  When holding and utilizing the cane her pelvic balance is much improved as well as speed and efficiency of her gait.  She tends to laterally rotate the foot during swing but states that this is an old habit due to a prior injury. Psychiatric:Patient very pleasant and appropriate     Assessment & Plan:  1.Paraplegia and funtional deficitssecondary to T8 meningioma/resection with myeopathy -Continue ambulation with cane and for short distance without cane. -Progress to home exercise program.  [place] support going to the gym and utilizing a pool for aquatic activities..              -May continue to work a sedentary level, part-time            -driving without problems 2. DVT Right common femoral vein (mobile), right posterior tibial and peroneal vein thrombi: -Also  with LLE DVT -pradaxa -Last Dopplers were somewhat inconclusive.            -recommend continuing pradaxa until follow-up Dopplers which I ordered for mid to late February, 2020.  If clear, we can stop the Pradaxa            -  spoke to interventional radiology who states that her [facility] IVCF can remain in up to a year or more and still be retrieved. She can be on pradaxa as well for procedure. 3. Pain Management:prn tyelnol 4. Mood:much improved -continue lexapro -anxiety resolved5. neurogenic bowel: - back to normal 9.Neurogenic bladder:voiding  Normally 10. GERD:PPI   15 minutes of face to face patient care time were spent during this visit. All questions were encouraged and answered.Follow up with me in about  4 months. Marland Kitchen  Marland Kitchen

## 2018-10-03 ENCOUNTER — Encounter: Payer: Self-pay | Admitting: Rehabilitation

## 2018-10-03 ENCOUNTER — Ambulatory Visit: Payer: 59 | Admitting: Rehabilitation

## 2018-10-03 DIAGNOSIS — R2681 Unsteadiness on feet: Secondary | ICD-10-CM | POA: Diagnosis not present

## 2018-10-03 DIAGNOSIS — R278 Other lack of coordination: Secondary | ICD-10-CM

## 2018-10-03 DIAGNOSIS — M6281 Muscle weakness (generalized): Secondary | ICD-10-CM

## 2018-10-03 DIAGNOSIS — R2689 Other abnormalities of gait and mobility: Secondary | ICD-10-CM | POA: Diagnosis not present

## 2018-10-03 NOTE — Therapy (Signed)
[facility] [address] [place], [place], [address] Phone: [phone]   Fax:  [phone]  Physical Therapy Treatment  Patient Details  Name: Hannah Jefferson MRN: [id number] Date of Birth: 1955/12/31 Referring Provider (PT): Dr Naaman Plummer   Encounter Date: 10/03/2018  PT End of Session - 10/03/18 1200    Visit Number  20    Number of Visits  20   per updated POC   Date for PT Re-Evaluation  10/13/18   per updated POC (only need 5 weeks)   Authorization Type  [employer]: 20 visits PT and 20 visits OT    Authorization - Visit Number  20    Authorization - Number of Visits  20    PT Start Time  6962    PT Stop Time  1232    PT Time Calculation (min)  45 min    Activity Tolerance  Patient tolerated treatment well    Behavior During Therapy  [facility] for tasks assessed/performed       Past Medical History:  Diagnosis Date  . Chronic left hip pain   . GERD (gastroesophageal reflux disease)   . Gestational diabetes   . Hearing loss of both ears   . Hypertension   . Neuropathy    numbness/tingling from waist down  . Vision abnormalities     Past Surgical History:  Procedure Laterality Date  . IR IVC FILTER PLMT / S&I /IMG GUID/MOD SED  04/05/2018  . LAPAROSCOPIC CHOLECYSTECTOMY  1992  . REFRACTIVE SURGERY Bilateral 2004    There were no vitals filed for this visit.  Subjective Assessment - 10/03/18 1155    Subjective  Pt reports area on L mid back is painful and tight.  No issues with L hip today.     Patient is accompained by:  Family member    Pertinent History  B DVTs s/p 4/20019 surgery with IVC filter in place, hx of gestation DM only, HTN, Chronic L hip pain, scoliosis, hiatal hernia    Patient Stated Goals  I want to get back as much as possible.     Currently in Pain?  Yes    Pain Score  5     Pain Location  Back    Pain Orientation  Mid;Left    Pain Descriptors / Indicators  Sharp;Tightness    Pain Type   Acute pain    Pain Onset  More than a month ago    Pain Frequency  Intermittent    Aggravating Factors   after a day at work     Pain Relieving Factors  stretching,                        [facility] Adult PT Treatment/Exercise - 10/03/18 1215      Ambulation/Gait   Ambulation/Gait  Yes    Ambulation/Gait Assistance  5: Supervision;4: Min guard    Ambulation/Gait Assistance Details  Pt ambulatory without cane during session in busy gym performing head turns.  Had one single LOB needing min/guard to correct, but would continue to recommend S for busier environments and use of cane in community.  Pt verbalized understanding.      Ambulation Distance (Feet)  200 Feet    Assistive device  None    Gait Pattern  Step-through pattern;Decreased dorsiflexion - right;Decreased dorsiflexion - left;Decreased stride length;Wide base of support    Ambulation Surface  Level;Indoor    Stairs  Yes  Stairs Assistance  6: Modified independent (Device/Increase time)    Stairs Assistance Details (indicate cue type and reason)  Pt able to ascend in reciprocal pattern using single rail, however needs B rails to descend in reciprocal pattern due to decreased strength in LEs.     Stair Management Technique  One rail Right;Alternating pattern;Two rails;Forwards    Number of Stairs  8    Height of Stairs  6      Standardized Balance Assessment   Standardized Balance Assessment  Berg Balance Test;Timed Up and Go Test      Berg Balance Test   Sit to Stand  Able to stand without using hands and stabilize independently    Standing Unsupported  Able to stand safely 2 minutes    Sitting with Back Unsupported but Feet Supported on Floor or Stool  Able to sit safely and securely 2 minutes    Stand to Sit  Sits safely with minimal use of hands    Transfers  Able to transfer safely, minor use of hands    Standing Unsupported with Eyes Closed  Able to stand 10 seconds safely    Standing Ubsupported with Feet  Together  Able to place feet together independently and stand 1 minute safely    From Standing, Reach Forward with Outstretched Arm  Can reach confidently >25 cm (10")    From Standing Position, Pick up Object from Floor  Able to pick up shoe safely and easily    From Standing Position, Turn to Look Behind Over each Shoulder  Looks behind from both sides and weight shifts well    Turn 360 Degrees  Able to turn 360 degrees safely but slowly    Standing Unsupported, Alternately Place Feet on Step/Stool  Able to complete 4 steps without aid or supervision    Standing Unsupported, One Foot in [place] to place foot tandem independently and hold 30 seconds    Standing on One Leg  Able to lift leg independently and hold 5-10 seconds    Total Score  51      Therapeutic Activites    Therapeutic Activities  Other Therapeutic Activities    Other Therapeutic Activities  Pt able to perform floor recovery transfer today at mod I level.  Performed x 2 reps during session.       Exercises   Exercises  Other Exercises    Other Exercises   Continue to note tenderness and increased tightness in L mid thoracic paraspinals.  Recommended pt get lumbar support for chair at work (demonstrated how to use during session) and also provided pt with tennis ball in order to perform deep tissue pressure and educated on how to perform soft tissue mobilization at home against wall.  Pt verbalized and return demonstration.              PT Education - 10/03/18 1200    Education Details  Plan to put pt on hold and have her return end of March/beginning of April to continue to work towards independence without AD.      Person(s) Educated  Patient    Methods  Explanation    Comprehension  Verbalized understanding       PT Short Term Goals - 08/03/18 1513      PT SHORT TERM GOAL #1   Title  Pt will be IND in HEP to improve deficits listed above. TARGET DATE FOR ALL STGS: 08/02/18    Baseline  Pt reports ability to  perform HEP independently- achieved on 9/19    Status  Achieved      PT SHORT TERM GOAL #2   Title  Pt will improve TUG time with LRAD to </=13.5sec. to decr. falls risk.     Baseline  TUG score improved to 12.97 seconds using RW- Achieved on 9/19     Status  Achieved      PT SHORT TERM GOAL #3   Title  Pt will improve gait speed to >/=2.72f/sec, with LRAD to safely amb. in the community.     Baseline  Gait speed improved to 3.21 ft/sec using RW- Achieved on 9/19     Status  Achieved      PT SHORT TERM GOAL #4   Title  Pt will amb. 500' over even terrain, with LRAD at MOD I Level to improve safety during functional mobility.     Baseline  Pt demonstrates ability to ambulate over even terrain Mod I using RW for 500 ft- Achieved on 9/19    Status  Achieved      PT SHORT TERM GOAL #5   Title  Perform BERG and write STG and LTG    Baseline  30/56    Status  Achieved      PT SHORT TERM GOAL #6   Title  Pt will improve BERG balance test to 34/56 in order to indicate decreased fall risk.     Time  4    Period  Weeks    Status  Achieved   Berg Score 43/56- goal achieved on 9/19       PT Long Term Goals - 10/03/18 1200      PT LONG TERM GOAL #1   Title  Pt will amb. 1000' over even/paved terrain with LRAD at MOD I level to improve functional mobility. TARGET DATE FOR ALL LTGS: 08/30/18    Baseline  10/14: Patient ambulates 1000' over paved terrain with her cane at Mod I level     Status  Achieved      PT LONG TERM GOAL #2   Title  Trial amb. without AD when appropriate and write goal as indicated.     Status  Achieved      PT LONG TERM GOAL #3   Title  Pt will report no falls in the last 4 weeks to improve safety.     Baseline  10/14: Patient reports no falls in the last 4 weeks    Status  Achieved      PT LONG TERM GOAL #4   Title  Pt will improve BERG balance score to >/=51/56 in order to indicate decreased fall risk. (updated goal date: 10/13/18)    Baseline  47/56 on  10/14, 51/56 on 10/03/18    Time  5    Period  Weeks    Status  Achieved      PT LONG TERM GOAL #5   Title  Pt will perform floor recovery tranfser at mod I level in order to indicate safety in case of fall but also to ensure safety when playing in floor with family.     Baseline  S on 10/14, mod I on 10/03/18    Time  5    Period  Weeks    Status  Achieved      PT LONG TERM GOAL #6   Title  Pt will ambulate x 150' level indoor surfaces while negotiating turns, performing head motions and with environmental distractions at mod I level  without AD in order to indicate improved functional mobility.    S level    Time  5    Period  Weeks    Status  Partially Met      PT LONG TERM GOAL #7   Title  Pt will negotiate up/down 8 stairs with single rail in reciprocal pattern at mod I level in order to indicate improved home and community mobility as well as LE strength.    needs B rails to descend   Time  5    Period  Weeks    Status  Partially Met            Plan - 10/03/18 1439    Clinical Impression Statement  Skilled session focused on assessment of LTGs.  Pt has met 2/4 new/revised LTGs.  Note that she partially met goal for ambulation in clinic without AD.  She was S level, however due to busy environment, did have single LOB needing min/guard to recover.  She also partially met stair goal as she is able to ascend with single rail, however needs B rails to descend in reciprocal pattern.  Will plan to place pt on hold at this time and have her return in late March to continue to progress towards functional independence.     PT Frequency  1x / week    PT Duration  Other (comment)   5 weeks   PT Treatment/Interventions  ADLs/Self Care Home Management;Biofeedback;Electrical Stimulation;Therapeutic exercise;Therapeutic activities;Functional mobility training;Stair training;Orthotic Fit/Training;Gait training;Patient/family education;DME Instruction;Neuromuscular re-education;Balance  training;Vestibular;Dry needling    PT Home Exercise Plan  [place] and Agree with Plan of Care  Patient    Family Member Consulted  son       Patient will benefit from skilled therapeutic intervention in order to improve the following deficits and impairments:  Abnormal gait, Decreased endurance, Impaired sensation, Decreased knowledge of use of DME, Decreased strength, Decreased balance, Decreased mobility, Decreased range of motion, Decreased coordination  Visit Diagnosis: Muscle weakness (generalized)  Other lack of coordination     Problem List Patient Active Problem List   Diagnosis Date Noted  . Anxiety and depression 05/30/2018  . E. coli UTI 04/27/2018  . Acute blood loss anemia   . Acute deep vein thrombosis (DVT) of right femoral vein ([place])   . Hyponatremia   . Hypokalemia   . Hypomagnesemia   . Ileus, postoperative ([place])   . Thoracic myelopathy 03/21/2018  . Neurogenic bowel 03/21/2018  . Neurogenic bladder 03/21/2018  . Left wrist fracture, sequela 03/21/2018  . Numbness 02/15/2018  . Ataxic gait 02/15/2018  . Left leg weakness 02/15/2018  . Other fatigue 02/15/2018  . Urinary incontinence 02/15/2018  . Bowel incontinence 02/15/2018    Cameron Sprang, PT, MPT [facility] [address] [place], [place], [address] Phone: [phone]   Fax:  [phone] 10/03/18, 2:46 PM  Name: Hannah Jefferson MRN: [id number] Date of Birth: [birth year]

## 2018-11-01 ENCOUNTER — Other Ambulatory Visit: Payer: Self-pay | Admitting: Family Medicine

## 2018-11-01 DIAGNOSIS — Z1231 Encounter for screening mammogram for malignant neoplasm of breast: Secondary | ICD-10-CM

## 2018-12-11 ENCOUNTER — Ambulatory Visit
Admission: RE | Admit: 2018-12-11 | Discharge: 2018-12-11 | Disposition: A | Discharge status: Home / Self Care | Payer: 59 | Source: Ambulatory Visit | Attending: Family Medicine | Admitting: Family Medicine

## 2018-12-11 DIAGNOSIS — Z1231 Encounter for screening mammogram for malignant neoplasm of breast: Secondary | ICD-10-CM | POA: Diagnosis not present

## 2019-01-01 ENCOUNTER — Encounter (HOSPITAL_COMMUNITY): Payer: 59

## 2019-01-03 ENCOUNTER — Ambulatory Visit (HOSPITAL_COMMUNITY)
Admission: RE | Admit: 2019-01-03 | Discharge: 2019-01-03 | Disposition: A | Discharge status: Home / Self Care | Payer: 59 | Source: Ambulatory Visit | Attending: Physical Medicine & Rehabilitation | Admitting: Physical Medicine & Rehabilitation

## 2019-01-03 DIAGNOSIS — M4714 Other spondylosis with myelopathy, thoracic region: Secondary | ICD-10-CM | POA: Diagnosis not present

## 2019-01-03 DIAGNOSIS — I82411 Acute embolism and thrombosis of right femoral vein: Secondary | ICD-10-CM | POA: Insufficient documentation

## 2019-01-04 ENCOUNTER — Inpatient Hospital Stay: Payer: 59 | Admitting: Hematology

## 2019-01-04 ENCOUNTER — Telehealth (HOSPITAL_COMMUNITY): Payer: Self-pay | Admitting: Physical Medicine & Rehabilitation

## 2019-01-04 ENCOUNTER — Inpatient Hospital Stay: Payer: 59

## 2019-01-04 NOTE — Telephone Encounter (Signed)
Please let Hannah Jefferson know there are ongoing thrombi in both femoral veins. She will need to continue on pradaxa. We can look at removing IVCF however while on pradaxa if she's ready to do that.   thx

## 2019-01-05 NOTE — Telephone Encounter (Signed)
Zella Ball could you work on scheduling a removal with interventional radiology next week? Thanks

## 2019-01-05 NOTE — Telephone Encounter (Signed)
Pt called back and stated she is ready to remove the IVCF if you think it is safe to be removed.

## 2019-01-05 NOTE — Telephone Encounter (Signed)
Left detailed message for patient and asked to call back in response of the message.

## 2019-01-08 ENCOUNTER — Telehealth: Payer: Self-pay | Admitting: Physical Medicine & Rehabilitation

## 2019-01-08 ENCOUNTER — Other Ambulatory Visit: Payer: Self-pay | Admitting: Interventional Radiology

## 2019-01-08 ENCOUNTER — Telehealth: Payer: Self-pay | Admitting: Registered Nurse

## 2019-01-08 DIAGNOSIS — I82411 Acute embolism and thrombosis of right femoral vein: Secondary | ICD-10-CM

## 2019-01-08 DIAGNOSIS — Z95828 Presence of other vascular implants and grafts: Secondary | ICD-10-CM

## 2019-01-08 NOTE — Telephone Encounter (Signed)
Left voicemail with patient to call the scheduling department at [phone] to get IVT removed.

## 2019-01-08 NOTE — Telephone Encounter (Signed)
Order placed to IR for IVC Filter removal, per Dr. Naaman Plummer order.

## 2019-01-31 ENCOUNTER — Encounter: Payer: 59 | Attending: Physical Medicine & Rehabilitation | Admitting: Physical Medicine & Rehabilitation

## 2019-02-01 ENCOUNTER — Other Ambulatory Visit: Payer: 59

## 2019-05-02 ENCOUNTER — Ambulatory Visit
Admission: RE | Admit: 2019-05-02 | Discharge: 2019-05-02 | Disposition: A | Discharge status: Home / Self Care | Payer: 59 | Source: Ambulatory Visit | Attending: Interventional Radiology | Admitting: Interventional Radiology

## 2019-05-02 ENCOUNTER — Other Ambulatory Visit: Payer: Self-pay | Admitting: Interventional Radiology

## 2019-05-02 ENCOUNTER — Encounter: Payer: Self-pay | Admitting: *Deleted

## 2019-05-02 DIAGNOSIS — Z95828 Presence of other vascular implants and grafts: Secondary | ICD-10-CM

## 2019-05-02 HISTORY — PX: IR RADIOLOGIST EVAL & MGMT: IMG5224

## 2019-05-02 NOTE — Progress Notes (Signed)
Patient ID: Hannah Jefferson, female   DOB: December 11, 1955, 63 y.o.   MRN: [id number]       Chief Complaint: Patient was seen in consultation today for IVC filter at the request of Smith,Candace  Referring Physician(s): Smith,Candace  History of Present Illness: Hannah Jefferson is a 63 y.o. female known to our service from Previous IVC filter placement by Dr. 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  She has done well.  She has resumed anticoagulation.  Venous Doppler evaluation performed at the hospital demonstrated post thrombotic change in bilateral femoral veins.  She is not having any leg pain or swelling.  She  does use compression hose however.  Past Medical History:  Diagnosis Date   Chronic left hip pain    GERD (gastroesophageal reflux disease)    Gestational diabetes    Hearing loss of both ears    Hypertension    Neuropathy    numbness/tingling from waist down   Vision abnormalities     Past Surgical History:  Procedure Laterality Date   IR IVC FILTER PLMT / S&I /IMG GUID/MOD SED  04/05/2018   IR RADIOLOGIST EVAL & MGMT  05/02/2019   LAPAROSCOPIC CHOLECYSTECTOMY  1992   REFRACTIVE SURGERY Bilateral 2004    Allergies: Enoxaparin and Heparin  Medications: Prior to Admission medications   Medication Sig Start Date End Date Taking? Authorizing Provider  acetaminophen (TYLENOL) 500 MG tablet Take 500 mg by mouth every 6 (six) hours as needed.    [provider]  calcium carbonate (TUMS - DOSED IN MG ELEMENTAL CALCIUM) 500 MG chewable tablet Chew 1 tablet by mouth 3 (three) times daily as needed for indigestion or heartburn.    [provider]  dabigatran (PRADAXA) 150 MG CAPS capsule Take 150 mg by mouth 2 (two) times daily.    [provider]  docusate sodium (COLACE) 100 MG capsule Take 100 mg by mouth daily as needed for mild constipation.     [provider]  escitalopram (LEXAPRO) 10 MG tablet Take 10 mg by mouth daily.    [provider]  lisinopril (PRINIVIL,ZESTRIL) 10 MG tablet Take 10 mg by mouth daily.    [provider]  methocarbamol (ROBAXIN) 500 MG tablet Patient takes as needed 03/08/18   [provider]  omeprazole (PRILOSEC) 40 MG capsule omeprazole 40 mg capsule,delayed release    [provider]  polycarbophil (FIBERCON) 625 MG tablet Take 1 tablet (625 mg total) by mouth daily. 04/27/18   Love, Ivan Anchors, PA-C  Probiotic Product (ALIGN PO) Take by mouth.    [provider]  ranitidine (ZANTAC) 150 MG tablet Take 150 mg by mouth 2 (two) times daily.    [provider]     Family History  Problem Relation Age of Onset   Dementia Mother    COPD Mother    Heart disease Father    Thyroid nodules Sister    Diabetes Mellitus II Sister    Heart disease Brother    Diabetes Mellitus II Brother    Diabetes Mellitus II Brother    Heart disease Brother    Breast cancer Neg Hx     Social History   Socioeconomic History   Marital status: Married    Spouse name: Not on file   Number of children: 2   Years of education: Not on file   Highest education level: Not on file  Occupational History   Not on file  Social Needs   Financial resource strain: Not on file   Food insecurity    Worry: Not on file    Inability: Not on file   Transportation needs    Medical: Not on file    Non-medical: Not on file  Tobacco Use   Smoking status: Never Smoker   Smokeless tobacco: Never Used  Substance and Sexual Activity   Alcohol use: Never    Frequency: Never   Drug use: Never   Sexual activity: Not on file  Lifestyle   Physical activity    Days per week: Not on file    Minutes per session: Not on file   Stress: Not on file  Relationships   Social connections    Talks on phone: Not on file    Gets together: Not on file     Attends religious service: Not on file    Active member of club or organization: Not on file    Attends meetings of clubs or organizations: Not on file    Relationship status: Not on file  Other Topics Concern   Not on file  Social History Narrative   Not on file    ECOG Status: 1 - Symptomatic but completely ambulatory    Physical Exam Vital Signs: BP (!) 170/82 (BP Location: Left Arm)    Pulse (!) 138    Temp 97.8 F (36.6 C)    SpO2 (!) 83%  Constitutional: Oriented to person, place, and time. Well-developed and well-nourished. No distress.   HENT:  Head: Normocephalic and atraumatic.  Eyes: Conjunctivae and EOM are normal. Right eye exhibits no discharge. Left eye exhibits no discharge. No scleral icterus.  Neck: No JVD present.  Pulmonary/Chest: Effort normal. No stridor. No respiratory distress.  Abdomen: soft, non distended Neurological:  alert and oriented to person, place, and time.  Skin: Skin is warm and dry.  not diaphoretic.  Psychiatric:   normal mood and affect.   behavior is normal. Judgment and thought content normal.   Mallampati Score:  Review of Systems Review of Systems: A 12 point ROS discussed and pertinent positives are indicated in the HPI above.  All other systems are negative.     Imaging: US Venous Img Lower Bilateral  Result Date: 05/02/2019 CLINICAL DATA:  History of DVT.  IVC filter placement 04/05/2018. EXAM: BILATERAL LOWER EXTREMITY VENOUS DOPPLER ULTRASOUND TECHNIQUE: Gray-scale sonography with compression, as well as color and duplex ultrasound, were performed to evaluate the deep venous system from the level of the common femoral vein through the popliteal and proximal calf veins. COMPARISON:  01/03/2019 by report only FINDINGS: On the left, normal compressibility of the common femoral and popliteal veins, as well as the proximal calf veins. There is hypoechoic wall thickening through the femoral vein with scattered areas of antegrade  monophasic flow on color Doppler interrogation throughout this segment. Retrograde flow in the left great saphenous vein at the saphenofemoral junction is noted. Visualized segments of the saphenous venous system normal in caliber and compressibility. Normal flow signal in the profunda femoris vein. On the right, normal compressibility of common femoral and popliteal veins as well as proximal calf veins. Antegrade flow signal with normal respiratory variation. There is hypoechoic wall thickening throughout the femoral vein which is incompletely compressible. Scattered areas of continued antegrade monophasic flow noted on color Doppler interrogation. Great saphenous vein unremarkable. IMPRESSION: 1. Chronic post thrombotic changes in bilateral femoral veins, stable compared to  report from prior examination. 2. No evidence of superimposed or acute DVT. Electronically Signed   By: Lucrezia Europe M.D.   On: 05/02/2019 14:45   Ir Radiologist Eval & Mgmt  Result Date: 05/02/2019 Please refer to notes tab for details about interventional procedure. (Op Note)   Labs:  CBC: No results for input(s): WBC, HGB, HCT, PLT in the last 8760 hours.  COAGS: No results for input(s): INR, APTT in the last 8760 hours.  BMP: No results for input(s): NA, K, CL, CO2, GLUCOSE, BUN, CALCIUM, CREATININE, GFRNONAA, GFRAA in the last 8760 hours.  Invalid input(s): CMP  LIVER FUNCTION TESTS: No results for input(s): BILITOT, AST, ALT, ALKPHOS, PROT, ALBUMIN in the last 8760 hours.  TUMOR MARKERS: No results for input(s): AFPTM, CEA, CA199, CHROMGRNA in the last 8760 hours.  Assessment and Plan: Impression is that the patient is doing well after IVC filter placement.  Post thrombotic changes in the bilateral femoral veins, stable to those described on prior study of 01/03/2019.  No suggestion of progressive or acute DVT. We discussed the pathophysiology of venous thromboembolism.  We discussed the protective role of an IVC  filter in the setting of contraindication to anticoagulation.We discussed the potential long-term complications of IVC filtration including caval wall perforation, damage to adjacent organs, elevated risk of caval thrombosis, filter fragmentation embolization.] We discussed the technique of IVC filter retrieval via IJ approach, anticipated benefits, possible side effects and complications, and minimal time course to recovery.  This will be performed under only moderate sedation with local anesthetic.  She will continue on her anticoagulation through the procedure and beyond as tolerated. She seemed to understand and did ask appropriate questions.  She was motivated to proceed.  Accordingly, we can set her up for IVC filter retrieval under moderate sedation at the hospital as an outpatient, at her convenience.   Thank you for this interesting consult.  I greatly enjoyed meeting Hannah Jefferson and look forward to participating in their care.  A copy of this report was sent to the requesting provider on this date.  Electronically Signed: Rickard Rhymes 05/02/2019, 3:09 PM   I spent a total of    25 Minutes in face to face in clinical consultation, greater than 50% of which was counseling/coordinating care for IVC filter removal.

## 2019-05-09 ENCOUNTER — Other Ambulatory Visit: Payer: Self-pay | Admitting: Student

## 2019-05-09 ENCOUNTER — Other Ambulatory Visit: Payer: Self-pay | Admitting: Radiology

## 2019-05-10 ENCOUNTER — Ambulatory Visit (HOSPITAL_COMMUNITY)
Admission: RE | Admit: 2019-05-10 | Discharge: 2019-05-10 | Disposition: A | Discharge status: Home / Self Care | Payer: 59 | Source: Ambulatory Visit | Attending: Interventional Radiology | Admitting: Interventional Radiology

## 2019-05-10 ENCOUNTER — Other Ambulatory Visit: Payer: Self-pay

## 2019-05-10 ENCOUNTER — Encounter (HOSPITAL_COMMUNITY): Payer: Self-pay

## 2019-05-10 DIAGNOSIS — Z79899 Other long term (current) drug therapy: Secondary | ICD-10-CM | POA: Diagnosis not present

## 2019-05-10 DIAGNOSIS — R200 Anesthesia of skin: Secondary | ICD-10-CM | POA: Diagnosis not present

## 2019-05-10 DIAGNOSIS — K219 Gastro-esophageal reflux disease without esophagitis: Secondary | ICD-10-CM | POA: Insufficient documentation

## 2019-05-10 DIAGNOSIS — I10 Essential (primary) hypertension: Secondary | ICD-10-CM | POA: Diagnosis not present

## 2019-05-10 DIAGNOSIS — H9193 Unspecified hearing loss, bilateral: Secondary | ICD-10-CM | POA: Diagnosis not present

## 2019-05-10 DIAGNOSIS — G629 Polyneuropathy, unspecified: Secondary | ICD-10-CM | POA: Diagnosis not present

## 2019-05-10 DIAGNOSIS — Z8249 Family history of ischemic heart disease and other diseases of the circulatory system: Secondary | ICD-10-CM | POA: Diagnosis not present

## 2019-05-10 DIAGNOSIS — Z452 Encounter for adjustment and management of vascular access device: Secondary | ICD-10-CM | POA: Diagnosis not present

## 2019-05-10 DIAGNOSIS — Z86718 Personal history of other venous thrombosis and embolism: Secondary | ICD-10-CM | POA: Diagnosis not present

## 2019-05-10 DIAGNOSIS — Z95828 Presence of other vascular implants and grafts: Secondary | ICD-10-CM

## 2019-05-10 HISTORY — PX: IR IVC FILTER RETRIEVAL / S&I /IMG GUID/MOD SED: IMG5308

## 2019-05-10 LAB — PROTIME-INR
INR: 0.9 (ref 0.8–1.2)
Prothrombin Time: 12.4 seconds (ref 11.4–15.2)

## 2019-05-10 LAB — CBC
HCT: 42.3 % (ref 36.0–46.0)
Hemoglobin: 13.7 g/dL (ref 12.0–15.0)
MCH: 27.3 pg (ref 26.0–34.0)
MCHC: 32.4 g/dL (ref 30.0–36.0)
MCV: 84.3 fL (ref 80.0–100.0)
Platelets: 241 10*3/uL (ref 150–400)
RBC: 5.02 MIL/uL (ref 3.87–5.11)
RDW: 14 % (ref 11.5–15.5)
WBC: 5.6 10*3/uL (ref 4.0–10.5)
nRBC: 0 % (ref 0.0–0.2)

## 2019-05-10 LAB — BASIC METABOLIC PANEL
Anion gap: 6 (ref 5–15)
BUN: 13 mg/dL (ref 6–20)
CO2: 28 mmol/L (ref 22–32)
Calcium: 9.2 mg/dL (ref 8.9–10.3)
Chloride: 106 mmol/L (ref 98–111)
Creatinine, Ser: 0.65 mg/dL (ref 0.44–1.00)
GFR calc Af Amer: >60 mL/min (ref >60)
GFR calc non Af Amer: >60 mL/min (ref >60)
Glucose, Bld: 95 mg/dL (ref 70–99)
Potassium: 3.6 mmol/L (ref 3.5–5.1)
Sodium: 140 mmol/L (ref 135–145)

## 2019-05-10 MED ORDER — FENTANYL CITRATE (PF) 100 MCG/2ML IJ SOLN
INTRAMUSCULAR | Status: AC
  Filled 2019-05-10: qty 2

## 2019-05-10 MED ORDER — LIDOCAINE HCL 1 % IJ SOLN
INTRAMUSCULAR | Status: AC
  Filled 2019-05-10: qty 20

## 2019-05-10 MED ORDER — MIDAZOLAM HCL 2 MG/2ML IJ SOLN
INTRAMUSCULAR | Status: AC | PRN
  Administered 2019-05-10 (×2): 1 mg via INTRAVENOUS

## 2019-05-10 MED ORDER — IOHEXOL 300 MG/ML  SOLN
100.0000 mL | Freq: Once | INTRAMUSCULAR | Status: AC | PRN
  Administered 2019-05-10: 20 mL via INTRAVENOUS

## 2019-05-10 MED ORDER — SODIUM CHLORIDE 0.9 % IV SOLN
INTRAVENOUS | Status: DC
  Administered 2019-05-10: 07:00:00 via INTRAVENOUS

## 2019-05-10 MED ORDER — HYDROCODONE-ACETAMINOPHEN 5-325 MG PO TABS: 1.0000 | ORAL_TABLET | ORAL | Status: DC | PRN

## 2019-05-10 MED ORDER — MIDAZOLAM HCL 2 MG/2ML IJ SOLN
INTRAMUSCULAR | Status: AC
  Filled 2019-05-10: qty 2

## 2019-05-10 MED ORDER — FENTANYL CITRATE (PF) 100 MCG/2ML IJ SOLN
INTRAMUSCULAR | Status: AC | PRN
  Administered 2019-05-10: 50 ug via INTRAVENOUS

## 2019-05-10 MED ORDER — LIDOCAINE HCL (PF) 1 % IJ SOLN
INTRAMUSCULAR | Status: AC | PRN
  Administered 2019-05-10: 5 mL

## 2019-05-10 NOTE — Discharge Instructions (Addendum)
Inferior Vena Cava Filter Removal, Care After This sheet gives you information about how to care for yourself after your procedure. Your health care provider may also give you more specific instructions. If you have problems or questions, contact your health care provider. What can I expect after the procedure? After the procedure, it is common to have:  Mild pain and bruising around your incision in your neck or groin.  Fatigue. Follow these instructions at home: Incision care  Follow instructions from your health care provider about how to take care of your incision. Make sure you: ? Wash your hands with soap and water before you change your bandage (dressing). If soap and water are not available, use hand sanitizer. ? Change your dressing as told by your health care provider. May remove dressing and shower or bathe in 24 to 48 hours.  Keep site clean and dry.  Check your incision area every day for signs of infection. Check for: ? Redness, swelling, or more pain. ? Fluid or blood. ? Warmth. ? Pus or a bad smell. General instructions  Take over-the-counter and prescription medicines only as told by your health care provider.  Do not take baths, swim, or use a hot tub until your health care provider approves. Ask your health care provider if you may take showers. You may only be allowed to take sponge baths.  Do not drive for 24 hours if you were given a medicine to help you relax (sedative) during your procedure.  Return to your normal activities as told by your health care provider. Ask your health care provider what activities are safe for you.  Keep all follow-up visits as told by your health care provider. This is important. Contact a health care provider if:  You have chills or a fever.  You have redness, swelling, or more pain around your incision.  Your incision feels warm to the touch.  You have pus or a bad smell coming from your incision. Get help right away  if:  You have blood coming from your incision (active bleeding). ? If you have bleeding from the incision site, lie down, apply pressure to the area with a clean cloth or gauze, and get help right away.  You have chest pain.  You have difficulty breathing. Summary  Follow instructions from your health care provider about how to take care of your incision.  Return to your normal activities as told by your health care provider.  Check your incision area every day for signs of infection.  Get help right away if you have active bleeding, chest pain, or trouble breathing. This information is not intended to replace advice given to you by your health care provider. Make sure you discuss any questions you have with your health care provider. Document Released: 05/12/2017 Document Revised: 05/12/2017 Document Reviewed: 05/12/2017 Elsevier Interactive Patient Education  2019 [place]. Moderate Conscious Sedation, Adult, Care After These instructions provide you with information about caring for yourself after your procedure. Your health care provider may also give you more specific instructions. Your treatment has been planned according to current medical practices, but problems sometimes occur. Call your health care provider if you have any problems or questions after your procedure. What can I expect after the procedure? After your procedure, it is common:  To feel sleepy for several hours.  To feel clumsy and have poor balance for several hours.  To have poor judgment for several hours.  To vomit if you eat too soon.  Follow these instructions at home: For at least 24 hours after the procedure:   Do not: ? Participate in activities where you could fall or become injured. ? Drive. ? Use heavy machinery. ? Drink alcohol. ? Take sleeping pills or medicines that cause drowsiness. ? Make important decisions or sign legal documents. ? Take care of children on your  own.  Rest. Eating and drinking  Follow the diet recommended by your health care provider.  If you vomit: ? Drink water, juice, or soup when you can drink without vomiting. ? Make sure you have little or no nausea before eating solid foods. General instructions  Have a responsible adult stay with you until you are awake and alert.  Take over-the-counter and prescription medicines only as told by your health care provider.  If you smoke, do not smoke without supervision.  Keep all follow-up visits as told by your health care provider. This is important. Contact a health care provider if:  You keep feeling nauseous or you keep vomiting.  You feel light-headed.  You develop a rash.  You have a fever. Get help right away if:  You have trouble breathing. This information is not intended to replace advice given to you by your health care provider. Make sure you discuss any questions you have with your health care provider. Document Released: 08/22/2013 Document Revised: 04/05/2016 Document Reviewed: 02/21/2016 Elsevier Interactive Patient Education  2019 [employer].

## 2019-05-10 NOTE — Progress Notes (Signed)
Discharged home with husband at 70, clarified with Dr Vernard Gambles ok to continue taking blood thinner today. Pt verbalizes understanding.

## 2019-05-10 NOTE — Procedures (Signed)
  Procedure: IVCgram and filter retrieval   EBL:   minimal Complications:  none immediate  See full dictation in [facility].  Dillard Cannon MD Main # [phone] Pager  [phone]

## 2019-05-10 NOTE — H&P (Signed)
Chief Complaint: Patient was seen in consultation today for IVC filter  Referring Physician(s): Dr. Carol Ada  Supervising Physician: Arne Cleveland  Patient Status: [facility] - Out-pt  History of Present Illness: Hannah Jefferson is a 63 y.o. female with history of LE DVT after spinal surgery in 2019 requiring IVC filter placement 04/05/2018 with Dr. Barbie Banner. She has recovered well from surgery and has resumed anticogaulation. Recent dopplers showed stable clot. The patient met with Dr. Vernard Gambles in consultation 05/02/19 to discuss IVC filter removal at the request of Dr. Carol Ada.  She presents to [facility] Radiology today for removal.  She denies fever, chills, nausea, vomiting, abdominal pain, cough, shortness of breath, dysuria, leg swelling, claudication.  She has been NPO.  She does not take blood thinners.   Past Medical History:  Diagnosis Date  . Chronic left hip pain   . GERD (gastroesophageal reflux disease)   . Hearing loss of both ears   . Hypertension   . Neuropathy    numbness/tingling from waist down  . Vision abnormalities     Past Surgical History:  Procedure Laterality Date  . IR IVC FILTER PLMT / S&I /IMG GUID/MOD SED  04/05/2018  . IR RADIOLOGIST EVAL & MGMT  05/02/2019  . LAPAROSCOPIC CHOLECYSTECTOMY  1992  . REFRACTIVE SURGERY Bilateral 2004    Allergies: Enoxaparin and Heparin  Medications: Prior to Admission medications   Medication Sig Start Date End Date Taking? Authorizing Provider  calcium carbonate (TUMS - DOSED IN MG ELEMENTAL CALCIUM) 500 MG chewable tablet Chew 1 tablet by mouth 3 (three) times daily as needed for indigestion or heartburn.   Yes [provider]  polycarbophil (FIBERCON) 625 MG tablet Take 1 tablet (625 mg total) by mouth daily. 04/27/18  Yes Love, Ivan Anchors, PA-C  Probiotic Product (ALIGN PO) Take by mouth.   Yes [provider]  acetaminophen (TYLENOL) 500 MG tablet Take 500 mg by mouth every 6 (six) hours as  needed.    [provider]  dabigatran (PRADAXA) 150 MG CAPS capsule Take 150 mg by mouth 2 (two) times daily.    [provider]  docusate sodium (COLACE) 100 MG capsule Take 100 mg by mouth daily as needed for mild constipation.    [provider]  escitalopram (LEXAPRO) 10 MG tablet Take 10 mg by mouth daily.    [provider]  lisinopril (PRINIVIL,ZESTRIL) 10 MG tablet Take 10 mg by mouth daily.    [provider]  methocarbamol (ROBAXIN) 500 MG tablet Patient takes as needed 03/08/18   [provider]  omeprazole (PRILOSEC) 40 MG capsule omeprazole 40 mg capsule,delayed release    [provider]  ranitidine (ZANTAC) 150 MG tablet Take 150 mg by mouth 2 (two) times daily.    [provider]     Family History  Problem Relation Age of Onset  . Dementia Mother   . COPD Mother   . Heart disease Father   . Thyroid nodules Sister   . Diabetes Mellitus II Sister   . Heart disease Brother   . Diabetes Mellitus II Brother   . Diabetes Mellitus II Brother   . Heart disease Brother   . Breast cancer Neg Hx     Social History   Socioeconomic History  . Marital status: Married    Spouse name: Not on file  . Number of children: 2  . Years of education: Not on file  . Highest education level: Not on file  Occupational History  . Not on file  Social Needs  . Financial resource strain: Not on file  . Food insecurity    Worry: Not on file    Inability: Not on file  . Transportation needs    Medical: Not on file    Non-medical: Not on file  Tobacco Use  . Smoking status: Never Smoker  . Smokeless tobacco: Never Used  Substance and Sexual Activity  . Alcohol use: Never    Frequency: Never  . Drug use: Never  . Sexual activity: Not on file  Lifestyle  . Physical activity    Days per week: Not on file    Minutes per session: Not on file  . Stress: Not on file  Relationships  . Social Herbalist  on phone: Not on file    Gets together: Not on file    Attends religious service: Not on file    Active member of club or organization: Not on file    Attends meetings of clubs or organizations: Not on file    Relationship status: Not on file  Other Topics Concern  . Not on file  Social History Narrative  . Not on file     Review of Systems: A 12 point ROS discussed and pertinent positives are indicated in the HPI above.  All other systems are negative.  Review of Systems  Constitutional: Negative for fatigue and fever.  Respiratory: Negative for cough and shortness of breath.   Gastrointestinal: Negative for abdominal pain.  Genitourinary: Negative for dysuria.  Musculoskeletal: Negative for back pain.  Psychiatric/Behavioral: Negative for behavioral problems and confusion.    Vital Signs: There were no vitals taken for this visit.  Physical Exam Vitals signs and nursing note reviewed.  HENT:     Mouth/Throat:     Mouth: Mucous membranes are moist.     Pharynx: Oropharynx is clear.  Neck:     Musculoskeletal: Normal range of motion and neck supple.  Cardiovascular:     Rate and Rhythm: Normal rate and regular rhythm.  Pulmonary:     Effort: Pulmonary effort is normal.     Breath sounds: Normal breath sounds.  Abdominal:     General: Abdomen is flat.     Palpations: Abdomen is soft.  Neurological:     General: No focal deficit present.     Mental Status: She is alert and oriented to person, place, and time. Mental status is at baseline.  Psychiatric:        Mood and Affect: Mood normal.        Behavior: Behavior normal.        Thought Content: Thought content normal.        Judgment: Judgment normal.      MD Evaluation Airway: WNL Heart: WNL Abdomen: WNL Chest/ Lungs: WNL ASA  Classification: 3 Mallampati/Airway Score: One   Imaging: US Venous Img Lower Bilateral  Result Date: 05/02/2019 CLINICAL DATA:  History of DVT.  IVC filter placement  04/05/2018. EXAM: BILATERAL LOWER EXTREMITY VENOUS DOPPLER ULTRASOUND TECHNIQUE: Gray-scale sonography with compression, as well as color and duplex ultrasound, were performed to evaluate the deep venous system from the level of the common femoral vein through the popliteal and proximal calf veins. COMPARISON:  01/03/2019 by report only FINDINGS: On the left, normal compressibility of the common femoral and popliteal veins, as well as the proximal calf veins. There is hypoechoic wall thickening through the femoral vein with scattered areas of  antegrade monophasic flow on color Doppler interrogation throughout this segment. Retrograde flow in the left great saphenous vein at the saphenofemoral junction is noted. Visualized segments of the saphenous venous system normal in caliber and compressibility. Normal flow signal in the profunda femoris vein. On the right, normal compressibility of common femoral and popliteal veins as well as proximal calf veins. Antegrade flow signal with normal respiratory variation. There is hypoechoic wall thickening throughout the femoral vein which is incompletely compressible. Scattered areas of continued antegrade monophasic flow noted on color Doppler interrogation. Great saphenous vein unremarkable. IMPRESSION: 1. 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 2. No evidence of superimposed or acute DVT. Electronically Signed   By: Lucrezia Europe M.D.   On: 05/02/2019 14:45   Ir Radiologist Eval & Mgmt  Result Date: 05/02/2019 Please refer to notes tab for details about interventional procedure. (Op Note)   Labs:  CBC: Recent Labs    05/10/19 0720  WBC 5.6  HGB 13.7  HCT 42.3  PLT 241    COAGS: Recent Labs    05/10/19 0720  INR 0.9    BMP: Recent Labs    05/10/19 0720  NA 140  K 3.6  CL 106  CO2 28  GLUCOSE 95  BUN 13  CALCIUM 9.2  CREATININE 0.65  GFRNONAA >60  GFRAA >60    LIVER  FUNCTION TESTS: No results for input(s): BILITOT, AST, ALT, ALKPHOS, PROT, ALBUMIN in the last 8760 hours.  TUMOR MARKERS: No results for input(s): AFPTM, CEA, CA199, CHROMGRNA in the last 8760 hours.  Assessment and Plan: Patient with past medical history of DVT s/p IVC filter placement 04/05/18 presents for removal.   Case reviewed by Dr. Vernard Gambles who has met with patient in consultation and approves patient for procedure.  Patient presents today in their usual state of health.  She has been NPO.  She is currently on Pradaxa.  Risks and benefits discussed with the patient including, but not limited to bleeding, infection, contrast induced renal failure, filter migration which can lead to emergency surgery or even death, strut penetration with damage or irritation to adjacent structures and caval thrombosis.  All of the patient's questions were answered, patient is agreeable to proceed. Consent signed and in chart.   Thank you for this interesting consult.  I greatly enjoyed meeting MARIO VOONG and look forward to participating in their care.  A copy of this report was sent to the requesting provider on this date.  Electronically Signed: Docia Barrier, PA 05/10/2019, 8:25 AM   I spent a total of  15 Minutes  in face to face in clinical consultation, greater than 50% of which was counseling/coordinating care for DVT, IVC filter.

## 2019-06-17 IMAGING — CT CT ABD-PELV W/O CM
2 of 4 series · 16 of 46 positions shown, 18 images · non-contrast
Comparison: Plain film of the abdomen from earlier same day.

CLINICAL DATA: Chronic ileus postop back surgery. Abdominal
discomfort and thoracic spasms. Moderate abdominal distension

EXAM:
CT ABDOMEN AND PELVIS WITHOUT CONTRAST
TECHNIQUE: Multidetector CT imaging of the abdomen and pelvis was performed
following the standard protocol without IV contrast.

[Series 3: a/p w/o 5mm · axial · non-contrast · 0.88mm/px · z∈[+853,+1303]mm · 13 of 98 slices shown, 15 images]
[im 4/98  soft-tissue]
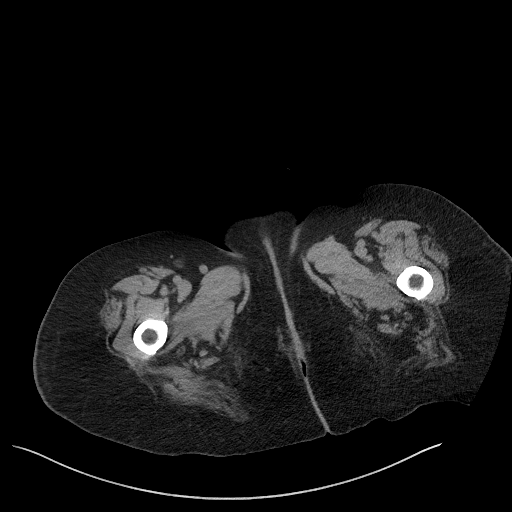
[im 4/98  bone]
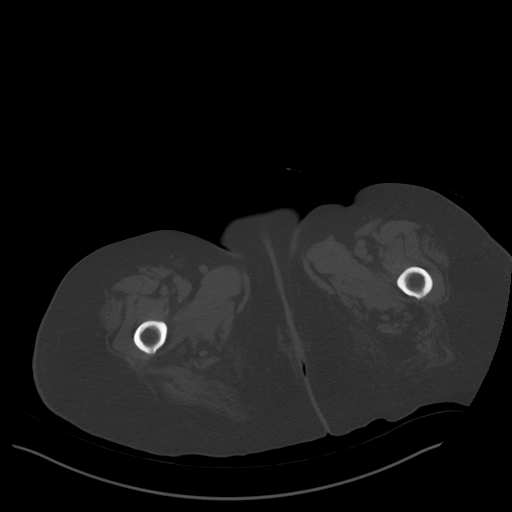
[im 12/98  soft-tissue]
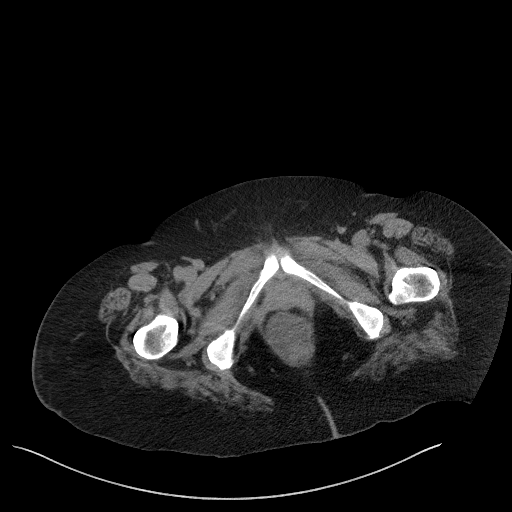
[im 20/98  soft-tissue]
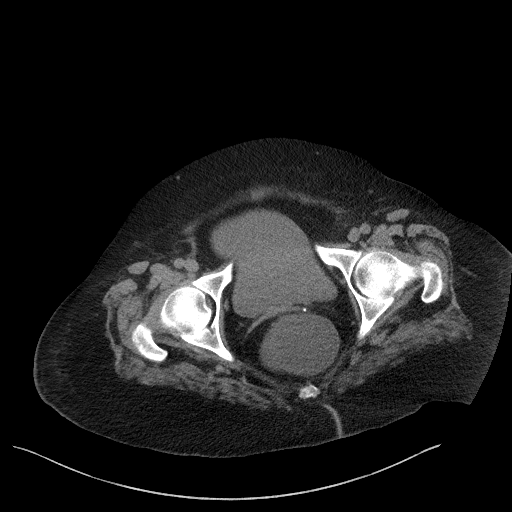
[im 28/98  soft-tissue]
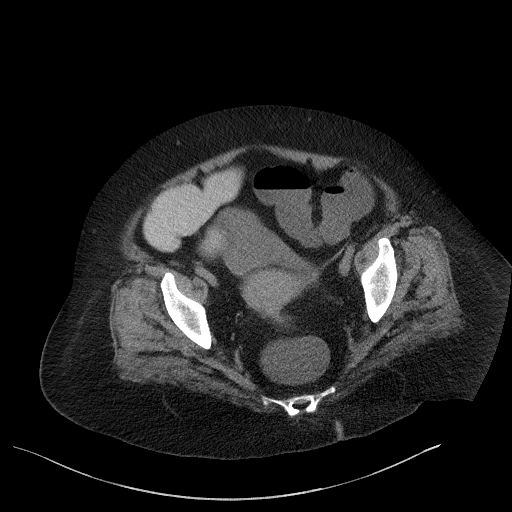
[im 35/98  soft-tissue]
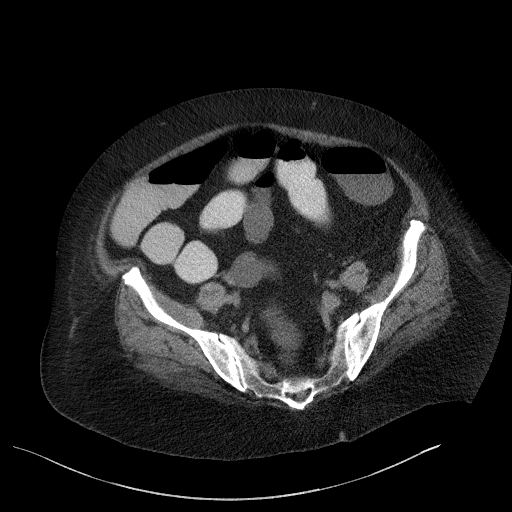
[im 43/98  soft-tissue]
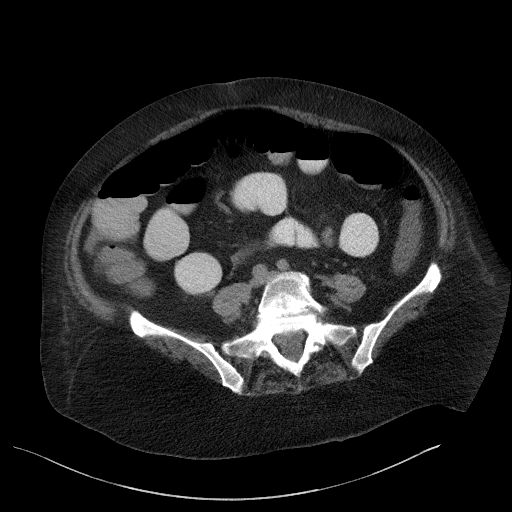
[im 51/98  soft-tissue]
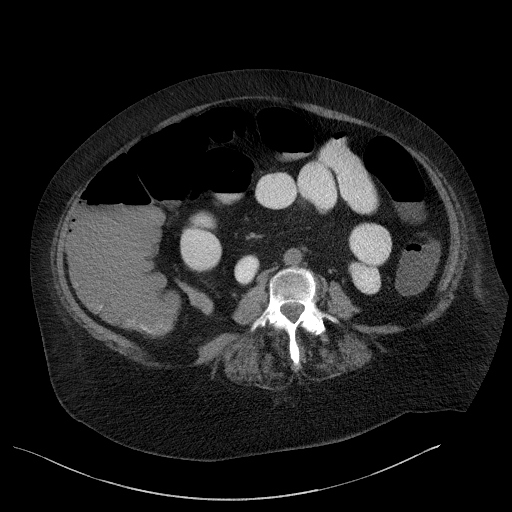
[im 55/98  soft-tissue]
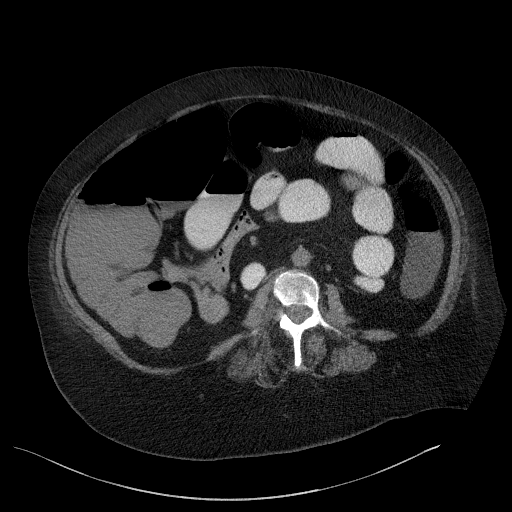
[im 63/98  soft-tissue]
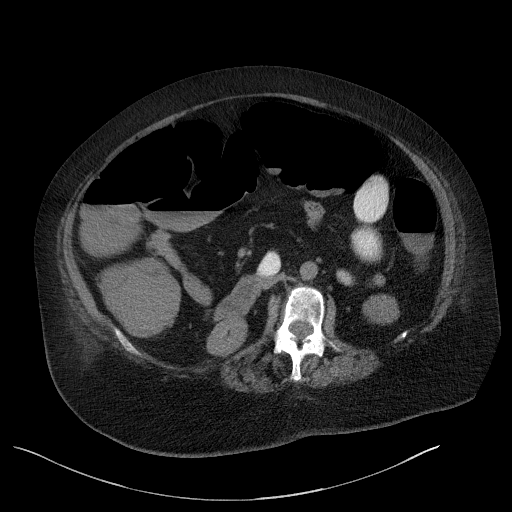
[im 63/98  bone]
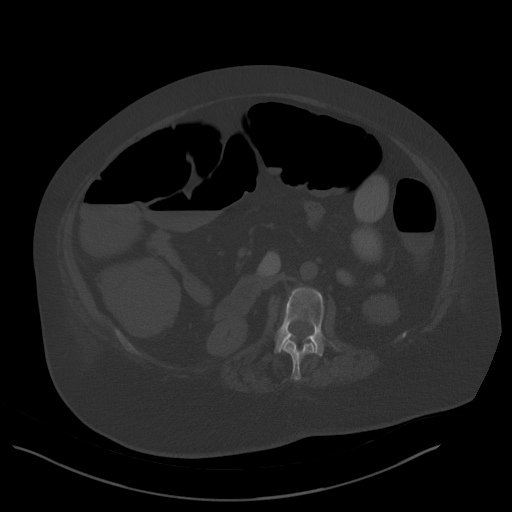
[im 70/98  soft-tissue]
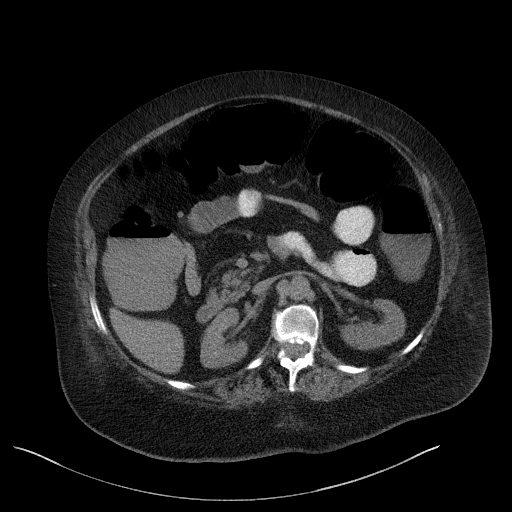
[im 78/98  soft-tissue]
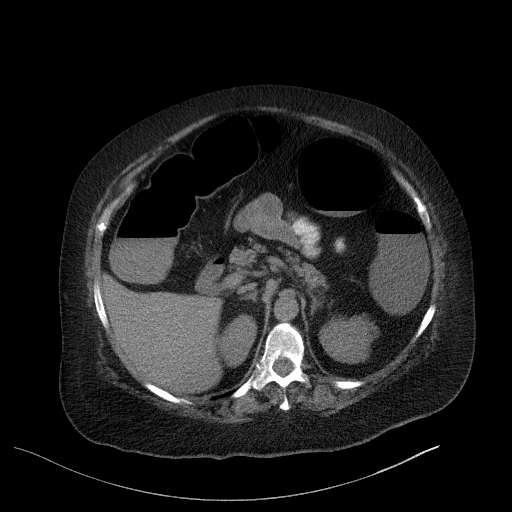
[im 86/98  soft-tissue]
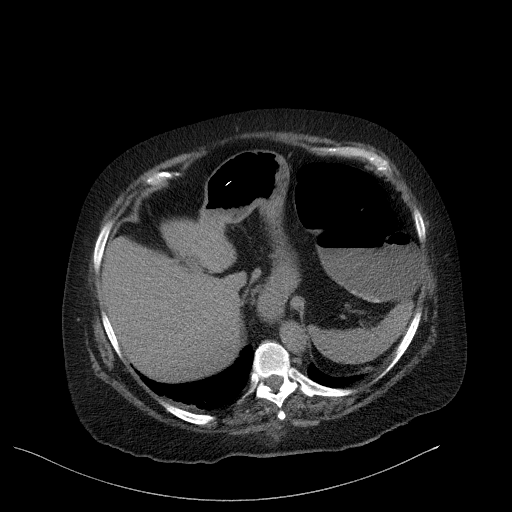
[im 94/98  soft-tissue]
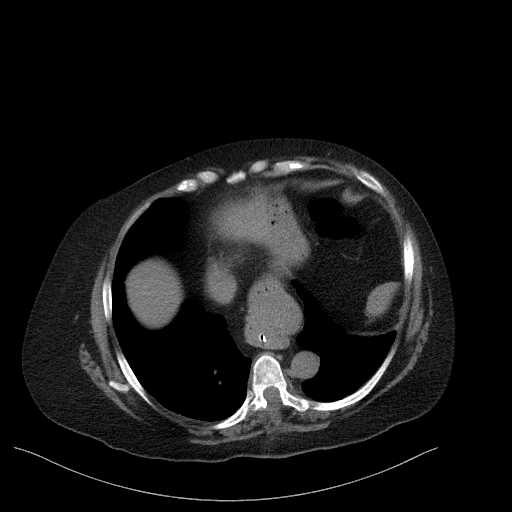

[Series 6: a/p w/o cor · coronal · non-contrast · 0.96mm/px · 3 of 184 slices shown]
[im 62/184  soft-tissue]
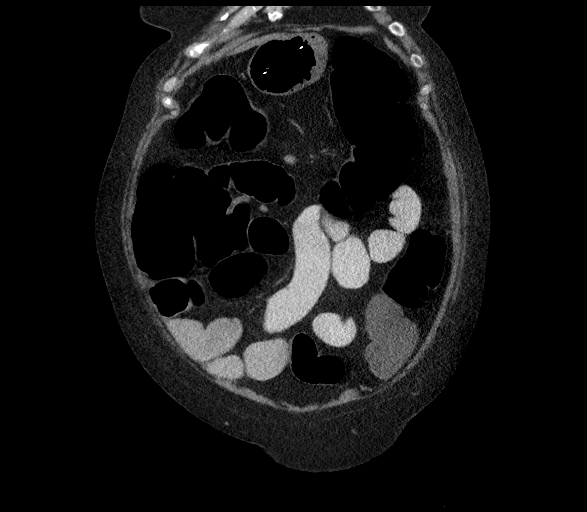
[im 82/184  soft-tissue]
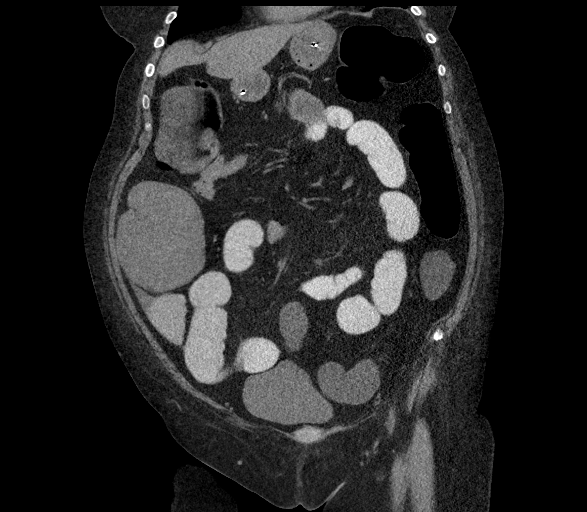
[im 102/184  soft-tissue]
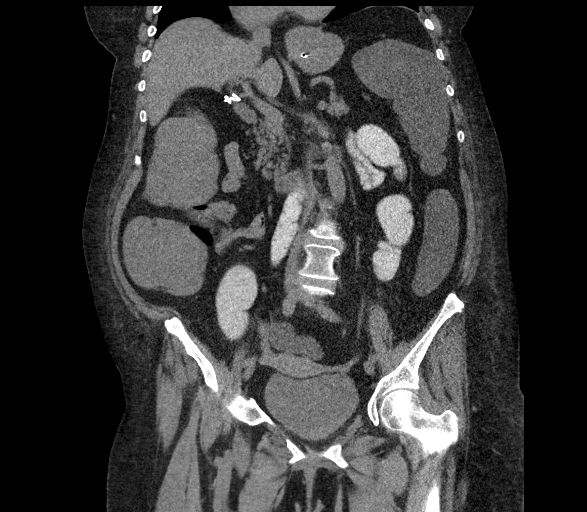

[16 of 46 positions shown; findings below may reference images not displayed]

FINDINGS: Lower chest: No acute abnormality.  Small hiatal hernia.

Hepatobiliary: No focal liver abnormality is seen. Status post
cholecystectomy. No biliary dilatation.

Pancreas: Unremarkable. No pancreatic ductal dilatation or
surrounding inflammatory changes.

Spleen: Normal in size without focal abnormality.

Adrenals/Urinary Tract: Adrenal glands appear normal. Kidneys are
unremarkable without mass, stone or hydronephrosis. No ureteral or
bladder calculi identified. Bladder is unremarkable. Small amount of
air within the bladder is presumably related to recent
catheterization.

Stomach/Bowel: Fluid and gas throughout the colon, mildly to
moderately distended throughout. Distal small bowel is upper normal
in caliber, oral contrast present to the level of the ileum. Perhaps
a small amount of oral contrast layering within the cecum. Enteric
tube in place with tip adequately positioned at the level of the
proximal duodenum.

Vascular/Lymphatic: No significant vascular findings are present. No
enlarged abdominal or pelvic lymph nodes.

Reproductive: Uterus and bilateral adnexa are unremarkable.

Other: No free fluid or abscess collection. No free intraperitoneal
air.

Musculoskeletal: No acute appearing osseous abnormality. Moderate
scoliosis of the thoracolumbar spine. Chronic bilateral pars
interarticularis defects at L5-S1 with grade [DATE] spondylolisthesis
of L5.
IMPRESSION: 1. Findings are most compatible with postoperative ileus. No
evidence of a mechanical bowel obstruction identified. Recommend
follow-up plain film examinations to ensure resolution, and to
visualize the eventual passage of the oral contrast to the large
bowel (there is likely a small amount of layering oral contrast
within the cecum). Enteric tube adequately positioned with tip at
the level of the duodenal bulb.
2. Small hiatal hernia.

## 2019-06-17 IMAGING — DX DG ABDOMEN 1V
2 series · 2 of 2 positions shown · non-contrast
Comparison: 03/26/2018

CLINICAL DATA: Ileus

EXAM:
ABDOMEN - 1 VIEW

[abdomen kub (1 of 2)]
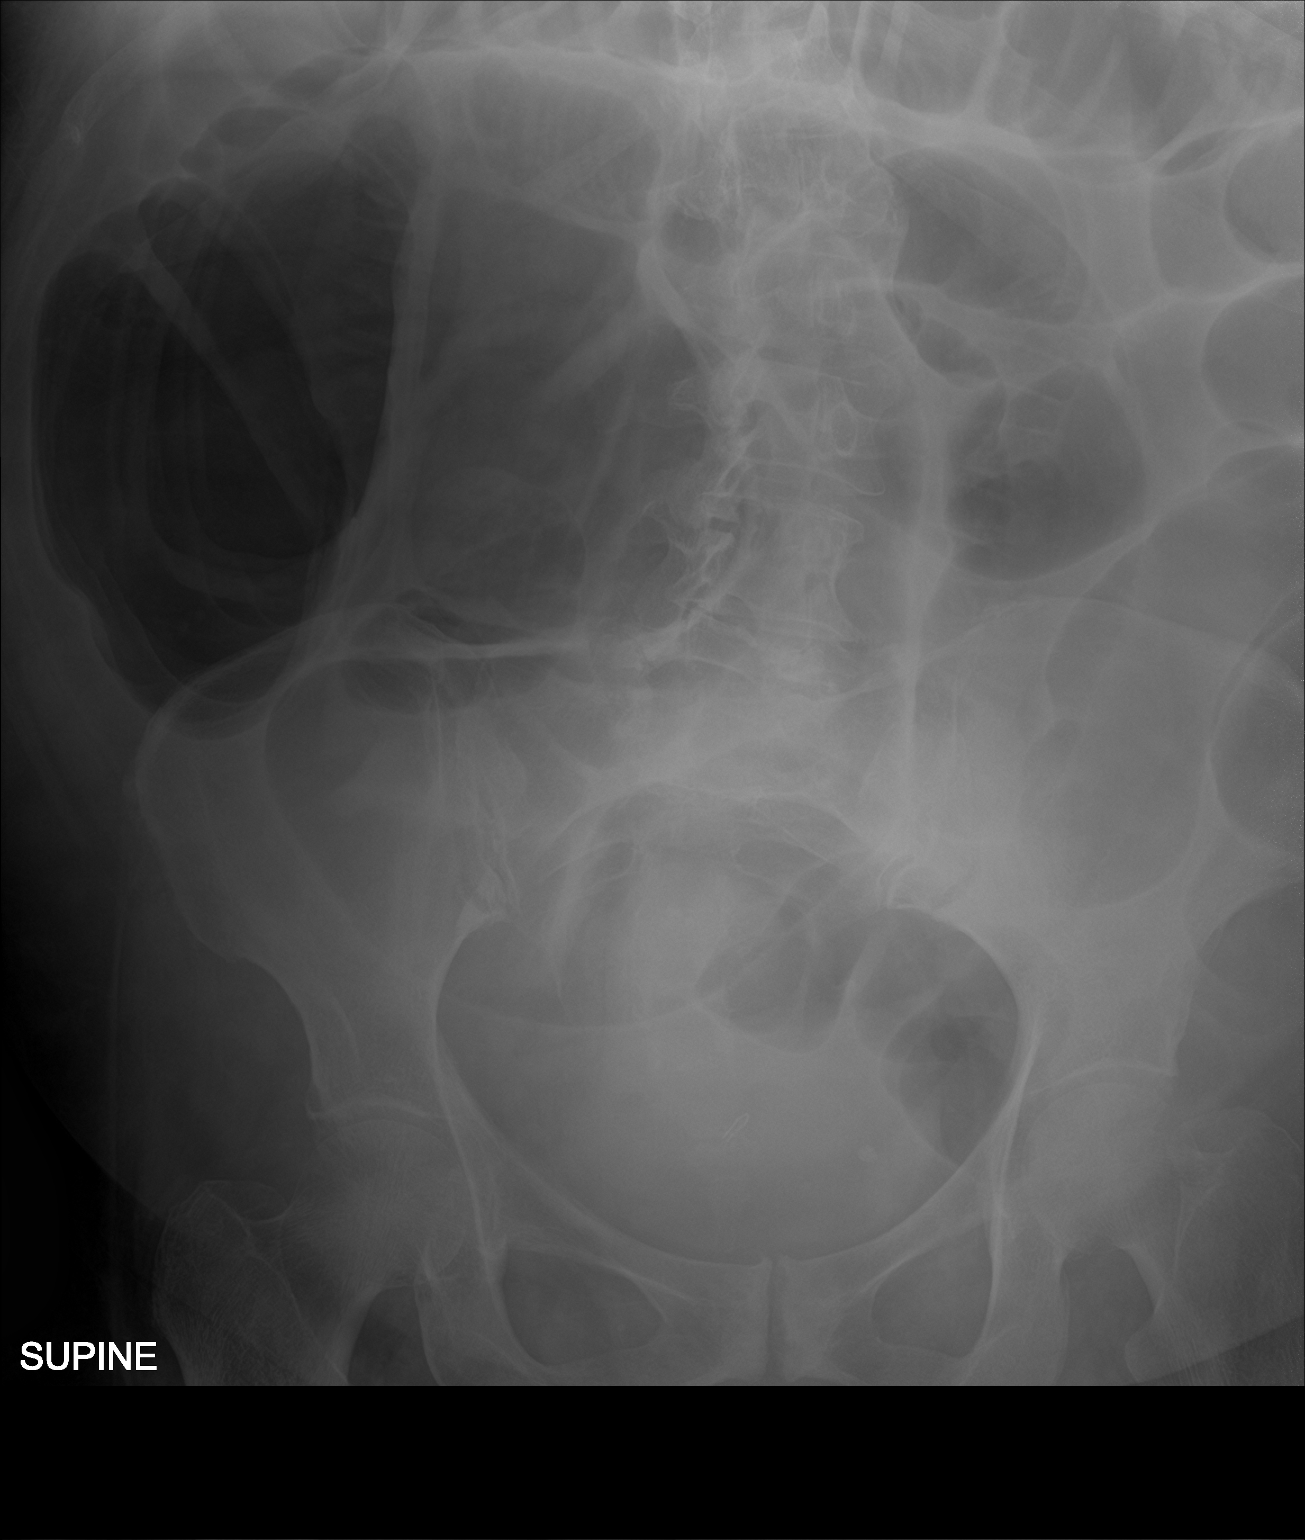

[abdomen kub (2 of 2)]
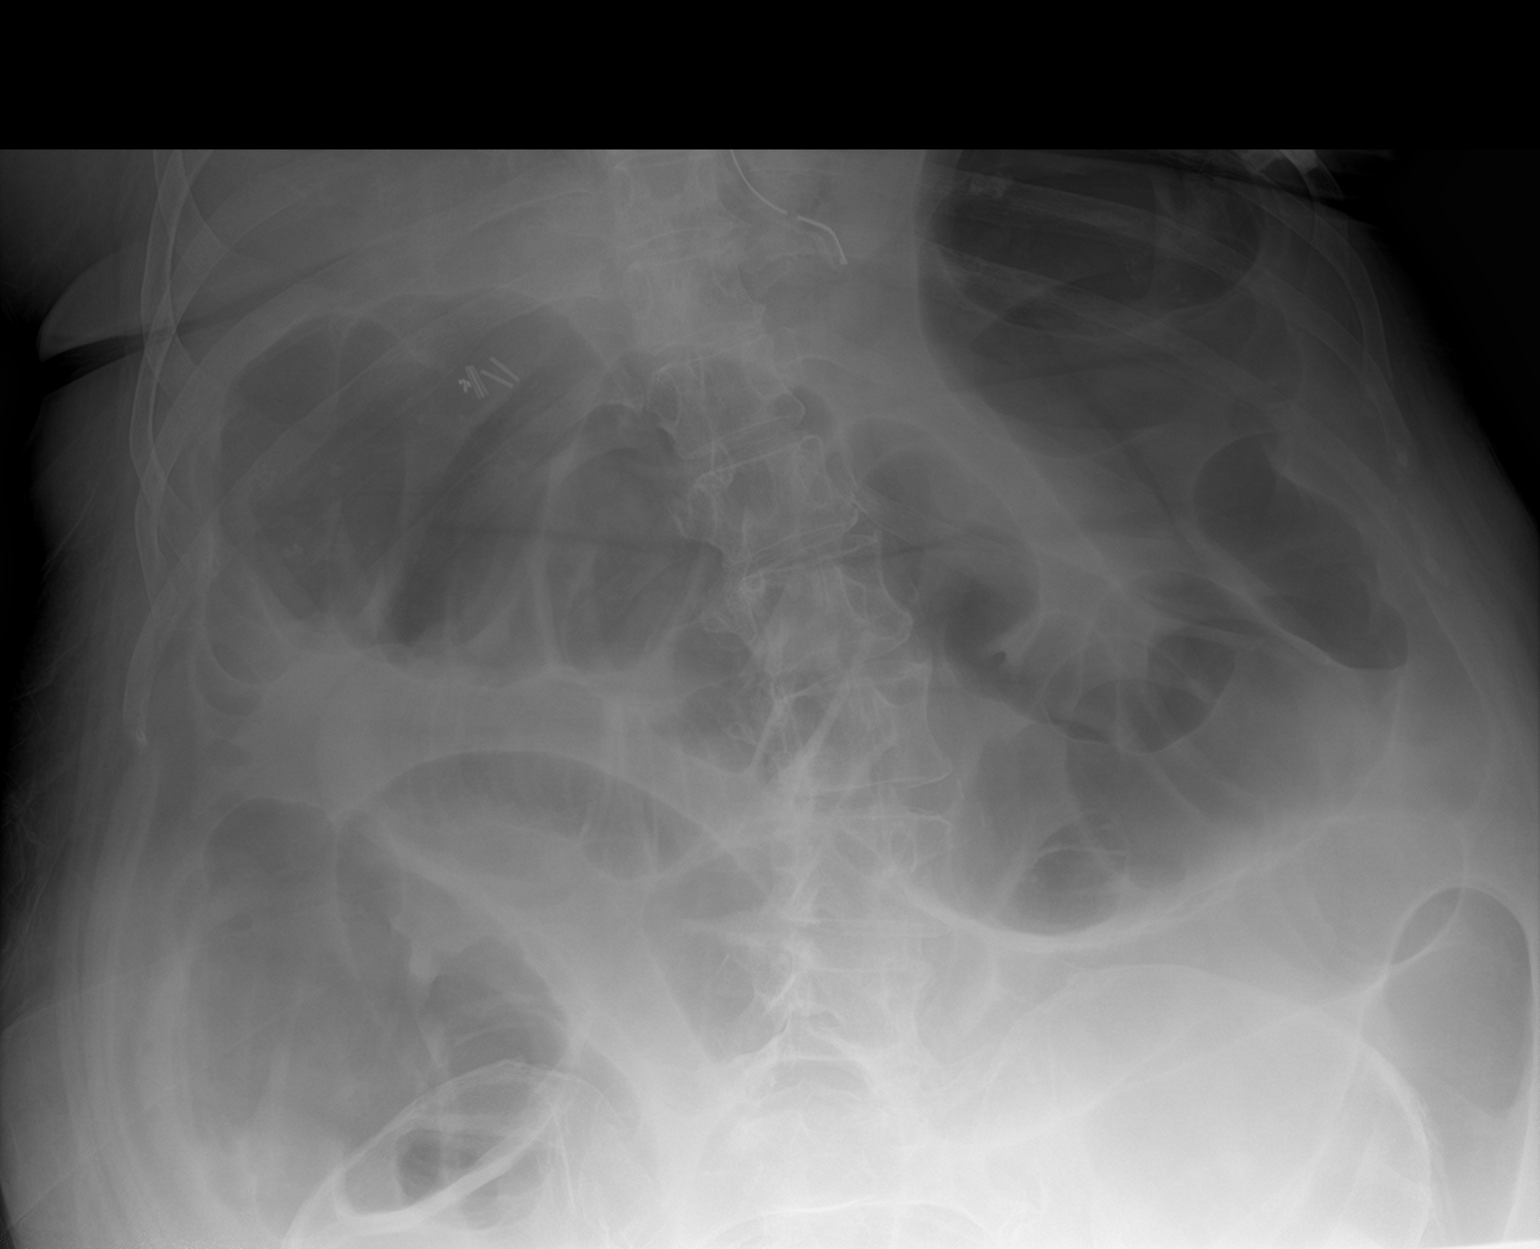

[2 of 2 positions shown; findings below may reference images not displayed]

FINDINGS: NG in the proximal stomach near the GE junction. Recommend advancing
into the stomach. Surgical clips in the gallbladder fossa

Progressive dilatation of large and small bowel.

Lumbar scoliosis.  No renal calculi.
IMPRESSION: Interval progression of large and small bowel dilatation most likely
ileus versus distal small bowel obstruction. NG tube in the proximal
stomach, recommend advancing NG tube.

## 2019-11-15 ENCOUNTER — Other Ambulatory Visit: Payer: Self-pay

## 2019-11-15 DIAGNOSIS — Z20822 Contact with and (suspected) exposure to covid-19: Secondary | ICD-10-CM

## 2019-11-19 LAB — NOVEL CORONAVIRUS, NAA: SARS-CoV-2, NAA: NOT DETECTED

## 2020-01-11 ENCOUNTER — Other Ambulatory Visit: Payer: Self-pay | Admitting: Family Medicine

## 2020-01-11 DIAGNOSIS — Z1231 Encounter for screening mammogram for malignant neoplasm of breast: Secondary | ICD-10-CM

## 2020-01-16 ENCOUNTER — Other Ambulatory Visit: Payer: Self-pay

## 2020-01-16 ENCOUNTER — Ambulatory Visit
Admission: RE | Admit: 2020-01-16 | Discharge: 2020-01-16 | Disposition: A | Discharge status: Home / Self Care | Payer: 59 | Source: Ambulatory Visit | Attending: Family Medicine | Admitting: Family Medicine

## 2020-01-16 DIAGNOSIS — Z1231 Encounter for screening mammogram for malignant neoplasm of breast: Secondary | ICD-10-CM

## 2020-02-08 ENCOUNTER — Ambulatory Visit: Payer: 59 | Attending: Internal Medicine

## 2020-02-08 DIAGNOSIS — Z23 Encounter for immunization: Secondary | ICD-10-CM

## 2020-02-08 NOTE — Progress Notes (Signed)
   Covid-19 Vaccination Clinic  Name:  CECILEY SAILORS    MRN: [id number] DOB: Mar 07, 1956  02/08/2020  Ms. Tallerico was observed post Covid-19 immunization for 15 minutes without incident. She was provided with Vaccine Information Sheet and instruction to access the V-Safe system.   Ms. Hickox was instructed to call 911 with any severe reactions post vaccine: [place] Kitchen Difficulty breathing  . Swelling of face and throat  . A fast heartbeat  . A bad rash all over body  . Dizziness and weakness   Immunizations Administered    Name Date Dose VIS Date Route   Pfizer COVID-19 Vaccine 02/08/2020  1:36 PM 0.3 mL 10/26/2019 Intramuscular   Manufacturer: [place]   Lot: [id number]   [place]: [id number]

## 2020-03-04 ENCOUNTER — Ambulatory Visit: Payer: 59 | Attending: Internal Medicine

## 2020-03-04 DIAGNOSIS — Z23 Encounter for immunization: Secondary | ICD-10-CM

## 2020-03-04 NOTE — Progress Notes (Signed)
   Covid-19 Vaccination Clinic  Name:  Hannah Jefferson    MRN: [id number] DOB: 11-09-1956  03/04/2020  Hannah Jefferson was observed post Covid-19 immunization for 15 minutes without incident. She was provided with Vaccine Information Sheet and instruction to access the V-Safe system.   Hannah Jefferson was instructed to call 911 with any severe reactions post vaccine: [place] Kitchen Difficulty breathing  . Swelling of face and throat  . A fast heartbeat  . A bad rash all over body  . Dizziness and weakness   Immunizations Administered    Name Date Dose VIS Date Route   Pfizer COVID-19 Vaccine 03/04/2020 12:04 PM 0.3 mL 01/09/2019 Intramuscular   Manufacturer: [place]   Lot: [id number]   [place]: [id number]

## 2020-11-17 DIAGNOSIS — R2689 Other abnormalities of gait and mobility: Secondary | ICD-10-CM | POA: Diagnosis not present

## 2020-11-17 DIAGNOSIS — Z20822 Contact with and (suspected) exposure to covid-19: Secondary | ICD-10-CM | POA: Diagnosis not present

## 2020-11-17 DIAGNOSIS — M25531 Pain in right wrist: Secondary | ICD-10-CM | POA: Diagnosis not present

## 2020-11-17 DIAGNOSIS — Z03818 Encounter for observation for suspected exposure to other biological agents ruled out: Secondary | ICD-10-CM | POA: Diagnosis not present

## 2020-11-17 DIAGNOSIS — Z9181 History of falling: Secondary | ICD-10-CM | POA: Diagnosis not present

## 2020-11-24 ENCOUNTER — Encounter: Payer: Self-pay | Admitting: Rehabilitation

## 2020-11-24 NOTE — Therapy (Signed)
[facility] [address] [place], [place], [address] Phone: [phone]   Fax:  [phone]  Patient Details  Name: Hannah Jefferson MRN: [id number] Date of Birth: 02-Jun-1956 Referring Provider:  No ref. provider found  Encounter Date: 11/24/2020   PHYSICAL THERAPY DISCHARGE SUMMARY  Visits from Start of Care: 20  Current functional level related to goals / functional outcomes:  PT Long Term Goals - 10/03/18 1200      PT LONG TERM GOAL #1   Title Pt will amb. 1000' over even/paved terrain with LRAD at MOD I level to improve functional mobility. TARGET DATE FOR ALL LTGS: 08/30/18    Baseline 10/14: Patient ambulates 1000' over paved terrain with her cane at Mod I level     Status Achieved      PT LONG TERM GOAL #2   Title Trial amb. without AD when appropriate and write goal as indicated.     Status Achieved      PT LONG TERM GOAL #3   Title Pt will report no falls in the last 4 weeks to improve safety.     Baseline 10/14: Patient reports no falls in the last 4 weeks    Status Achieved      PT LONG TERM GOAL #4   Title Pt will improve BERG balance score to >/=51/56 in order to indicate decreased fall risk. (updated goal date: 10/13/18)    Baseline 47/56 on 10/14, 51/56 on 10/03/18    Time 5    Period Weeks    Status Achieved      PT LONG TERM GOAL #5   Title Pt will perform floor recovery tranfser at mod I level in order to indicate safety in case of fall but also to ensure safety when playing in floor with family.     Baseline S on 10/14, mod I on 10/03/18    Time 5    Period Weeks    Status Achieved      PT LONG TERM GOAL #6   Title Pt will ambulate x 150' level indoor surfaces while negotiating turns, performing head motions and with environmental distractions at mod I level without AD in order to indicate improved functional mobility.    S level    Time 5    Period Weeks    Status Partially Met       PT LONG TERM GOAL #7   Title Pt will negotiate up/down 8 stairs with single rail in reciprocal pattern at mod I level in order to indicate improved home and community mobility as well as LE strength.    needs B rails to descend   Time 5    Period Weeks    Status Partially Met             Remaining deficits: Unsure as she has not returned.  Kept episode of care open in case she felt that she needed to return.    Education / Equipment: HEP   Plan: Patient agrees to discharge.  Patient goals were partially met. Patient is being discharged due to being pleased with the current functional level.  ?????    Cameron Sprang, PT, MPT [facility] [address] [place], [place], [address] Phone: [phone]   Fax:  [phone] 11/24/20, 10:58 AM

## 2020-12-23 ENCOUNTER — Other Ambulatory Visit: Payer: Self-pay

## 2020-12-23 ENCOUNTER — Ambulatory Visit: Payer: Medicare Other | Attending: Family Medicine | Admitting: Physical Therapy

## 2020-12-23 DIAGNOSIS — R2689 Other abnormalities of gait and mobility: Secondary | ICD-10-CM | POA: Diagnosis not present

## 2020-12-23 DIAGNOSIS — R2681 Unsteadiness on feet: Secondary | ICD-10-CM

## 2020-12-23 DIAGNOSIS — R208 Other disturbances of skin sensation: Secondary | ICD-10-CM | POA: Insufficient documentation

## 2020-12-23 DIAGNOSIS — M6281 Muscle weakness (generalized): Secondary | ICD-10-CM | POA: Diagnosis not present

## 2020-12-23 DIAGNOSIS — R296 Repeated falls: Secondary | ICD-10-CM

## 2020-12-23 NOTE — Therapy (Signed)
[facility] [address] [place], [place], [address] Phone: [phone]   Fax:  [phone]  Physical Therapy Evaluation  Patient Details  Name: CEARA WRIGHTSON MRN: [id number] Date of Birth: [birth year] Referring Provider (PT): Carol Ada, MD   Encounter Date: 12/23/2020   PT End of Session - 12/23/20 1936    Visit Number 1    Number of Visits 9    Date for PT Re-Evaluation 02/21/21    Authorization Type UHC Medicare; $30 copay    Progress Note Due on Visit 10    PT Start Time 0937    PT Stop Time 1020    PT Time Calculation (min) 43 min    Activity Tolerance Patient tolerated treatment well    Behavior During Therapy [facility] for tasks assessed/performed           Past Medical History:  Diagnosis Date  . Chronic left hip pain   . GERD (gastroesophageal reflux disease)   . Hearing loss of both ears   . Hypertension   . Neuropathy    numbness/tingling from waist down  . Vision abnormalities     Past Surgical History:  Procedure Laterality Date  . IR IVC FILTER PLMT / S&I /IMG GUID/MOD SED  04/05/2018  . IR IVC FILTER RETRIEVAL / S&I /IMG GUID/MOD SED  05/10/2019  . IR RADIOLOGIST EVAL & MGMT  05/02/2019  . LAPAROSCOPIC CHOLECYSTECTOMY  1992  . REFRACTIVE SURGERY Bilateral 2004    There were no vitals filed for this visit.    Subjective Assessment - 12/23/20 0941    Subjective Pt completed PT and OT in 2019 for thoracic meningioma.  Still having issues with falls, strength to get up off the floor when she has a fall, impaired sensation, difficulty standing for >15 minutes -experiences pain and mm spasms down L side of the back and hip (cooking supper, cleaning, washing dishes, etc.), and R knee instability -"gives out."    Patient is accompained by: Family member   son   Pertinent History T7-T8 meningioma, Chronic L hip pain, hearing loss, HTN, neuropathy, vision abnormalities, IVC Filter placement for DVT  of R femoral vein, L wrist fracture    How long can you stand comfortably? 15 minutes    How long can you walk comfortably? 5,000 steps a day; a mile outside with son and daughter - in - law    Diagnostic tests Goes once a year for MRI to check for more tumors; MRI was clear - has scar tissue pressing against spine    Patient Stated Goals To decrease falls risk, to improve LE strength and to increase overall standing endurance    Currently in Pain? No/denies              [facility] PT Assessment - 12/23/20 0948      Assessment   Medical Diagnosis Falls/Imbalance    Referring Provider (PT) Carol Ada, MD    Onset Date/Surgical Date 11/25/20    Prior Therapy yes for thoracic meningioma      Precautions   Precautions Other (comment);Fall    Precaution Comments T7-T8 meningioma, Chronic L hip pain, hearing loss, HTN, neuropathy, vision abnormalities, IVC Filter placement for DVT of R femoral vein, L wrist fracture      Balance Screen   Has the patient fallen in the past 6 months Yes    How many times? [place] residence  Living Arrangements Spouse/significant other    Type of [place] - single point    Additional Comments Able to drive but usually has family drive her around; using cane outside the home, no cane in the house.      Prior Function   Level of Independence Independent with basic ADLs;Independent with household mobility without device;Independent with community mobility with device;Independent with homemaking with ambulation;Independent with transfers    Vocation On disability    Leisure Walking a mile with son and daughter in law      Sensation   Light Touch Impaired Detail    Light Touch Impaired Details Impaired RLE;Impaired LLE    Proprioception Impaired by gross assessment      Tone   Assessment Location Right Lower Extremity;Left  Lower Extremity      ROM / Strength   AROM / PROM / Strength Strength      Strength   Overall Strength Deficits    Overall Strength Comments RLE: 4-/5  LLE: 3+/5      Transfers   Comments --      Ambulation/Gait   Ambulation/Gait Yes    Ambulation/Gait Assistance 5: Supervision    Ambulation/Gait Assistance Details use of cane    Ambulation Distance (Feet) 115 Feet    Assistive device Straight cane    Gait Pattern Step-through pattern;Decreased stride length;Trendelenburg    Ambulation Surface Level;Indoor      Standardized Balance Assessment   Standardized Balance Assessment Berg Balance Test;10 meter walk test;Five Times Sit to Stand    Five times sit to stand comments  30 second sit to stand: 5 repetitions without use of UE.  Multiple attempts to stand without UE    10 Meter Walk with cane: 14.16 seconds or 2.3 ft/sec      Berg Balance Test   Sit to Stand Able to stand  independently using hands    Standing Unsupported Able to stand safely 2 minutes    Sitting with Back Unsupported but Feet Supported on Floor or Stool Able to sit safely and securely 2 minutes    Stand to Sit Controls descent by using hands    Transfers Able to transfer safely, minor use of hands    Standing Unsupported with Eyes Closed Able to stand 10 seconds with supervision    Standing Unsupported with Feet Together Able to place feet together independently and stand for 1 minute with supervision    From Standing, Reach Forward with Outstretched Arm Can reach confidently >25 cm (10")    From Standing Position, Pick up Object from Floor Able to pick up shoe, needs supervision    From Standing Position, Turn to Look Behind Over each Shoulder Looks behind one side only/other side shows less weight shift    Turn 360 Degrees Needs close supervision or verbal cueing    Standing Unsupported, Alternately Place Feet on Step/Stool Able to complete >2 steps/needs minimal assist    Standing Unsupported, One Foot in  Front Able to take small step independently and hold 30 seconds    Standing on One Leg Able to lift leg independently and hold equal to or more than 3 seconds    Total Score 40    Berg comment: 40/56      RLE Tone   RLE Tone Mild;Hypertonic      LLE Tone   LLE Tone Mild;Hypertonic  Objective measurements completed on examination: See above findings.               PT Education - 12/23/20 1935    Education Details clinical findings, PT POC and goals    Person(s) Educated Patient;Child(ren)    Methods Explanation    Comprehension Verbalized understanding            PT Short Term Goals - 12/23/20 1944      PT SHORT TERM GOAL #1   Title Pt will demonstrate independence with initial HEP for strength, balance, and endurance    Time 4    Period Weeks    Status New    Target Date 01/22/21      PT SHORT TERM GOAL #2   Title Pt will improve 30 second sit to stand from chair with no UE to 10 reps to indicate improved strength and endurance    Baseline 5 reps    Time 4    Period Weeks    Status New    Target Date 01/22/21      PT SHORT TERM GOAL #3   Title Pt will improve gait velocity with cane to 2.6 ft/sec to indicate decreased falls risk    Baseline 2.3 ft/sec with cane    Time 4    Period Weeks    Status New    Target Date 01/22/21      PT SHORT TERM GOAL #4   Title Pt will improve BERG by 4 points to indicate decreased falls risk    Baseline 40/56    Time 4    Period Weeks    Status New    Target Date 01/22/21             PT Long Term Goals - 12/23/20 1948      PT LONG TERM GOAL #1   Title Pt will demonstrate independence with final HEP and will report return to walking 1 mile a day with son and daughter in law    Time 8    Period Weeks    Status New    Target Date 02/21/21      PT LONG TERM GOAL #2   Title Pt will demonstrate ability to stand from floor using UE on furniture with supervision    Time 8     Period Weeks    Status New    Target Date 02/21/21      PT LONG TERM GOAL #3   Title Pt will improve 30 second sit to stand to 15 reps without use of UE to improve functional LE strength    Time 8    Period Weeks    Status New    Target Date 02/21/21      PT LONG TERM GOAL #4   Title Pt will improve BERG balance to >/= 48/56 to indicate decreased falls risk    Time 8    Period Weeks    Status New    Target Date 02/21/21      PT LONG TERM GOAL #5   Title Pt will improve gait velocity with cane to >/= 3.0 ft/sec    Time 8    Period Weeks    Status New    Target Date 02/21/21      PT LONG TERM GOAL #6   Title Pt will report ability to cook, do laundry, clean, etc with minimal complaints of L back/hip cramping/spasms and will demonstrate effective stretches to address spasms  Time 8    Period Weeks    Status New                  Plan - 12/23/20 1937    Clinical Impression Statement Pt is a 65 year old female well known to this clinic referred to Neuro OPPT for evaluation of falls and imbalance.  Pt's PMH is significant for the following: T7-T8 meningioma, Chronic L hip pain, hearing loss, HTN, neuropathy, vision abnormalities, IVC Filter placement for DVT of R femoral vein, falls and L wrist fracture.  The following deficits were noted during pt's exam: impaired bilat LE strength, impaired sensation, hypertonicity, impaired standing balance, and impaired gait.  Pt's BERG balance, gait velocity and 30 seconds sit to stand scores indicate pt is at increased risk for falls. Pt would benefit from skilled PT to address these impairments and functional limitations to maximize functional mobility independence and reduce falls risk.    Personal Factors and Comorbidities Comorbidity 3+;Finances;Fitness;Past/Current Experience;Time since onset of injury/illness/exacerbation    Comorbidities T7-T8 meningioma, falls, Chronic L hip pain, hearing loss, HTN, neuropathy, vision  abnormalities, IVC Filter placement for DVT of R femoral vein, L wrist fracture    Examination-Activity Limitations Bend;Locomotion Level;Stairs;Stand    Examination-Participation Restrictions Cleaning;Community Activity;Laundry;Meal Prep    Stability/Clinical Decision Making Evolving/Moderate complexity    Clinical Decision Making Moderate    Rehab Potential Good    PT Frequency 1x / week    PT Duration 8 weeks    PT Treatment/Interventions ADLs/Self Care Home Management;Aquatic Therapy;Cryotherapy;Electrical Stimulation;Moist Heat;DME Instruction;Gait training;Stair training;Functional mobility training;Therapeutic activities;Therapeutic exercise;Balance training;Neuromuscular re-education;Patient/family education;Passive range of motion;Energy conservation;Dry needling    PT Next Visit Plan Re-initiate HEP: LE strengthening - especially proximal hip/glute med and knee on R, stretches for spasms on L side, standing endurance, training for getting up from the floor, balance exercises.    Consulted and Agree with Plan of Care Patient;Family member/caregiver    Family Member Consulted son           Patient will benefit from skilled therapeutic intervention in order to improve the following deficits and impairments:  Abnormal gait,Decreased activity tolerance,Decreased balance,Decreased strength,Difficulty walking,Increased muscle spasms,Impaired sensation,Impaired tone,Pain  Visit Diagnosis: Repeated falls  Muscle weakness (generalized)  Other disturbances of skin sensation  Other abnormalities of gait and mobility  Unsteadiness on feet     Problem List Patient Active Problem List   Diagnosis Date Noted  . Anxiety and depression 05/30/2018  . E. coli UTI 04/27/2018  . Acute blood loss anemia   . Acute deep vein thrombosis (DVT) of right femoral vein ([place])   . Hyponatremia   . Hypokalemia   . Hypomagnesemia   . Ileus, postoperative ([place])   . Thoracic myelopathy 03/21/2018   . Neurogenic bowel 03/21/2018  . Neurogenic bladder 03/21/2018  . Left wrist fracture, sequela 03/21/2018  . Numbness 02/15/2018  . Ataxic gait 02/15/2018  . Left leg weakness 02/15/2018  . Other fatigue 02/15/2018  . Urinary incontinence 02/15/2018  . Bowel incontinence 02/15/2018    Rico Junker, PT, DPT 12/23/20    7:55 PM     [facility] [address] [place], [place], [address] Phone: [phone]   Fax:  [phone]  Name: LEATA DOMINY MRN: [id number] Date of Birth: Jun 25, 1956

## 2021-01-05 ENCOUNTER — Ambulatory Visit: Payer: Medicare Other | Admitting: Physical Therapy

## 2021-01-05 ENCOUNTER — Encounter: Payer: Self-pay | Admitting: Physical Therapy

## 2021-01-05 ENCOUNTER — Other Ambulatory Visit: Payer: Self-pay

## 2021-01-05 DIAGNOSIS — R2681 Unsteadiness on feet: Secondary | ICD-10-CM | POA: Diagnosis not present

## 2021-01-05 DIAGNOSIS — R2689 Other abnormalities of gait and mobility: Secondary | ICD-10-CM | POA: Diagnosis not present

## 2021-01-05 DIAGNOSIS — M6281 Muscle weakness (generalized): Secondary | ICD-10-CM | POA: Diagnosis not present

## 2021-01-05 DIAGNOSIS — R296 Repeated falls: Secondary | ICD-10-CM | POA: Diagnosis not present

## 2021-01-05 DIAGNOSIS — R208 Other disturbances of skin sensation: Secondary | ICD-10-CM | POA: Diagnosis not present

## 2021-01-05 NOTE — Patient Instructions (Signed)
Access Code: [email or web address] URL: [email or web address] Date: 01/05/2021 Prepared by: Misty Stanley  Exercises Seated Hamstring Stretch with Strap - 1 x daily - 7 x weekly - 2 sets - 30 hold Seated Quadratus Lumborum Stretch with Arm Overhead - 1 x daily - 7 x weekly - 2 sets - 20 seconds hold Seated Trunk Rotation - 1 x daily - 7 x weekly - 2 sets - 10 seconds hold Supine Straight Leg Raises - 1 x daily - 7 x weekly - 2 sets - 10 reps Sidelying Reverse Clamshell - 1 x daily - 7 x weekly - 2 sets - 5 reps Standing Terminal Knee Extension with Resistance - 1 x daily - 7 x weekly - 2 sets - 10 reps

## 2021-01-05 NOTE — Therapy (Signed)
[facility] [address] [place], [place], [address] Phone: [phone]   Fax:  [phone]  Physical Therapy Treatment  Patient Details  Name: Hannah Jefferson MRN: [id number] Date of Birth: [birth year] Referring Provider (PT): Carol Ada, MD   Encounter Date: 01/05/2021   PT End of Session - 01/05/21 1633    Visit Number 2    Number of Visits 9    Date for PT Re-Evaluation 02/21/21    Authorization Type UHC Medicare; $30 copay    Progress Note Due on Visit 10    PT Start Time 1532    PT Stop Time 1615    PT Time Calculation (min) 43 min    Activity Tolerance Patient tolerated treatment well    Behavior During Therapy [facility] for tasks assessed/performed           Past Medical History:  Diagnosis Date  . Chronic left hip pain   . GERD (gastroesophageal reflux disease)   . Hearing loss of both ears   . Hypertension   . Neuropathy    numbness/tingling from waist down  . Vision abnormalities     Past Surgical History:  Procedure Laterality Date  . IR IVC FILTER PLMT / S&I /IMG GUID/MOD SED  04/05/2018  . IR IVC FILTER RETRIEVAL / S&I /IMG GUID/MOD SED  05/10/2019  . IR RADIOLOGIST EVAL & MGMT  05/02/2019  . LAPAROSCOPIC CHOLECYSTECTOMY  1992  . REFRACTIVE SURGERY Bilateral 2004    There were no vitals filed for this visit.   Subjective Assessment - 01/05/21 1534    Subjective No falls since evaluation.  Took an anti-spasmodic before coming to therapy due to spasms in L foot.    Patient is accompained by: Family member   son   Pertinent History T7-T8 meningioma, Chronic L hip pain, hearing loss, HTN, neuropathy, vision abnormalities, IVC Filter placement for DVT of R femoral vein, L wrist fracture    How long can you stand comfortably? 15 minutes    How long can you walk comfortably? 5,000 steps a day; a mile outside with son and daughter - in - law    Diagnostic tests Goes once a year for MRI to check for  more tumors; MRI was clear - has scar tissue pressing against spine    Patient Stated Goals To decrease falls risk, to improve LE strength and to increase overall standing endurance    Currently in Pain? No/denies           Initiated HEP focusing on stretches for L side spasms, proximal hip strengthening and R knee strengthening to decreased "buckling".  Pt return demonstrated each exercise with son observing set up and therapist cues for correct mm activation and to decrease compensations.  Pt reported slight tightness in L lower back at end of session; recommended at home pt perform exercises first and then stretches.  Pt verbalized understanding.  Access Code: [email or web address] URL: [email or web address] Date: 01/05/2021 Prepared by: Misty Stanley  Exercises Seated Hamstring Stretch with Strap - 1 x daily - 7 x weekly - 2 sets - 30 hold Seated Quadratus Lumborum Stretch with Arm Overhead - 1 x daily - 7 x weekly - 2 sets - 20 seconds hold Seated Trunk Rotation - 1 x daily - 7 x weekly - 2 sets - 10 seconds hold Supine Straight Leg Raises - 1 x daily - 7 x weekly - 2 sets - 10 reps Sidelying Reverse Clamshell - 1 x daily -  7 x weekly - 2 sets - 5 reps Standing Terminal Knee Extension with Resistance - 1 x daily - 7 x weekly - 2 sets - 10 reps      PT Education - 01/05/21 1632    Education Details initiated HEP    Person(s) Educated Patient;Child(ren)    Methods Explanation;Demonstration;Handout    Comprehension Verbalized understanding;Returned demonstration            PT Short Term Goals - 12/23/20 1944      PT SHORT TERM GOAL #1   Title Pt will demonstrate independence with initial HEP for strength, balance, and endurance    Time 4    Period Weeks    Status New    Target Date 01/22/21      PT SHORT TERM GOAL #2   Title Pt will improve 30 second sit to stand from chair with no UE to 10 reps to indicate improved strength and endurance    Baseline 5 reps    Time 4     Period Weeks    Status New    Target Date 01/22/21      PT SHORT TERM GOAL #3   Title Pt will improve gait velocity with cane to 2.6 ft/sec to indicate decreased falls risk    Baseline 2.3 ft/sec with cane    Time 4    Period Weeks    Status New    Target Date 01/22/21      PT SHORT TERM GOAL #4   Title Pt will improve BERG by 4 points to indicate decreased falls risk    Baseline 40/56    Time 4    Period Weeks    Status New    Target Date 01/22/21             PT Long Term Goals - 12/23/20 1948      PT LONG TERM GOAL #1   Title Pt will demonstrate independence with final HEP and will report return to walking 1 mile a day with son and daughter in law    Time 8    Period Weeks    Status New    Target Date 02/21/21      PT LONG TERM GOAL #2   Title Pt will demonstrate ability to stand from floor using UE on furniture with supervision    Time 8    Period Weeks    Status New    Target Date 02/21/21      PT LONG TERM GOAL #3   Title Pt will improve 30 second sit to stand to 15 reps without use of UE to improve functional LE strength    Time 8    Period Weeks    Status New    Target Date 02/21/21      PT LONG TERM GOAL #4   Title Pt will improve BERG balance to >/= 48/56 to indicate decreased falls risk    Time 8    Period Weeks    Status New    Target Date 02/21/21      PT LONG TERM GOAL #5   Title Pt will improve gait velocity with cane to >/= 3.0 ft/sec    Time 8    Period Weeks    Status New    Target Date 02/21/21      PT LONG TERM GOAL #6   Title Pt will report ability to cook, do laundry, clean, etc with minimal complaints of L back/hip cramping/spasms and  will demonstrate effective stretches to address spasms    Time 8    Period Weeks    Status New                 Plan - 01/05/21 1633    Clinical Impression Statement Initiated HEP with focus on stretches for L mid and low back, lateral and posterior LE, hip flexor and glute med  strengthening and R glute max and quad strengthening in WB position. During supine exercises pt experienced frequent cramping/spasms of L hamstring muscle; advised pt to keep LLE rested on towel roll when lifting RLE.  Pt also required cues to minimize use of quadratus lumborum when performing glute med strengthening.  Will continue to review and progress as appropriate.    Personal Factors and Comorbidities Comorbidity 3+;Finances;Fitness;Past/Current Experience;Time since onset of injury/illness/exacerbation    Comorbidities T7-T8 meningioma, falls, Chronic L hip pain, hearing loss, HTN, neuropathy, vision abnormalities, IVC Filter placement for DVT of R femoral vein, L wrist fracture    Examination-Activity Limitations Bend;Locomotion Level;Stairs;Stand    Examination-Participation Restrictions Cleaning;Community Activity;Laundry;Meal Prep    Stability/Clinical Decision Making Evolving/Moderate complexity    Rehab Potential Good    PT Frequency 1x / week    PT Duration 8 weeks    PT Treatment/Interventions ADLs/Self Care Home Management;Aquatic Therapy;Cryotherapy;Electrical Stimulation;Moist Heat;DME Instruction;Gait training;Stair training;Functional mobility training;Therapeutic activities;Therapeutic exercise;Balance training;Neuromuscular re-education;Patient/family education;Passive range of motion;Energy conservation;Dry needling    PT Next Visit Plan How is HEP going?  Any exercises need reviewing or revising?  Continue to work on LE strengthening - especially proximal hip/glute med and knee on R, stretches for spasms on L side, standing endurance, training for getting up from the floor, balance exercises.    Consulted and Agree with Plan of Care Patient;Family member/caregiver    Family Member Consulted son           Patient will benefit from skilled therapeutic intervention in order to improve the following deficits and impairments:  Abnormal gait,Decreased activity tolerance,Decreased  balance,Decreased strength,Difficulty walking,Increased muscle spasms,Impaired sensation,Impaired tone,Pain  Visit Diagnosis: Repeated falls  Muscle weakness (generalized)  Other disturbances of skin sensation  Other abnormalities of gait and mobility  Unsteadiness on feet     Problem List Patient Active Problem List   Diagnosis Date Noted  . Anxiety and depression 05/30/2018  . E. coli UTI 04/27/2018  . Acute blood loss anemia   . Acute deep vein thrombosis (DVT) of right femoral vein ([place])   . Hyponatremia   . Hypokalemia   . Hypomagnesemia   . Ileus, postoperative ([place])   . Thoracic myelopathy 03/21/2018  . Neurogenic bowel 03/21/2018  . Neurogenic bladder 03/21/2018  . Left wrist fracture, sequela 03/21/2018  . Numbness 02/15/2018  . Ataxic gait 02/15/2018  . Left leg weakness 02/15/2018  . Other fatigue 02/15/2018  . Urinary incontinence 02/15/2018  . Bowel incontinence 02/15/2018   Rico Junker, PT, DPT 01/05/21    4:46 PM    [facility] [address] [place], [place], [address] Phone: [phone]   Fax:  [phone]  Name: Hannah Jefferson MRN: [id number] Date of Birth: 1956-08-03

## 2021-01-12 ENCOUNTER — Ambulatory Visit: Payer: Medicare Other | Admitting: Rehabilitation

## 2021-01-12 ENCOUNTER — Encounter: Payer: Self-pay | Admitting: Rehabilitation

## 2021-01-12 ENCOUNTER — Other Ambulatory Visit: Payer: Self-pay

## 2021-01-12 DIAGNOSIS — R2681 Unsteadiness on feet: Secondary | ICD-10-CM | POA: Diagnosis not present

## 2021-01-12 DIAGNOSIS — R208 Other disturbances of skin sensation: Secondary | ICD-10-CM

## 2021-01-12 DIAGNOSIS — R2689 Other abnormalities of gait and mobility: Secondary | ICD-10-CM | POA: Diagnosis not present

## 2021-01-12 DIAGNOSIS — M6281 Muscle weakness (generalized): Secondary | ICD-10-CM | POA: Diagnosis not present

## 2021-01-12 DIAGNOSIS — R296 Repeated falls: Secondary | ICD-10-CM | POA: Diagnosis not present

## 2021-01-12 NOTE — Therapy (Signed)
[facility] [address] [place], [place], [address] Phone: [phone]   Fax:  [phone]  Physical Therapy Treatment  Patient Details  Name: Hannah Jefferson MRN: [id number] Date of Birth: 1956-07-06 Referring Provider (PT): Carol Ada, MD   Encounter Date: 01/12/2021   PT End of Session - 01/12/21 1200    Visit Number 3    Number of Visits 9    Date for PT Re-Evaluation 02/21/21    Authorization Type UHC Medicare; $30 copay    Progress Note Due on Visit 10    PT Start Time 1102    PT Stop Time 1149    PT Time Calculation (min) 47 min    Activity Tolerance Patient tolerated treatment well    Behavior During Therapy [facility] for tasks assessed/performed           Past Medical History:  Diagnosis Date  . Chronic left hip pain   . GERD (gastroesophageal reflux disease)   . Hearing loss of both ears   . Hypertension   . Neuropathy    numbness/tingling from waist down  . Vision abnormalities     Past Surgical History:  Procedure Laterality Date  . IR IVC FILTER PLMT / S&I /IMG GUID/MOD SED  04/05/2018  . IR IVC FILTER RETRIEVAL / S&I /IMG GUID/MOD SED  05/10/2019  . IR RADIOLOGIST EVAL & MGMT  05/02/2019  . LAPAROSCOPIC CHOLECYSTECTOMY  1992  . REFRACTIVE SURGERY Bilateral 2004    There were no vitals filed for this visit.   Subjective Assessment - 01/12/21 1104    Subjective Pain is better today.  No pain meds today before sessions.  No falls since last visit.    Pertinent History T7-T8 meningioma, Chronic L hip pain, hearing loss, HTN, neuropathy, vision abnormalities, IVC Filter placement for DVT of R femoral vein, L wrist fracture    How long can you walk comfortably? 5,000 steps a day; a mile outside with son and daughter - in - law    Diagnostic tests Goes once a year for MRI to check for more tumors; MRI was clear - has scar tissue pressing against spine    Patient Stated Goals To decrease falls  risk, to improve LE strength and to increase overall standing endurance    Currently in Pain? No/denies                             [facility] Adult PT Treatment/Exercise - 01/12/21 1125      Transfers   Transfers Sit to Stand;Stand to Sit    Comments sit<>stand at end of session for BLE strengthening with reaching arms forward to encourage forward weight shift.  Performed x 10 reps with cues for slow controlled descent.  Much improvement with cues.      Self-Care   Self-Care Other Self-Care Comments    Other Self-Care Comments  Discussed knee supports (PT thinking knee cage) but concerned that this would cause her to buckle?? She reports she has a knee brace at home and would bring to session to see if this compression would help proprioception at knee to prevent hyperextension.  At home she frequently locks knee and then when going to move she unlocks forcefully causing pain and instability leading to near falls.  We also discussed that she typicallly wears small heel wedge in right shoe as she has scoliosis and leg length descrepancy from this.  She did not  have this during this session.  this should hopefully help with L low back/side pain as well.  Pt verbalized understanding.      Neuro Re-ed    Neuro Re-ed Details  Tall kneeling/half kneeling/quadruped exercises for targeted proximal R hip/glute strength on floor on mats by mat table (as well as working to get up from floor):  Tall kneeling L and R hip abd lifts x 10 reps each with UE support, L half kneeling with R hand support on mat, L hand support on physioball with LUE raises x 10, B hands on ball with alt UE raises x 10 reps, followed by lifting ball with elbows ext x 5 reps.  Quadruped fire hydrant LLE x 10 reps, LE extension x 10 reps on each side.  Cues for core activation and less side/side weight shift.  During rest breaks, had pt transition down to seated position on mats working to get back into tall kneeling with min  A each time (3 reps) and then finally transitioning from tall kneel>half kneel>stand (hands on mat). Pt needing min A again for getting to tall kneel and then only min A for half kneel to stand transition. Did without shoes donned (on mat) to reduce friction from shoes.                  PT Education - 01/12/21 1200    Education Details see self care section    Person(s) Educated Patient;Child(ren)    Methods Explanation;Demonstration    Comprehension Verbalized understanding;Verbal cues required            PT Short Term Goals - 12/23/20 1944      PT SHORT TERM GOAL #1   Title Pt will demonstrate independence with initial HEP for strength, balance, and endurance    Time 4    Period Weeks    Status New    Target Date 01/22/21      PT SHORT TERM GOAL #2   Title Pt will improve 30 second sit to stand from chair with no UE to 10 reps to indicate improved strength and endurance    Baseline 5 reps    Time 4    Period Weeks    Status New    Target Date 01/22/21      PT SHORT TERM GOAL #3   Title Pt will improve gait velocity with cane to 2.6 ft/sec to indicate decreased falls risk    Baseline 2.3 ft/sec with cane    Time 4    Period Weeks    Status New    Target Date 01/22/21      PT SHORT TERM GOAL #4   Title Pt will improve BERG by 4 points to indicate decreased falls risk    Baseline 40/56    Time 4    Period Weeks    Status New    Target Date 01/22/21             PT Long Term Goals - 12/23/20 1948      PT LONG TERM GOAL #1   Title Pt will demonstrate independence with final HEP and will report return to walking 1 mile a day with son and daughter in law    Time 8    Period Weeks    Status New    Target Date 02/21/21      PT LONG TERM GOAL #2   Title Pt will demonstrate ability to stand from floor using UE on furniture with  supervision    Time 8    Period Weeks    Status New    Target Date 02/21/21      PT LONG TERM GOAL #3   Title Pt will  improve 30 second sit to stand to 15 reps without use of UE to improve functional LE strength    Time 8    Period Weeks    Status New    Target Date 02/21/21      PT LONG TERM GOAL #4   Title Pt will improve BERG balance to >/= 48/56 to indicate decreased falls risk    Time 8    Period Weeks    Status New    Target Date 02/21/21      PT LONG TERM GOAL #5   Title Pt will improve gait velocity with cane to >/= 3.0 ft/sec    Time 8    Period Weeks    Status New    Target Date 02/21/21      PT LONG TERM GOAL #6   Title Pt will report ability to cook, do laundry, clean, etc with minimal complaints of L back/hip cramping/spasms and will demonstrate effective stretches to address spasms    Time 8    Period Weeks    Status New                 Plan - 01/12/21 1201    Clinical Impression Statement Skilled session focused on brief L mid/low back stretch at mat (as in HEP) and also in childs pose prior to session.  Then focused on NMR in tall kneeling/half kneel/quadruped for proximal R and L hip strengthening/activation as well as practice getting up from floor.  Pt moderately fatigued, but did very well with all tasks with mild increase in knee pain which was relieved with rest breaks.    Personal Factors and Comorbidities Comorbidity 3+;Finances;Fitness;Past/Current Experience;Time since onset of injury/illness/exacerbation    Comorbidities T7-T8 meningioma, falls, Chronic L hip pain, hearing loss, HTN, neuropathy, vision abnormalities, IVC Filter placement for DVT of R femoral vein, L wrist fracture    Examination-Activity Limitations Bend;Locomotion Level;Stairs;Stand    Examination-Participation Restrictions Cleaning;Community Activity;Laundry;Meal Prep    Stability/Clinical Decision Making Evolving/Moderate complexity    Rehab Potential Good    PT Frequency 1x / week    PT Duration 8 weeks    PT Treatment/Interventions ADLs/Self Care Home Management;Aquatic  Therapy;Cryotherapy;Electrical Stimulation;Moist Heat;DME Instruction;Gait training;Stair training;Functional mobility training;Therapeutic activities;Therapeutic exercise;Balance training;Neuromuscular re-education;Patient/family education;Passive range of motion;Energy conservation;Dry needling    PT Next Visit Plan Add balance to HEP as able (she hasn't been super compliant with current HEP so maybe only add 1-2) Continue to work on LE strengthening - especially proximal hip/glute med and knee on R, stretches for spasms on L side, standing endurance, training for getting up from the floor, balance exercises.    Consulted and Agree with Plan of Care Patient;Family member/caregiver    Family Member Consulted son           Patient will benefit from skilled therapeutic intervention in order to improve the following deficits and impairments:  Abnormal gait,Decreased activity tolerance,Decreased balance,Decreased strength,Difficulty walking,Increased muscle spasms,Impaired sensation,Impaired tone,Pain  Visit Diagnosis: Repeated falls  Muscle weakness (generalized)  Other disturbances of skin sensation  Other abnormalities of gait and mobility  Unsteadiness on feet     Problem List Patient Active Problem List   Diagnosis Date Noted  . Anxiety and depression 05/30/2018  . E. coli UTI 04/27/2018  .  Acute blood loss anemia   . Acute deep vein thrombosis (DVT) of right femoral vein ([place])   . Hyponatremia   . Hypokalemia   . Hypomagnesemia   . Ileus, postoperative ([place])   . Thoracic myelopathy 03/21/2018  . Neurogenic bowel 03/21/2018  . Neurogenic bladder 03/21/2018  . Left wrist fracture, sequela 03/21/2018  . Numbness 02/15/2018  . Ataxic gait 02/15/2018  . Left leg weakness 02/15/2018  . Other fatigue 02/15/2018  . Urinary incontinence 02/15/2018  . Bowel incontinence 02/15/2018    Cameron Sprang, PT, MPT [facility] [address] [place], [place], [address] Phone: [phone]   Fax:  [phone] 01/12/21, 12:07 PM  Name: MAYLEE BARE MRN: [id number] Date of Birth: 1956-09-07

## 2021-01-19 ENCOUNTER — Ambulatory Visit: Payer: Medicare Other | Admitting: Rehabilitation

## 2021-01-22 ENCOUNTER — Ambulatory Visit: Payer: Medicare Other | Admitting: Rehabilitation

## 2021-01-26 ENCOUNTER — Other Ambulatory Visit: Payer: Self-pay

## 2021-01-26 ENCOUNTER — Ambulatory Visit: Payer: Medicare Other | Admitting: Rehabilitation

## 2021-01-26 DIAGNOSIS — R2681 Unsteadiness on feet: Secondary | ICD-10-CM | POA: Insufficient documentation

## 2021-01-26 DIAGNOSIS — M6281 Muscle weakness (generalized): Secondary | ICD-10-CM | POA: Insufficient documentation

## 2021-01-26 DIAGNOSIS — R296 Repeated falls: Secondary | ICD-10-CM | POA: Insufficient documentation

## 2021-01-26 DIAGNOSIS — R208 Other disturbances of skin sensation: Secondary | ICD-10-CM | POA: Insufficient documentation

## 2021-01-26 DIAGNOSIS — R2689 Other abnormalities of gait and mobility: Secondary | ICD-10-CM | POA: Insufficient documentation

## 2021-01-26 NOTE — Therapy (Signed)
[facility] [address] [place], [place], [address] Phone: [phone]   Fax:  [phone]  Patient Details  Name: Hannah Jefferson MRN: [id number] Date of Birth: Aug 10, 1956 Referring Provider:  Carol Ada, MD  Encounter Date: 01/26/2021   Pt arrived today with son.  She reports that she has known history of hemorrhoids, however this morning upon using restroom, had significant shooting blood from rectum.  PT attempted to call PCP x 2 however was unable to get through with anyone therefore recommend she go over there in person to hopefully be seen vs going to Urgent Care.  Rescheduled for Wednesday.    Cameron Sprang, PT, MPT [facility] [address] [place], [place], [address] Phone: [phone]   Fax:  [phone] 01/26/21, 11:27 AM

## 2021-01-28 ENCOUNTER — Ambulatory Visit: Payer: Medicare Other | Attending: Family Medicine | Admitting: Rehabilitation

## 2021-01-28 ENCOUNTER — Other Ambulatory Visit: Payer: Self-pay

## 2021-01-28 ENCOUNTER — Encounter: Payer: Self-pay | Admitting: Rehabilitation

## 2021-01-28 DIAGNOSIS — R208 Other disturbances of skin sensation: Secondary | ICD-10-CM

## 2021-01-28 DIAGNOSIS — R2689 Other abnormalities of gait and mobility: Secondary | ICD-10-CM | POA: Diagnosis not present

## 2021-01-28 DIAGNOSIS — R296 Repeated falls: Secondary | ICD-10-CM

## 2021-01-28 DIAGNOSIS — R2681 Unsteadiness on feet: Secondary | ICD-10-CM

## 2021-01-28 DIAGNOSIS — M6281 Muscle weakness (generalized): Secondary | ICD-10-CM | POA: Diagnosis not present

## 2021-01-28 NOTE — Therapy (Signed)
[facility] [address] [place], [place], [address] Phone: [phone]   Fax:  [phone]  Physical Therapy Treatment  Patient Details  Name: Hannah Jefferson MRN: [id number] Date of Birth: Sep 16, 1956 Referring Provider (PT): Carol Ada, MD   Encounter Date: 01/28/2021   PT End of Session - 01/28/21 1302    Visit Number 4    Number of Visits 9    Date for PT Re-Evaluation 02/21/21    Authorization Type UHC Medicare; $30 copay    Progress Note Due on Visit 10    PT Start Time 1143    PT Stop Time 1230    PT Time Calculation (min) 47 min    Activity Tolerance Patient tolerated treatment well    Behavior During Therapy [facility] for tasks assessed/performed           Past Medical History:  Diagnosis Date  . Chronic left hip pain   . GERD (gastroesophageal reflux disease)   . Hearing loss of both ears   . Hypertension   . Neuropathy    numbness/tingling from waist down  . Vision abnormalities     Past Surgical History:  Procedure Laterality Date  . IR IVC FILTER PLMT / S&I /IMG GUID/MOD SED  04/05/2018  . IR IVC FILTER RETRIEVAL / S&I /IMG GUID/MOD SED  05/10/2019  . IR RADIOLOGIST EVAL & MGMT  05/02/2019  . LAPAROSCOPIC CHOLECYSTECTOMY  1992  . REFRACTIVE SURGERY Bilateral 2004    There were no vitals filed for this visit.   Subjective Assessment - 01/28/21 1145    Subjective Pt reports having a good trip to [place], was able to keep up well.    Patient is accompained by: Family member    Pertinent History T7-T8 meningioma, Chronic L hip pain, hearing loss, HTN, neuropathy, vision abnormalities, IVC Filter placement for DVT of R femoral vein, L wrist fracture    How long can you stand comfortably? 15 minutes    How long can you walk comfortably? 5,000 steps a day; a mile outside with son and daughter - in - law    Diagnostic tests Goes once a year for MRI to check for more tumors; MRI was clear - has  scar tissue pressing against spine    Patient Stated Goals To decrease falls risk, to improve LE strength and to increase overall standing endurance    Currently in Pain? No/denies                             [facility] Adult PT Treatment/Exercise - 01/28/21 1151      Transfers   Transfers Sit to Stand;Stand to Sit    Sit to Stand 6: Modified independent (Device/Increase time)    Five time sit to stand comments  13.19 secs without UE support.    Stand to Sit 6: Modified independent (Device/Increase time)      Ambulation/Gait   Gait velocity 10.20 secs=3.22 ft/sec with cane, 10.40 secs=3.15 ft/sec without cane      High Level Balance   High Level Balance Activities Backward walking;Direction changes;Turns;Sudden stops   external perturbations   High Level Balance Comments Ambulation around therapy gym x 350' without device with instructions for random stopping, increased gait speed, direction changes, and external perturbations from therapist.  No overt LOB and good use of step strategy to self correct any small LOB.      Neuro Re-ed  Neuro Re-ed Details  In // bars worked on high level balance on rocker board for hip/ankle strategy as well as step strategy:  maintaining R LE on middle of small rocker board (vertically biased) advancing LLE to cone placed on 6" step with light UE support x 10 reps on each side.  Cues for forward weight shift for improved proximal hip strength and to keep knee "soft" to prevent hyperextension.  Standing on small rocker board with single limb in middle performing opposite hip abd x 10 reps with light UE support and cues to increase glute med activation.  To emphasize knee control, worked on step downs from 4" step tapping whole foot down x 10 progressing to tapping only heel down x 10 reps with light UE support throughout.  Standing on foam balance beam perpendicularly with feet apart, tapping heel forward alternating LEs x 10 reps, iniating step  stratetgy if needed with external perturbations given from PT at hips or shoulders.                    PT Short Term Goals - 01/28/21 1155      PT SHORT TERM GOAL #1   Title Pt will demonstrate independence with initial HEP for strength, balance, and endurance    Time 4    Period Weeks    Status New    Target Date 01/22/21      PT SHORT TERM GOAL #2   Title Pt will improve 30 second sit to stand from chair with no UE to 10 reps to indicate improved strength and endurance    Baseline 10 reps, 5TSS time was 13.19 secs without UE support, only needed single warm up sit<>stand prior to standing.    Time 4    Period Weeks    Status Achieved    Target Date 01/22/21      PT SHORT TERM GOAL #3   Title Pt will improve gait velocity with cane to 2.6 ft/sec to indicate decreased falls risk    Baseline 2.3 ft/sec with cane to 3.2 ft/sec with cane    Time 4    Period Weeks    Status Achieved    Target Date 01/22/21      PT SHORT TERM GOAL #4   Title Pt will improve BERG by 4 points to indicate decreased falls risk    Baseline 40/56    Time 4    Period Weeks    Status New    Target Date 01/22/21             PT Long Term Goals - 12/23/20 1948      PT LONG TERM GOAL #1   Title Pt will demonstrate independence with final HEP and will report return to walking 1 mile a day with son and daughter in law    Time 8    Period Weeks    Status New    Target Date 02/21/21      PT LONG TERM GOAL #2   Title Pt will demonstrate ability to stand from floor using UE on furniture with supervision    Time 8    Period Weeks    Status New    Target Date 02/21/21      PT LONG TERM GOAL #3   Title Pt will improve 30 second sit to stand to 15 reps without use of UE to improve functional LE strength    Time 8    Period Weeks  Status New    Target Date 02/21/21      PT LONG TERM GOAL #4   Title Pt will improve BERG balance to >/= 48/56 to indicate decreased falls risk    Time 8     Period Weeks    Status New    Target Date 02/21/21      PT LONG TERM GOAL #5   Title Pt will improve gait velocity with cane to >/= 3.0 ft/sec    Time 8    Period Weeks    Status New    Target Date 02/21/21      PT LONG TERM GOAL #6   Title Pt will report ability to cook, do laundry, clean, etc with minimal complaints of L back/hip cramping/spasms and will demonstrate effective stretches to address spasms    Time 8    Period Weeks    Status New                 Plan - 01/28/21 1302    Clinical Impression Statement Skilled session focused on assessment of gait speed and 5TSS for 30 day medicare assessment.  Pt has met and exceeded both goals, progressing very well. Remainder of session focused on NMR for high level balance and hip/knee control with balance/strength tasks.  Pt needs continuous verbal and/or tactile cues for keeping knees "unlocked."  Discussed that if she can bring her knee brace in, we may be able to set it up so that straps cross in the back to assist with this.    Personal Factors and Comorbidities Comorbidity 3+;Finances;Fitness;Past/Current Experience;Time since onset of injury/illness/exacerbation    Comorbidities T7-T8 meningioma, falls, Chronic L hip pain, hearing loss, HTN, neuropathy, vision abnormalities, IVC Filter placement for DVT of R femoral vein, L wrist fracture    Examination-Activity Limitations Bend;Locomotion Level;Stairs;Stand    Examination-Participation Restrictions Cleaning;Community Activity;Laundry;Meal Prep    Stability/Clinical Decision Making Evolving/Moderate complexity    Rehab Potential Good    PT Frequency 1x / week    PT Duration 8 weeks    PT Treatment/Interventions ADLs/Self Care Home Management;Aquatic Therapy;Cryotherapy;Electrical Stimulation;Moist Heat;DME Instruction;Gait training;Stair training;Functional mobility training;Therapeutic activities;Therapeutic exercise;Balance training;Neuromuscular  re-education;Patient/family education;Passive range of motion;Energy conservation;Dry needling    PT Next Visit Plan Check remaining STGs, Add balance to HEP as able (she hasn't been super compliant with current HEP so maybe only add 1-2) Continue to work on LE strengthening - especially proximal hip/glute med and knee on R, stretches for spasms on L side, standing endurance, training for getting up from the floor, balance exercises.    PT [place], see my blank note from 3/14-I just haven't been doing anything to make her "strain" just to be safe.  She sees MD about colonoscopy consult on 3/25    Consulted and Agree with Plan of Care Patient;Family member/caregiver    Family Member Consulted son           Patient will benefit from skilled therapeutic intervention in order to improve the following deficits and impairments:  Abnormal gait,Decreased activity tolerance,Decreased balance,Decreased strength,Difficulty walking,Increased muscle spasms,Impaired sensation,Impaired tone,Pain  Visit Diagnosis: Muscle weakness (generalized)  Repeated falls  Other disturbances of skin sensation  Other abnormalities of gait and mobility  Unsteadiness on feet     Problem List Patient Active Problem List   Diagnosis Date Noted  . Anxiety and depression 05/30/2018  . E. coli UTI 04/27/2018  . Acute blood loss anemia   . Acute deep vein thrombosis (DVT)  of right femoral vein ([place])   . Hyponatremia   . Hypokalemia   . Hypomagnesemia   . Ileus, postoperative ([place])   . Thoracic myelopathy 03/21/2018  . Neurogenic bowel 03/21/2018  . Neurogenic bladder 03/21/2018  . Left wrist fracture, sequela 03/21/2018  . Numbness 02/15/2018  . Ataxic gait 02/15/2018  . Left leg weakness 02/15/2018  . Other fatigue 02/15/2018  . Urinary incontinence 02/15/2018  . Bowel incontinence 02/15/2018    Denice Bors 01/28/2021, 1:06 PM  [facility] [address] [place], [place], [address] Phone: [phone]   Fax:  [phone]  Name: LEIGHANN AMADON MRN: [id number] Date of Birth: 09/28/1956

## 2021-02-02 ENCOUNTER — Other Ambulatory Visit: Payer: Self-pay

## 2021-02-02 ENCOUNTER — Encounter: Payer: Self-pay | Admitting: Physical Therapy

## 2021-02-02 ENCOUNTER — Ambulatory Visit: Payer: Medicare Other | Admitting: Physical Therapy

## 2021-02-02 DIAGNOSIS — R296 Repeated falls: Secondary | ICD-10-CM | POA: Diagnosis not present

## 2021-02-02 DIAGNOSIS — R208 Other disturbances of skin sensation: Secondary | ICD-10-CM

## 2021-02-02 DIAGNOSIS — R2689 Other abnormalities of gait and mobility: Secondary | ICD-10-CM

## 2021-02-02 DIAGNOSIS — R2681 Unsteadiness on feet: Secondary | ICD-10-CM | POA: Diagnosis not present

## 2021-02-02 DIAGNOSIS — M6281 Muscle weakness (generalized): Secondary | ICD-10-CM

## 2021-02-02 NOTE — Therapy (Signed)
[facility] [address] [place], [place], [address] Phone: [phone]   Fax:  [phone]  Physical Therapy Treatment  Patient Details  Name: Hannah Jefferson MRN: [id number] Date of Birth: [birth year] Referring Provider (PT): Carol Ada, MD   Encounter Date: 02/02/2021   PT End of Session - 02/02/21 1227    Visit Number 5    Number of Visits 9    Date for PT Re-Evaluation 02/21/21    Authorization Type U[employer] Medicare; $30 copay    Progress Note Due on Visit 10    PT Start Time 1100    PT Stop Time 1145    PT Time Calculation (min) 45 min    Activity Tolerance Patient tolerated treatment well    Behavior During Therapy [facility] for tasks assessed/performed           Past Medical History:  Diagnosis Date  . Chronic left hip pain   . GERD (gastroesophageal reflux disease)   . Hearing loss of both ears   . Hypertension   . Neuropathy    numbness/tingling from waist down  . Vision abnormalities     Past Surgical History:  Procedure Laterality Date  . IR IVC FILTER PLMT / S&I /IMG GUID/MOD SED  04/05/2018  . IR IVC FILTER RETRIEVAL / S&I /IMG GUID/MOD SED  05/10/2019  . IR RADIOLOGIST EVAL & MGMT  05/02/2019  . LAPAROSCOPIC CHOLECYSTECTOMY  1992  . REFRACTIVE SURGERY Bilateral 2004    There were no vitals filed for this visit.   Subjective Assessment - 02/02/21 1106    Subjective Has GI appointment later this week.  Had another bleeding episode this morning.  Pt reports still having some low back issues on L side after standing prolonged period of time (after standing at church or walking in the grocery store), hears popping and is very painful.  Feels like she is getting stronger and is doing her exercises by the sink.  Has not tried getting up off the floor.  Is getting more steps per day and is going to be starting walking outside more often now.    Patient is accompained by: Family member    Pertinent  History T7-T8 meningioma, Chronic L hip pain, hearing loss, HTN, neuropathy, vision abnormalities, IVC Filter placement for DVT of R femoral vein, L wrist fracture    How long can you stand comfortably? 15 minutes    How long can you walk comfortably? 5,000 steps a day; a mile outside with son and daughter - in - law    Diagnostic tests Goes once a year for MRI to check for more tumors; MRI was clear - has scar tissue pressing against spine    Patient Stated Goals To decrease falls risk, to improve LE strength and to increase overall standing endurance    Currently in Pain? No/denies              [facility] PT Assessment - 02/02/21 1112      Berg Balance Test   Sit to Stand Able to stand without using hands and stabilize independently    Standing Unsupported Able to stand safely 2 minutes    Sitting with Back Unsupported but Feet Supported on Floor or Stool Able to sit safely and securely 2 minutes    Stand to Sit Sits safely with minimal use of hands    Transfers Able to transfer safely, minor use of hands    Standing Unsupported with Eyes Closed Able  to stand 10 seconds with supervision    Standing Unsupported with Feet Together Able to place feet together independently and stand 1 minute safely    From Standing, Reach Forward with Outstretched Arm Can reach confidently >25 cm (10")    From Standing Position, Pick up Object from Floor Able to pick up shoe, needs supervision   due to L low back cramp   From Standing Position, Turn to Look Behind Over each Shoulder Looks behind from both sides and weight shifts well    Turn 360 Degrees Able to turn 360 degrees safely one side only in 4 seconds or less    Standing Unsupported, Alternately Place Feet on Step/Stool Able to complete 4 steps without aid or supervision    Standing Unsupported, One Foot in [place] to plae foot ahead of the other independently and hold 30 seconds    Standing on One Leg Tries to lift leg/unable to hold 3 seconds but  remains standing independently    Total Score 47    Berg comment: 47/56 moderate falls risk                         [facility] Adult PT Treatment/Exercise - 02/02/21 1226      Exercises   Exercises Other Exercises    Other Exercises  Added to HEP: seated piriformis stretch for L hip when pt experiences spasm after standing for prolonged period of time.  Also added standing balance exercises: SLS and staggered stance for more narrow BOS training.  See below for details.  Pt return demonstrated each exercise safely.           Access Code: [email or web address] URL: [email or web address] Date: 02/02/2021 Prepared by: Misty Stanley  Exercises Seated Hamstring Stretch with Strap - 1 x daily - 7 x weekly - 2 sets - 30 hold Seated Quadratus Lumborum Stretch with Arm Overhead - 1 x daily - 7 x weekly - 2 sets - 20 seconds hold Seated Trunk Rotation - 1 x daily - 7 x weekly - 2 sets - 10 seconds hold Supine Straight Leg Raises - 1 x daily - 7 x weekly - 2 sets - 10 reps Sidelying Reverse Clamshell - 1 x daily - 7 x weekly - 2 sets - 5 reps Standing Terminal Knee Extension with Resistance - 1 x daily - 7 x weekly - 2 sets - 10 reps Seated Figure 4 Piriformis Stretch - 1 x daily - 7 x weekly - 2 sets - 60 second hold Standing Single Leg Stance with Unilateral Counter Support - 1 x daily - 7 x weekly - 2 sets - 10 seconds hold Staggered stance with Eyes Open - 1 x daily - 7 x weekly - 2 sets - 30 second hold          PT Education - 02/02/21 1227    Education Details progress towards goals, updated HEP, plan for next visit    Person(s) Educated Patient;Child(ren)    Methods Explanation;Demonstration;Handout    Comprehension Verbalized understanding;Returned demonstration            PT Short Term Goals - 02/02/21 1129      PT SHORT TERM GOAL #1   Title Pt will demonstrate independence with initial HEP for strength, balance, and endurance    Time 4    Period Weeks     Status Achieved    Target Date 01/22/21      PT SHORT TERM GOAL #2  Title Pt will improve 30 second sit to stand from chair with no UE to 10 reps to indicate improved strength and endurance    Baseline 10 reps, 5TSS time was 13.19 secs without UE support, only needed single warm up sit<>stand prior to standing.    Time 4    Period Weeks    Status Achieved    Target Date 01/22/21      PT SHORT TERM GOAL #3   Title Pt will improve gait velocity with cane to 2.6 ft/sec to indicate decreased falls risk    Baseline 2.3 ft/sec with cane to 3.2 ft/sec with cane    Time 4    Period Weeks    Status Achieved    Target Date 01/22/21      PT SHORT TERM GOAL #4   Title Pt will improve BERG by 4 points to indicate decreased falls risk    Baseline 47/56    Time 4    Period Weeks    Status Achieved    Target Date 01/22/21             PT Long Term Goals - 12/23/20 1948      PT LONG TERM GOAL #1   Title Pt will demonstrate independence with final HEP and will report return to walking 1 mile a day with son and daughter in law    Time 8    Period Weeks    Status New    Target Date 02/21/21      PT LONG TERM GOAL #2   Title Pt will demonstrate ability to stand from floor using UE on furniture with supervision    Time 8    Period Weeks    Status New    Target Date 02/21/21      PT LONG TERM GOAL #3   Title Pt will improve 30 second sit to stand to 15 reps without use of UE to improve functional LE strength    Time 8    Period Weeks    Status New    Target Date 02/21/21      PT LONG TERM GOAL #4   Title Pt will improve BERG balance to >/= 48/56 to indicate decreased falls risk    Time 8    Period Weeks    Status New    Target Date 02/21/21      PT LONG TERM GOAL #5   Title Pt will improve gait velocity with cane to >/= 3.0 ft/sec    Time 8    Period Weeks    Status New    Target Date 02/21/21      PT LONG TERM GOAL #6   Title Pt will report ability to cook, do laundry,  clean, etc with minimal complaints of L back/hip cramping/spasms and will demonstrate effective stretches to address spasms    Time 8    Period Weeks    Status New                 Plan - 02/02/21 1228    Clinical Impression Statement Completed assessment of pt progress towards goals.  Pt has also met BERG goal and has decreased falls risk by 30%.  Due to progress updated HEP to include more standing balance exercises and piriformis stretch to continue to address spasm in L low back.  Will continue to address and progress towards LTG.    Personal Factors and Comorbidities Comorbidity 3+;Finances;Fitness;Past/Current Experience;Time since onset of injury/illness/exacerbation  Comorbidities T7-T8 meningioma, falls, Chronic L hip pain, hearing loss, HTN, neuropathy, vision abnormalities, IVC Filter placement for DVT of R femoral vein, L wrist fracture    Examination-Activity Limitations Bend;Locomotion Level;Stairs;Stand    Examination-Participation Restrictions Cleaning;Community Activity;Laundry;Meal Prep    Stability/Clinical Decision Making Evolving/Moderate complexity    Rehab Potential Good    PT Frequency 1x / week    PT Duration 8 weeks    PT Treatment/Interventions ADLs/Self Care Home Management;Aquatic Therapy;Cryotherapy;Electrical Stimulation;Moist Heat;DME Instruction;Gait training;Stair training;Functional mobility training;Therapeutic activities;Therapeutic exercise;Balance training;Neuromuscular re-education;Patient/family education;Passive range of motion;Energy conservation;Dry needling    PT Next Visit Plan How was GI consult?  Continue to add balance to HEP.  Continue to work on LE strengthening - especially proximal hip/glute med and knee on R, stretches for spasms on L side, standing endurance, training for getting up from the floor, balance exercises.    PT Junior, see my blank note from 3/14-I just haven't been doing anything to make her "strain" just  to be safe.  She sees MD about colonoscopy consult on 3/25    Consulted and Agree with Plan of Care Patient;Family member/caregiver    Family Member Consulted son           Patient will benefit from skilled therapeutic intervention in order to improve the following deficits and impairments:  Abnormal gait,Decreased activity tolerance,Decreased balance,Decreased strength,Difficulty walking,Increased muscle spasms,Impaired sensation,Impaired tone,Pain  Visit Diagnosis: Muscle weakness (generalized)  Repeated falls  Other disturbances of skin sensation  Other abnormalities of gait and mobility  Unsteadiness on feet     Problem List Patient Active Problem List   Diagnosis Date Noted  . Anxiety and depression 05/30/2018  . E. coli UTI 04/27/2018  . Acute blood loss anemia   . Acute deep vein thrombosis (DVT) of right femoral vein ([place])   . Hyponatremia   . Hypokalemia   . Hypomagnesemia   . Ileus, postoperative ([place])   . Thoracic myelopathy 03/21/2018  . Neurogenic bowel 03/21/2018  . Neurogenic bladder 03/21/2018  . Left wrist fracture, sequela 03/21/2018  . Numbness 02/15/2018  . Ataxic gait 02/15/2018  . Left leg weakness 02/15/2018  . Other fatigue 02/15/2018  . Urinary incontinence 02/15/2018  . Bowel incontinence 02/15/2018    Rico Junker, PT, DPT 02/02/21    12:30 PM    [facility] [address] [place], [place], [address] Phone: [phone]   Fax:  [phone]  Name: FALESHA SCHOMMER MRN: [id number] Date of Birth: 1956-03-19

## 2021-02-02 NOTE — Patient Instructions (Signed)
Access Code: [email or web address] URL: [email or web address] Date: 02/02/2021 Prepared by: Misty Stanley  Exercises Seated Hamstring Stretch with Strap - 1 x daily - 7 x weekly - 2 sets - 30 hold Seated Quadratus Lumborum Stretch with Arm Overhead - 1 x daily - 7 x weekly - 2 sets - 20 seconds hold Seated Trunk Rotation - 1 x daily - 7 x weekly - 2 sets - 10 seconds hold Supine Straight Leg Raises - 1 x daily - 7 x weekly - 2 sets - 10 reps Sidelying Reverse Clamshell - 1 x daily - 7 x weekly - 2 sets - 5 reps Standing Terminal Knee Extension with Resistance - 1 x daily - 7 x weekly - 2 sets - 10 reps Seated Figure 4 Piriformis Stretch - 1 x daily - 7 x weekly - 2 sets - 60 second hold Standing Single Leg Stance with Unilateral Counter Support - 1 x daily - 7 x weekly - 2 sets - 10 seconds hold Staggered stance with Eyes Open - 1 x daily - 7 x weekly - 2 sets - 30 second hold

## 2021-02-06 DIAGNOSIS — K625 Hemorrhage of anus and rectum: Secondary | ICD-10-CM | POA: Diagnosis not present

## 2021-02-09 ENCOUNTER — Other Ambulatory Visit: Payer: Self-pay

## 2021-02-09 ENCOUNTER — Ambulatory Visit: Payer: Medicare Other | Admitting: Physical Therapy

## 2021-02-09 ENCOUNTER — Encounter: Payer: Self-pay | Admitting: Physical Therapy

## 2021-02-09 DIAGNOSIS — M6281 Muscle weakness (generalized): Secondary | ICD-10-CM

## 2021-02-09 DIAGNOSIS — R2689 Other abnormalities of gait and mobility: Secondary | ICD-10-CM | POA: Diagnosis not present

## 2021-02-09 DIAGNOSIS — R296 Repeated falls: Secondary | ICD-10-CM

## 2021-02-09 DIAGNOSIS — R2681 Unsteadiness on feet: Secondary | ICD-10-CM

## 2021-02-09 DIAGNOSIS — R208 Other disturbances of skin sensation: Secondary | ICD-10-CM

## 2021-02-09 NOTE — Therapy (Signed)
[facility] [address] [place], [place], [address] Phone: [phone]   Fax:  [phone]  Physical Therapy Treatment  Patient Details  Name: Hannah Jefferson MRN: [id number] Date of Birth: 22-Jun-1956 Referring Geovani Tootle (PT): Carol Ada, MD   Encounter Date: 02/09/2021   PT End of Session - 02/09/21 1203    Visit Number 6    Number of Visits 9    Date for PT Re-Evaluation 02/21/21    Authorization Type UHC Medicare; $30 copay    Progress Note Due on Visit 10    PT Start Time 1103    PT Stop Time 1145    PT Time Calculation (min) 42 min    Activity Tolerance Patient tolerated treatment well    Behavior During Therapy [facility] for tasks assessed/performed           Past Medical History:  Diagnosis Date  . Chronic left hip pain   . GERD (gastroesophageal reflux disease)   . Hearing loss of both ears   . Hypertension   . Neuropathy    numbness/tingling from waist down  . Vision abnormalities     Past Surgical History:  Procedure Laterality Date  . IR IVC FILTER PLMT / S&I /IMG GUID/MOD SED  04/05/2018  . IR IVC FILTER RETRIEVAL / S&I /IMG GUID/MOD SED  05/10/2019  . IR RADIOLOGIST EVAL & MGMT  05/02/2019  . LAPAROSCOPIC CHOLECYSTECTOMY  1992  . REFRACTIVE SURGERY Bilateral 2004    There were no vitals filed for this visit.   Subjective Assessment - 02/09/21 1106    Subjective Pt reports she is scheduled for a hemorrhoid surgery.  She is unsure of precautions following surgery.  She is also scheduled for a colonoscopy following.  She reports no bleeding this morning.  Her L hip is currnetly feeling good.  She had an episode of pain yesterday after church but walking seems to help.    Patient is accompained by: Family member    Pertinent History T7-T8 meningioma, Chronic L hip pain, hearing loss, HTN, neuropathy, vision abnormalities, IVC Filter placement for DVT of R femoral vein, L wrist fracture    How  long can you stand comfortably? 15 minutes    How long can you walk comfortably? 5,000 steps a day; a mile outside with son and daughter - in - law    Diagnostic tests Goes once a year for MRI to check for more tumors; MRI was clear - has scar tissue pressing against spine    Patient Stated Goals To decrease falls risk, to improve LE strength and to increase overall standing endurance                             [facility] Adult PT Treatment/Exercise - 02/09/21 1124      Ambulation/Gait   Ambulation/Gait Yes    Ambulation/Gait Assistance 5: Supervision    Ambulation/Gait Assistance Details Excessive bilateral hip ER, decreased stance time L    Ambulation Distance (Feet) 230 Feet    Assistive device Straight cane      Exercises   Exercises Knee/Hip    Other Exercises  Tall kneeling with bench for UE support: 2x10 hip hinge decreased weight shift to L, tactile cueing to maintain neutral hip alignment, 2x10each hip walk forward/backward, increased use of UE support noted with increased repititions, verbal cueing to maintain proper trunk alignment.  Side Sit to Quadraped x5 each side.  Increased difficulty L side side, able to complete without assistance, decreased bilateral eccentric control, compensatory rocking stategy used               Balance Exercises - 02/09/21 1155      Balance Exercises: Standing   SLS Eyes open;Solid surface;5 reps;Limitations   5 seconds   SLS Limitations Excessive hip ER and trunk rotation noted when foot required to maintain neutral alignment on L    Sidestepping Upper extremity support;4 reps;Limitations    Sidestepping Limitations Along countertop wiht intermittent UE support, excessive hip ER noted, verbal cueing unable to correct compensation    Step Over Hurdles / Cones x5each lateral step over 4in hurdle, decreased step length when stepping R.  x5 forward/backward with forward weightshift only, more difficulty noted when stepping with  RLE, intermitted UE support.  Verbal cueing noted to maintain neutral hip alignment    Other Standing Exercises Closed chain hip IR during SLS. 2x5 on L.  Verbal and tactile cueing to establish proper hip movement.             PT Education - 02/09/21 1202    Education Details Updated HEP, body mechanics and excessive hip ER, benefit of glute strengthening and stretching to address toeing out when walking    Person(s) Educated Patient    Methods Explanation;Handout    Comprehension Verbalized understanding            PT Short Term Goals - 02/02/21 1129      PT SHORT TERM GOAL #1   Title Pt will demonstrate independence with initial HEP for strength, balance, and endurance    Time 4    Period Weeks    Status Achieved    Target Date 01/22/21      PT SHORT TERM GOAL #2   Title Pt will improve 30 second sit to stand from chair with no UE to 10 reps to indicate improved strength and endurance    Baseline 10 reps, 5TSS time was 13.19 secs without UE support, only needed single warm up sit<>stand prior to standing.    Time 4    Period Weeks    Status Achieved    Target Date 01/22/21      PT SHORT TERM GOAL #3   Title Pt will improve gait velocity with cane to 2.6 ft/sec to indicate decreased falls risk    Baseline 2.3 ft/sec with cane to 3.2 ft/sec with cane    Time 4    Period Weeks    Status Achieved    Target Date 01/22/21      PT SHORT TERM GOAL #4   Title Pt will improve BERG by 4 points to indicate decreased falls risk    Baseline 47/56    Time 4    Period Weeks    Status Achieved    Target Date 01/22/21             PT Long Term Goals - 12/23/20 1948      PT LONG TERM GOAL #1   Title Pt will demonstrate independence with final HEP and will report return to walking 1 mile a day with son and daughter in law    Time 8    Period Weeks    Status New    Target Date 02/21/21      PT LONG TERM GOAL #2   Title Pt will demonstrate ability to stand from floor  using UE on furniture with supervision    Time  8    Period Weeks    Status New    Target Date 02/21/21      PT LONG TERM GOAL #3   Title Pt will improve 30 second sit to stand to 15 reps without use of UE to improve functional LE strength    Time 8    Period Weeks    Status New    Target Date 02/21/21      PT LONG TERM GOAL #4   Title Pt will improve BERG balance to >/= 48/56 to indicate decreased falls risk    Time 8    Period Weeks    Status New    Target Date 02/21/21      PT LONG TERM GOAL #5   Title Pt will improve gait velocity with cane to >/= 3.0 ft/sec    Time 8    Period Weeks    Status New    Target Date 02/21/21      PT LONG TERM GOAL #6   Title Pt will report ability to cook, do laundry, clean, etc with minimal complaints of L back/hip cramping/spasms and will demonstrate effective stretches to address spasms    Time 8    Period Weeks    Status New                 Plan - 02/09/21 1203    Clinical Impression Statement Addressed floor to standing transfers with use of sidesitting to quadraped.  Pt able to complete without assistance and compensatory strategy of rocking.  Excessive hip ER noted during walking and sidestepping.  Verbal cueing unable to correct excessive toeing out.  Hip ER and trunk rotation noted when foot forced to maintain nutral alignment.  Included a closed shain hip IR exercise to address compensations that pt tolerated well.    Personal Factors and Comorbidities Comorbidity 3+;Finances;Fitness;Past/Current Experience;Time since onset of injury/illness/exacerbation    Comorbidities T7-T8 meningioma, falls, Chronic L hip pain, hearing loss, HTN, neuropathy, vision abnormalities, IVC Filter placement for DVT of R femoral vein, L wrist fracture    Examination-Activity Limitations Bend;Locomotion Level;Stairs;Stand    Examination-Participation Restrictions Cleaning;Community Activity;Laundry;Meal Prep    Stability/Clinical Decision Making  Evolving/Moderate complexity    Rehab Potential Good    PT Frequency 1x / week    PT Duration 8 weeks    PT Treatment/Interventions ADLs/Self Care Home Management;Aquatic Therapy;Cryotherapy;Electrical Stimulation;Moist Heat;DME Instruction;Gait training;Stair training;Functional mobility training;Therapeutic activities;Therapeutic exercise;Balance training;Neuromuscular re-education;Patient/family education;Passive range of motion;Energy conservation;Dry needling    PT Next Visit Plan Last session before hemorrhoid surgery; will need resume order to return to therapy - will perform re-assessment and possible recert when she returns. Continue to work on LE strengthening - especially proximal hip/glute med and knee on R, stretches for spasms on L side, standing endurance, training for getting up from the floor, balance exercises.    PT Home Exercise Plan --    Consulted and Agree with Plan of Care Patient;Family member/caregiver    Family Member Consulted son           Patient will benefit from skilled therapeutic intervention in order to improve the following deficits and impairments:  Abnormal gait,Decreased activity tolerance,Decreased balance,Decreased strength,Difficulty walking,Increased muscle spasms,Impaired sensation,Impaired tone,Pain  Visit Diagnosis: Muscle weakness (generalized)  Repeated falls  Other disturbances of skin sensation  Other abnormalities of gait and mobility  Unsteadiness on feet     Problem List Patient Active Problem List   Diagnosis Date Noted  . Anxiety and depression  05/30/2018  . E. coli UTI 04/27/2018  . Acute blood loss anemia   . Acute deep vein thrombosis (DVT) of right femoral vein ([place])   . Hyponatremia   . Hypokalemia   . Hypomagnesemia   . Ileus, postoperative ([place])   . Thoracic myelopathy 03/21/2018  . Neurogenic bowel 03/21/2018  . Neurogenic bladder 03/21/2018  . Left wrist fracture, sequela 03/21/2018  . Numbness 02/15/2018  .  Ataxic gait 02/15/2018  . Left leg weakness 02/15/2018  . Other fatigue 02/15/2018  . Urinary incontinence 02/15/2018  . Bowel incontinence 02/15/2018    Yetta Numbers, SPT 02/09/2021, 12:11 PM  [facility] [address] [place], [place], [address] Phone: [phone]   Fax:  [phone]  Name: Hannah Jefferson MRN: [id number] Date of Birth: [birth year]

## 2021-02-09 NOTE — Patient Instructions (Signed)
Access Code: [email or web address] URL: [email or web address] Date: 02/09/2021 Prepared by: Misty Stanley  Exercises Seated Hamstring Stretch with Strap - 1 x daily - 7 x weekly - 2 sets - 30 hold Seated Quadratus Lumborum Stretch with Arm Overhead - 1 x daily - 7 x weekly - 2 sets - 20 seconds hold Seated Trunk Rotation - 1 x daily - 7 x weekly - 2 sets - 10 seconds hold Supine Straight Leg Raises - 1 x daily - 7 x weekly - 2 sets - 10 reps Standing Terminal Knee Extension with Resistance - 1 x daily - 7 x weekly - 2 sets - 10 reps Seated Figure 4 Piriformis Stretch - 1 x daily - 7 x weekly - 2 sets - 60 second hold Standing Single Leg Stance with Unilateral Counter Support - 1 x daily - 7 x weekly - 2 sets - 10 seconds hold Staggered stance with Eyes Open - 1 x daily - 7 x weekly - 2 sets - 30 second hold Standing Hip Internal Rotation Stretch - 1 x daily - 7 x weekly - 2 sets - 10 reps

## 2021-02-16 ENCOUNTER — Encounter: Payer: Self-pay | Admitting: Rehabilitation

## 2021-02-16 ENCOUNTER — Other Ambulatory Visit: Payer: Self-pay

## 2021-02-16 ENCOUNTER — Ambulatory Visit: Payer: Medicare Other | Attending: Family Medicine | Admitting: Rehabilitation

## 2021-02-16 DIAGNOSIS — R296 Repeated falls: Secondary | ICD-10-CM

## 2021-02-16 DIAGNOSIS — R208 Other disturbances of skin sensation: Secondary | ICD-10-CM | POA: Insufficient documentation

## 2021-02-16 DIAGNOSIS — R2689 Other abnormalities of gait and mobility: Secondary | ICD-10-CM | POA: Diagnosis not present

## 2021-02-16 DIAGNOSIS — M6281 Muscle weakness (generalized): Secondary | ICD-10-CM | POA: Insufficient documentation

## 2021-02-16 DIAGNOSIS — R2681 Unsteadiness on feet: Secondary | ICD-10-CM | POA: Diagnosis not present

## 2021-02-16 NOTE — Therapy (Signed)
[facility] [address] [place], [place], [address] Phone: [phone]   Fax:  [phone]  Physical Therapy Treatment  Patient Details  Name: Hannah Jefferson MRN: [id number] Date of Birth: 20-Jul-1956 Referring Provider (PT): Carol Ada, MD   Encounter Date: 02/16/2021   PT End of Session - 02/16/21 1110    Visit Number 7    Number of Visits 9    Date for PT Re-Evaluation 02/21/21    Authorization Type [employer] Medicare; $30 copay    Progress Note Due on Visit 10    PT Start Time 1103    PT Stop Time 1145    PT Time Calculation (min) 42 min    Activity Tolerance Patient tolerated treatment well    Behavior During Therapy [facility] for tasks assessed/performed           Past Medical History:  Diagnosis Date  . Chronic left hip pain   . GERD (gastroesophageal reflux disease)   . Hearing loss of both ears   . Hypertension   . Neuropathy    numbness/tingling from waist down  . Vision abnormalities     Past Surgical History:  Procedure Laterality Date  . IR IVC FILTER PLMT / S&I /IMG GUID/MOD SED  04/05/2018  . IR IVC FILTER RETRIEVAL / S&I /IMG GUID/MOD SED  05/10/2019  . IR RADIOLOGIST EVAL & MGMT  05/02/2019  . LAPAROSCOPIC CHOLECYSTECTOMY  1992  . REFRACTIVE SURGERY Bilateral 2004    There were no vitals filed for this visit.   Subjective Assessment - 02/16/21 1108    Subjective Reports doing well, hip pain is good today, but has been flaring up more.    Pertinent History T7-T8 meningioma, Chronic L hip pain, hearing loss, HTN, neuropathy, vision abnormalities, IVC Filter placement for DVT of R femoral vein, L wrist fracture    Patient Stated Goals To decrease falls risk, to improve LE strength and to increase overall standing endurance    Currently in Pain? No/denies                             [facility] Adult PT Treatment/Exercise - 02/16/21 1110      Transfers   Transfers Sit to  Stand;Stand to Sit    Five time sit to stand comments  Pt able to complete 10 STS without UE support in 30 secs.  Note that she does work to gain control for improved descent during testing.      Ambulation/Gait   Ambulation/Gait Yes    Ambulation/Gait Assistance 6: Modified independent (Device/Increase time);5: Supervision    Ambulation/Gait Assistance Details Pt ambulated around therapy gym and performed gait speed assessment without use of cane.  No overt LOB, min cues for posture and improved stance control.    Ambulation Distance (Feet) 230 Feet    Assistive device None    Gait Pattern Step-through pattern;Decreased stride length;Trendelenburg    Ambulation Surface Level;Indoor    Gait velocity 3.53 ft/sec without device      Therapeutic Activites    Therapeutic Activities Other Therapeutic Activities    Other Therapeutic Activities Pt able to get into floor (from mat table) and back to sitting on mat table today at S level with min cues for controlled movement and foot position.      Neuro Re-ed    Neuro Re-ed Details  At counter top for single UE support:  forward marching with emphasis on  slow movement for SLS and cues for core activation and proximal hip activation on stance leg x 4 laps, Standing on foam airex alternating forward step and back to airex x 10 reps with single UE support and cues for posture and improved stability in stance leg when retrostepping back to mat>progressing to lateral steps off airex and back x 10 reps with light BUE support.      Exercises   Exercises Other Exercises    Other Exercises  Performed standing hip abd/add stretch with hands propped on table and feet shoulder width apart, allowing single LE to flex, keeping opposite in extension and "pushing back and laterally" slightly to get stretch x 30 secs on each side.  Standing L piriformis stretch x 3 reps of 15 secs.  Educated that she could do these at home if needing to stretch while in kitchen or  performing standing tasks.   Squats with slight posterior pelvic tilt for core activation x 10 reps with cues for exhale going into squat, inhale back to stand.  Side stepping squats x 20' with light UE support from PT with cues to maintain slight knee flexion.  Closed chain hip abd on bottom step with BUE support x 10 reps on each side with cues and demo on technique.                  PT Education - 02/16/21 1308    Education Details verbally educated and performed standing hip stretch for when having increased hip pain in standing.    Person(s) Educated Patient    Methods Explanation;Demonstration    Comprehension Verbalized understanding;Returned demonstration            PT Short Term Goals - 02/02/21 1129      PT SHORT TERM GOAL #1   Title Pt will demonstrate independence with initial HEP for strength, balance, and endurance    Time 4    Period Weeks    Status Achieved    Target Date 01/22/21      PT SHORT TERM GOAL #2   Title Pt will improve 30 second sit to stand from chair with no UE to 10 reps to indicate improved strength and endurance    Baseline 10 reps, 5TSS time was 13.19 secs without UE support, only needed single warm up sit<>stand prior to standing.    Time 4    Period Weeks    Status Achieved    Target Date 01/22/21      PT SHORT TERM GOAL #3   Title Pt will improve gait velocity with cane to 2.6 ft/sec to indicate decreased falls risk    Baseline 2.3 ft/sec with cane to 3.2 ft/sec with cane    Time 4    Period Weeks    Status Achieved    Target Date 01/22/21      PT SHORT TERM GOAL #4   Title Pt will improve BERG by 4 points to indicate decreased falls risk    Baseline 47/56    Time 4    Period Weeks    Status Achieved    Target Date 01/22/21             PT Long Term Goals - 02/16/21 1111      PT LONG TERM GOAL #1   Title Pt will demonstrate independence with final HEP and will report return to walking 1 mile a day with son and daughter  in law    Baseline is going to  attempt a mile today 02/16/21    Time 8    Period Weeks    Status Not Met      PT LONG TERM GOAL #2   Title Pt will demonstrate ability to stand from floor using UE on furniture with supervision    Baseline Pt was able to do 02/16/21 at S level    Time 8    Period Weeks    Status Achieved      PT LONG TERM GOAL #3   Title Pt will improve 30 second sit to stand to 15 reps without use of UE to improve functional LE strength    Baseline 10 reps without UE support in 30 secs 02/16/21    Time 8    Period Weeks    Status Partially Met      PT LONG TERM GOAL #4   Title Pt will improve BERG balance to >/= 48/56 to indicate decreased falls risk    Time 8    Period Weeks    Status New      PT LONG TERM GOAL #5   Title Pt will improve gait velocity with cane to >/= 3.0 ft/sec    Baseline 3.53 ft/sec 02/16/21 without cane    Time 8    Period Weeks    Status Achieved      PT LONG TERM GOAL #6   Title Pt will report ability to cook, do laundry, clean, etc with minimal complaints of L back/hip cramping/spasms and will demonstrate effective stretches to address spasms   reports she stretches, but still has pain   Time 8    Period Weeks    Status Not Met                 Plan - 02/16/21 1309    Clinical Impression Statement Skilled session addressed LTGs briefly (did not do BERG as she had completed this not too long ago in previous session).  Pt is meeting some goals and will need them updated however still has high level strength and balance deficits.  Will place pt on hold at this time with plan for her to return following surgery and colonoscopy.  Pt to call when ready to schedule.  Will schedule for 1x/wk for 4 more weeks.    Personal Factors and Comorbidities Comorbidity 3+;Finances;Fitness;Past/Current Experience;Time since onset of injury/illness/exacerbation    Comorbidities T7-T8 meningioma, falls, Chronic L hip pain, hearing loss, HTN, neuropathy,  vision abnormalities, IVC Filter placement for DVT of R femoral vein, L wrist fracture    Examination-Activity Limitations Bend;Locomotion Level;Stairs;Stand    Examination-Participation Restrictions Cleaning;Community Activity;Laundry;Meal Prep    Stability/Clinical Decision Making Evolving/Moderate complexity    Rehab Potential Good    PT Frequency 1x / week    PT Duration 8 weeks    PT Treatment/Interventions ADLs/Self Care Home Management;Aquatic Therapy;Cryotherapy;Electrical Stimulation;Moist Heat;DME Instruction;Gait training;Stair training;Functional mobility training;Therapeutic activities;Therapeutic exercise;Balance training;Neuromuscular re-education;Patient/family education;Passive range of motion;Energy conservation;Dry needling    PT Next Visit Plan Reassess goals and update.  Re-cert for 1x/wk for 4 weeks. Continue to work on LE strengthening - especially proximal hip/glute med and knee on R, stretches for spasms on L side, standing endurance, training for getting up from the floor, balance exercises.    Consulted and Agree with Plan of Care Patient;Family member/caregiver    Family Member Consulted son           Patient will benefit from skilled therapeutic intervention in order to improve the following deficits  and impairments:  Abnormal gait,Decreased activity tolerance,Decreased balance,Decreased strength,Difficulty walking,Increased muscle spasms,Impaired sensation,Impaired tone,Pain  Visit Diagnosis: Muscle weakness (generalized)  Repeated falls  Other disturbances of skin sensation  Other abnormalities of gait and mobility  Unsteadiness on feet     Problem List Patient Active Problem List   Diagnosis Date Noted  . Anxiety and depression 05/30/2018  . E. coli UTI 04/27/2018  . Acute blood loss anemia   . Acute deep vein thrombosis (DVT) of right femoral vein ([place])   . Hyponatremia   . Hypokalemia   . Hypomagnesemia   . Ileus, postoperative ([place])   .  Thoracic myelopathy 03/21/2018  . Neurogenic bowel 03/21/2018  . Neurogenic bladder 03/21/2018  . Left wrist fracture, sequela 03/21/2018  . Numbness 02/15/2018  . Ataxic gait 02/15/2018  . Left leg weakness 02/15/2018  . Other fatigue 02/15/2018  . Urinary incontinence 02/15/2018  . Bowel incontinence 02/15/2018    Cameron Sprang, PT, MPT [facility] [address] [place], [place], [address] Phone: [phone]   Fax:  [phone] 02/16/21, 1:12 PM  Name: Hannah Jefferson MRN: [id number] Date of Birth: May 11, 1956

## 2021-02-19 DIAGNOSIS — K648 Other hemorrhoids: Secondary | ICD-10-CM | POA: Diagnosis not present

## 2021-02-23 ENCOUNTER — Ambulatory Visit: Payer: Medicare Other | Admitting: Physical Therapy

## 2021-03-18 DIAGNOSIS — D125 Benign neoplasm of sigmoid colon: Secondary | ICD-10-CM | POA: Diagnosis not present

## 2021-03-18 DIAGNOSIS — K621 Rectal polyp: Secondary | ICD-10-CM | POA: Diagnosis not present

## 2021-03-18 DIAGNOSIS — K625 Hemorrhage of anus and rectum: Secondary | ICD-10-CM | POA: Diagnosis not present

## 2021-03-18 DIAGNOSIS — R195 Other fecal abnormalities: Secondary | ICD-10-CM | POA: Diagnosis not present

## 2021-03-20 DIAGNOSIS — D125 Benign neoplasm of sigmoid colon: Secondary | ICD-10-CM | POA: Diagnosis not present

## 2021-06-08 ENCOUNTER — Other Ambulatory Visit: Payer: Self-pay | Admitting: Family Medicine

## 2021-06-08 ENCOUNTER — Other Ambulatory Visit (HOSPITAL_COMMUNITY)
Admission: RE | Admit: 2021-06-08 | Discharge: 2021-06-08 | Disposition: A | Discharge status: Home / Self Care | Payer: Medicare Other | Source: Ambulatory Visit | Attending: Family Medicine | Admitting: Family Medicine

## 2021-06-08 DIAGNOSIS — I10 Essential (primary) hypertension: Secondary | ICD-10-CM | POA: Diagnosis not present

## 2021-06-08 DIAGNOSIS — Z01419 Encounter for gynecological examination (general) (routine) without abnormal findings: Secondary | ICD-10-CM | POA: Insufficient documentation

## 2021-06-08 DIAGNOSIS — K219 Gastro-esophageal reflux disease without esophagitis: Secondary | ICD-10-CM | POA: Diagnosis not present

## 2021-06-08 DIAGNOSIS — Z1151 Encounter for screening for human papillomavirus (HPV): Secondary | ICD-10-CM | POA: Insufficient documentation

## 2021-06-08 DIAGNOSIS — Z124 Encounter for screening for malignant neoplasm of cervix: Secondary | ICD-10-CM | POA: Diagnosis present

## 2021-06-08 DIAGNOSIS — Z1389 Encounter for screening for other disorder: Secondary | ICD-10-CM | POA: Diagnosis not present

## 2021-06-08 DIAGNOSIS — Z1159 Encounter for screening for other viral diseases: Secondary | ICD-10-CM | POA: Diagnosis not present

## 2021-06-08 DIAGNOSIS — E782 Mixed hyperlipidemia: Secondary | ICD-10-CM | POA: Diagnosis not present

## 2021-06-08 DIAGNOSIS — Z0000 Encounter for general adult medical examination without abnormal findings: Secondary | ICD-10-CM | POA: Diagnosis not present

## 2021-06-10 LAB — CYTOLOGY - PAP
Adequacy: ABSENT
Diagnosis: NEGATIVE

## 2021-07-17 ENCOUNTER — Other Ambulatory Visit: Payer: Self-pay | Admitting: Family Medicine

## 2021-07-17 DIAGNOSIS — Z1231 Encounter for screening mammogram for malignant neoplasm of breast: Secondary | ICD-10-CM

## 2021-07-21 ENCOUNTER — Other Ambulatory Visit: Payer: Self-pay

## 2021-07-21 ENCOUNTER — Ambulatory Visit
Admission: RE | Admit: 2021-07-21 | Discharge: 2021-07-21 | Disposition: A | Discharge status: Home / Self Care | Payer: Medicare Other | Source: Ambulatory Visit | Attending: Family Medicine | Admitting: Family Medicine

## 2021-07-21 DIAGNOSIS — Z1231 Encounter for screening mammogram for malignant neoplasm of breast: Secondary | ICD-10-CM

## 2021-09-03 DIAGNOSIS — D321 Benign neoplasm of spinal meninges: Secondary | ICD-10-CM | POA: Diagnosis not present

## 2021-09-03 DIAGNOSIS — R200 Anesthesia of skin: Secondary | ICD-10-CM | POA: Diagnosis not present

## 2021-09-03 DIAGNOSIS — G9589 Other specified diseases of spinal cord: Secondary | ICD-10-CM | POA: Diagnosis not present

## 2021-09-03 DIAGNOSIS — M4804 Spinal stenosis, thoracic region: Secondary | ICD-10-CM | POA: Diagnosis not present

## 2021-09-03 DIAGNOSIS — Z86018 Personal history of other benign neoplasm: Secondary | ICD-10-CM | POA: Diagnosis not present

## 2021-09-03 DIAGNOSIS — R29898 Other symptoms and signs involving the musculoskeletal system: Secondary | ICD-10-CM | POA: Diagnosis not present

## 2021-09-03 DIAGNOSIS — Z9889 Other specified postprocedural states: Secondary | ICD-10-CM | POA: Diagnosis not present

## 2021-09-03 DIAGNOSIS — R262 Difficulty in walking, not elsewhere classified: Secondary | ICD-10-CM | POA: Diagnosis not present

## 2021-09-03 DIAGNOSIS — Z48811 Encounter for surgical aftercare following surgery on the nervous system: Secondary | ICD-10-CM | POA: Diagnosis not present

## 2021-09-03 DIAGNOSIS — K449 Diaphragmatic hernia without obstruction or gangrene: Secondary | ICD-10-CM | POA: Diagnosis not present

## 2021-12-09 DIAGNOSIS — I10 Essential (primary) hypertension: Secondary | ICD-10-CM | POA: Diagnosis not present

## 2021-12-09 DIAGNOSIS — E782 Mixed hyperlipidemia: Secondary | ICD-10-CM | POA: Diagnosis not present

## 2021-12-29 DIAGNOSIS — H2513 Age-related nuclear cataract, bilateral: Secondary | ICD-10-CM | POA: Diagnosis not present

## 2022-06-14 DIAGNOSIS — E782 Mixed hyperlipidemia: Secondary | ICD-10-CM | POA: Diagnosis not present

## 2022-06-14 DIAGNOSIS — I10 Essential (primary) hypertension: Secondary | ICD-10-CM | POA: Diagnosis not present

## 2022-06-14 DIAGNOSIS — R739 Hyperglycemia, unspecified: Secondary | ICD-10-CM | POA: Diagnosis not present

## 2022-06-14 DIAGNOSIS — Z0000 Encounter for general adult medical examination without abnormal findings: Secondary | ICD-10-CM | POA: Diagnosis not present

## 2022-06-14 DIAGNOSIS — K219 Gastro-esophageal reflux disease without esophagitis: Secondary | ICD-10-CM | POA: Diagnosis not present

## 2022-09-02 ENCOUNTER — Other Ambulatory Visit: Payer: Self-pay | Admitting: Family Medicine

## 2022-09-02 DIAGNOSIS — Z1231 Encounter for screening mammogram for malignant neoplasm of breast: Secondary | ICD-10-CM

## 2022-09-25 ENCOUNTER — Ambulatory Visit
Admission: EM | Admit: 2022-09-25 | Discharge: 2022-09-25 | Disposition: A | Discharge status: Home / Self Care | Payer: Medicare Other | Attending: Physician Assistant | Admitting: Physician Assistant

## 2022-09-25 ENCOUNTER — Ambulatory Visit (INDEPENDENT_AMBULATORY_CARE_PROVIDER_SITE_OTHER): Payer: Medicare Other

## 2022-09-25 ENCOUNTER — Encounter: Payer: Self-pay | Admitting: Emergency Medicine

## 2022-09-25 DIAGNOSIS — R059 Cough, unspecified: Secondary | ICD-10-CM

## 2022-09-25 DIAGNOSIS — J209 Acute bronchitis, unspecified: Secondary | ICD-10-CM

## 2022-09-25 MED ORDER — PREDNISONE 50 MG PO TABS: ORAL_TABLET | ORAL | 0 refills | Status: DC

## 2022-09-25 MED ORDER — ALBUTEROL SULFATE HFA 108 (90 BASE) MCG/ACT IN AERS: 2.0000 | INHALATION_SPRAY | Freq: Four times a day (QID) | RESPIRATORY_TRACT | 0 refills | Status: AC | PRN

## 2022-09-25 MED ORDER — DOXYCYCLINE HYCLATE 100 MG PO CAPS: 100.0000 mg | ORAL_CAPSULE | Freq: Two times a day (BID) | ORAL | 0 refills | Status: AC

## 2022-09-25 NOTE — ED Triage Notes (Signed)
Pt said x 3 weeks has had a nagging cough that want go away. Losing her voice from the cough, no fevers, no chills now at first she did 3 weeks ago.

## 2022-10-01 NOTE — ED Provider Notes (Signed)
[facility]    CSN: [id number] Arrival date & time: 09/25/22  0820      History   Chief Complaint Chief Complaint  Patient presents with   Cough    HPI Hannah Jefferson is a 66 y.o. female.   Patient complains of a cough for the past 3 weeks.  Patient reports that she is coughing up phlegm.  Patient denies any exposure to viral illness.   Cough Cough characteristics:  Productive Sputum characteristics:  Nondescript Onset quality:  Gradual Duration:  3 weeks Timing:  Constant Progression:  Worsening Chronicity:  New Context: not occupational exposure and not smoke exposure   Relieved by:  Nothing Associated symptoms: no chest pain   Risk factors: no chemical exposure     Past Medical History:  Diagnosis Date   Chronic left hip pain    GERD (gastroesophageal reflux disease)    Hearing loss of both ears    Hypertension    Neuropathy    numbness/tingling from waist down   Vision abnormalities     Patient Active Problem List   Diagnosis Date Noted   Anxiety and depression 05/30/2018   E. coli UTI 04/27/2018   Acute blood loss anemia    Acute deep vein thrombosis (DVT) of right femoral vein ([facility])    Hyponatremia    Hypokalemia    Hypomagnesemia    Ileus, postoperative ([place])    Thoracic myelopathy 03/21/2018   Neurogenic bowel 03/21/2018   Neurogenic bladder 03/21/2018   Left wrist fracture, sequela 03/21/2018   Numbness 02/15/2018   Ataxic gait 02/15/2018   Left leg weakness 02/15/2018   Other fatigue 02/15/2018   Urinary incontinence 02/15/2018   Bowel incontinence 02/15/2018    Past Surgical History:  Procedure Laterality Date   IR IVC FILTER PLMT / S&I /IMG GUID/MOD SED  04/05/2018   IR IVC FILTER RETRIEVAL / S&I /IMG GUID/MOD SED  05/10/2019   IR RADIOLOGIST EVAL & MGMT  05/02/2019   LAPAROSCOPIC CHOLECYSTECTOMY  1992   REFRACTIVE SURGERY Bilateral 2004    OB History   No obstetric history on file.      Home Medications     Prior to Admission medications   Medication Sig Start Date End Date Taking? Authorizing Provider  albuterol (VENTOLIN HFA) 108 (90 Base) MCG/ACT inhaler Inhale 2 puffs into the lungs every 6 (six) hours as needed for wheezing or shortness of breath. 09/25/22  Yes Caryl Ada K, PA-C  doxycycline (VIBRAMYCIN) 100 MG capsule Take 1 capsule (100 mg total) by mouth 2 (two) times daily. 09/25/22  Yes Caryl Ada K, PA-C  predniSONE (DELTASONE) 50 MG tablet One tablet a day 09/25/22  Yes Fransico Meadow, PA-C  acetaminophen (TYLENOL) 500 MG tablet Take 500 mg by mouth every 6 (six) hours as needed.    [provider]  calcium carbonate (TUMS - DOSED IN MG ELEMENTAL CALCIUM) 500 MG chewable tablet Chew 1 tablet by mouth 3 (three) times daily as needed for indigestion or heartburn.    [provider]  dabigatran (PRADAXA) 150 MG CAPS capsule Take 150 mg by mouth 2 (two) times daily. Patient not taking: Reported on 12/23/2020    [provider]  docusate sodium (COLACE) 100 MG capsule Take 100 mg by mouth daily as needed for mild constipation. Patient not taking: Reported on 12/23/2020    [provider]  escitalopram (LEXAPRO) 10 MG tablet Take 10 mg by mouth daily.    [provider]  lisinopril (PRINIVIL,ZESTRIL) 10 MG tablet Take 10 mg by mouth daily.    [provider]  methocarbamol (ROBAXIN) 500 MG tablet Patient takes as needed 03/08/18   [provider]  omeprazole (PRILOSEC) 40 MG capsule omeprazole 40 mg capsule,delayed release    [provider]  polycarbophil (FIBERCON) 625 MG tablet Take 1 tablet (625 mg total) by mouth daily. 04/27/18   Love, Ivan Anchors, PA-C  Probiotic Product (ALIGN PO) Take by mouth.    [provider]  ranitidine (ZANTAC) 150 MG tablet Take 150 mg by mouth 2 (two) times daily. Patient not taking: Reported on 12/23/2020    [provider]    Family History Family History  Problem  Relation Age of Onset   Dementia Mother    COPD Mother    Heart disease Father    Thyroid nodules Sister    Diabetes Mellitus II Sister    Heart disease Brother    Diabetes Mellitus II Brother    Diabetes Mellitus II Brother    Heart disease Brother    Breast cancer Neg Hx     Social History Social History   Tobacco Use   Smoking status: Never   Smokeless tobacco: Never  Substance Use Topics   Alcohol use: Never   Drug use: Never     Allergies   Enoxaparin and Heparin   Review of Systems Review of Systems  Respiratory:  Positive for cough.   Cardiovascular:  Negative for chest pain.     Physical Exam Triage Vital Signs ED Triage Vitals  Enc Vitals Group     BP 09/25/22 0850 132/82     Pulse Rate 09/25/22 0850 70     Resp 09/25/22 0850 16     Temp 09/25/22 0850 98.6 F (37 C)     Temp Source 09/25/22 0850 Oral     SpO2 09/25/22 0850 99 %     Weight --      Height --      Head Circumference --      Peak Flow --      Pain Score 09/25/22 0849 0     Pain Loc --      Pain Edu? --      Excl. in [place]? --    No data found.  Updated Vital Signs BP 132/82 (BP Location: Left Arm)   Pulse 70   Temp 98.6 F (37 C) (Oral)   Resp 16   SpO2 99%   Visual Acuity Right Eye Distance:   Left Eye Distance:   Bilateral Distance:    Right Eye Near:   Left Eye Near:    Bilateral Near:     Physical Exam Vitals and nursing note reviewed.  Constitutional:      Appearance: She is well-developed.  HENT:     Head: Normocephalic.  Cardiovascular:     Rate and Rhythm: Normal rate.  Pulmonary:     Effort: Pulmonary effort is normal.  Abdominal:     General: There is no distension.  Musculoskeletal:        General: Normal range of motion.     Cervical back: Normal range of motion.  Skin:    General: Skin is warm.  Neurological:     General: No focal deficit present.     Mental Status: She is alert and oriented to person, place, and time.  Psychiatric:         Mood and Affect: Mood normal.  UC Treatments / Results  Labs (all labs ordered are listed, but only abnormal results are displayed) Labs Reviewed - No data to display  EKG   Radiology No results found.  Procedures Procedures (including critical care time)  Medications Ordered in UC Medications - No data to display  Initial Impression / Assessment and Plan / UC Course  I have reviewed the triage vital signs and the nursing notes.  Pertinent labs & imaging results that were available during my care of the patient were reviewed by me and considered in my medical decision making (see chart for details).    DM patient's chest x-ray is normal patient is given a prescription for prednisone albuterol and doxycycline.  Patient is advised to follow-up with her primary care physician for recheck  Final Clinical Impressions(s) / UC Diagnoses   Final diagnoses:  Acute bronchitis, unspecified organism   Discharge Instructions   None    ED Prescriptions     Medication Sig Dispense Auth. Provider   predniSONE (DELTASONE) 50 MG tablet One tablet a day 6 tablet Sanjeev Main K, PA-C   albuterol (VENTOLIN HFA) 108 (90 Base) MCG/ACT inhaler Inhale 2 puffs into the lungs every 6 (six) hours as needed for wheezing or shortness of breath. 8 g Krislyn Donnan K, [place]   doxycycline (VIBRAMYCIN) 100 MG capsule Take 1 capsule (100 mg total) by mouth 2 (two) times daily. 20 capsule Fransico Meadow, [place]      PDMP not reviewed this encounter.   Fransico Meadow, [place] 10/01/22 1256

## 2022-10-22 ENCOUNTER — Ambulatory Visit: Payer: Medicare Other

## 2022-10-29 ENCOUNTER — Ambulatory Visit
Admission: RE | Admit: 2022-10-29 | Discharge: 2022-10-29 | Disposition: A | Discharge status: Home / Self Care | Payer: Medicare Other | Source: Ambulatory Visit | Attending: Family Medicine | Admitting: Family Medicine

## 2022-10-29 DIAGNOSIS — Z1231 Encounter for screening mammogram for malignant neoplasm of breast: Secondary | ICD-10-CM | POA: Diagnosis not present

## 2022-12-22 DIAGNOSIS — I10 Essential (primary) hypertension: Secondary | ICD-10-CM | POA: Diagnosis not present

## 2022-12-22 DIAGNOSIS — E782 Mixed hyperlipidemia: Secondary | ICD-10-CM | POA: Diagnosis not present

## 2022-12-22 DIAGNOSIS — Z9989 Dependence on other enabling machines and devices: Secondary | ICD-10-CM | POA: Diagnosis not present

## 2022-12-22 DIAGNOSIS — K219 Gastro-esophageal reflux disease without esophagitis: Secondary | ICD-10-CM | POA: Diagnosis not present

## 2023-06-23 DIAGNOSIS — K219 Gastro-esophageal reflux disease without esophagitis: Secondary | ICD-10-CM | POA: Diagnosis not present

## 2023-06-23 DIAGNOSIS — Z23 Encounter for immunization: Secondary | ICD-10-CM | POA: Diagnosis not present

## 2023-06-23 DIAGNOSIS — Z9181 History of falling: Secondary | ICD-10-CM | POA: Diagnosis not present

## 2023-06-23 DIAGNOSIS — E782 Mixed hyperlipidemia: Secondary | ICD-10-CM | POA: Diagnosis not present

## 2023-06-23 DIAGNOSIS — I10 Essential (primary) hypertension: Secondary | ICD-10-CM | POA: Diagnosis not present

## 2023-06-23 DIAGNOSIS — Z0000 Encounter for general adult medical examination without abnormal findings: Secondary | ICD-10-CM | POA: Diagnosis not present

## 2023-09-15 ENCOUNTER — Other Ambulatory Visit: Payer: Self-pay | Admitting: Family Medicine

## 2023-09-15 DIAGNOSIS — Z1231 Encounter for screening mammogram for malignant neoplasm of breast: Secondary | ICD-10-CM

## 2023-10-19 DIAGNOSIS — J40 Bronchitis, not specified as acute or chronic: Secondary | ICD-10-CM | POA: Diagnosis not present

## 2023-11-04 ENCOUNTER — Ambulatory Visit
Admission: RE | Admit: 2023-11-04 | Discharge: 2023-11-04 | Disposition: A | Discharge status: Home / Self Care | Payer: Medicare Other | Source: Ambulatory Visit | Attending: Family Medicine | Admitting: Family Medicine

## 2023-11-04 DIAGNOSIS — Z1231 Encounter for screening mammogram for malignant neoplasm of breast: Secondary | ICD-10-CM | POA: Diagnosis not present

## 2023-12-27 DIAGNOSIS — Z23 Encounter for immunization: Secondary | ICD-10-CM | POA: Diagnosis not present

## 2023-12-27 DIAGNOSIS — E782 Mixed hyperlipidemia: Secondary | ICD-10-CM | POA: Diagnosis not present

## 2023-12-27 DIAGNOSIS — G43009 Migraine without aura, not intractable, without status migrainosus: Secondary | ICD-10-CM | POA: Diagnosis not present

## 2023-12-27 DIAGNOSIS — K219 Gastro-esophageal reflux disease without esophagitis: Secondary | ICD-10-CM | POA: Diagnosis not present

## 2023-12-27 DIAGNOSIS — I10 Essential (primary) hypertension: Secondary | ICD-10-CM | POA: Diagnosis not present

## 2024-05-15 DIAGNOSIS — M25372 Other instability, left ankle: Secondary | ICD-10-CM | POA: Diagnosis not present

## 2024-05-22 DIAGNOSIS — M25372 Other instability, left ankle: Secondary | ICD-10-CM | POA: Diagnosis not present

## 2024-05-22 DIAGNOSIS — M19172 Post-traumatic osteoarthritis, left ankle and foot: Secondary | ICD-10-CM | POA: Diagnosis not present

## 2024-07-04 DIAGNOSIS — E782 Mixed hyperlipidemia: Secondary | ICD-10-CM | POA: Diagnosis not present

## 2024-07-04 DIAGNOSIS — Z13820 Encounter for screening for osteoporosis: Secondary | ICD-10-CM | POA: Diagnosis not present

## 2024-07-04 DIAGNOSIS — Z86018 Personal history of other benign neoplasm: Secondary | ICD-10-CM | POA: Diagnosis not present

## 2024-07-04 DIAGNOSIS — H00015 Hordeolum externum left lower eyelid: Secondary | ICD-10-CM | POA: Diagnosis not present

## 2024-07-04 DIAGNOSIS — G43009 Migraine without aura, not intractable, without status migrainosus: Secondary | ICD-10-CM | POA: Diagnosis not present

## 2024-07-04 DIAGNOSIS — K219 Gastro-esophageal reflux disease without esophagitis: Secondary | ICD-10-CM | POA: Diagnosis not present

## 2024-07-04 DIAGNOSIS — I10 Essential (primary) hypertension: Secondary | ICD-10-CM | POA: Diagnosis not present

## 2024-07-04 DIAGNOSIS — Z0000 Encounter for general adult medical examination without abnormal findings: Secondary | ICD-10-CM | POA: Diagnosis not present

## 2024-07-05 DIAGNOSIS — M25572 Pain in left ankle and joints of left foot: Secondary | ICD-10-CM | POA: Diagnosis not present

## 2024-07-05 DIAGNOSIS — M25472 Effusion, left ankle: Secondary | ICD-10-CM | POA: Diagnosis not present

## 2024-07-05 DIAGNOSIS — M25372 Other instability, left ankle: Secondary | ICD-10-CM | POA: Diagnosis not present

## 2024-07-09 ENCOUNTER — Other Ambulatory Visit (HOSPITAL_BASED_OUTPATIENT_CLINIC_OR_DEPARTMENT_OTHER): Payer: Self-pay | Admitting: Family Medicine

## 2024-07-09 ENCOUNTER — Other Ambulatory Visit: Payer: Self-pay | Admitting: Orthopedic Surgery

## 2024-07-09 DIAGNOSIS — Z13820 Encounter for screening for osteoporosis: Secondary | ICD-10-CM

## 2024-07-09 DIAGNOSIS — E2839 Other primary ovarian failure: Secondary | ICD-10-CM

## 2024-07-09 DIAGNOSIS — M19172 Post-traumatic osteoarthritis, left ankle and foot: Secondary | ICD-10-CM | POA: Diagnosis not present

## 2024-07-09 DIAGNOSIS — M6702 Short Achilles tendon (acquired), left ankle: Secondary | ICD-10-CM | POA: Diagnosis not present

## 2024-07-24 ENCOUNTER — Ambulatory Visit (HOSPITAL_BASED_OUTPATIENT_CLINIC_OR_DEPARTMENT_OTHER)
Admission: RE | Admit: 2024-07-24 | Discharge: 2024-07-24 | Disposition: A | Discharge status: Home / Self Care | Source: Ambulatory Visit | Attending: Family Medicine | Admitting: Family Medicine

## 2024-07-24 DIAGNOSIS — Z13820 Encounter for screening for osteoporosis: Secondary | ICD-10-CM | POA: Diagnosis not present

## 2024-07-24 DIAGNOSIS — E2839 Other primary ovarian failure: Secondary | ICD-10-CM | POA: Insufficient documentation

## 2024-07-24 DIAGNOSIS — Z780 Asymptomatic menopausal state: Secondary | ICD-10-CM | POA: Diagnosis not present

## 2024-07-24 DIAGNOSIS — M8589 Other specified disorders of bone density and structure, multiple sites: Secondary | ICD-10-CM | POA: Diagnosis not present

## 2024-08-10 DIAGNOSIS — M25472 Effusion, left ankle: Secondary | ICD-10-CM | POA: Diagnosis not present

## 2024-08-10 DIAGNOSIS — M25372 Other instability, left ankle: Secondary | ICD-10-CM | POA: Diagnosis not present

## 2024-08-10 DIAGNOSIS — M25572 Pain in left ankle and joints of left foot: Secondary | ICD-10-CM | POA: Diagnosis not present

## 2024-08-16 ENCOUNTER — Encounter (HOSPITAL_BASED_OUTPATIENT_CLINIC_OR_DEPARTMENT_OTHER): Payer: Self-pay | Admitting: Orthopedic Surgery

## 2024-08-17 ENCOUNTER — Encounter (HOSPITAL_BASED_OUTPATIENT_CLINIC_OR_DEPARTMENT_OTHER)
Admission: RE | Admit: 2024-08-17 | Discharge: 2024-08-17 | Disposition: A | Discharge status: Home / Self Care | Source: Ambulatory Visit | Attending: Orthopedic Surgery | Admitting: Orthopedic Surgery

## 2024-08-17 DIAGNOSIS — Z01818 Encounter for other preprocedural examination: Secondary | ICD-10-CM | POA: Diagnosis not present

## 2024-08-17 LAB — BASIC METABOLIC PANEL WITH GFR
Anion gap: 6 (ref 5–15)
BUN: 15 mg/dL (ref 8–23)
CO2: 27 mmol/L (ref 22–32)
Calcium: 9.1 mg/dL (ref 8.9–10.3)
Chloride: 105 mmol/L (ref 98–111)
Creatinine, Ser: 0.7 mg/dL (ref 0.44–1.00)
GFR, Estimated: >60 mL/min (ref >60)
Glucose, Bld: 174 mg/dL — ABNORMAL HIGH (ref 70–99)
Potassium: 3.3 mmol/L — ABNORMAL LOW (ref 3.5–5.1)
Sodium: 138 mmol/L (ref 135–145)

## 2024-08-17 NOTE — Progress Notes (Signed)

## 2024-08-22 NOTE — Anesthesia Preprocedure Evaluation (Signed)
 Anesthesia Evaluation  Patient identified by MRN, date of birth, ID band Patient awake    Reviewed: Allergy & Precautions, NPO status , Patient's Chart, lab work & pertinent test results  History of Anesthesia Complications Negative for: history of anesthetic complications  Airway Mallampati: II  TM Distance: >3 FB Neck ROM: Full    Dental no notable dental hx. (+) Teeth Intact, Dental Advisory Given   Pulmonary  COPD inhaler   Pulmonary exam normal breath sounds clear to auscultation       Cardiovascular hypertension, (-) angina (-) Past MI Normal cardiovascular exam Rhythm:Regular Rate:Normal     Neuro/Psych  PSYCHIATRIC DISORDERS Anxiety Depression       GI/Hepatic Neg liver ROS,GERD  ,,  Endo/Other  negative endocrine ROS    Renal/GU negative Renal ROSLab Results      Component                Value               Date                               K                        3.3 (L)             08/17/2024                  BUN                      15                  08/17/2024                CREATININE               0.70                08/17/2024                GFRNONAA                 >60                 08/17/2024                      GLUCOSE                  174 (H)             08/17/2024                Musculoskeletal negative musculoskeletal ROS (+)    Abdominal   Peds  Hematology negative hematology ROS (+)   Anesthesia Other Findings All: Heparin , enoxaparin   Reproductive/Obstetrics negative OB ROS                              Anesthesia Physical Anesthesia Plan  ASA: 2  Anesthesia Plan: General and Regional   Post-op Pain Management: Regional block* and Minimal or no pain anticipated   Induction: Intravenous  PONV Risk Score and Plan: 4 or greater and Propofol infusion, TIVA, Treatment may vary due to age or medical condition, Midazolam  and Ondansetron   Airway  Management Planned: LMA  Additional Equipment: None  Intra-op Plan:   Post-operative  Plan:   Informed Consent:      Dental advisory given  Plan Discussed with: CRNA and Surgeon  Anesthesia Plan Comments: (LMA w L add+ L pop/sciatic)         Anesthesia Quick Evaluation

## 2024-08-23 ENCOUNTER — Other Ambulatory Visit: Payer: Self-pay

## 2024-08-23 ENCOUNTER — Ambulatory Visit (HOSPITAL_BASED_OUTPATIENT_CLINIC_OR_DEPARTMENT_OTHER)
Admission: RE | Admit: 2024-08-23 | Discharge: 2024-08-23 | Disposition: A | Discharge status: Home / Self Care | Attending: Orthopedic Surgery | Admitting: Orthopedic Surgery

## 2024-08-23 ENCOUNTER — Encounter (HOSPITAL_BASED_OUTPATIENT_CLINIC_OR_DEPARTMENT_OTHER): Admitting: Anesthesiology

## 2024-08-23 ENCOUNTER — Encounter (HOSPITAL_BASED_OUTPATIENT_CLINIC_OR_DEPARTMENT_OTHER)
Admission: RE | Disposition: A | Discharge status: Home / Self Care | Payer: Self-pay | Source: Home / Self Care | Attending: Orthopedic Surgery

## 2024-08-23 ENCOUNTER — Ambulatory Visit (HOSPITAL_COMMUNITY)

## 2024-08-23 ENCOUNTER — Encounter (HOSPITAL_BASED_OUTPATIENT_CLINIC_OR_DEPARTMENT_OTHER): Payer: Self-pay | Admitting: Orthopedic Surgery

## 2024-08-23 ENCOUNTER — Ambulatory Visit (HOSPITAL_BASED_OUTPATIENT_CLINIC_OR_DEPARTMENT_OTHER): Admitting: Anesthesiology

## 2024-08-23 DIAGNOSIS — I10 Essential (primary) hypertension: Secondary | ICD-10-CM | POA: Diagnosis not present

## 2024-08-23 DIAGNOSIS — K219 Gastro-esophageal reflux disease without esophagitis: Secondary | ICD-10-CM | POA: Insufficient documentation

## 2024-08-23 DIAGNOSIS — M19172 Post-traumatic osteoarthritis, left ankle and foot: Secondary | ICD-10-CM | POA: Diagnosis not present

## 2024-08-23 DIAGNOSIS — M6702 Short Achilles tendon (acquired), left ankle: Secondary | ICD-10-CM | POA: Diagnosis not present

## 2024-08-23 DIAGNOSIS — Z7901 Long term (current) use of anticoagulants: Secondary | ICD-10-CM | POA: Diagnosis not present

## 2024-08-23 DIAGNOSIS — X58XXXA Exposure to other specified factors, initial encounter: Secondary | ICD-10-CM | POA: Diagnosis not present

## 2024-08-23 DIAGNOSIS — S8262XA Displaced fracture of lateral malleolus of left fibula, initial encounter for closed fracture: Secondary | ICD-10-CM | POA: Insufficient documentation

## 2024-08-23 DIAGNOSIS — G8918 Other acute postprocedural pain: Secondary | ICD-10-CM | POA: Diagnosis not present

## 2024-08-23 DIAGNOSIS — F32A Depression, unspecified: Secondary | ICD-10-CM | POA: Insufficient documentation

## 2024-08-23 DIAGNOSIS — F419 Anxiety disorder, unspecified: Secondary | ICD-10-CM | POA: Diagnosis not present

## 2024-08-23 DIAGNOSIS — J449 Chronic obstructive pulmonary disease, unspecified: Secondary | ICD-10-CM | POA: Insufficient documentation

## 2024-08-23 DIAGNOSIS — M25372 Other instability, left ankle: Secondary | ICD-10-CM | POA: Diagnosis not present

## 2024-08-23 HISTORY — PX: ORIF ANKLE FRACTURE: SHX5408

## 2024-08-23 HISTORY — PX: ANKLE RECONSTRUCTION: SHX1151

## 2024-08-23 HISTORY — PX: TOTAL ANKLE ARTHROPLASTY: SHX811

## 2024-08-23 HISTORY — PX: ACHILLES TENDON LENGTHENING: SHX6455

## 2024-08-23 SURGERY — ARTHROPLASTY, ANKLE, TOTAL
Anesthesia: Regional | Site: Foot | Laterality: Left

## 2024-08-23 MED ORDER — FENTANYL CITRATE (PF) 100 MCG/2ML IJ SOLN
INTRAMUSCULAR | Status: DC | PRN
  Administered 2024-08-23 (×2): 50 ug via INTRAVENOUS

## 2024-08-23 MED ORDER — HYDROMORPHONE HCL 1 MG/ML IJ SOLN
INTRAMUSCULAR | Status: AC
  Filled 2024-08-23: qty 0.5

## 2024-08-23 MED ORDER — ONDANSETRON HCL 4 MG/2ML IJ SOLN: 4.0000 mg | Freq: Once | INTRAMUSCULAR | Status: DC | PRN

## 2024-08-23 MED ORDER — PROPOFOL 10 MG/ML IV BOLUS
INTRAVENOUS | Status: AC
  Filled 2024-08-23: qty 20

## 2024-08-23 MED ORDER — PHENYLEPHRINE 80 MCG/ML (10ML) SYRINGE FOR IV PUSH (FOR BLOOD PRESSURE SUPPORT)
PREFILLED_SYRINGE | INTRAVENOUS | Status: AC
  Filled 2024-08-23: qty 30

## 2024-08-23 MED ORDER — PROPOFOL 500 MG/50ML IV EMUL
INTRAVENOUS | Status: DC | PRN
Start: 2024-08-23 — End: 2024-08-23
  Administered 2024-08-23: 150 ug/kg/min via INTRAVENOUS

## 2024-08-23 MED ORDER — LIDOCAINE 2% (20 MG/ML) 5 ML SYRINGE
INTRAMUSCULAR | Status: AC
  Filled 2024-08-23: qty 10

## 2024-08-23 MED ORDER — GLYCOPYRROLATE PF 0.2 MG/ML IJ SOSY
PREFILLED_SYRINGE | INTRAMUSCULAR | Status: DC | PRN
  Administered 2024-08-23: .2 mg via INTRAVENOUS

## 2024-08-23 MED ORDER — ONDANSETRON HCL 4 MG/2ML IJ SOLN
INTRAMUSCULAR | Status: AC
  Filled 2024-08-23: qty 2

## 2024-08-23 MED ORDER — FENTANYL CITRATE (PF) 100 MCG/2ML IJ SOLN
INTRAMUSCULAR | Status: AC
  Filled 2024-08-23: qty 2

## 2024-08-23 MED ORDER — VANCOMYCIN HCL 500 MG IV SOLR
INTRAVENOUS | Status: DC | PRN
  Administered 2024-08-23: 500 mg via TOPICAL

## 2024-08-23 MED ORDER — FENTANYL CITRATE (PF) 100 MCG/2ML IJ SOLN
50.0000 ug | Freq: Once | INTRAMUSCULAR | Status: AC
  Administered 2024-08-23: 50 ug via INTRAVENOUS

## 2024-08-23 MED ORDER — PROPOFOL 10 MG/ML IV BOLUS
INTRAVENOUS | Status: DC | PRN
  Administered 2024-08-23: 150 mg via INTRAVENOUS
  Administered 2024-08-23: 50 mg via INTRAVENOUS

## 2024-08-23 MED ORDER — ROPIVACAINE HCL 5 MG/ML IJ SOLN
INTRAMUSCULAR | Status: DC | PRN
  Administered 2024-08-23: 15 mL via PERINEURAL
  Administered 2024-08-23: 30 mL via PERINEURAL

## 2024-08-23 MED ORDER — DOCUSATE SODIUM 100 MG PO CAPS: 100.0000 mg | ORAL_CAPSULE | Freq: Two times a day (BID) | ORAL | 0 refills | Status: AC

## 2024-08-23 MED ORDER — LACTATED RINGERS IV SOLN
INTRAVENOUS | Status: DC
Start: 2024-08-23 — End: 2024-08-23

## 2024-08-23 MED ORDER — CEFAZOLIN SODIUM-DEXTROSE 2-4 GM/100ML-% IV SOLN
INTRAVENOUS | Status: AC
  Filled 2024-08-23: qty 100

## 2024-08-23 MED ORDER — SURGIPHOR WOUND IRRIGATION SYSTEM - OPTIME
TOPICAL | Status: DC | PRN
  Administered 2024-08-23: 150 mL via TOPICAL

## 2024-08-23 MED ORDER — EPHEDRINE 5 MG/ML INJ
INTRAVENOUS | Status: AC
  Filled 2024-08-23: qty 15

## 2024-08-23 MED ORDER — OXYCODONE HCL 5 MG/5ML PO SOLN: 5.0000 mg | Freq: Once | ORAL | Status: DC | PRN

## 2024-08-23 MED ORDER — FENTANYL CITRATE (PF) 100 MCG/2ML IJ SOLN: 25.0000 ug | INTRAMUSCULAR | Status: DC | PRN

## 2024-08-23 MED ORDER — OXYCODONE HCL 5 MG PO TABS: 5.0000 mg | ORAL_TABLET | Freq: Four times a day (QID) | ORAL | 0 refills | Status: AC | PRN

## 2024-08-23 MED ORDER — CLONIDINE HCL (ANALGESIA) 100 MCG/ML EP SOLN
EPIDURAL | Status: DC | PRN
  Administered 2024-08-23 (×2): 100 ug

## 2024-08-23 MED ORDER — DEXAMETHASONE SODIUM PHOSPHATE 10 MG/ML IJ SOLN
INTRAMUSCULAR | Status: AC
  Filled 2024-08-23: qty 1

## 2024-08-23 MED ORDER — MIDAZOLAM HCL 2 MG/2ML IJ SOLN
1.0000 mg | Freq: Once | INTRAMUSCULAR | Status: AC
Start: 2024-08-23 — End: 2024-08-23
  Administered 2024-08-23: 1 mg via INTRAVENOUS

## 2024-08-23 MED ORDER — LIDOCAINE HCL (CARDIAC) PF 100 MG/5ML IV SOSY
PREFILLED_SYRINGE | INTRAVENOUS | Status: DC | PRN
  Administered 2024-08-23: 100 mg via INTRAVENOUS

## 2024-08-23 MED ORDER — DEXMEDETOMIDINE HCL IN NACL 80 MCG/20ML IV SOLN
INTRAVENOUS | Status: DC | PRN
  Administered 2024-08-23 (×2): 4 ug via INTRAVENOUS

## 2024-08-23 MED ORDER — DEXMEDETOMIDINE HCL IN NACL 80 MCG/20ML IV SOLN
INTRAVENOUS | Status: AC
  Filled 2024-08-23: qty 20

## 2024-08-23 MED ORDER — SENNA 8.6 MG PO TABS
2.0000 | ORAL_TABLET | Freq: Two times a day (BID) | ORAL | 0 refills | Status: AC
Start: 2024-08-23 — End: ?

## 2024-08-23 MED ORDER — OXYCODONE HCL 5 MG PO TABS: 5.0000 mg | ORAL_TABLET | Freq: Once | ORAL | Status: DC | PRN

## 2024-08-23 MED ORDER — CEFAZOLIN SODIUM-DEXTROSE 2-4 GM/100ML-% IV SOLN
2.0000 g | INTRAVENOUS | Status: AC
  Administered 2024-08-23: 2 g via INTRAVENOUS

## 2024-08-23 MED ORDER — 0.9 % SODIUM CHLORIDE (POUR BTL) OPTIME
TOPICAL | Status: DC | PRN
  Administered 2024-08-23: 300 mL

## 2024-08-23 MED ORDER — RIVAROXABAN 10 MG PO TABS: 10.0000 mg | ORAL_TABLET | Freq: Every day | ORAL | 0 refills | Status: AC

## 2024-08-23 MED ORDER — VANCOMYCIN HCL 500 MG IV SOLR
INTRAVENOUS | Status: AC
  Filled 2024-08-23: qty 10

## 2024-08-23 MED ORDER — MIDAZOLAM HCL 2 MG/2ML IJ SOLN
INTRAMUSCULAR | Status: AC
  Filled 2024-08-23: qty 2

## 2024-08-23 MED ORDER — HYDROMORPHONE HCL 1 MG/ML IJ SOLN
INTRAMUSCULAR | Status: DC | PRN
  Administered 2024-08-23: .5 mg via INTRAVENOUS

## 2024-08-23 MED ORDER — EPHEDRINE SULFATE (PRESSORS) 50 MG/ML IJ SOLN
INTRAMUSCULAR | Status: DC | PRN
  Administered 2024-08-23 (×2): 5 mg via INTRAVENOUS
  Administered 2024-08-23: 10 mg via INTRAVENOUS

## 2024-08-23 MED ORDER — DEXAMETHASONE SODIUM PHOSPHATE 10 MG/ML IJ SOLN
INTRAMUSCULAR | Status: DC | PRN
  Administered 2024-08-23: 10 mg via INTRAVENOUS

## 2024-08-23 MED ORDER — PHENYLEPHRINE HCL (PRESSORS) 10 MG/ML IV SOLN
INTRAVENOUS | Status: DC | PRN
  Administered 2024-08-23: 160 ug via INTRAVENOUS
  Administered 2024-08-23: 80 ug via INTRAVENOUS

## 2024-08-23 MED ORDER — ACETAMINOPHEN 10 MG/ML IV SOLN: 1000.0000 mg | Freq: Once | INTRAVENOUS | Status: DC | PRN

## 2024-08-23 SURGICAL SUPPLY — 82 items
ANCH SUT JUGGERKNOT 2X1.45 BL (Anchor) IMPLANT
BIT DRILL 2.9 CANN QC NONSTRL (BIT) IMPLANT
BLADE RECIPRO TAPERED (BLADE) ×3 IMPLANT
BLADE SAW OSC ANKLE 8X63X1.19 (BLADE) IMPLANT
BLADE SAW RECIP ANKLE 8X50X1 (PIN) IMPLANT
BLADE SAW SGTL 13.0X1.19X90.0M (BLADE) IMPLANT
BLADE SURG 15 STRL LF DISP TIS (BLADE) ×9 IMPLANT
BNDG COMPR ESMARK 6X3 LF (GAUZE/BANDAGES/DRESSINGS) IMPLANT
BNDG ELASTIC 4INX 5YD STR LF (GAUZE/BANDAGES/DRESSINGS) ×6 IMPLANT
CHLORAPREP W/TINT 26 (MISCELLANEOUS) ×3 IMPLANT
CLIP LOCKING VANTAGE SZ 2 (Clip) IMPLANT
COVER BACK TABLE 60X90IN (DRAPES) ×6 IMPLANT
COVER MAYO STAND STRL (DRAPES) ×3 IMPLANT
CUFF TRNQT CYL 34X4.125X (TOURNIQUET CUFF) IMPLANT
DRAPE C-ARM 42X72 X-RAY (DRAPES) ×3 IMPLANT
DRAPE C-ARMOR (DRAPES) ×3 IMPLANT
DRAPE EXTREMITY T 121X128X90 (DISPOSABLE) ×3 IMPLANT
DRAPE IMP U-DRAPE 54X76 (DRAPES) ×3 IMPLANT
DRAPE U-SHAPE 47X51 STRL (DRAPES) ×3 IMPLANT
DRESSING MEPILEX FLEX 4X4 (GAUZE/BANDAGES/DRESSINGS) ×3 IMPLANT
DRSG AQUACEL AG ADV 3.5X10 (GAUZE/BANDAGES/DRESSINGS) ×3 IMPLANT
DRSG MEPITEL 4X7.2 (GAUZE/BANDAGES/DRESSINGS) ×3 IMPLANT
DURAPREP 26ML APPLICATOR (WOUND CARE) IMPLANT
ELECTRODE REM PT RTRN 9FT ADLT (ELECTROSURGICAL) ×3 IMPLANT
GAUZE PAD ABD 8X10 STRL (GAUZE/BANDAGES/DRESSINGS) ×6 IMPLANT
GAUZE SPONGE 4X4 12PLY STRL (GAUZE/BANDAGES/DRESSINGS) ×3 IMPLANT
GLOVE BIO SURGEON STRL SZ7 (GLOVE) IMPLANT
GLOVE BIO SURGEON STRL SZ8 (GLOVE) ×3 IMPLANT
GLOVE BIOGEL PI IND STRL 7.0 (GLOVE) IMPLANT
GLOVE BIOGEL PI IND STRL 7.5 (GLOVE) IMPLANT
GLOVE BIOGEL PI IND STRL 8 (GLOVE) ×6 IMPLANT
GLOVE ECLIPSE 8.0 STRL XLNG CF (GLOVE) ×3 IMPLANT
GLOVE SURG SS PI 7.0 STRL IVOR (GLOVE) IMPLANT
GOWN STRL REUS W/ TWL LRG LVL3 (GOWN DISPOSABLE) ×3 IMPLANT
GOWN STRL REUS W/ TWL XL LVL3 (GOWN DISPOSABLE) ×6 IMPLANT
GOWN STRL REUS W/TWL XL LVL3 (GOWN DISPOSABLE) ×3 IMPLANT
IMPL TALAR ANKLE SZ 2 LT (Ankle) IMPLANT
INSERT TIB SZ 2 LEFT 8 (Orthopedic Implant) IMPLANT
JUGGERKNOT 1.4 SHORT SNG LOAD (BIT) IMPLANT
NDL HYPO 22X1.5 SAFETY MO (MISCELLANEOUS) IMPLANT
NDL HYPO 25X1 1.5 SAFETY (NEEDLE) IMPLANT
NEEDLE HYPO 22X1.5 SAFETY MO (MISCELLANEOUS) IMPLANT
NEEDLE HYPO 25X1 1.5 SAFETY (NEEDLE) IMPLANT
NS IRRIG 1000ML POUR BTL (IV SOLUTION) ×3 IMPLANT
PACK BASIN DAY SURGERY FS (CUSTOM PROCEDURE TRAY) ×3 IMPLANT
PAD CAST 4YDX4 CTTN HI CHSV (CAST SUPPLIES) ×3 IMPLANT
PADDING CAST ABS COTTON 4X4 ST (CAST SUPPLIES) IMPLANT
PADDING CAST COTTON 6X4 STRL (CAST SUPPLIES) ×3 IMPLANT
PENCIL SMOKE EVACUATOR (MISCELLANEOUS) ×3 IMPLANT
PIN POUCH TALAR VANTAGE 2.0 (PIN) IMPLANT
PIN POUCH TALAR VANTAGE 2.5 (PIN) IMPLANT
PIN POUCH TALAR VANTAGE 3.5 (PIN) IMPLANT
PLATE TIB FB ANKLE SZ 2 LT (Ankle) IMPLANT
POUCH SCREW TALAR ANKLE (ORTHOPEDIC DISPOSABLE SUPPLIES) IMPLANT
SANITIZER HAND ALTRA PUMP 550 (MISCELLANEOUS) ×3 IMPLANT
SCOTCHCAST PLUS 3X4 WHITE (CAST SUPPLIES) IMPLANT
SCOTCHCAST PLUS 4X4 WHITE (CAST SUPPLIES) IMPLANT
SCREW ACE CAN 4.0 60M (Screw) IMPLANT
SCREW CORTICAL 3.5MM 50MM (Screw) IMPLANT
SHEET MEDIUM DRAPE 40X70 STRL (DRAPES) ×3 IMPLANT
SLEEVE SCD COMPRESS KNEE MED (STOCKING) ×3 IMPLANT
SPIKE FLUID TRANSFER (MISCELLANEOUS) IMPLANT
SPONGE T-LAP 18X18 ~~LOC~~+RFID (SPONGE) ×3 IMPLANT
STOCKINETTE 6 STRL (DRAPES) ×3 IMPLANT
STRIP CLOSURE SKIN 1/2X4 (GAUZE/BANDAGES/DRESSINGS) ×3 IMPLANT
SUCTION TUBE FRAZIER 10FR DISP (SUCTIONS) IMPLANT
SURGILUBE 2OZ TUBE FLIPTOP (MISCELLANEOUS) ×3 IMPLANT
SUT ETHILON 3 0 PS 1 (SUTURE) IMPLANT
SUT MNCRL AB 3-0 PS2 18 (SUTURE) ×3 IMPLANT
SUT STRATA PDS 3-0 PS-1 (SUTURE) ×3 IMPLANT
SUT VIC AB 0 CT1 27XBRD ANBCTR (SUTURE) ×3 IMPLANT
SUT VIC AB 2-0 SH 27XBRD (SUTURE) IMPLANT
SUT VICRYL 0 SH 27 (SUTURE) IMPLANT
SUT VICRYL 0 UR6 27IN ABS (SUTURE) IMPLANT
SYR 20ML LL LF (SYRINGE) IMPLANT
SYR BULB EAR ULCER 3OZ GRN STR (SYRINGE) IMPLANT
SYR BULB IRRIG 60ML STRL (SYRINGE) ×3 IMPLANT
SYR CONTROL 10ML LL (SYRINGE) IMPLANT
TOWEL GREEN STERILE FF (TOWEL DISPOSABLE) ×6 IMPLANT
TUBE CONNECTING 20X1/4 (TUBING) ×3 IMPLANT
UNDERPAD 30X36 HEAVY ABSORB (UNDERPADS AND DIAPERS) ×3 IMPLANT
YANKAUER SUCT BULB TIP NO VENT (SUCTIONS) ×3 IMPLANT

## 2024-08-23 NOTE — Anesthesia Postprocedure Evaluation (Signed)
 Anesthesia Post Note  Patient: Hannah Jefferson  Procedure(s) Performed: Left total ankle replacement (Left: Ankle) Achilles tendon lengthening (Left: Foot) Open Treatment Lateral Malleolar Fracture (Left: Ankle) Lateral Ligament Reconstruction (Left: Ankle)     Patient location during evaluation: PACU Anesthesia Type: Regional and General Level of consciousness: awake and alert Pain management: pain level controlled Vital Signs Assessment: post-procedure vital signs reviewed and stable Respiratory status: spontaneous breathing, nonlabored ventilation, respiratory function stable and patient connected to nasal cannula oxygen Cardiovascular status: blood pressure returned to baseline and stable Postop Assessment: no apparent nausea or vomiting Anesthetic complications: no   No notable events documented.  Last Vitals:  Vitals:   08/23/24 1449 08/23/24 1530  BP: (!) 115/51 (!) 114/52  Pulse: 71   Resp: 18   Temp: 36.6 C   SpO2: 95% 95%    Last Pain:  Vitals:   08/23/24 1449  TempSrc:   PainSc: 0-No pain                 Garnette [name]

## 2024-08-23 NOTE — H&P (Signed)
 Hannah Jefferson is an 68 y.o. female.   Chief Complaint: left ankle pain HPI: 68 y/o female with a long history of worsening left ankle pain due to post traumatic arthritis.  She has failed non op treatment and presents today for left total ankle replacement.  Past Medical History:  Diagnosis Date   Chronic left hip pain    GERD (gastroesophageal reflux disease)    Hearing loss of both ears    Hypertension    Neuropathy    numbness/tingling from waist down   Vision abnormalities     Past Surgical History:  Procedure Laterality Date   IR IVC FILTER PLMT / S&I /IMG GUID/MOD SED  04/05/2018   IR IVC FILTER RETRIEVAL / S&I /IMG GUID/MOD SED  05/10/2019   IR RADIOLOGIST EVAL & MGMT  05/02/2019   LAPAROSCOPIC CHOLECYSTECTOMY  1992   REFRACTIVE SURGERY Bilateral 2004    Family History  Problem Relation Age of Onset   Dementia Mother    COPD Mother    Heart disease Father    Thyroid  nodules Sister    Diabetes Mellitus II Sister    Ovarian cancer Maternal Aunt 35 - 49   Heart disease Brother    Diabetes Mellitus II Brother    Diabetes Mellitus II Brother    Heart disease Brother    Breast cancer Neg Hx    Social History:  reports that she has never smoked. She has never used smokeless tobacco. She reports that she does not drink alcohol and does not use drugs.  Allergies:  Allergies  Allergen Reactions   Enoxaparin      Major bleeding   Heparin      HIT (Heparin  antibody = 2.866, Serotonin 65%. positive    Medications Prior to Admission  Medication Sig Dispense Refill   escitalopram (LEXAPRO) 10 MG tablet Take 10 mg by mouth daily.     hydrochlorothiazide  (MICROZIDE ) 12.5 MG capsule Take 12.5 mg by mouth daily.     lisinopril  (PRINIVIL ,ZESTRIL ) 10 MG tablet Take 10 mg by mouth daily.     omeprazole (PRILOSEC) 40 MG capsule omeprazole 40 mg capsule,delayed release     acetaminophen  (TYLENOL ) 500 MG tablet Take 500 mg by mouth every 6 (six) hours as needed.     albuterol   (VENTOLIN  HFA) 108 (90 Base) MCG/ACT inhaler Inhale 2 puffs into the lungs every 6 (six) hours as needed for wheezing or shortness of breath. 8 g 0   calcium  carbonate (TUMS - DOSED IN MG ELEMENTAL CALCIUM ) 500 MG chewable tablet Chew 1 tablet by mouth 3 (three) times daily as needed for indigestion or heartburn.     dabigatran (PRADAXA) 150 MG CAPS capsule Take 150 mg by mouth 2 (two) times daily. (Patient not taking: Reported on 12/23/2020)     docusate sodium (COLACE) 100 MG capsule Take 100 mg by mouth daily as needed for mild constipation. (Patient not taking: Reported on 12/23/2020)     doxycycline  (VIBRAMYCIN ) 100 MG capsule Take 1 capsule (100 mg total) by mouth 2 (two) times daily. 20 capsule 0   methocarbamol  (ROBAXIN ) 500 MG tablet Patient takes as needed     polycarbophil (FIBERCON) 625 MG tablet Take 1 tablet (625 mg total) by mouth daily.     predniSONE  (DELTASONE ) 50 MG tablet One tablet a day 6 tablet 0   Probiotic Product (ALIGN PO) Take by mouth.     ranitidine (ZANTAC) 150 MG tablet Take 150 mg by mouth 2 (two) times daily. (Patient not taking:  Reported on 12/23/2020)      No results found for this or any previous visit (from the past 48 hours). No results found.  Review of Systems  no recent f/c/n/v/wt loss  Height 5' 3 (1.6 m), weight 85.7 kg. Physical Exam  Wn wd woman in nad.  A and O.  EOMI.  Resp unlabored.  L ankle with tight heelcord.  Small effusion.  5/5 strength in PF and DF of the ankle and toes.  No lymphadenopathy.  Skin is healthy and intact.  Pulses are palpable in the foot. Assessment/Plan L ankle post traumatic arthritis and tight heelcord.  To the OR today for left achilles tendon lengthening and total ankle replacement.  The risks and benefits of the alternative treatment options have been discussed in detail.  The patient wishes to proceed with surgery and specifically understands risks of bleeding, infection, nerve damage, blood clots, need for additional  surgery, amputation and death.   Norleen Armor, MD Aug 24, 2024, 7:24 AM

## 2024-08-23 NOTE — Transfer of Care (Signed)
 Immediate Anesthesia Transfer of Care Note  Patient: Hannah Jefferson  Procedure(s) Performed: Left total ankle replacement (Left: Ankle) Achilles tendon lengthening (Left: Foot) Open Treatment Lateral Malleolar Fracture (Left: Ankle) Lateral Ligament Reconstruction (Left: Ankle)  Patient Location: PACU  Anesthesia Type:GA combined with regional for post-op pain  Level of Consciousness: awake, alert , and oriented  Airway & Oxygen Therapy: Patient Spontanous Breathing and Patient connected to face mask oxygen  Post-op Assessment: Report given to RN and Post -op Vital signs reviewed and stable  Post vital signs: Reviewed and stable  Last Vitals:  Vitals Value Taken Time  BP 120/52 08/23/24 13:45  Temp 36.7 C 08/23/24 13:45  Pulse 78 08/23/24 13:51  Resp 18 08/23/24 13:51  SpO2 98 % 08/23/24 13:51  Vitals shown include unfiled device data.  Last Pain:  Vitals:   08/23/24 0731  TempSrc: Temporal  PainSc: 9       Patients Stated Pain Goal: 4 (08/23/24 0731)  Complications: No notable events documented.

## 2024-08-23 NOTE — Progress Notes (Signed)
Assisted Dr. Houser with left, adductor canal, popliteal, ultrasound guided block. Side rails up, monitors on throughout procedure. See vital signs in flow sheet. Tolerated Procedure well. 

## 2024-08-23 NOTE — Op Note (Signed)
 08/23/2024  1:42 PM  PATIENT:  Hannah Jefferson  68 y.o. female  PRE-OPERATIVE DIAGNOSIS: 1.  Left ankle posttraumatic arthritis 2.  Short left Achilles tendon  POST-OPERATIVE DIAGNOSIS: 1.  Left ankle posttraumatic arthritis 2.  Short left Achilles tendon 3.  Left ankle chronic lateral ligament instability  Procedure(s): 1.  Left Achilles tendon lengthening 2.  Left total ankle replacement 3.  Open treatment of left ankle lateral malleolus fracture with internal fixation 4.  Prophylactic screw fixation of the left ankle medial malleolus 5.  Left ankle lateral ligament reconstruction  SURGEON:  Norleen Armor, MD  ASSISTANT: Eva Barrack, PA-C  ANESTHESIA:   General, regional  EBL:  minimal   TOURNIQUET:   Total Tourniquet Time Documented: Thigh (Left) - 120 minutes Total: Thigh (Left) - 120 minutes  COMPLICATIONS: Intraoperative fracture of the left ankle lateral malleolus  DISPOSITION:  Extubated, awake and stable to recovery.  INDICATION FOR PROCEDURE: 68 year old female with a long history of worsening left ankle pain due to posttraumatic arthritis and chronic lateral ligament instability has failed nonoperative treatment and presents today for left total ankle replacement.  She also has a tight heel cord and will likely need Achilles tendon lengthening.  The risks and benefits of the alternative treatment options have been discussed in detail.  The patient wishes to proceed with surgery and specifically understands risks of bleeding, infection, nerve damage, blood clots, need for additional surgery, amputation and death.   PROCEDURE IN DETAIL:  After pre operative consent was obtained, and the correct operative site was identified, the patient was brought to the operating room and placed supine on the OR table.  Anesthesia was administered.  Pre-operative antibiotics were administered.  A surgical timeout was taken.  The left lower extremity was prepped and draped in standard  sterile fashion with a tourniquet around the thigh.  The extremity was elevated, and the tourniquet was inflated to 250 mmHg.  An anterior incision was made over the EHL tendon.  Dissection was carried down through the subcutaneous tissues.  The extensor retinaculum was incised over the EHL and released proximally and distally.  The neurovascular bundle was identified.  It was mobilized and retracted laterally.  Was protected throughout the case.  The anterior joint capsule was incised and elevated medially and laterally.  Anterior osteophytes were resected with a reciprocating saw and curved osteotome.  The heel cord was noted to still be tight.  Achilles tendon lengthening was performed using a triple hemisection technique with a #15 scalpel.  The tibial alignment guide was then positioned proximally on a pin at the tibial tubercle and distally in line with the medial corner of the plafond.  An AP radiograph confirmed appropriate alignment along the tibial shaft.  The guide was then provisionally pinned at the proximal end of the cutting block.  A lateral view was then obtained.  At the resection height was set in line with the tibial plafond.  Slope was confirmed and the block was pinned at the next segment.  An AP radiograph confirmed appropriate varus valgus alignment at the distal tibia.  The block was translated medially in line with the medial gutter and pinned.  The distal tibial cut was made with the oscillating saw followed by the reciprocating saw.  The cut fragment of bone was removed in its entirety.  A notch was noted in the fibula.  The fibula was carefully examined and was noted to be fractured.  The decision was made to proceed with  fixation.  A guidepin was inserted in the tip of the fibula distally and advanced proximally into the fibular shaft across the fracture site after reducing the fracture.  The guidepin was overdrilled.  A 4 mm partially-threaded cannulated screw from the [employer]  small frag set was inserted.  This compressed the fracture site appropriately and stabilized the fibula.  Attention was then returned to the talus.  The remaining articular cartilage from the lateral half of the talar dome joint was removed with a periosteal elevator.  The talar cutting guide was then positioned appropriately.  A lateral view confirmed appropriate position of the guide.  The guide was pinned.  The talus was cut and waste bone removed.  The talus was then sized at a size 2.  The size 2 cutting guide was appropriately positioned and pinned into place.  The anterior cut was made followed by milling the 2 anterior slots.  The anterior pins were removed and the posterior cut made.  The cutting guide was removed.  All waste bone was removed.  The cuts were smoothed with a curved rasp.  The trial talus was inserted and the screw used to center it.  The tibia was then measured.  The medial lateral cut was insufficient for a size 2 and a size 1 did not cover sufficiently from anterior to posterior.  The medial malleolus was cut a few more millimeters to allow appropriate width for the size 2 tibial implant.  This necessitated a prophylactic screw in the medial malleolus.  A guidepin was inserted percutaneously into the medial malleolus obliquely.  Radiographs confirmed appropriate position of the pin.  It was overdrilled and removed.  A 3.5 mm fully threaded screw was inserted.  The tibial trial was then inserted and was noted to fit appropriately.  Radiographs confirmed appropriate position and it was pinned.  The central hole was punched followed by the 3 peripheral holes.  A trial poly was inserted and was noted to fit appropriately.  The tibial lug holes were drilled.  The trials were removed.  The wound was irrigated copiously.  The tibial implant was inserted under fluoroscopic guidance and was seated appropriately.  The protective sleeve was inserted followed by the talar implant which was also  impacted into position.  A size 8 mm trial poly was inserted.  It was noted to fit appropriately with appropriate range of motion in dorsiflexion.  The trial was removed.  The final 8 mm poly was inserted after irrigating copiously with iodophor.  All implants were sprinkled with vancomycin powder before inserting.  The locking clip was then inserted and fit appropriately.  The ligaments were tested.  The lateral ligaments were noted to be incompetent.  The decision was made to proceed with lateral ligament reconstruction.  A 1.45 mm short rigid [employer] juggernaut anchor was inserted after predrilling.  Sutures were then passed through the ATFL and retinaculum.  The ankle was externally rotated and everted.  The sutures were then tied stabilizing the lateral ligaments appropriately.  Final AP, mortise and lateral radiographs confirmed appropriate position of all hardware and appropriate alignment of the ankle.  The wound was again irrigated with iodophor.  Sprinkled vancomycin powder.  The anterior joint capsule was repaired with Vicryl followed by the extensor retinaculum.  Subcutaneous tissues were approximated with 3-0 Monocryl.  The skin was closed with running subcuticular 3-0 PDS barbed suture.  Steri-Strips were applied followed by a well-padded short leg cast.  The tourniquet was  released after application of the dressings.  The patient was awakened from anesthesia and transported to the recovery room in stable condition.   FOLLOW UP PLAN: Nonweightbearing on the left lower extremity.  Xarelto for DVT prophylaxis.  Follow-up in 3 weeks for a wound check and possible conversion to a cam boot for early weightbearing pending of the incision.     Justin Ollis PA-C was present and scrubbed for the duration of the operative case. His assistance was essential in positioning the patient, prepping and draping, gaining and maintaining exposure, performing the operation, closing and dressing the wounds  and applying the splint.

## 2024-08-23 NOTE — Anesthesia Procedure Notes (Signed)
 Anesthesia Regional Block: Adductor canal block   Pre-Anesthetic Checklist: , timeout performed,  Correct Patient, Correct Site, Correct Laterality,  Correct Procedure, Correct Position, site marked,  Risks and benefits discussed,  Surgical consent,  Pre-op evaluation,  At surgeon's request and post-op pain management  Laterality: Lower and Left  Prep: chloraprep       Needles:  Injection technique: Single-shot  Needle Type: Echogenic Needle     Needle Length: 9cm  Needle Gauge: 22     Additional Needles:   Procedures:,,,, ultrasound used (permanent image in chart),,    Narrative:  Start time: 08/23/2024 8:31 AM End time: 08/23/2024 8:33 AM Injection made incrementally with aspirations every 5 mL.  Performed by: Personally  Anesthesiologist: Jefm Garnette LABOR, MD  Additional Notes: Block assessed prior to surgery. Pt tolerated procedure well.

## 2024-08-23 NOTE — Progress Notes (Addendum)
 Talked with Eva PA regarding pt taking pradaxa and elequis, pt informed just to take pradaxa and not take elequis due to this per PA.   Update: Patient stated she is confident she is not taking pradaxa, denies being on blood thinners. Son stated he will double check. Pt informed to take elequis as long as not on pradaxa.

## 2024-08-23 NOTE — Discharge Instructions (Addendum)
 Hannah Armor, MD [facility]  Please read the following information regarding your care after surgery.  Medications  You only need a prescription for the narcotic pain medicine (ex. oxycodone, Percocet, Norco).  All of the other medicines listed below are available over the counter. ? Aleve 2 pills twice a day for the first 3 days after surgery. ? acetominophen (Tylenol ) 650 mg every 4-6 hours as you need for minor to moderate pain ? oxycodone as prescribed for severe pain  Narcotic pain medicine (ex. oxycodone, Percocet, Vicodin) will cause constipation.  To prevent this problem, take the following medicines while you are taking any pain medicine. ? docusate sodium (Colace) 100 mg twice a day ? senna (Senokot) 2 tablets twice a day  ? To help prevent blood clots, take xarelto as prescribed for two weeks after surgery.  You should also get up every hour while you are awake to move around.    Weight Bearing ? Do not bear any weight on the operated leg or foot.  Cast / Splint / Dressing ? Keep your splint, cast or dressing clean and dry.  Don't put anything (coat hanger, pencil, etc) down inside of it.  If it gets damp, use a hair dryer on the cool setting to dry it.  If it gets soaked, call the office to schedule an appointment for a cast change.   After your dressing, cast or splint is removed; you may shower, but do not soak or scrub the wound.  Allow the water to run over it, and then gently pat it dry.  Swelling It is normal for you to have swelling where you had surgery.  To reduce swelling and pain, keep your toes above your nose for at least 3 days after surgery.  It may be necessary to keep your foot or leg elevated for several weeks.  If it hurts, it should be elevated.  Follow Up Call my office at [phone] when you are discharged from the hospital or surgery center to schedule an appointment to be seen 3 weeks after surgery.  Call my office at [phone] if you develop a  fever >101.5 F, nausea, vomiting, bleeding from the surgical site or severe pain.       Post Anesthesia Home Care Instructions  Activity: Get plenty of rest for the remainder of the day. A responsible individual must stay with you for 24 hours following the procedure.  For the next 24 hours, DO NOT: -Drive a car -Advertising copywriter -Drink alcoholic beverages -Take any medication unless instructed by your physician -Make any legal decisions or sign important papers.  Meals: Start with liquid foods such as gelatin or soup. Progress to regular foods as tolerated. Avoid greasy, spicy, heavy foods. If nausea and/or vomiting occur, drink only clear liquids until the nausea and/or vomiting subsides. Call your physician if vomiting continues.  Special Instructions/Symptoms: Your throat may feel dry or sore from the anesthesia or the breathing tube placed in your throat during surgery. If this causes discomfort, gargle with warm salt water. The discomfort should disappear within 24 hours.  If you had a scopolamine patch placed behind your ear for the management of post- operative nausea and/or vomiting:  1. The medication in the patch is effective for 72 hours, after which it should be removed.  Wrap patch in a tissue and discard in the trash. Wash hands thoroughly with soap and water. 2. You may remove the patch earlier than 72 hours if you experience unpleasant side effects  which may include dry mouth, dizziness or visual disturbances. 3. Avoid touching the patch. Wash your hands with soap and water after contact with the patch.     Regional Anesthesia Blocks  1. You may not be able to move or feel the blocked extremity after a regional anesthetic block. This may last may last from 3-48 hours after placement, but it will go away. The length of time depends on the medication injected and your individual response to the medication. As the nerves start to wake up, you may experience tingling as  the movement and feeling returns to your extremity. If the numbness and inability to move your extremity has not gone away after 48 hours, please call your surgeon.   2. The extremity that is blocked will need to be protected until the numbness is gone and the strength has returned. Because you cannot feel it, you will need to take extra care to avoid injury. Because it may be weak, you may have difficulty moving it or using it. You may not know what position it is in without looking at it while the block is in effect.  3. For blocks in the legs and feet, returning to weight bearing and walking needs to be done carefully. You will need to wait until the numbness is entirely gone and the strength has returned. You should be able to move your leg and foot normally before you try and bear weight or walk. You will need someone to be with you when you first try to ensure you do not fall and possibly risk injury.  4. Bruising and tenderness at the needle site are common side effects and will resolve in a few days.  5. Persistent numbness or new problems with movement should be communicated to the surgeon or the [facility] [phone] [facility] [phone]).

## 2024-08-23 NOTE — Anesthesia Procedure Notes (Signed)
 Anesthesia Regional Block: Popliteal block   Pre-Anesthetic Checklist: , timeout performed,  Correct Patient, Correct Site, Correct Laterality,  Correct Procedure, Correct Position, site marked,  Risks and benefits discussed,  Pre-op evaluation,  At surgeon's request and post-op pain management  Laterality: Lower and Left  Prep: Maximum Sterile Barrier Precautions used, chloraprep       Needles:  Injection technique: Single-shot  Needle Type: Echogenic Needle     Needle Length: 9cm  Needle Gauge: 21     Additional Needles:   Procedures:,,,, ultrasound used (permanent image in chart),,    Narrative:  Start time: 08/23/2024 8:25 AM End time: 08/23/2024 8:30 AM Injection made incrementally with aspirations every 5 mL.  Performed by: Personally  Anesthesiologist: Jefm Garnette LABOR, MD  Additional Notes: Block assessed. Patient tolerated procedure well.

## 2024-08-27 ENCOUNTER — Encounter (HOSPITAL_BASED_OUTPATIENT_CLINIC_OR_DEPARTMENT_OTHER): Payer: Self-pay | Admitting: Orthopedic Surgery

## 2024-12-04 ENCOUNTER — Encounter: Payer: Self-pay | Admitting: Family Medicine

## 2024-12-04 DIAGNOSIS — Z1231 Encounter for screening mammogram for malignant neoplasm of breast: Secondary | ICD-10-CM
